# Patient Record
Sex: Male | Born: 1950 | Race: White | Hispanic: No | Marital: Married | Smoking: Former smoker
Health system: Southern US, Community
[De-identification: ages and names within clinical notes are randomized; demographics above are authoritative.]

## PROBLEM LIST (undated history)

## (undated) DIAGNOSIS — K922 Gastrointestinal hemorrhage, unspecified: Secondary | ICD-10-CM

## (undated) DIAGNOSIS — E039 Hypothyroidism, unspecified: Secondary | ICD-10-CM

## (undated) DIAGNOSIS — I10 Essential (primary) hypertension: Secondary | ICD-10-CM

## (undated) DIAGNOSIS — J449 Chronic obstructive pulmonary disease, unspecified: Secondary | ICD-10-CM

## (undated) DIAGNOSIS — E119 Type 2 diabetes mellitus without complications: Secondary | ICD-10-CM

## (undated) DIAGNOSIS — K659 Peritonitis, unspecified: Secondary | ICD-10-CM

## (undated) DIAGNOSIS — J939 Pneumothorax, unspecified: Secondary | ICD-10-CM

## (undated) DIAGNOSIS — I499 Cardiac arrhythmia, unspecified: Secondary | ICD-10-CM

## (undated) DIAGNOSIS — M329 Systemic lupus erythematosus, unspecified: Secondary | ICD-10-CM

## (undated) DIAGNOSIS — L089 Local infection of the skin and subcutaneous tissue, unspecified: Secondary | ICD-10-CM

## (undated) DIAGNOSIS — K7460 Unspecified cirrhosis of liver: Secondary | ICD-10-CM

## (undated) DIAGNOSIS — I82409 Acute embolism and thrombosis of unspecified deep veins of unspecified lower extremity: Secondary | ICD-10-CM

## (undated) DIAGNOSIS — E785 Hyperlipidemia, unspecified: Secondary | ICD-10-CM

## (undated) DIAGNOSIS — D649 Anemia, unspecified: Secondary | ICD-10-CM

## (undated) HISTORY — PX: APPENDECTOMY: SHX54

## (undated) HISTORY — PX: COLONOSCOPY: SHX174

## (undated) HISTORY — PX: HERNIA REPAIR: SHX51

---

## 2011-02-27 ENCOUNTER — Inpatient Hospital Stay: Payer: Self-pay | Admitting: Internal Medicine

## 2011-02-27 DIAGNOSIS — K922 Gastrointestinal hemorrhage, unspecified: Secondary | ICD-10-CM

## 2011-03-01 DIAGNOSIS — R000 Tachycardia, unspecified: Secondary | ICD-10-CM

## 2015-08-21 DIAGNOSIS — K219 Gastro-esophageal reflux disease without esophagitis: Secondary | ICD-10-CM | POA: Insufficient documentation

## 2015-08-21 DIAGNOSIS — F329 Major depressive disorder, single episode, unspecified: Secondary | ICD-10-CM | POA: Insufficient documentation

## 2015-08-21 DIAGNOSIS — M545 Low back pain, unspecified: Secondary | ICD-10-CM | POA: Insufficient documentation

## 2015-08-21 DIAGNOSIS — G8929 Other chronic pain: Secondary | ICD-10-CM | POA: Insufficient documentation

## 2015-08-21 DIAGNOSIS — F32A Depression, unspecified: Secondary | ICD-10-CM | POA: Insufficient documentation

## 2015-08-21 DIAGNOSIS — G250 Essential tremor: Secondary | ICD-10-CM | POA: Insufficient documentation

## 2016-03-26 DIAGNOSIS — Z0289 Encounter for other administrative examinations: Secondary | ICD-10-CM | POA: Insufficient documentation

## 2016-07-16 DIAGNOSIS — M5414 Radiculopathy, thoracic region: Secondary | ICD-10-CM | POA: Insufficient documentation

## 2016-07-16 DIAGNOSIS — F3341 Major depressive disorder, recurrent, in partial remission: Secondary | ICD-10-CM | POA: Insufficient documentation

## 2016-07-16 HISTORY — DX: Radiculopathy, thoracic region: M54.14

## 2017-02-01 ENCOUNTER — Encounter: Payer: Self-pay | Admitting: Emergency Medicine

## 2017-02-01 ENCOUNTER — Emergency Department
Admission: EM | Admit: 2017-02-01 | Discharge: 2017-02-01 | Disposition: A | Discharge status: Home / Self Care | Payer: No Typology Code available for payment source | Attending: Emergency Medicine | Admitting: Emergency Medicine

## 2017-02-01 ENCOUNTER — Emergency Department: Payer: No Typology Code available for payment source

## 2017-02-01 DIAGNOSIS — Y999 Unspecified external cause status: Secondary | ICD-10-CM | POA: Insufficient documentation

## 2017-02-01 DIAGNOSIS — Y92410 Unspecified street and highway as the place of occurrence of the external cause: Secondary | ICD-10-CM | POA: Diagnosis not present

## 2017-02-01 DIAGNOSIS — Y939 Activity, unspecified: Secondary | ICD-10-CM | POA: Diagnosis not present

## 2017-02-01 DIAGNOSIS — S161XXA Strain of muscle, fascia and tendon at neck level, initial encounter: Secondary | ICD-10-CM | POA: Insufficient documentation

## 2017-02-01 DIAGNOSIS — S63630A Sprain of interphalangeal joint of right index finger, initial encounter: Secondary | ICD-10-CM | POA: Diagnosis not present

## 2017-02-01 DIAGNOSIS — E119 Type 2 diabetes mellitus without complications: Secondary | ICD-10-CM | POA: Insufficient documentation

## 2017-02-01 DIAGNOSIS — J449 Chronic obstructive pulmonary disease, unspecified: Secondary | ICD-10-CM | POA: Insufficient documentation

## 2017-02-01 DIAGNOSIS — S199XXA Unspecified injury of neck, initial encounter: Secondary | ICD-10-CM | POA: Diagnosis present

## 2017-02-01 HISTORY — DX: Type 2 diabetes mellitus without complications: E11.9

## 2017-02-01 HISTORY — DX: Chronic obstructive pulmonary disease, unspecified: J44.9

## 2017-02-01 HISTORY — DX: Pneumothorax, unspecified: J93.9

## 2017-02-01 HISTORY — DX: Gastrointestinal hemorrhage, unspecified: K92.2

## 2017-02-01 MED ORDER — BACITRACIN ZINC 500 UNIT/GM EX OINT: TOPICAL_OINTMENT | Freq: Once | CUTANEOUS | Status: DC

## 2017-02-01 NOTE — ED Notes (Signed)
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
## 2017-07-26 DIAGNOSIS — M519 Unspecified thoracic, thoracolumbar and lumbosacral intervertebral disc disorder: Secondary | ICD-10-CM | POA: Insufficient documentation

## 2017-07-26 DIAGNOSIS — Z8619 Personal history of other infectious and parasitic diseases: Secondary | ICD-10-CM | POA: Insufficient documentation

## 2017-08-06 ENCOUNTER — Other Ambulatory Visit: Payer: Self-pay | Admitting: Internal Medicine

## 2017-08-06 DIAGNOSIS — M48061 Spinal stenosis, lumbar region without neurogenic claudication: Secondary | ICD-10-CM

## 2017-08-14 ENCOUNTER — Ambulatory Visit: Payer: PRIVATE HEALTH INSURANCE

## 2018-07-30 DIAGNOSIS — E1142 Type 2 diabetes mellitus with diabetic polyneuropathy: Secondary | ICD-10-CM

## 2018-08-29 ENCOUNTER — Emergency Department: Payer: Medicare Other

## 2018-08-29 ENCOUNTER — Other Ambulatory Visit: Payer: Self-pay

## 2018-08-29 ENCOUNTER — Inpatient Hospital Stay
Admission: EM | Admit: 2018-08-29 | Discharge: 2018-09-03 | DRG: 175 | Disposition: A | Discharge status: Home / Self Care | Payer: Medicare Other | Attending: Internal Medicine | Admitting: Internal Medicine

## 2018-08-29 DIAGNOSIS — Y906 Blood alcohol level of 120-199 mg/100 ml: Secondary | ICD-10-CM | POA: Diagnosis present

## 2018-08-29 DIAGNOSIS — K766 Portal hypertension: Secondary | ICD-10-CM | POA: Diagnosis present

## 2018-08-29 DIAGNOSIS — I82441 Acute embolism and thrombosis of right tibial vein: Secondary | ICD-10-CM | POA: Diagnosis present

## 2018-08-29 DIAGNOSIS — R7989 Other specified abnormal findings of blood chemistry: Secondary | ICD-10-CM | POA: Diagnosis present

## 2018-08-29 DIAGNOSIS — K7460 Unspecified cirrhosis of liver: Secondary | ICD-10-CM | POA: Diagnosis present

## 2018-08-29 DIAGNOSIS — W19XXXA Unspecified fall, initial encounter: Secondary | ICD-10-CM | POA: Diagnosis present

## 2018-08-29 DIAGNOSIS — I2510 Atherosclerotic heart disease of native coronary artery without angina pectoris: Secondary | ICD-10-CM | POA: Diagnosis present

## 2018-08-29 DIAGNOSIS — I6523 Occlusion and stenosis of bilateral carotid arteries: Secondary | ICD-10-CM | POA: Diagnosis present

## 2018-08-29 DIAGNOSIS — I82411 Acute embolism and thrombosis of right femoral vein: Secondary | ICD-10-CM | POA: Diagnosis present

## 2018-08-29 DIAGNOSIS — R296 Repeated falls: Secondary | ICD-10-CM | POA: Diagnosis present

## 2018-08-29 DIAGNOSIS — Z7982 Long term (current) use of aspirin: Secondary | ICD-10-CM

## 2018-08-29 DIAGNOSIS — J90 Pleural effusion, not elsewhere classified: Secondary | ICD-10-CM | POA: Diagnosis present

## 2018-08-29 DIAGNOSIS — I951 Orthostatic hypotension: Secondary | ICD-10-CM | POA: Diagnosis present

## 2018-08-29 DIAGNOSIS — Z79891 Long term (current) use of opiate analgesic: Secondary | ICD-10-CM

## 2018-08-29 DIAGNOSIS — I82431 Acute embolism and thrombosis of right popliteal vein: Secondary | ICD-10-CM | POA: Diagnosis present

## 2018-08-29 DIAGNOSIS — I480 Paroxysmal atrial fibrillation: Secondary | ICD-10-CM | POA: Diagnosis present

## 2018-08-29 DIAGNOSIS — E860 Dehydration: Secondary | ICD-10-CM | POA: Diagnosis present

## 2018-08-29 DIAGNOSIS — Z6841 Body Mass Index (BMI) 40.0 and over, adult: Secondary | ICD-10-CM

## 2018-08-29 DIAGNOSIS — Z7289 Other problems related to lifestyle: Secondary | ICD-10-CM

## 2018-08-29 DIAGNOSIS — I959 Hypotension, unspecified: Secondary | ICD-10-CM | POA: Diagnosis present

## 2018-08-29 DIAGNOSIS — R000 Tachycardia, unspecified: Secondary | ICD-10-CM

## 2018-08-29 DIAGNOSIS — Z79890 Hormone replacement therapy (postmenopausal): Secondary | ICD-10-CM

## 2018-08-29 DIAGNOSIS — R161 Splenomegaly, not elsewhere classified: Secondary | ICD-10-CM | POA: Diagnosis present

## 2018-08-29 DIAGNOSIS — Z79899 Other long term (current) drug therapy: Secondary | ICD-10-CM

## 2018-08-29 DIAGNOSIS — I2699 Other pulmonary embolism without acute cor pulmonale: Principal | ICD-10-CM | POA: Diagnosis present

## 2018-08-29 DIAGNOSIS — Z23 Encounter for immunization: Secondary | ICD-10-CM

## 2018-08-29 DIAGNOSIS — R55 Syncope and collapse: Secondary | ICD-10-CM | POA: Diagnosis present

## 2018-08-29 DIAGNOSIS — E1142 Type 2 diabetes mellitus with diabetic polyneuropathy: Secondary | ICD-10-CM

## 2018-08-29 DIAGNOSIS — E872 Acidosis: Secondary | ICD-10-CM | POA: Diagnosis present

## 2018-08-29 DIAGNOSIS — I824Y1 Acute embolism and thrombosis of unspecified deep veins of right proximal lower extremity: Secondary | ICD-10-CM

## 2018-08-29 DIAGNOSIS — J9601 Acute respiratory failure with hypoxia: Secondary | ICD-10-CM | POA: Diagnosis present

## 2018-08-29 DIAGNOSIS — J449 Chronic obstructive pulmonary disease, unspecified: Secondary | ICD-10-CM | POA: Diagnosis present

## 2018-08-29 DIAGNOSIS — J9811 Atelectasis: Secondary | ICD-10-CM | POA: Diagnosis present

## 2018-08-29 DIAGNOSIS — D696 Thrombocytopenia, unspecified: Secondary | ICD-10-CM

## 2018-08-29 DIAGNOSIS — D6959 Other secondary thrombocytopenia: Secondary | ICD-10-CM | POA: Diagnosis present

## 2018-08-29 DIAGNOSIS — Z789 Other specified health status: Secondary | ICD-10-CM

## 2018-08-29 DIAGNOSIS — Z9981 Dependence on supplemental oxygen: Secondary | ICD-10-CM

## 2018-08-29 DIAGNOSIS — Z87891 Personal history of nicotine dependence: Secondary | ICD-10-CM

## 2018-08-29 DIAGNOSIS — E039 Hypothyroidism, unspecified: Secondary | ICD-10-CM | POA: Diagnosis present

## 2018-08-29 DIAGNOSIS — E119 Type 2 diabetes mellitus without complications: Secondary | ICD-10-CM | POA: Diagnosis present

## 2018-08-29 DIAGNOSIS — Z794 Long term (current) use of insulin: Secondary | ICD-10-CM

## 2018-08-29 LAB — CBC
HCT: 39.7 % (ref 39.0–52.0)
HEMOGLOBIN: 13.2 g/dL (ref 13.0–17.0)
MCH: 31.8 pg (ref 26.0–34.0)
MCHC: 33.2 g/dL (ref 30.0–36.0)
MCV: 95.7 fL (ref 80.0–100.0)
NRBC: 0 % (ref 0.0–0.2)
Platelets: 97 10*3/uL — ABNORMAL LOW (ref 150–400)
RBC: 4.15 MIL/uL — ABNORMAL LOW (ref 4.22–5.81)
RDW: 14.5 % (ref 11.5–15.5)
WBC: 7.9 10*3/uL (ref 4.0–10.5)

## 2018-08-29 LAB — COMPREHENSIVE METABOLIC PANEL
ALBUMIN: 2.6 g/dL — AB (ref 3.5–5.0)
ALT: 18 U/L (ref 0–44)
AST: 47 U/L — ABNORMAL HIGH (ref 15–41)
Alkaline Phosphatase: 107 U/L (ref 38–126)
Anion gap: 11 (ref 5–15)
BUN: 13 mg/dL (ref 8–23)
CO2: 20 mmol/L — AB (ref 22–32)
Calcium: 7.9 mg/dL — ABNORMAL LOW (ref 8.9–10.3)
Chloride: 102 mmol/L (ref 98–111)
Creatinine, Ser: 0.88 mg/dL (ref 0.61–1.24)
GFR calc Af Amer: >60 mL/min (ref >60)
GFR calc non Af Amer: >60 mL/min (ref >60)
Glucose, Bld: 124 mg/dL — ABNORMAL HIGH (ref 70–99)
Potassium: 4.3 mmol/L (ref 3.5–5.1)
Sodium: 133 mmol/L — ABNORMAL LOW (ref 135–145)
Total Bilirubin: 1.5 mg/dL — ABNORMAL HIGH (ref 0.3–1.2)
Total Protein: 6 g/dL — ABNORMAL LOW (ref 6.5–8.1)

## 2018-08-29 LAB — LIPASE, BLOOD: Lipase: 39 U/L (ref 11–51)

## 2018-08-29 LAB — BLOOD GAS, VENOUS
Acid-base deficit: 3.1 mmol/L — ABNORMAL HIGH (ref 0.0–2.0)
Bicarbonate: 23.2 mmol/L (ref 20.0–28.0)
O2 Saturation: 91.6 %
PO2 VEN: 68 mmHg — AB (ref 32.0–45.0)
Patient temperature: 37
pCO2, Ven: 45 mmHg (ref 44.0–60.0)
pH, Ven: 7.32 (ref 7.250–7.430)

## 2018-08-29 LAB — LACTIC ACID, PLASMA: Lactic Acid, Venous: 2.6 mmol/L (ref 0.5–1.9)

## 2018-08-29 LAB — ETHANOL: Alcohol, Ethyl (B): 163 mg/dL — ABNORMAL HIGH (ref <10)

## 2018-08-29 LAB — TROPONIN I: Troponin I: <0.03 ng/mL (ref <0.03)

## 2018-08-29 MED ORDER — SODIUM CHLORIDE 0.9 % IV BOLUS
1000.0000 mL | Freq: Once | INTRAVENOUS | Status: AC
  Administered 2018-08-29: 1000 mL via INTRAVENOUS

## 2018-08-29 MED ORDER — SODIUM CHLORIDE 0.9 % IV BOLUS
500.0000 mL | Freq: Once | INTRAVENOUS | Status: AC
  Administered 2018-08-29: 500 mL via INTRAVENOUS

## 2018-08-29 MED ORDER — IOPAMIDOL (ISOVUE-300) INJECTION 61%
100.0000 mL | Freq: Once | INTRAVENOUS | Status: AC | PRN
  Administered 2018-08-29: 100 mL via INTRAVENOUS

## 2018-08-29 MED ORDER — ONDANSETRON HCL 4 MG/2ML IJ SOLN
4.0000 mg | Freq: Once | INTRAMUSCULAR | Status: AC
  Administered 2018-08-29: 4 mg via INTRAVENOUS
  Filled 2018-08-29: qty 2

## 2018-08-29 NOTE — ED Triage Notes (Addendum)
Pt arrived via [facility] from home with c/o fall and SOB. EMS states that pt started trulicity about a month ago. EMS states the last couple of days and fell down 4 times in the last couple of days. Pt states he has had some N/V occasionally.

## 2018-08-29 NOTE — ED Notes (Signed)
Patient transported to CT 

## 2018-08-29 NOTE — ED Provider Notes (Signed)
[facility] Emergency Department Provider Note  ____________________________________________  Time seen: Approximately 9:35 PM  I have reviewed the triage vital signs and the nursing notes.   HISTORY  Chief Complaint Fall and Shortness of Breath    HPI Lawrence Osborn is a 67 y.o. male with a history of COPD, not on oxygen, DM 2, presenting for 1 month of progressive decline with fall tonight.  The patient reports that for the past month, he has been feeling generalized weakness with daily nausea but no vomiting.  He occasionally has right upper quadrant pain that is unchanged with eating.  He has not had any diarrhea or constipation, urinary symptoms.  He has had a progressive decline with decreased exercise tolerance, including multiple falls due to bilateral lower extremities "giving out" on him.  Tonight, the patient had a fall, so came in for evaluation.  He denies any chest pain but has had shortness of breath.  No palpitations, lightheadedness or syncope.  He reports drinking 2 beers daily.  His wife also reports that he has slurred speech tonight.  Past Medical History:  Diagnosis Date  . COPD (chronic obstructive pulmonary disease) (HCC)   . Diabetes mellitus without complication (HCC)   . GI bleed   . Pneumothorax     There are no active problems to display for this patient.   Past Surgical History:  Procedure Laterality Date  . APPENDECTOMY    . HERNIA REPAIR        Allergies Patient has no known allergies.  History reviewed. No pertinent family history.  Social History Social History   Tobacco Use  . Smoking status: Never Smoker  . Smokeless tobacco: Never Used  Substance Use Topics  . Alcohol use: Yes    Comment: occ  . Drug use: Not on file    Review of Systems Constitutional: No fever/chills.  No lightheadedness or syncope.  Positive recurrent falls.  Positive general malaise. Eyes: No visual changes. ENT: No sore throat. No  congestion or rhinorrhea. Cardiovascular: Denies chest pain. Denies palpitations. Respiratory: Positive shortness of breath.  No cough. Gastrointestinal: Acid right upper quadrant abdominal pain.  Positive nausea, no vomiting.  No diarrhea.  No constipation. Genitourinary: Negative for dysuria. Musculoskeletal: Negative for back pain.  No neck pain. Skin: Negative for rash. Neurological: Negative for headaches. No focal numbness, tingling or weakness.  + slurred speech.    ____________________________________________   PHYSICAL EXAM:  VITAL SIGNS: ED Triage Vitals  Enc Vitals Group     BP 08/29/18 2121 (!) 85/58     Pulse Rate 08/29/18 2121 (!) 117     Resp 08/29/18 2121 20     Temp 08/29/18 2121 98.1 F (36.7 C)     Temp Source 08/29/18 2121 Oral     SpO2 08/29/18 2121 92 %     Weight 08/29/18 2122 252 lb (114.3 kg)     Height 08/29/18 2122 5\' 8"  (1.727 m)     Head Circumference --      Peak Flow --      Pain Score 08/29/18 2122 0     Pain Loc --      Pain Edu? --      Excl. in GC? --     Constitutional: Alert and oriented. Answers questions appropriately. Eyes: Conjunctivae are normal.  EOMI. positive scleral icterus. Head: Atraumatic. Nose: No congestion/rhinnorhea. Mouth/Throat: Mucous membranes are dry.  Neck: No stridor.  Supple.  No JVD.  No meningismus. Cardiovascular: Fast rate,  regular rhythm. No murmurs, rubs or gallops.  Hypotensive to 99 over 50s on my exam. Respiratory: Normal respiratory effort.  No accessory muscle use or retractions. Lungs CTAB.  No wheezes, rales or ronchi. Gastrointestinal: Obese.  Soft, and nondistended.  No tenderness to palpation, but the patient has a large palpable liver edge in the right upper quadrant.  No guarding or rebound.  No peritoneal signs. Musculoskeletal: No LE edema. No ttp in the calves or palpable cords.  Negative Homan's sign. Neurologic:  A&Ox3.  Speech is mildly slurred.  Face and smile are symmetric.  EOMI.   Moves all extremities well. Skin:  Skin is warm, dry and intact. No rash noted. Psychiatric: Mood and affect are normal. Speech and behavior are normal.  Normal judgement.  ____________________________________________   LABS (all labs ordered are listed, but only abnormal results are displayed)  Labs Reviewed  CBC  COMPREHENSIVE METABOLIC PANEL  TROPONIN I  URINALYSIS, COMPLETE (UACMP) WITH MICROSCOPIC  URINE DRUG SCREEN, QUALITATIVE ([facility] ONLY)  ETHANOL  LACTIC ACID, PLASMA  LACTIC ACID, PLASMA  LIPASE, BLOOD   ____________________________________________  EKG  ED ECG REPORT I, Anne-Caroline Sharma Covert, the attending physician, personally viewed and interpreted this ECG.   Date: 08/29/2018  EKG Time: 2112  Rate: 108  Rhythm: The computer reports this is atrial flutter; it is possible that this is sinus rhythm with a right bundle branch block.  Axis: leftward  Intervals:prolonged QTc  ST&T Change: No STEMI  ____________________________________________  RADIOLOGY  No results found.  ____________________________________________   PROCEDURES  Procedure(s) performed: None  Procedures  Critical Care performed: Yes ____________________________________________   INITIAL IMPRESSION / ASSESSMENT AND PLAN / ED COURSE  Pertinent labs & imaging results that were available during my care of the patient were reviewed by me and considered in my medical decision making (see chart for details).  67 y.o. male with a history of DM 2 and morbid obesity presenting for 1 month of progressive decline, with fall tonight.  There is no evidence for trauma from the patient's fall.  The patient has scleral icterus and mild jaundice, which is concerning for pancreatic or liver disease, and on exam he has a palpable liver edge.  The patient's EKG is also not completely normal, with either atrial flutter versus sinus rhythm with a right bundle.  I am concerned about malignancy in this patient  or an autoimmune process, although infection is possible the timeframe would be long for this.  A CT of the head and abdomen have been ordered, as well as a chest x-ray.  Basic laboratory studies, drug screens, are also pending.  The patient is hypotensive and tachycardic and will be treated with 2 L of intravenous fluids with ongoing monitoring and reevaluation.  Plan admission.  CRITICAL CARE Performed by: Rockne Menghini   Total critical care time: 35 minutes  Critical care time was exclusive of separately billable procedures and treating other patients.  Critical care was necessary to treat or prevent imminent or life-threatening deterioration.  Critical care was time spent personally by me on the following activities: development of treatment plan with patient and/or surrogate as well as nursing, discussions with consultants, evaluation of patient's response to treatment, examination of patient, obtaining history from patient or surrogate, ordering and performing treatments and interventions, ordering and review of laboratory studies, ordering and review of radiographic studies, pulse oximetry and re-evaluation of patient's condition.   ____________________________________________  FINAL CLINICAL IMPRESSION(S) / ED DIAGNOSES  Final diagnoses:  None  NEW MEDICATIONS STARTED DURING THIS VISIT:  New Prescriptions   No medications on file      Rockne Menghini, MD 08/29/18 2142

## 2018-08-30 ENCOUNTER — Observation Stay: Payer: Medicare Other

## 2018-08-30 ENCOUNTER — Observation Stay
Admit: 2018-08-30 | Discharge: 2018-08-30 | Disposition: A | Discharge status: Home / Self Care | Payer: Medicare Other | Attending: Internal Medicine | Admitting: Internal Medicine

## 2018-08-30 DIAGNOSIS — R55 Syncope and collapse: Secondary | ICD-10-CM | POA: Diagnosis present

## 2018-08-30 DIAGNOSIS — E119 Type 2 diabetes mellitus without complications: Secondary | ICD-10-CM

## 2018-08-30 DIAGNOSIS — E039 Hypothyroidism, unspecified: Secondary | ICD-10-CM | POA: Diagnosis present

## 2018-08-30 DIAGNOSIS — I2510 Atherosclerotic heart disease of native coronary artery without angina pectoris: Secondary | ICD-10-CM | POA: Diagnosis present

## 2018-08-30 DIAGNOSIS — I959 Hypotension, unspecified: Secondary | ICD-10-CM | POA: Diagnosis present

## 2018-08-30 DIAGNOSIS — Z789 Other specified health status: Secondary | ICD-10-CM

## 2018-08-30 DIAGNOSIS — Z7289 Other problems related to lifestyle: Secondary | ICD-10-CM

## 2018-08-30 DIAGNOSIS — J449 Chronic obstructive pulmonary disease, unspecified: Secondary | ICD-10-CM | POA: Insufficient documentation

## 2018-08-30 DIAGNOSIS — I480 Paroxysmal atrial fibrillation: Secondary | ICD-10-CM | POA: Diagnosis present

## 2018-08-30 HISTORY — DX: Syncope and collapse: R55

## 2018-08-30 LAB — CBC
HCT: 39.1 % (ref 39.0–52.0)
Hemoglobin: 12.5 g/dL — ABNORMAL LOW (ref 13.0–17.0)
MCH: 30.9 pg (ref 26.0–34.0)
MCHC: 32 g/dL (ref 30.0–36.0)
MCV: 96.8 fL (ref 80.0–100.0)
PLATELETS: 77 10*3/uL — AB (ref 150–400)
RBC: 4.04 MIL/uL — ABNORMAL LOW (ref 4.22–5.81)
RDW: 14.6 % (ref 11.5–15.5)
WBC: 4.4 10*3/uL (ref 4.0–10.5)
nRBC: 0 % (ref 0.0–0.2)

## 2018-08-30 LAB — URINALYSIS, COMPLETE (UACMP) WITH MICROSCOPIC
BILIRUBIN URINE: NEGATIVE
Bacteria, UA: NONE SEEN
Glucose, UA: NEGATIVE mg/dL
Hgb urine dipstick: NEGATIVE
Ketones, ur: NEGATIVE mg/dL
Leukocytes, UA: NEGATIVE
Nitrite: NEGATIVE
Protein, ur: NEGATIVE mg/dL
Specific Gravity, Urine: 1.018 (ref 1.005–1.030)
pH: 6 (ref 5.0–8.0)

## 2018-08-30 LAB — URINE DRUG SCREEN, QUALITATIVE (ARMC ONLY)
Amphetamines, Ur Screen: NOT DETECTED
BARBITURATES, UR SCREEN: NOT DETECTED
BENZODIAZEPINE, UR SCRN: NOT DETECTED
Cannabinoid 50 Ng, Ur ~~LOC~~: NOT DETECTED
Cocaine Metabolite,Ur ~~LOC~~: NOT DETECTED
MDMA (Ecstasy)Ur Screen: NOT DETECTED
Methadone Scn, Ur: NOT DETECTED
Opiate, Ur Screen: NOT DETECTED
Phencyclidine (PCP) Ur S: NOT DETECTED
Tricyclic, Ur Screen: POSITIVE — AB

## 2018-08-30 LAB — BASIC METABOLIC PANEL
ANION GAP: 8 (ref 5–15)
BUN: 11 mg/dL (ref 8–23)
CO2: 24 mmol/L (ref 22–32)
Calcium: 8 mg/dL — ABNORMAL LOW (ref 8.9–10.3)
Chloride: 106 mmol/L (ref 98–111)
Creatinine, Ser: 0.74 mg/dL (ref 0.61–1.24)
GFR calc Af Amer: >60 mL/min (ref >60)
GFR calc non Af Amer: >60 mL/min (ref >60)
Glucose, Bld: 105 mg/dL — ABNORMAL HIGH (ref 70–99)
Potassium: 4.4 mmol/L (ref 3.5–5.1)
Sodium: 138 mmol/L (ref 135–145)

## 2018-08-30 LAB — GLUCOSE, CAPILLARY
Glucose-Capillary: 71 mg/dL (ref 70–99)
Glucose-Capillary: 160 mg/dL — ABNORMAL HIGH (ref 70–99)
Glucose-Capillary: 117 mg/dL — ABNORMAL HIGH (ref 70–99)
Glucose-Capillary: 104 mg/dL — ABNORMAL HIGH (ref 70–99)
Glucose-Capillary: 135 mg/dL — ABNORMAL HIGH (ref 70–99)

## 2018-08-30 LAB — ECHOCARDIOGRAM COMPLETE
Height: 68 in
Weight: 4032 oz

## 2018-08-30 LAB — LACTIC ACID, PLASMA
LACTIC ACID, VENOUS: 2.2 mmol/L — AB (ref 0.5–1.9)
Lactic Acid, Venous: 3.1 mmol/L (ref 0.5–1.9)
Lactic Acid, Venous: 1.9 mmol/L (ref 0.5–1.9)
Lactic Acid, Venous: 1.5 mmol/L (ref 0.5–1.9)

## 2018-08-30 MED ORDER — OMEGA-3 FISH OIL 1000 MG PO CAPS: 1.0000 | ORAL_CAPSULE | Freq: Two times a day (BID) | ORAL | Status: DC

## 2018-08-30 MED ORDER — ONDANSETRON HCL 4 MG/2ML IJ SOLN: 4.0000 mg | Freq: Four times a day (QID) | INTRAMUSCULAR | Status: DC | PRN

## 2018-08-30 MED ORDER — TRAMADOL HCL 50 MG PO TABS
50.0000 mg | ORAL_TABLET | Freq: Two times a day (BID) | ORAL | Status: DC
  Administered 2018-09-03: 50 mg via ORAL
  Filled 2018-08-30 (×6): qty 1

## 2018-08-30 MED ORDER — OMEGA-3-ACID ETHYL ESTERS 1 G PO CAPS
1.0000 g | ORAL_CAPSULE | Freq: Two times a day (BID) | ORAL | Status: DC
  Administered 2018-08-30 – 2018-09-03 (×9): 1 g via ORAL
  Filled 2018-08-30 (×9): qty 1

## 2018-08-30 MED ORDER — SODIUM CHLORIDE 0.9 % IV SOLN: INTRAVENOUS | Status: DC

## 2018-08-30 MED ORDER — ORAL CARE MOUTH RINSE
15.0000 mL | Freq: Two times a day (BID) | OROMUCOSAL | Status: DC
  Administered 2018-08-30 – 2018-09-03 (×5): 15 mL via OROMUCOSAL

## 2018-08-30 MED ORDER — ACETAMINOPHEN 325 MG PO TABS: 650.0000 mg | ORAL_TABLET | Freq: Four times a day (QID) | ORAL | Status: DC | PRN

## 2018-08-30 MED ORDER — LORAZEPAM 2 MG PO TABS: 0.0000 mg | ORAL_TABLET | Freq: Two times a day (BID) | ORAL | Status: DC

## 2018-08-30 MED ORDER — FOLIC ACID 1 MG PO TABS
1.0000 mg | ORAL_TABLET | Freq: Every day | ORAL | Status: DC
  Administered 2018-08-30 – 2018-09-03 (×5): 1 mg via ORAL
  Filled 2018-08-30 (×5): qty 1

## 2018-08-30 MED ORDER — LORAZEPAM 1 MG PO TABS: 1.0000 mg | ORAL_TABLET | Freq: Four times a day (QID) | ORAL | Status: DC | PRN

## 2018-08-30 MED ORDER — DIGOXIN 125 MCG PO TABS
0.1250 mg | ORAL_TABLET | Freq: Every day | ORAL | Status: DC
  Administered 2018-08-30 – 2018-09-03 (×5): 0.125 mg via ORAL
  Filled 2018-08-30 (×6): qty 1

## 2018-08-30 MED ORDER — QUETIAPINE FUMARATE 25 MG PO TABS
100.0000 mg | ORAL_TABLET | Freq: Every morning | ORAL | Status: DC
  Administered 2018-08-31 – 2018-09-03 (×4): 100 mg via ORAL
  Filled 2018-08-30 (×4): qty 4

## 2018-08-30 MED ORDER — SODIUM CHLORIDE 0.9 % IV BOLUS: 1000.0000 mL | Freq: Once | INTRAVENOUS | Status: DC

## 2018-08-30 MED ORDER — QUETIAPINE FUMARATE 25 MG PO TABS
200.0000 mg | ORAL_TABLET | Freq: Every day | ORAL | Status: DC
  Administered 2018-08-30 – 2018-09-02 (×4): 200 mg via ORAL
  Filled 2018-08-30 (×4): qty 8

## 2018-08-30 MED ORDER — ONDANSETRON HCL 4 MG PO TABS: 4.0000 mg | ORAL_TABLET | Freq: Four times a day (QID) | ORAL | Status: DC | PRN

## 2018-08-30 MED ORDER — LORAZEPAM 2 MG/ML IJ SOLN: 1.0000 mg | Freq: Four times a day (QID) | INTRAMUSCULAR | Status: DC | PRN

## 2018-08-30 MED ORDER — NADOLOL 40 MG PO TABS
80.0000 mg | ORAL_TABLET | Freq: Two times a day (BID) | ORAL | Status: DC
  Administered 2018-08-30: 80 mg via ORAL
  Filled 2018-08-30 (×3): qty 2

## 2018-08-30 MED ORDER — ACETAMINOPHEN 650 MG RE SUPP: 650.0000 mg | Freq: Four times a day (QID) | RECTAL | Status: DC | PRN

## 2018-08-30 MED ORDER — LORAZEPAM 2 MG PO TABS: 0.0000 mg | ORAL_TABLET | Freq: Four times a day (QID) | ORAL | Status: DC

## 2018-08-30 MED ORDER — QUETIAPINE FUMARATE 25 MG PO TABS: 100.0000 mg | ORAL_TABLET | Freq: Two times a day (BID) | ORAL | Status: DC

## 2018-08-30 MED ORDER — INSULIN ASPART 100 UNIT/ML ~~LOC~~ SOLN
0.0000 [IU] | Freq: Every day | SUBCUTANEOUS | Status: DC
  Administered 2018-08-31: 2 [IU] via SUBCUTANEOUS
  Filled 2018-08-30: qty 1

## 2018-08-30 MED ORDER — SODIUM CHLORIDE 0.9 % IV BOLUS
500.0000 mL | Freq: Once | INTRAVENOUS | Status: AC
  Administered 2018-08-30: 500 mL via INTRAVENOUS

## 2018-08-30 MED ORDER — PANTOPRAZOLE SODIUM 40 MG PO TBEC
40.0000 mg | DELAYED_RELEASE_TABLET | Freq: Every day | ORAL | Status: DC
  Administered 2018-08-30 – 2018-09-03 (×5): 40 mg via ORAL
  Filled 2018-08-30 (×5): qty 1

## 2018-08-30 MED ORDER — ADULT MULTIVITAMIN W/MINERALS CH
1.0000 | ORAL_TABLET | Freq: Every day | ORAL | Status: DC
  Administered 2018-09-02 – 2018-09-03 (×2): 1 via ORAL
  Filled 2018-08-30 (×3): qty 1

## 2018-08-30 MED ORDER — ASPIRIN EC 81 MG PO TBEC
81.0000 mg | DELAYED_RELEASE_TABLET | Freq: Every day | ORAL | Status: DC
  Administered 2018-08-30 – 2018-09-02 (×4): 81 mg via ORAL
  Filled 2018-08-30 (×4): qty 1

## 2018-08-30 MED ORDER — ENOXAPARIN SODIUM 40 MG/0.4ML ~~LOC~~ SOLN
40.0000 mg | SUBCUTANEOUS | Status: DC
  Filled 2018-08-30: qty 0.4

## 2018-08-30 MED ORDER — PNEUMOCOCCAL VAC POLYVALENT 25 MCG/0.5ML IJ INJ
0.5000 mL | INJECTION | INTRAMUSCULAR | Status: AC
  Administered 2018-09-01: 0.5 mL via INTRAMUSCULAR
  Filled 2018-08-30: qty 0.5

## 2018-08-30 MED ORDER — LEVOTHYROXINE SODIUM 100 MCG PO TABS
100.0000 ug | ORAL_TABLET | Freq: Every day | ORAL | Status: DC
  Administered 2018-08-30 – 2018-09-03 (×5): 100 ug via ORAL
  Filled 2018-08-30 (×5): qty 1

## 2018-08-30 MED ORDER — VITAMIN B-1 100 MG PO TABS
100.0000 mg | ORAL_TABLET | Freq: Every day | ORAL | Status: DC
  Administered 2018-08-30 – 2018-09-02 (×4): 100 mg via ORAL
  Filled 2018-08-30 (×4): qty 1

## 2018-08-30 MED ORDER — INSULIN ASPART 100 UNIT/ML ~~LOC~~ SOLN
0.0000 [IU] | Freq: Three times a day (TID) | SUBCUTANEOUS | Status: DC
  Administered 2018-08-30 – 2018-08-31 (×2): 3 [IU] via SUBCUTANEOUS
  Administered 2018-08-31: 2 [IU] via SUBCUTANEOUS
  Administered 2018-09-01 (×2): 3 [IU] via SUBCUTANEOUS
  Administered 2018-09-02 (×3): 2 [IU] via SUBCUTANEOUS
  Administered 2018-09-03: 5 [IU] via SUBCUTANEOUS
  Filled 2018-08-30 (×9): qty 1

## 2018-08-30 MED ORDER — SODIUM CHLORIDE 0.9% FLUSH
3.0000 mL | Freq: Two times a day (BID) | INTRAVENOUS | Status: DC
  Administered 2018-08-30 – 2018-09-03 (×7): 3 mL via INTRAVENOUS

## 2018-08-30 MED ORDER — THIAMINE HCL 100 MG/ML IJ SOLN
100.0000 mg | Freq: Every day | INTRAMUSCULAR | Status: DC
  Administered 2018-09-03: 100 mg via INTRAVENOUS
  Filled 2018-08-30: qty 2

## 2018-08-30 NOTE — Progress Notes (Addendum)
The patient has no chest pain no shortness of breath, is on oxygen 5 L.  Heart rate is about 110s. Physical examination is unremarkable except irregular heart rate and rhythm with tachycardia.    Syncope -patient and his wife describe multiple episodes recently where he has fallen and had brief loss of consciousness or near loss of consciousness.  Troponin was negative.  Echocardiograph is unremarkable.  Carotid ultrasound is unremarkable.  The patient needs Holter monitor as outpatient per Dr. Lady GaryFath.    Hypotensive episode -likely dehydration, patient's blood pressure responded well to fluids and is currently normotensive.   Lactic acidosis.  Improved with IV fluid.     PAF (paroxysmal atrial fibrillation) with RVR Hold nadolol due to low side blood pressure.  Continue digoxin. Hold any antihypertensives for now.    Diabetes (HCC) -sliding scale insulin coverage   Alcohol use -elevated alcohol level in the ED tonight, CIWA protocol   CAD (coronary artery disease) -continue home meds, other work-up as above  Hypothyroidism -home dose thyroid replacement.  I discussed with the patient, his wife, Dr. Lady GaryFath and RN Time spent 26 minutes.

## 2018-08-30 NOTE — Progress Notes (Signed)
Discussed with Dr. Imogene Burnhen patient's bolus orders, stated he did not want the patient to have the bolus, lactic at 1.5. Patient refused continuous fluids.
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
## 2018-08-30 NOTE — ED Notes (Signed)
ED TO INPATIENT HANDOFF REPORT  Name/Age/Gender Lawrence Osborn 67 y.o. male  Code Status    Chief Complaint Hypotension  Level of Care/Admitting Diagnosis ED Disposition    ED Disposition Condition Comment   Admit  Hospital Area: [facility] [[id number]]  Level of Care: Telemetry [5]  Diagnosis: Syncope [206001]  Admitting Physician: Oralia Manis [[id number]]  Attending Physician: Oralia Manis [[id number]]  Bed request comments: 2a  PT Class (Do Not Modify): Observation [104]  PT Acc Code (Do Not Modify): Observation [10022]       Medical History Past Medical History:  Diagnosis Date  . COPD (chronic obstructive pulmonary disease) (HCC)   . Diabetes mellitus without complication (HCC)   . GI bleed   . Pneumothorax     Allergies No Known Allergies  IV Location/Drains/Wounds Patient Lines/Drains/Airways Status   Active Line/Drains/Airways    Name:   Placement date:   Placement time:   Site:   Days:   Peripheral IV 08/29/18 Left Hand   08/29/18    -    Hand   1          Labs/Imaging Results for orders placed or performed during the hospital encounter of 08/29/18 (from the past 48 hour(s))  CBC     Status: Abnormal   Collection Time: 08/29/18  9:28 PM  Result Value Ref Range   WBC 7.9 4.0 - 10.5 K/uL   RBC 4.15 (L) 4.22 - 5.81 MIL/uL   Hemoglobin 13.2 13.0 - 17.0 g/dL   HCT 62.9 52.8 - 41.3 %   MCV 95.7 80.0 - 100.0 fL   MCH 31.8 26.0 - 34.0 pg   MCHC 33.2 30.0 - 36.0 g/dL   RDW 24.4 01.0 - 27.2 %   Platelets 97 (L) 150 - 400 K/uL    Comment: SPECIMEN CHECKED FOR CLOTS Immature Platelet Fraction may be clinically indicated, consider ordering this additional test [id number]    nRBC 0.0 0.0 - 0.2 %    Comment: Performed at [facility], [address] [place], [place] [address]  Lactic acid, plasma     Status: Abnormal   Collection Time: 08/29/18 10:12 PM  Result Value Ref Range   Lactic Acid, Venous 2.6 (HH) 0.5 - 1.9 mmol/L     Comment: CRITICAL RESULT CALLED TO, READ BACK BY AND VERIFIED WITH COLE AMORIELLO ON 08/29/18 AT 2258 [facility] Performed at [facility], [address] [place], [place] [address]   Ethanol     Status: Abnormal   Collection Time: 08/29/18 10:12 PM  Result Value Ref Range   Alcohol, Ethyl (B) 163 (H) <10 mg/dL    Comment: (NOTE) Lowest detectable limit for serum alcohol is 10 mg/dL. For medical purposes only. Performed at [facility], [address] [place], [place] [address]   Blood gas, venous     Status: Abnormal   Collection Time: 08/29/18 10:12 PM  Result Value Ref Range   pH, Ven 7.32 7.250 - 7.430   pCO2, Ven 45 44.0 - 60.0 mmHg   pO2, Ven 68.0 (H) 32.0 - 45.0 mmHg   Bicarbonate 23.2 20.0 - 28.0 mmol/L   Acid-base deficit 3.1 (H) 0.0 - 2.0 mmol/L   O2 Saturation 91.6 %   Patient temperature 37.0    Collection site VENOUS    Sample type VENOUS     Comment: Performed at [facility], [address], [place], [place] [address]  Comprehensive metabolic panel     Status: Abnormal  Collection Time: 08/29/18 10:27 PM  Result Value Ref Range   Sodium 133 (L) 135 - 145 mmol/L   Potassium 4.3 3.5 - 5.1 mmol/L    Comment: HEMOLYSIS AT THIS LEVEL MAY AFFECT RESULT   Chloride 102 98 - 111 mmol/L   CO2 20 (L) 22 - 32 mmol/L   Glucose, Bld 124 (H) 70 - 99 mg/dL   BUN 13 8 - 23 mg/dL   Creatinine, Ser 1.610.88 0.61 - 1.24 mg/dL   Calcium 7.9 (L) 8.9 - 10.3 mg/dL   Total Protein 6.0 (L) 6.5 - 8.1 g/dL   Albumin 2.6 (L) 3.5 - 5.0 g/dL   AST 47 (H) 15 - 41 U/L   ALT 18 0 - 44 U/L   Alkaline Phosphatase 107 38 - 126 U/L   Total Bilirubin 1.5 (H) 0.3 - 1.2 mg/dL   GFR calc non Af Amer >60 >60 mL/min   GFR calc Af Amer >60 >60 mL/min   Anion gap 11 5 - 15    Comment: Performed at [facility], [address] [place], [place] [address]  Lipase, blood     Status: None   Collection Time: 08/29/18 10:27 PM  Result Value Ref Range   Lipase 39 11  - 51 U/L    Comment: Performed at [facility], [address] [place], [place] [address]  Troponin I -     Status: None   Collection Time: 08/29/18 10:27 PM  Result Value Ref Range   Troponin I <0.03 <0.03 ng/mL    Comment: Performed at [facility], [address] [place], [place] [address]  Lactic acid, plasma     Status: Abnormal   Collection Time: 08/30/18 12:54 AM  Result Value Ref Range   Lactic Acid, Venous 2.2 (HH) 0.5 - 1.9 mmol/L    Comment: CRITICAL RESULT CALLED TO, READ BACK BY AND VERIFIED WITH COLE AMORIELLO ON 08/30/18 AT 0149 [facility] Performed at [facility], [address] [place], [place] [address]   Urinalysis, Complete w Microscopic     Status: Abnormal   Collection Time: 08/30/18  1:10 AM  Result Value Ref Range   Color, Urine YELLOW (A) YELLOW   APPearance CLEAR (A) CLEAR   Specific Gravity, Urine 1.018 1.005 - 1.030   pH 6.0 5.0 - 8.0   Glucose, UA NEGATIVE NEGATIVE mg/dL   Hgb urine dipstick NEGATIVE NEGATIVE   Bilirubin Urine NEGATIVE NEGATIVE   Ketones, ur NEGATIVE NEGATIVE mg/dL   Protein, ur NEGATIVE NEGATIVE mg/dL   Nitrite NEGATIVE NEGATIVE   Leukocytes, UA NEGATIVE NEGATIVE   RBC / HPF 0-5 0 - 5 RBC/hpf   WBC, UA 0-5 0 - 5 WBC/hpf   Bacteria, UA NONE SEEN NONE SEEN   Squamous Epithelial / LPF 0-5 0 - 5    Comment: Performed at [facility], [address] [place], [place] [address]  Urine Drug Screen, Qualitative ([facility] only)     Status: Abnormal   Collection Time: 08/30/18  1:10 AM  Result Value Ref Range   Tricyclic, Ur Screen POSITIVE (A) NONE DETECTED   Amphetamines, Ur Screen NONE DETECTED NONE DETECTED   MDMA (Ecstasy)Ur Screen NONE DETECTED NONE DETECTED   Cocaine Metabolite,Ur [place] NONE DETECTED NONE DETECTED   Opiate, Ur Screen NONE DETECTED NONE DETECTED   Phencyclidine (PCP) Ur S NONE DETECTED NONE DETECTED   Cannabinoid 50 Ng, Ur [place] NONE DETECTED NONE DETECTED   Barbiturates, Ur Screen NONE  DETECTED NONE DETECTED  Benzodiazepine, Ur Scrn NONE DETECTED NONE DETECTED   Methadone Scn, Ur NONE DETECTED NONE DETECTED    Comment: (NOTE) Tricyclics + metabolites, urine    Cutoff 1000 ng/mL Amphetamines + metabolites, urine  Cutoff 1000 ng/mL MDMA (Ecstasy), urine              Cutoff 500 ng/mL Cocaine Metabolite, urine          Cutoff 300 ng/mL Opiate + metabolites, urine        Cutoff 300 ng/mL Phencyclidine (PCP), urine         Cutoff 25 ng/mL Cannabinoid, urine                 Cutoff 50 ng/mL Barbiturates + metabolites, urine  Cutoff 200 ng/mL Benzodiazepine, urine              Cutoff 200 ng/mL Methadone, urine                   Cutoff 300 ng/mL The urine drug screen provides only a preliminary, unconfirmed analytical test result and should not be used for non-medical purposes. Clinical consideration and professional judgment should be applied to any positive drug screen result due to possible interfering substances. A more specific alternate chemical method must be used in order to obtain a confirmed analytical result. Gas chromatography / mass spectrometry (GC/MS) is the preferred confirmat ory method. Performed at [facility], [address] [place] [place], [place] [address]    Dg Chest 2 View  Result Date: 08/29/2018 CLINICAL DATA:  Fall. Shortness breath. Nausea and vomiting. EXAM: CHEST - 2 VIEW COMPARISON:  02/27/2011 FINDINGS: Moderate enlargement of the cardiopericardial silhouette. Atherosclerotic calcification of the aortic arch. No edema. Thoracic spondylosis. No blunting of the costophrenic angles to suggest pleural effusion. IMPRESSION: 1. Moderate enlargement of the cardiopericardial silhouette, without edema. 2.  Aortic Atherosclerosis (ICD10-I70.0). Electronically Signed   By: Gaylyn Rong M.D.   On: 08/29/2018 22:21   Ct Head Wo Contrast  Result Date: 08/29/2018 CLINICAL DATA:  Slurred speech. Fall tonight. Diabetes. Weakness. EXAM: CT HEAD  WITHOUT CONTRAST TECHNIQUE: Contiguous axial images were obtained from the base of the skull through the vertex without intravenous contrast. COMPARISON:  None. FINDINGS: Brain: The brainstem, cerebellum, cerebral peduncles, thalami, basal ganglia, basilar cisterns, and ventricular system appear within normal limits. No intracranial hemorrhage, mass lesion, or acute CVA. Vascular: There is atherosclerotic calcification of the cavernous carotid arteries bilaterally. Skull: Unremarkable Sinuses/Orbits: Unremarkable Other: Unremarkable IMPRESSION: 1. No acute intracranial findings. 2. Atherosclerosis. Electronically Signed   By: Gaylyn Rong M.D.   On: 08/29/2018 23:42   Ct Abdomen Pelvis W Contrast  Result Date: 08/29/2018 CLINICAL DATA:  Weakness. Right upper quadrant abdominal pain. Slurred speech. EXAM: CT ABDOMEN AND PELVIS WITH CONTRAST TECHNIQUE: Multidetector CT imaging of the abdomen and pelvis was performed using the standard protocol following bolus administration of intravenous contrast. CONTRAST:  ISOVUE-300 IOPAMIDOL (ISOVUE-300) INJECTION 61% COMPARISON:  Abdominal ultrasound 02/28/2011 FINDINGS: Lower chest: Right heart enlargement. Small left pleural effusion with adjacent atelectasis. Descending aortic atherosclerotic vascular calcification. Hepatobiliary: Nodular hepatic contour compatible cirrhosis. Gallstones layering in the gallbladder with indistinct and potentially mildly thickened gallbladder wall. Pancreas: Mild indistinctness of pancreatic margin is probably incidental although could be a manifestation of pancreatitis. This is more notable in the pancreatic tail. Spleen: The spleen measures 15.8 by 8.3 by 17.4 cm (volume = 1200 cm^3). No focal splenic lesion is identified. Adrenals/Urinary Tract: An exophytic 9 mm lesion from the  right mid kidney measures 29 Hounsfield units on portal venous phase and 28 Hounsfield units on delayed phase images, the lack of significant change  probably indicates a lack of de-enhancement which would favor a complex but benign renal cyst. 2 mm left mid upper kidney nonobstructive renal calculus, image 69/5. Bilateral perirenal stranding. 1.4 by 1.1 cm left mid kidney fluid density lesion posteriorly compatible with cyst. Stomach/Bowel: No dilated bowel identified. Appendix surgically absent. Vascular/Lymphatic: Splenorenal shunting. Recanalized umbilical vein. 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 Accentuated vasculature in the omentum. Patent portal vein. Aortoiliac atherosclerotic vascular disease. 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 Reactive lymph nodes in the porta hepatis. Reproductive: Punctate calcification in the right prostate gland, otherwise unremarkable. Other: Trace fluid along the paracolic gutters. Musculoskeletal: Unremarkable IMPRESSION: 1. Hepatic cirrhosis with portal venous hypertension, recanalized umbilical vein, and splenorenal shunting 2. Mild indistinctness of the pancreatic margin is probably incidental, less likely to be due to pancreatitis. Correlate with lipase levels. 3. Cholelithiasis. 4. Small left pleural effusion with adjacent atelectasis. 5. 2 mm left mid upper kidney nonobstructive renal calculus. 6. Aortoiliac atherosclerotic vascular disease. 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 7. Right heart enlargement. 8. Splenomegaly. 9. 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 Electronically Signed   By: Gaylyn Rong M.D.   On: 08/29/2018 23:52    Pending Labs [employer] (From admission, onward)    Start     Ordered   Signed and Held  HIV antibody (Routine Testing)  Once,   R     Signed and Held   Signed and Held  CBC  (enoxaparin (LOVENOX)    CrCl >/= 30 ml/min)  Once,   R    Comments:  Baseline for enoxaparin therapy IF NOT ALREADY DRAWN.  Notify MD if PLT < 100 K.    Signed and  Held   Signed and Held  Creatinine, serum  (enoxaparin (LOVENOX)    CrCl >/= 30 ml/min)  Once,   R    Comments:  Baseline for enoxaparin therapy IF NOT ALREADY DRAWN.    Signed and Held   Signed and Held  Creatinine, serum  (enoxaparin (LOVENOX)    CrCl >/= 30 ml/min)  Weekly,   R    Comments:  while on enoxaparin therapy    Signed and Held   Signed and Held  Basic metabolic panel  Tomorrow morning,   R     Signed and Held   Signed and Held  CBC  Tomorrow morning,   R     Signed and Held          Vitals/Pain Today's Vitals   08/30/18 0045 08/30/18 0115 08/30/18 0130 08/30/18 0145  BP: (!) 116/99 102/79 111/80 118/87  Pulse: (!) 106 (!) 107 (!) 108 (!) 109  Resp: 16 16 18 18   Temp:      TempSrc:      SpO2: 93% 96% 96% 96%  Weight:      Height:      PainSc:        Isolation Precautions No active isolations  Medications Medications  sodium chloride 0.9 % bolus 1,000 mL (0 mLs Intravenous Stopped 08/29/18 2312)  ondansetron (ZOFRAN) injection 4 mg (4 mg Intravenous Given 08/29/18 2210)  iopamidol (ISOVUE-300) 61 % injection 100 mL (100 mLs Intravenous Contrast Given 08/29/18 2311)  sodium chloride 0.9 % bolus 1,000 mL (  0 mLs Intravenous Stopped 08/30/18 0020)  sodium chloride 0.9 % bolus 500 mL (0 mLs Intravenous Stopped 08/30/18 0056)  sodium chloride 0.9 % bolus 500 mL (0 mLs Intravenous Stopped 08/30/18 0126)

## 2018-08-30 NOTE — ED Notes (Addendum)
Date and time results received: 08/29/2018 2258  Test: Lactic Acid Critical Value: Lactic Acid: 2.6   Name of Provider Notified: Dr Sharma CovertNorman  Orders Received? Or Actions Taken?: Acknowledged

## 2018-08-30 NOTE — Plan of Care (Signed)
  Problem: Education: Goal: Knowledge of General Education information will improve Description: Including pain rating scale, medication(s)/side effects and non-pharmacologic comfort measures Outcome: Progressing   Problem: Safety: Goal: Ability to remain free from injury will improve Outcome: Progressing   

## 2018-08-30 NOTE — H&P (Signed)
[facility] [place] [facility]   PATIENT NAME: Lawrence Osborn    MR#:  [id number]  DATE OF BIRTH:  18-Jun-1951  DATE OF ADMISSION:  08/29/2018  PRIMARY CARE PHYSICIAN: Danella Penton, MD   REQUESTING/REFERRING PHYSICIAN: Sharma Covert, MD  CHIEF COMPLAINT:   Chief Complaint  Patient presents with  . Fall  . Shortness of Breath    HISTORY OF PRESENT ILLNESS:  Lawrence Osborn  is a 67 y.o. male who presents with chief complaint as above.  Patient presents with history of multiple recent falls.  Patient describes these events as syncopal and near syncopal events.  He has a history of paroxysmal A. fib.  On presentation to the ED today he was hypotensive, though his blood pressure improved significantly with IV fluids.  Hospitalist were called for admission and further evaluation  PAST MEDICAL HISTORY:   Past Medical History:  Diagnosis Date  . COPD (chronic obstructive pulmonary disease) (HCC)   . Diabetes mellitus without complication (HCC)   . GI bleed   . Pneumothorax      PAST SURGICAL HISTORY:   Past Surgical History:  Procedure Laterality Date  . APPENDECTOMY    . HERNIA REPAIR       SOCIAL HISTORY:   Social History   Tobacco Use  . Smoking status: Never Smoker  . Smokeless tobacco: Never Used  Substance Use Topics  . Alcohol use: Yes    Comment: occ     FAMILY HISTORY:  Family history reviewed and is noncontributory   DRUG ALLERGIES:  No Known Allergies  MEDICATIONS AT HOME:   Prior to Admission medications   Medication Sig Start Date End Date Taking? Authorizing Provider  albuterol (PROVENTIL HFA;VENTOLIN HFA) 108 (90 Base) MCG/ACT inhaler Inhale 2 puffs into the lungs every 6 (six) hours as needed for wheezing or shortness of breath.   Yes [provider]  aspirin EC 81 MG tablet Take 81 mg by mouth daily.   Yes [provider]  digoxin (LANOXIN) 0.125 MG tablet Take 0.125 mg by mouth daily.   Yes [provider]  Dulaglutide (TRULICITY) 0.75 MG/0.5ML SOPN Inject 0.75 mg into the skin every Sunday.   Yes [provider]  Insulin Glargine (BASAGLAR KWIKPEN) 100 UNIT/ML SOPN Inject 30 Units into the skin 2 (two) times daily.   Yes [provider]  insulin lispro (HUMALOG KWIKPEN) 100 UNIT/ML KwikPen Inject 50 Units into the skin 3 (three) times daily.   Yes [provider]  levothyroxine (SYNTHROID, LEVOTHROID) 100 MCG tablet Take 100 mcg by mouth daily before breakfast.   Yes [provider]  nadolol (CORGARD) 40 MG tablet Take 80 mg by mouth 2 (two) times daily.   Yes [provider]  omega-3 fish oil (MAXEPA) 1000 MG CAPS capsule Take 1 capsule by mouth 2 (two) times daily.   Yes [provider]  omeprazole (PRILOSEC) 20 MG capsule Take 20 mg by mouth daily.   Yes [provider]  QUEtiapine (SEROQUEL) 100 MG tablet Take 100-200 mg by mouth 2 (two) times daily. 100 mg in the morning and 200 mg at bedtime   Yes [provider]  ST JOHNS WORT PO Take 1 tablet by mouth 2 (two) times daily.   Yes [provider]  traMADol (ULTRAM) 50 MG tablet Take 50 mg by mouth 2 (two) times daily.   Yes [provider]    REVIEW OF SYSTEMS:  Review of Systems  Constitutional:  Negative for chills, fever, malaise/fatigue and weight loss.  HENT: Negative for ear pain, hearing loss and tinnitus.   Eyes: Negative for blurred vision, double vision, pain and redness.  Respiratory: Negative for cough, hemoptysis and shortness of breath.   Cardiovascular: Negative for chest pain, palpitations, orthopnea and leg swelling.  Gastrointestinal: Negative for abdominal pain, constipation, diarrhea, nausea and vomiting.  Genitourinary: Negative for dysuria, frequency and hematuria.  Musculoskeletal: Positive for falls. Negative for back pain, joint pain and neck pain.  Skin:       No acne, rash, or lesions  Neurological: Positive  for loss of consciousness. Negative for dizziness, tremors, focal weakness and weakness.  Endo/Heme/Allergies: Negative for polydipsia. Does not bruise/bleed easily.  Psychiatric/Behavioral: Negative for depression. The patient is not nervous/anxious and does not have insomnia.      VITAL SIGNS:   Vitals:   08/30/18 0030 08/30/18 0045 08/30/18 0115 08/30/18 0130  BP: 101/72 (!) 116/99 102/79 111/80  Pulse: (!) 111 (!) 106 (!) 107 (!) 108  Resp: 14 16 16 18   Temp:      TempSrc:      SpO2: 91% 93% 96% 96%  Weight:      Height:       Wt Readings from Last 3 Encounters:  08/29/18 114.3 kg  02/01/17 108.9 kg    PHYSICAL EXAMINATION:  Physical Exam  Vitals reviewed. Constitutional: He is oriented to person, place, and time. He appears well-developed and well-nourished. No distress.  HENT:  Head: Normocephalic and atraumatic.  Mouth/Throat: Oropharynx is clear and moist.  Eyes: Pupils are equal, round, and reactive to light. Conjunctivae and EOM are normal. No scleral icterus.  Neck: Normal range of motion. Neck supple. No JVD present. No thyromegaly present.  Cardiovascular: Regular rhythm and intact distal pulses. Exam reveals no gallop and no friction rub.  No murmur heard. Tachycardic  Respiratory: Effort normal and breath sounds normal. No respiratory distress. He has no wheezes. He has no rales.  GI: Soft. Bowel sounds are normal. He exhibits no distension. There is no abdominal tenderness.  Musculoskeletal: Normal range of motion.        General: No edema.     Comments: No arthritis, no gout  Lymphadenopathy:    He has no cervical adenopathy.  Neurological: He is alert and oriented to person, place, and time. No cranial nerve deficit.  No dysarthria, no aphasia  Skin: Skin is warm and dry. No rash noted. No erythema.  Psychiatric: He has a normal mood and affect. His behavior is normal. Judgment and thought content normal.    LABORATORY PANEL:   CBC Recent Labs   Lab 08/29/18 2128  WBC 7.9  HGB 13.2  HCT 39.7  PLT 97*   ------------------------------------------------------------------------------------------------------------------  Chemistries  Recent Labs  Lab 08/29/18 2227  NA 133*  K 4.3  CL 102  CO2 20*  GLUCOSE 124*  BUN 13  CREATININE 0.88  CALCIUM 7.9*  AST 47*  ALT 18  ALKPHOS 107  BILITOT 1.5*   ------------------------------------------------------------------------------------------------------------------  Cardiac Enzymes Recent Labs  Lab 08/29/18 2227  TROPONINI <0.03   ------------------------------------------------------------------------------------------------------------------  RADIOLOGY:  Dg Chest 2 View  Result Date: 08/29/2018 CLINICAL DATA:  Fall. Shortness breath. Nausea and vomiting. EXAM: CHEST - 2 VIEW COMPARISON:  02/27/2011 FINDINGS: Moderate enlargement of the cardiopericardial silhouette. Atherosclerotic calcification of the aortic arch. No edema. Thoracic spondylosis. No blunting of the costophrenic angles to suggest pleural effusion. IMPRESSION: 1. Moderate enlargement of the cardiopericardial silhouette, without edema.  2.  Aortic Atherosclerosis (ICD10-I70.0). Electronically Signed   By: Walter  Liebkemann M.D.   On: 08/29/2018 22:21   Ct Head Wo Contrast  Result Date: 08/29/2018 CLINICAL DATA:  Slurred speech. Fall tonight. Diabetes. Weakness. EXAM: CT HEAD WITHOUT CONTRAST TECHNIQUE: Contiguous axial images were obtained from the base of the skull through the vertex without intravenous contrast. COMPARISON:  None. FINDINGS: Brain: The brainstem, cerebellum, cerebral peduncles, thalami, basal ganglia, basilar cisterns, and ventricular system appear within normal limits. No intracranial hemorrhage, mass lesion, or acute CVA. Vascular: There is atherosclerotic calcification of the cavernous carotid arteries bilaterally. Skull: Unremarkable Sinuses/Orbits: Unremarkable Other: Unremarkable  IMPRESSION: 1. No acute intracranial findings. 2. Atherosclerosis. Electronically Signed   By: Walter  Liebkemann M.D.   On: 08/29/2018 23:42   Ct Abdomen Pelvis W Contrast  Result Date: 08/29/2018 CLINICAL DATA:  Weakness. Right upper quadrant abdominal pain. Slurred speech. EXAM: CT ABDOMEN AND PELVIS WITH CONTRAST TECHNIQUE: Multidetector CT imaging of the abdomen and pelvis was performed using the standard protocol follAlcLeaAlcLear 7CoMAlcLear 7AlcLAlcLear 97CoMo6 [place]cLear 57CoMo16 S. BrKentuckyAlcLear 49CoAlcLear 76CoMo757 MarKentuckykTriAlcLear 36CoMo88AlcLear 78CoMo307AlcLAlcLear 96CoMoAlcLear 39CoMo29AlcLear 4AAlcLear 33CoMo472 East GaiKeAlcLear 21CoAlcLear 71CoMoAlcLear 13CoMo937 NAlcLear 26CoMo76 Shadow BKentuckyrWashington [id number] [id number] nMarland Kitch43en INGS: Lower chest: Right heart enlargement. Small left pleural effusion with adjacent atelectasis. Descending aortic atherosclerotic vascular calcification. Hepatobiliary: Nodular hepatic contour compatible cirrhosis. Gallstones layering in the gallbladder with indistinct and potentially mildly thickened gallbladder wall. Pancreas: Mild indistinctness of pancreatic margin is probably incidental although could be a manifestation of pancreatitis. This is more notable in the pancreatic tail. Spleen: The spleen measures 15.8 by 8.3 by 17.4 cm (volume = 1200 cm^3). No focal splenic lesion is identified. Adrenals/Urinary Tract: An exophytic 9 mm lesion from the right mid kidney measures 29 Hounsfield units on portal venous phase and 28 Hounsfield units on delayed phase images, the lack of significant change probably indicates a lack of de-enhancement which would favor a complex but benign renal cyst. 2 mm left mid upper kidney nonobstructive renal calculus, image 69/5. Bilateral perirenal stranding. 1.4 by 1.1 cm left mid kidney fluid density lesion posteriorly compatible with cyst. Stomach/Bowel: No dilated bowel identified. Appendix surgically absent. Vascular/Lymphatic: Splenorenal shunting. Recanalized umbilical vein. 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 Accentuated vasculature in the omentum.  Patent portal vein. Aortoiliac atherosclerotic vascular disease. 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 Reactive lymph nodes in the porta hepatis. Reproductive: Punctate calcification in the right prostate gland, otherwise unremarkable. Other: Trace fluid along the paracolic gutters. Musculoskeletal: Unremarkable IMPRESSION: 1. Hepatic cirrhosis with portal venous hypertension, recanalized umbilical vein, and splenorenal shunting 2. Mild indistinctness of the pancreatic margin is probably incidental, less likely to be due to pancreatitis. Correlate with lipase levels. 3. Cholelithiasis. 4. Small left pleural effusion with adjacent atelectasis. 5. 2 mm left mid upper kidney nonobstructive renal calculus. 6. Aortoiliac atherosclerotic vascular disease. 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 7. Right heart enlargement. 8. Splenomegaly. 9. 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 Electronically Signed   By: Walter  Liebkemann M.D.   On: 08/29/2018 23:52    EKG:   Orders placed or performed during the hospital encounter of 08/29/18  . ED EKG  . ED EKG  . ED EKG  . ED EKG  . EKG 12-Lead  . EKG 12-Lead    IMPRESSION AND PLAN:  Principal Problem:   Syncope -patient and his wife describe multiple episodes recently where he has fallen and had brief loss of consciousness  or near loss of consciousness.  Troponin was negative.  We will get an echocardiogram, and a cardiology consult for further evaluation Active Problems:   Hypotensive episode -likely dehydration, patient's blood pressure responded well to fluids and is currently normotensive.  We will hold any antihypertensives for now   Diabetes (HCC) -sliding scale insulin coverage   Alcohol use -elevated alcohol level in the ED tonight, CIWA protocol   CAD (coronary artery disease) -continue home meds, other work-up as above   PAF (paroxysmal atrial fibrillation)  (HCC) -continue rate controlling medications   Hypothyroidism -home dose thyroid replacement  Chart review performed and case discussed with ED provider. Labs, imaging and/or ECG reviewed by provider and discussed with patient/family. Management plans discussed with the patient and/or family.  DVT PROPHYLAXIS: SubQ lovenox   GI PROPHYLAXIS:  PPI   ADMISSION STATUS: Observation  CODE STATUS: Full  TOTAL TIME TAKING CARE OF THIS PATIENT: 40 minutes.   Anne Hahn, Atleigh Gruen FIELDING 08/30/2018, 1:42 AM  [employer]  [phone]  CC: Primary care physician; Danella Penton, MD  Note:  This document was prepared using Dragon voice recognition software and may include unintentional dictation errors.

## 2018-08-30 NOTE — Consult Note (Addendum)
Cardiology Consultation Note    Patient ID: Lawrence Osborn, MRN: [id number], DOB/AGE: January 05, 1951 67 y.o. Admit date: 08/29/2018   Date of Consult: 08/30/2018 Primary Physician: Danella Penton, MD Primary Cardiologist: none  Chief Complaint: syncope Reason for Consultation: syncope Requesting MD: Dr. Imogene Burn  HPI: Lawrence Osborn is a 67 y.o. male with history of COPD, diabetes who was admitted after 1 month history progressive weakness and fatigue with daily nausea.  He states his lower extremities have been giving out.  He had a fall the night causing him to present to the emergency room.  He denied any chest pain but complained of shortness of breath.  .  Urine drug screen positive for tricyclics.  Ethanol level was markedly elevated at 163.  He has ruled out for myocardial infarction.  Electrocardiogram showed probable sinus tachycardia.  Baseline artifact however limited sensitivity.  Serum sodium was low at 133.  Bilirubin is 1.5.  Creatinine was 0.88.  He had a lot of anorexia over the past several days.  He was hypotensive on presentation.  He has a history of paroxysmal A. fib electrocardiogram was somewhat unclear as to whether he had transient A. fib versus sinus tachycardia.  Currently in sinus rhythm.  Blood pressure improved with volume repletion.  Patient is on aspirin, digoxin, nadolol 40 mg 2 tablets twice daily.  Patient states she has a history of transient atrial fibrillation but has not noted recent dysrhythmia.  He states he drinks 2 beers a day.  He has frequent lightheaded episodes but blames this on Trulicity.  He does not always have syncope but has frequent falls which his wife states he is less alert when these episodes occur.  Echocardiogram done today revealed preserved LV function with no high-grade valvular disease.  Right ventricle right atrium are mildly enlarged.  He has mildly elevated right-sided pressures.  Past Medical History:  Diagnosis Date  . COPD (chronic  obstructive pulmonary disease) (HCC)   . Diabetes mellitus without complication (HCC)   . GI bleed   . Pneumothorax       Surgical History:  Past Surgical History:  Procedure Laterality Date  . APPENDECTOMY    . HERNIA REPAIR       Home Meds: Prior to Admission medications   Medication Sig Start Date End Date Taking? Authorizing Provider  albuterol (PROVENTIL HFA;VENTOLIN HFA) 108 (90 Base) MCG/ACT inhaler Inhale 2 puffs into the lungs every 6 (six) hours as needed for wheezing or shortness of breath.   Yes [provider]  aspirin EC 81 MG tablet Take 81 mg by mouth daily.   Yes [provider]  digoxin (LANOXIN) 0.125 MG tablet Take 0.125 mg by mouth daily.   Yes [provider]  Dulaglutide (TRULICITY) 0.75 MG/0.5ML SOPN Inject 0.75 mg into the skin every Sunday.   Yes [provider]  Insulin Glargine (BASAGLAR KWIKPEN) 100 UNIT/ML SOPN Inject 30 Units into the skin 2 (two) times daily.   Yes [provider]  insulin lispro (HUMALOG KWIKPEN) 100 UNIT/ML KwikPen Inject 50 Units into the skin 3 (three) times daily.   Yes [provider]  levothyroxine (SYNTHROID, LEVOTHROID) 100 MCG tablet Take 100 mcg by mouth daily before breakfast.   Yes [provider]  nadolol (CORGARD) 40 MG tablet Take 80 mg by mouth 2 (two) times daily.   Yes [provider]  omega-3 fish oil (MAXEPA) 1000 MG CAPS capsule Take 1 capsule by mouth 2 (two) times daily.  Yes [provider]  omeprazole (PRILOSEC) 20 MG capsule Take 20 mg by mouth daily.   Yes [provider]  QUEtiapine (SEROQUEL) 100 MG tablet Take 100-200 mg by mouth 2 (two) times daily. 100 mg in the morning and 200 mg at bedtime   Yes [provider]  ST JOHNS WORT PO Take 1 tablet by mouth 2 (two) times daily.   Yes [provider]  traMADol (ULTRAM) 50 MG tablet Take 50 mg by mouth 2 (two) times daily.   Yes [provider]     Inpatient Medications:  . aspirin EC  81 mg Oral Daily  . digoxin  0.125 mg Oral Daily  . enoxaparin (LOVENOX) injection  40 mg Subcutaneous Q24H  . folic acid  1 mg Oral Daily  . insulin aspart  0-15 Units Subcutaneous TID WC  . insulin aspart  0-5 Units Subcutaneous QHS  . levothyroxine  100 mcg Oral QAC breakfast  . LORazepam  0-4 mg Oral Q6H   Followed by  . [START ON 09/01/2018] LORazepam  0-4 mg Oral Q12H  . mouth rinse  15 mL Mouth Rinse BID  . multivitamin with minerals  1 tablet Oral Daily  . nadolol  80 mg Oral BID  . omega-3 acid ethyl esters  1 g Oral BID  . pantoprazole  40 mg Oral Daily  . [START ON 08/31/2018] pneumococcal 23 valent vaccine  0.5 mL Intramuscular Tomorrow-1000  . thiamine  100 mg Oral Daily   Or  . thiamine  100 mg Intravenous Daily  . traMADol  50 mg Oral BID   . sodium chloride    . sodium chloride      Allergies: No Known Allergies  Social History   Socioeconomic History  . Marital status: Married    Spouse name: Not on file  . Number of children: Not on file  . Years of education: Not on file  . Highest education level: Not on file  Occupational History  . Not on file  Social Needs  . Financial resource strain: Not on file  . Food insecurity:    Worry: Not on file    Inability: Not on file  . Transportation needs:    Medical: Not on file    Non-medical: Not on file  Tobacco Use  . Smoking status: Never Smoker  . Smokeless tobacco: Never Used  Substance and Sexual Activity  . Alcohol use: Yes    Comment: occ  . Drug use: Not on file  . Sexual activity: Not on file  Lifestyle  . Physical activity:    Days per week: Not on file    Minutes per session: Not on file  . Stress: Not on file  Relationships  . Social connections:    Talks on phone: Not on file    Gets together: Not on file    Attends religious service: Not on file    Active member of club or organization: Not on file    Attends meetings of clubs or  organizations: Not on file    Relationship status: Not on file  . Intimate partner violence:    Fear of current or ex partner: Not on file    Emotionally abused: Not on file    Physically abused: Not on file    Forced sexual activity: Not on file  Other Topics Concern  . Not on file  Social History Narrative  . Not on file     History reviewed. No  pertinent family history.   Review of Systems: A 12-system review of systems was performed and is negative except as noted in the HPI.  Labs: Recent Labs    08/29/18 2227  TROPONINI <0.03   Lab Results  Component Value Date   WBC 4.4 08/30/2018   HGB 12.5 (L) 08/30/2018   HCT 39.1 08/30/2018   MCV 96.8 08/30/2018   PLT 77 (L) 08/30/2018    Recent Labs  Lab 08/29/18 2227 08/30/18 0510  NA 133* 138  K 4.3 4.4  CL 102 106  CO2 20* 24  BUN 13 11  CREATININE 0.88 0.74  CALCIUM 7.9* 8.0*  PROT 6.0*  --   BILITOT 1.5*  --   ALKPHOS 107  --   ALT 18  --   AST 47*  --   GLUCOSE 124* 105*   No results found for: CHOL, HDL, LDLCALC, TRIG No results found for: DDIMER  Radiology/Studies:  Dg Chest 2 View  Result Date: 08/29/2018 CLINICAL DATA:  Fall. Shortness breath. Nausea and vomiting. EXAM: CHEST - 2 VIEW COMPARISON:  02/27/2011 FINDINGS: Moderate enlargement of the cardiopericardial silhouette. Atherosclerotic calcification of the aortic arch. No edema. Thoracic spondylosis. No blunting of the costophrenic angles to suggest pleural effusion. IMPRESSION: 1. Moderate enlargement of the cardiopericardial silhouette, without edema. 2.  Aortic Atherosclerosis (ICD10-I70.0). Electronically Signed   By: Gaylyn RongWalter  Liebkemann M.D.   On: 08/29/2018 22:21   Ct Head Wo Contrast  Result Date: 08/29/2018 CLINICAL DATA:  Slurred speech. Fall tonight. Diabetes. Weakness. EXAM: CT HEAD WITHOUT CONTRAST TECHNIQUE: Contiguous axial images were obtained from the base of the skull through the vertex without intravenous contrast. COMPARISON:   None. FINDINGS: Brain: The brainstem, cerebellum, cerebral peduncles, thalami, basal ganglia, basilar cisterns, and ventricular system appear within normal limits. No intracranial hemorrhage, mass lesion, or acute CVA. Vascular: There is atherosclerotic calcification of the cavernous carotid arteries bilaterally. Skull: Unremarkable Sinuses/Orbits: Unremarkable Other: Unremarkable IMPRESSION: 1. No acute intracranial findings. 2. Atherosclerosis. Electronically Signed   By: Gaylyn RongWalter  Liebkemann M.D.   On: 08/29/2018 23:42   Ct Abdomen Pelvis W Contrast  Result Date: 08/29/2018 CLINICAL DATA:  Weakness. Right upper quadrant abdominal pain. Slurred speech. EXAM: CT ABDOMEN AND PELVIS WITH CONTRAST TECHNIQUE: Multidetector CT imaging of the abdomen and pelvis was performed using the standard protocol following bolus administration of intravenous contrast. CONTRAST:  100mL ISOVUE-300 IOPAMIDOL (ISOVUE-300) INJECTION 61% COMPARISON:  Abdominal ultrasound 02/28/2011 FINDINGS: Lower chest: Right heart enlargement. Small left pleural effusion with adjacent atelectasis. Descending aortic atherosclerotic vascular calcification. Hepatobiliary: Nodular hepatic contour compatible cirrhosis. Gallstones layering in the gallbladder with indistinct and potentially mildly thickened gallbladder wall. Pancreas: Mild indistinctness of pancreatic margin is probably incidental although could be a manifestation of pancreatitis. This is more notable in the pancreatic tail. Spleen: The spleen measures 15.8 by 8.3 by 17.4 cm (volume = 1200 cm^3). No focal splenic lesion is identified. Adrenals/Urinary Tract: An exophytic 9 mm lesion from the right mid kidney measures 29 Hounsfield units on portal venous phase and 28 Hounsfield units on delayed phase images, the lack of significant change probably indicates a lack of de-enhancement which would favor a complex but benign renal cyst. 2 mm left mid upper kidney nonobstructive renal calculus,  image 69/5. Bilateral perirenal stranding. 1.4 by 1.1 cm left mid kidney fluid density lesion posteriorly compatible with cyst. Stomach/Bowel: No dilated bowel identified. Appendix surgically absent. Vascular/Lymphatic: Splenorenal shunting. Recanalized umbilical vein. 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 Accentuated vasculature in the omentum. Patent portal vein. Aortoiliac atherosclerotic vascular disease. 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 Reactive lymph nodes in the porta hepatis. Reproductive: Punctate calcification in the right prostate gland, otherwise unremarkable. Other: Trace fluid along the paracolic gutters. Musculoskeletal: Unremarkable IMPRESSION: 1. Hepatic cirrhosis with portal venous hypertension, recanalized umbilical vein, and splenorenal shunting 2. Mild indistinctness of the pancreatic margin is probably incidental, less likely to be due to pancreatitis. Correlate with lipase levels. 3. Cholelithiasis. 4. Small left pleural effusion with adjacent atelectasis. 5. 2 mm left mid upper kidney nonobstructive renal calculus. 6. Aortoiliac atherosclerotic vascular disease. 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 7. Right heart enlargement. 8. Splenomegaly. 9. 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 Electronically Signed   By: Gaylyn Rong M.D.   On: 08/29/2018 23:52    Wt Readings from Last 3 Encounters:  08/29/18 114.3 kg  02/01/17 108.9 kg    EKG: Currently normal sinus rhythm  Physical Exam:  Blood pressure 115/83, pulse (!) 110, temperature 97.6 F (36.4 C), temperature source Oral, resp. rate 17, height 5\' 8"  (1.727 m), weight 114.3 kg, SpO2 97 %. Body mass index is 38.32 kg/m. General: Well developed, well nourished, in no acute distress. Head: Normocephalic, atraumatic, sclera non-icteric, no xanthomas, nares are without discharge.  Neck: Negative  for carotid bruits. JVD not elevated. Lungs: Clear bilaterally to auscultation without wheezes, rales, or rhonchi. Breathing is unlabored. Heart: RRR with S1 S2. No murmurs, rubs, or gallops appreciated. Abdomen: Soft, non-tender, non-distended with normoactive bowel sounds. No hepatomegaly. No rebound/guarding. No obvious abdominal masses. Msk:  Strength and tone appear normal for age. Extremities: No clubbing or cyanosis. No edema.  Distal pedal pulses are 2+ and equal bilaterally. Neuro: Alert and oriented X 3. No facial asymmetry. No focal deficit. Moves all extremities spontaneously. Psych:  Responds to questions appropriately with a normal affect.     Assessment and Plan  Patient is a 67 year old male with history of COPD, diabetes admitted after several falls.  He had a fall causing him to present to the emergency room.  He denied chest pain.  Is unclear when he had frank syncope.  His alcohol level was markedly elevated at 163.  He states he drank only of beer or 2.  He has no chest pain.  He has ruled out for myocardial infarction.  Echocardiogram revealed preserved LV function with dilated right side.  Carotid Dopplers are pending.  Etiology of falls are unclear.  Patient improved hemodynamically after being fairly hypotensive on presentation after hydration.  Etiology of fall may be transient atrial fibrillation with rapid ventricular response.  This is not currently been documented clearly on telemetry or EKG during this admission.  Low-grade view carotid Dopplers when available.  If unremarkable, the patient remains hemodynamically stable and able to ambulate, would consider discharge with outpatient follow-up to include a Holter monitor.  CIWA precautions prior to discharge.  Reducing ethanol intake or discontinuing this is recommended.  Would continue with nadolol and digoxin for now.  Would defer chronic anticoagulation due to frequent falls and heavy ethanol intake.  This will need to be  discussed as an outpatient after documentation of further atrial fibrillation.  Patient's chads score is 1 for age of 89, 1 for hypertension, 1 for diabetes.  Total is 3.    Signed, Dalia Heading MD 08/30/2018, 9:24 AM Pager: [phone]  Addendum-carotid Dopplers revealed less than 50% stenosis bilaterally.  These are noncritical lesions.  Discussed anticoagulation with the patient and his wife.  Given his chronic thrombocytopenia, they are preferring not to proceed with chronic anticoagulation.  At present given no sustained atrial fibrillation during this visit would defer chronic anticoagulation for now.  Would recommend Holter monitor as outpatient.  Continue to follow on CIWA precautions.  Follow for further arrhythmia.  This event does not appear to be ischemic.  Would remain off of nadolol for now given relative hypotension however as volume status improves, this will likely need to be restarted.

## 2018-08-31 ENCOUNTER — Observation Stay: Payer: Medicare Other

## 2018-08-31 ENCOUNTER — Encounter: Payer: Self-pay | Admitting: Radiology

## 2018-08-31 DIAGNOSIS — I824Y1 Acute embolism and thrombosis of unspecified deep veins of right proximal lower extremity: Secondary | ICD-10-CM | POA: Diagnosis not present

## 2018-08-31 DIAGNOSIS — J90 Pleural effusion, not elsewhere classified: Secondary | ICD-10-CM | POA: Diagnosis present

## 2018-08-31 DIAGNOSIS — I2699 Other pulmonary embolism without acute cor pulmonale: Secondary | ICD-10-CM | POA: Diagnosis not present

## 2018-08-31 DIAGNOSIS — I951 Orthostatic hypotension: Secondary | ICD-10-CM | POA: Diagnosis present

## 2018-08-31 DIAGNOSIS — I82411 Acute embolism and thrombosis of right femoral vein: Secondary | ICD-10-CM | POA: Diagnosis present

## 2018-08-31 DIAGNOSIS — I480 Paroxysmal atrial fibrillation: Secondary | ICD-10-CM | POA: Diagnosis present

## 2018-08-31 DIAGNOSIS — I82431 Acute embolism and thrombosis of right popliteal vein: Secondary | ICD-10-CM | POA: Diagnosis present

## 2018-08-31 DIAGNOSIS — E860 Dehydration: Secondary | ICD-10-CM | POA: Diagnosis present

## 2018-08-31 DIAGNOSIS — R7989 Other specified abnormal findings of blood chemistry: Secondary | ICD-10-CM | POA: Diagnosis present

## 2018-08-31 DIAGNOSIS — K766 Portal hypertension: Secondary | ICD-10-CM | POA: Diagnosis present

## 2018-08-31 DIAGNOSIS — Z7289 Other problems related to lifestyle: Secondary | ICD-10-CM | POA: Diagnosis not present

## 2018-08-31 DIAGNOSIS — J9601 Acute respiratory failure with hypoxia: Secondary | ICD-10-CM | POA: Diagnosis present

## 2018-08-31 DIAGNOSIS — Z6841 Body Mass Index (BMI) 40.0 and over, adult: Secondary | ICD-10-CM | POA: Diagnosis not present

## 2018-08-31 DIAGNOSIS — D6959 Other secondary thrombocytopenia: Secondary | ICD-10-CM | POA: Diagnosis present

## 2018-08-31 DIAGNOSIS — I82409 Acute embolism and thrombosis of unspecified deep veins of unspecified lower extremity: Secondary | ICD-10-CM | POA: Diagnosis not present

## 2018-08-31 DIAGNOSIS — Y906 Blood alcohol level of 120-199 mg/100 ml: Secondary | ICD-10-CM | POA: Diagnosis not present

## 2018-08-31 DIAGNOSIS — E872 Acidosis: Secondary | ICD-10-CM | POA: Diagnosis present

## 2018-08-31 DIAGNOSIS — Z23 Encounter for immunization: Secondary | ICD-10-CM | POA: Diagnosis present

## 2018-08-31 DIAGNOSIS — E119 Type 2 diabetes mellitus without complications: Secondary | ICD-10-CM | POA: Diagnosis not present

## 2018-08-31 DIAGNOSIS — R296 Repeated falls: Secondary | ICD-10-CM | POA: Diagnosis present

## 2018-08-31 DIAGNOSIS — D696 Thrombocytopenia, unspecified: Secondary | ICD-10-CM | POA: Diagnosis not present

## 2018-08-31 DIAGNOSIS — I2510 Atherosclerotic heart disease of native coronary artery without angina pectoris: Secondary | ICD-10-CM | POA: Diagnosis not present

## 2018-08-31 DIAGNOSIS — W19XXXA Unspecified fall, initial encounter: Secondary | ICD-10-CM | POA: Diagnosis not present

## 2018-08-31 DIAGNOSIS — J449 Chronic obstructive pulmonary disease, unspecified: Secondary | ICD-10-CM | POA: Diagnosis present

## 2018-08-31 DIAGNOSIS — I82441 Acute embolism and thrombosis of right tibial vein: Secondary | ICD-10-CM | POA: Diagnosis present

## 2018-08-31 DIAGNOSIS — J9811 Atelectasis: Secondary | ICD-10-CM | POA: Diagnosis present

## 2018-08-31 DIAGNOSIS — K7460 Unspecified cirrhosis of liver: Secondary | ICD-10-CM | POA: Diagnosis present

## 2018-08-31 DIAGNOSIS — I959 Hypotension, unspecified: Secondary | ICD-10-CM | POA: Diagnosis present

## 2018-08-31 LAB — CBC
HCT: 40.2 % (ref 39.0–52.0)
Hemoglobin: 13 g/dL (ref 13.0–17.0)
MCH: 31 pg (ref 26.0–34.0)
MCHC: 32.3 g/dL (ref 30.0–36.0)
MCV: 95.9 fL (ref 80.0–100.0)
Platelets: 70 10*3/uL — ABNORMAL LOW (ref 150–400)
RBC: 4.19 MIL/uL — ABNORMAL LOW (ref 4.22–5.81)
RDW: 14.5 % (ref 11.5–15.5)
WBC: 4.1 10*3/uL (ref 4.0–10.5)
nRBC: 0 % (ref 0.0–0.2)

## 2018-08-31 LAB — GLUCOSE, CAPILLARY
GLUCOSE-CAPILLARY: 105 mg/dL — AB (ref 70–99)
Glucose-Capillary: 166 mg/dL — ABNORMAL HIGH (ref 70–99)

## 2018-08-31 LAB — APTT: aPTT: 31 seconds (ref 24–36)

## 2018-08-31 LAB — FIBRIN DERIVATIVES D-DIMER (ARMC ONLY): Fibrin derivatives D-dimer (ARMC): 7359.94 ng/mL (FEU) — ABNORMAL HIGH (ref 0.00–499.00)

## 2018-08-31 LAB — PROTIME-INR
INR: 1.23
Prothrombin Time: 15.4 seconds — ABNORMAL HIGH (ref 11.4–15.2)

## 2018-08-31 LAB — HEPARIN LEVEL (UNFRACTIONATED): Heparin Unfractionated: 0.33 IU/mL (ref 0.30–0.70)

## 2018-08-31 MED ORDER — HEPARIN BOLUS VIA INFUSION
5000.0000 [IU] | Freq: Once | INTRAVENOUS | Status: DC
  Filled 2018-08-31: qty 5000

## 2018-08-31 MED ORDER — IOHEXOL 350 MG/ML SOLN
75.0000 mL | Freq: Once | INTRAVENOUS | Status: AC | PRN
  Administered 2018-08-31: 75 mL via INTRAVENOUS

## 2018-08-31 MED ORDER — NADOLOL 40 MG PO TABS
40.0000 mg | ORAL_TABLET | Freq: Two times a day (BID) | ORAL | Status: DC
  Filled 2018-08-31: qty 1

## 2018-08-31 MED ORDER — HEPARIN BOLUS VIA INFUSION
4000.0000 [IU] | Freq: Once | INTRAVENOUS | Status: AC
  Administered 2018-08-31: 4000 [IU] via INTRAVENOUS
  Filled 2018-08-31: qty 4000

## 2018-08-31 MED ORDER — HEPARIN (PORCINE) 25000 UT/250ML-% IV SOLN
1700.0000 [IU]/h | INTRAVENOUS | Status: AC
  Administered 2018-08-31 – 2018-09-02 (×2): 1500 [IU]/h via INTRAVENOUS
  Administered 2018-09-02: 1700 [IU]/h via INTRAVENOUS
  Filled 2018-08-31 (×4): qty 250

## 2018-08-31 NOTE — Plan of Care (Signed)
  Problem: Education: Goal: Knowledge of General Education information will improve Description: Including pain rating scale, medication(s)/side effects and non-pharmacologic comfort measures Outcome: Progressing   Problem: Health Behavior/Discharge Planning: Goal: Ability to manage health-related needs will improve Outcome: Progressing   Problem: Clinical Measurements: Goal: Ability to maintain clinical measurements within normal limits will improve Outcome: Progressing Goal: Diagnostic test results will improve Outcome: Progressing   Problem: Pain Managment: Goal: General experience of comfort will improve Outcome: Progressing   

## 2018-08-31 NOTE — Progress Notes (Addendum)
[employer] [facility] - [place] at [facility]   PATIENT NAME: Lawrence Osborn    MR#:  [id number]  DATE OF BIRTH:  March 08, 1951  SUBJECTIVE:  CHIEF COMPLAINT:   Chief Complaint  Patient presents with  . Fall  . Shortness of Breath   The patient has no complaints.  But heart rate is still about 110s.  He is off oxygen. REVIEW OF SYSTEMS:  Review of Systems  Constitutional: Negative for chills, fever and malaise/fatigue.  HENT: Negative for sore throat.   Eyes: Negative for blurred vision and double vision.  Respiratory: Negative for cough, hemoptysis, shortness of breath, wheezing and stridor.   Cardiovascular: Negative for chest pain, palpitations, orthopnea and leg swelling.  Gastrointestinal: Negative for abdominal pain, blood in stool, diarrhea, melena, nausea and vomiting.  Genitourinary: Negative for dysuria, flank pain and hematuria.  Musculoskeletal: Negative for back pain and joint pain.  Skin: Negative for rash.  Neurological: Negative for dizziness, sensory change, focal weakness, seizures, loss of consciousness, weakness and headaches.  Endo/Heme/Allergies: Negative for polydipsia.  Psychiatric/Behavioral: Negative for depression. The patient is not nervous/anxious.     DRUG ALLERGIES:  No Known Allergies VITALS:  Blood pressure (!) 129/93, pulse (!) 114, temperature 98.5 F (36.9 C), temperature source Oral, resp. rate 18, height 5\' 8"  (1.727 m), weight 120.5 kg, SpO2 91 %. PHYSICAL EXAMINATION:  Physical Exam Constitutional:      General: He is not in acute distress.    Comments: Morbid obesity.  HENT:     Head: Normocephalic.     Mouth/Throat:     Mouth: Mucous membranes are moist.  Eyes:     General: No scleral icterus.    Conjunctiva/sclera: Conjunctivae normal.     Pupils: Pupils are equal, round, and reactive to light.  Neck:     Musculoskeletal: Normal range of motion and neck supple.     Vascular: No JVD.     Trachea: No tracheal  deviation.  Cardiovascular:     Rate and Rhythm: Normal rate and regular rhythm.     Heart sounds: Normal heart sounds. No murmur. No gallop.   Pulmonary:     Effort: Pulmonary effort is normal. No respiratory distress.     Breath sounds: Normal breath sounds. No stridor. No wheezing, rhonchi or rales.  Abdominal:     General: Bowel sounds are normal. There is no distension.     Palpations: Abdomen is soft.     Tenderness: There is no abdominal tenderness. There is no rebound.  Musculoskeletal: Normal range of motion.        General: No tenderness.     Right lower leg: No edema.     Left lower leg: No edema.  Skin:    Findings: No erythema or rash.  Neurological:     Mental Status: He is alert and oriented to person, place, and time.     Cranial Nerves: No cranial nerve deficit.  Psychiatric:        Mood and Affect: Mood normal.    LABORATORY PANEL:  Male CBC Recent Labs  Lab 08/30/18 0510  WBC 4.4  HGB 12.5*  HCT 39.1  PLT 77*   ------------------------------------------------------------------------------------------------------------------ Chemistries  Recent Labs  Lab 08/29/18 2227 08/30/18 0510  NA 133* 138  K 4.3 4.4  CL 102 106  CO2 20* 24  GLUCOSE 124* 105*  BUN 13 11  CREATININE 0.88 0.74  CALCIUM 7.9* 8.0*  AST 47*  --   ALT 18  --  ALKPHOS 107  --   BILITOT 1.5*  --    RADIOLOGY:  No results found. ASSESSMENT AND PLAN:   Syncope -patient and his wife describe multiple episodes recently where he has fallen and had brief loss of consciousness or near loss of consciousness. Troponin was negative.Echocardiograph is unremarkable. Carotid ultrasound is unremarkable.  Possible due to hypotension or orthostatic hypotension. The patient needs Holter monitor as outpatient per Dr. Lady GaryFath.  Hypotensive episode -likely dehydration.  Blood pressure is normal but low side.  Hold nadolol.  Lactic acidosis. Improved with IV fluid.  PAF  (paroxysmal atrial fibrillation)with RVR Hold nadolol due to low side blood pressure. Continue aspirin and digoxin. Per Dr. Lady GaryFath, given his chronic thrombocytopenia, the patient and his wife are preferring not to proceed with chronic anticoagulation. At present given no sustained atrial fibrillation during this visit would defer chronic anticoagulation for now.  Follow-up Dr. Lady GaryFath as outpatient.  Diabetes (HCC) -sliding scale insulin coverage, resume home medication after discharge. Alcohol use - CIWA protocol, patient has mild fever but no anxiety or withdrawal symptoms. CAD (coronary artery disease) -continue home meds, other work-up as above COPD.  Stable. Hypothyroidism -home dose thyroid replacement. Morbid obesity.  Diet control and exercise was advised.  The patient has acute respiratory failure with hypoxia, tachycardia and elevated d-dimer, need to rule out PE.  Start heparin drip, check CT angiogram of the chest and venous duplex of bilateral lower extremities (DVT: Occlusive thrombus from the mid right femoral vein through the popliteal vein into the posterior tibial vein.). Bilateral segmental PE per radiologist. Vascular surgery consult for possible IVC filter.  All the records are reviewed and case discussed with Care Management/Social Worker. Management plans discussed with the patient, family and they are in agreement.  CODE STATUS: Full Code  TOTAL TIME TAKING CARE OF THIS PATIENT: 48 minutes.   More than 50% of the time was spent in counseling/coordination of care: YES  POSSIBLE D/C IN 2 DAYS, DEPENDING ON CLINICAL CONDITION.   Shaune PollackQing Dawn Convery M.D on 08/31/2018 at 3:37 PM  Between 7am to 6pm - Pager - [phone]  After 6pm go to [email or web address] - Therapist, nutritionalpassword [email or web address]  [facility] [place] [facility]

## 2018-08-31 NOTE — Plan of Care (Signed)
  Problem: Health Behavior/Discharge Planning: Goal: Ability to manage health-related needs will improve Outcome: Progressing Note:  CIWA score has remained flat today at only 1 for lunchtime. Will re-assess later in shift for any change. Will continue to monitor for any withdrawal symptoms. Lawrence FavreSteven M [facility]

## 2018-08-31 NOTE — Progress Notes (Addendum)
Paged Dr. Imogene Burnhen via phone regarding patient's critically high D-Dimer lab result. Awaiting response. Lawrence Osborn  Re-paged Dr. Imogene Burnhen to inform of D-Dimer value at this time. Awaiting response / new orders. Lawrence Osborn  Texted Dr. Imogene Burnhen via Epic regarding above at this time. Awaiting response. Lawrence Osborn

## 2018-08-31 NOTE — Progress Notes (Signed)
Lovenox injections refused by patient this shift. Pt reports discussing with MD due to chronic low platelet counts.

## 2018-08-31 NOTE — Progress Notes (Signed)
ANTICOAGULATION CONSULT NOTE  Pharmacy Consult for heparin Indication: pulmonary embolus  No Known Allergies  Patient Measurements: Height: 5\' 8"  (172.7 cm) Weight: 265 lb 11.2 oz (120.5 kg) IBW/kg (Calculated) : 68.4 Heparin Dosing Weight: 94.1 kg  Vital Signs: Temp: 98 F (36.7 C) (12/15 1540) Temp Source: Oral (12/15 1540) BP: 129/93 (12/15 1540) Pulse Rate: 114 (12/15 1540)  Labs: Recent Labs    08/29/18 2128 08/29/18 2227 08/30/18 0510  HGB 13.2  --  12.5*  HCT 39.7  --  39.1  PLT 97*  --  77*  CREATININE  --  0.88 0.74  TROPONINI  --  <0.03  --     Estimated Creatinine Clearance: 116.1 mL/min (by C-G formula based on SCr of 0.74 mg/dL).   Medical History: Past Medical History:  Diagnosis Date  . COPD (chronic obstructive pulmonary disease) (HCC)   . Diabetes mellitus without complication (HCC)   . GI bleed   . Pneumothorax     Assessment: 67 year old male with elevated d-dimer, concern for PE. No anticoagulation PTA or while here. Pharmacy consulted for heparin dosing.  Goal of Therapy:  Heparin level 0.3-0.7 units/ml Monitor platelets by anticoagulation protocol: Yes   Plan:  Platelets 12/14 low at 77. Repeat CBC pending. MD Imogene Burnhen aware of low platelets. Will bolus cautiously at 4000 units and follow with heparin drip at 1500 units/hr. Heparin level ordered for 2200. CBC with morning labs.  Pricilla RiffleAbby K Terresa Marlett, PharmD Pharmacy Resident  08/31/2018 4:18 PM

## 2018-08-31 NOTE — Progress Notes (Signed)
ANTICOAGULATION CONSULT NOTE  Pharmacy Consult for heparin Indication: pulmonary embolus  No Known Allergies  Patient Measurements: Height: 5\' 8"  (172.7 cm) Weight: 265 lb 11.2 oz (120.5 kg) IBW/kg (Calculated) : 68.4 Heparin Dosing Weight: 94.1 kg  Vital Signs: Temp: 98.5 F (36.9 C) (12/15 1936) Temp Source: Oral (12/15 1936) BP: 136/87 (12/15 1936) Pulse Rate: 115 (12/15 1936)  Labs: Recent Labs    08/29/18 2128 08/29/18 2227 08/30/18 0510 08/31/18 1601 08/31/18 2202  HGB 13.2  --  12.5* 13.0  --   HCT 39.7  --  39.1 40.2  --   PLT 97*  --  77* 70*  --   APTT  --   --   --  31  --   LABPROT  --   --   --  15.4*  --   INR  --   --   --  1.23  --   HEPARINUNFRC  --   --   --   --  0.33  CREATININE  --  0.88 0.74  --   --   TROPONINI  --  <0.03  --   --   --     Estimated Creatinine Clearance: 116.1 mL/min (by C-G formula based on SCr of 0.74 mg/dL).   Medical History: Past Medical History:  Diagnosis Date  . COPD (chronic obstructive pulmonary disease) (HCC)   . Diabetes mellitus without complication (HCC)   . GI bleed   . Pneumothorax     Assessment: 67 year old male with elevated d-dimer, concern for PE. No anticoagulation PTA or while here. Pharmacy consulted for heparin dosing.  Goal of Therapy:  Heparin level 0.3-0.7 units/ml Monitor platelets by anticoagulation protocol: Yes   Plan:  Platelets 12/14 low at 77. Repeat CBC pending. MD Imogene Burnhen aware of low platelets. Will bolus cautiously at 4000 units and follow with heparin drip at 1500 units/hr. Heparin level ordered for 2200. CBC with morning labs.  12/15 PM heparin level 0.33. Continue current regimen. Recheck heparin level and CBC with tomorrow AM labs.  Erich MontaneMcBane,Maruice Pieroni S, PharmD Pharmacy Resident  08/31/2018 10:47 PM

## 2018-08-31 NOTE — Plan of Care (Signed)
CIWA scores remain low. Mild tremors and unsteady gait with standing to void.  Bed alarm in place.

## 2018-09-01 DIAGNOSIS — I82409 Acute embolism and thrombosis of unspecified deep veins of unspecified lower extremity: Secondary | ICD-10-CM

## 2018-09-01 DIAGNOSIS — D696 Thrombocytopenia, unspecified: Secondary | ICD-10-CM

## 2018-09-01 DIAGNOSIS — I2510 Atherosclerotic heart disease of native coronary artery without angina pectoris: Secondary | ICD-10-CM

## 2018-09-01 DIAGNOSIS — I2699 Other pulmonary embolism without acute cor pulmonale: Principal | ICD-10-CM

## 2018-09-01 DIAGNOSIS — E119 Type 2 diabetes mellitus without complications: Secondary | ICD-10-CM

## 2018-09-01 DIAGNOSIS — I824Y1 Acute embolism and thrombosis of unspecified deep veins of right proximal lower extremity: Secondary | ICD-10-CM

## 2018-09-01 LAB — CBC
HCT: 37.9 % — ABNORMAL LOW (ref 39.0–52.0)
HEMOGLOBIN: 12.3 g/dL — AB (ref 13.0–17.0)
MCH: 31.1 pg (ref 26.0–34.0)
MCHC: 32.5 g/dL (ref 30.0–36.0)
MCV: 95.9 fL (ref 80.0–100.0)
Platelets: 61 10*3/uL — ABNORMAL LOW (ref 150–400)
RBC: 3.95 MIL/uL — ABNORMAL LOW (ref 4.22–5.81)
RDW: 14.3 % (ref 11.5–15.5)
WBC: 3.2 10*3/uL — ABNORMAL LOW (ref 4.0–10.5)
nRBC: 0 % (ref 0.0–0.2)

## 2018-09-01 LAB — GLUCOSE, CAPILLARY
GLUCOSE-CAPILLARY: 155 mg/dL — AB (ref 70–99)
Glucose-Capillary: 131 mg/dL — ABNORMAL HIGH (ref 70–99)
Glucose-Capillary: 206 mg/dL — ABNORMAL HIGH (ref 70–99)
Glucose-Capillary: 106 mg/dL — ABNORMAL HIGH (ref 70–99)
Glucose-Capillary: 159 mg/dL — ABNORMAL HIGH (ref 70–99)
Glucose-Capillary: 120 mg/dL — ABNORMAL HIGH (ref 70–99)

## 2018-09-01 LAB — BASIC METABOLIC PANEL
Anion gap: 8 (ref 5–15)
BUN: 11 mg/dL (ref 8–23)
CHLORIDE: 102 mmol/L (ref 98–111)
CO2: 27 mmol/L (ref 22–32)
Calcium: 8.2 mg/dL — ABNORMAL LOW (ref 8.9–10.3)
Creatinine, Ser: 0.61 mg/dL (ref 0.61–1.24)
GFR calc Af Amer: >60 mL/min (ref >60)
GFR calc non Af Amer: >60 mL/min (ref >60)
Glucose, Bld: 108 mg/dL — ABNORMAL HIGH (ref 70–99)
Potassium: 3.9 mmol/L (ref 3.5–5.1)
Sodium: 137 mmol/L (ref 135–145)

## 2018-09-01 LAB — HEPARIN LEVEL (UNFRACTIONATED): Heparin Unfractionated: 0.35 IU/mL (ref 0.30–0.70)

## 2018-09-01 NOTE — Progress Notes (Signed)
ANTICOAGULATION CONSULT NOTE  Pharmacy Consult for heparin Indication: pulmonary embolus  No Known Allergies  Patient Measurements: Height: 5\' 8"  (172.7 cm) Weight: 265 lb 11.2 oz (120.5 kg) IBW/kg (Calculated) : 68.4 Heparin Dosing Weight: 94.1 kg  Vital Signs: Temp: 98.5 F (36.9 C) (12/16 0727) Temp Source: Oral (12/16 0727) BP: 110/73 (12/16 0727) Pulse Rate: 111 (12/16 0727)  Labs: Recent Labs    08/29/18 2227 08/30/18 0510 08/31/18 1601 08/31/18 2202 09/01/18 0634  HGB  --  12.5* 13.0  --  12.3*  HCT  --  39.1 40.2  --  37.9*  PLT  --  77* 70*  --  61*  APTT  --   --  31  --   --   LABPROT  --   --  15.4*  --   --   INR  --   --  1.23  --   --   HEPARINUNFRC  --   --   --  0.33 0.35  CREATININE 0.88 0.74  --   --  0.61  TROPONINI <0.03  --   --   --   --     Estimated Creatinine Clearance: 116.1 mL/min (by C-G formula based on SCr of 0.61 mg/dL).   Medical History: Past Medical History:  Diagnosis Date  . COPD (chronic obstructive pulmonary disease) (HCC)   . Diabetes mellitus without complication (HCC)   . GI bleed   . Pneumothorax     Assessment: 67 year old male with elevated d-dimer, concern for PE. No anticoagulation PTA or while here. Pharmacy consulted for heparin dosing. Patient noted to have low platelets, MD Imogene Burnhen aware of low platelets.   12/15 Heparin started at 1500 units/hr  12/15 22:00 HL 0.33 12/15 06:30 HL 0.35, 2nd therapeutic level  Platelets 70>>61  Goal of Therapy:  Heparin level 0.3-0.7 units/ml Monitor platelets by anticoagulation protocol: Yes   Plan:  Platelets trending lower, will discuss with MD on rounds.  Patient has had 2 consecutive therapeutic Heparin levels. Will continue with current rate of 1500 units/hr. Next Heparin level will be with AM labs. Daily CBC while on Heparin drip.  Clovia CuffLisa Julieanna Geraci, PharmD, BCPS 09/01/2018 8:00 AM

## 2018-09-01 NOTE — Consult Note (Signed)
[facility]  Date of admission:  08/30/2018  Inpatient day:  09/01/2018  Consulting physician:  Dr. Shaune Pollack   Reason for Consultation:  PE + DVT + thrombocytopenia  Chief Complaint: Lawrence Osborn is a 67 y.o. male with a history of paroxysmal atrial fibrillation who was admitted through the emergency room with multiple episodes of syncope.  HPI:  The patient has a long standing history of thrombocytopenia.  He states the a decision was made 10 years ago while living in [place] not to anticoagulate him for paroxsymal atrial fibrillation secondary to thrombocytopenia.  He is unaware of the cause of his thrombocytopenia.  He describes a history of lupus 10 1/2 years ago.  He had proteinuria.  He saw a rheumatologist.  He was on steroids briefly.  He did not follow-up.  He has cirrhosis of the liver and splenomegaly.  Approximately 10 years ago while living in [place], he had a major upper GI bleed secondary to esophageal varcices.  He was placed on omeprazole.  He is unaware of any banding or cautery.  Colonoscopy 5 years ago in [place] was "ok".  He denies any current melena or hematochezia.  Prior to recent events, he denies any history of thrombosis or family history of thrombosis or malignancy.  Diet is good.  He denies any new medications except for Trulicity.  He denies any herbal productts except St John's wort.  He notes that his first fall was 6 weeks ago.  He denies any falls related to alcohol use.  Approximately 4 weeks ago he was started on Trulicity.  He notes symptoms of nausea and by report from his wife, multiple falls/syncope (he is unaware).  He denied any tachycardia, but notes that in the past when his hear rate was extremely high, he was unaware.  He denied any chest pain.  For about 1 month he has been extremely short of breath with minimal walking.  He denies any pleuritic chest pain or hemoptysis.  Review of prior CBCs dating back to  07/16/2016 reveal a platelet count ranging from 74,000 - 80,000. Creatinine has ranged from 0.6-0.8.  B12 was 467 on 01/21/2018.  TSH was 2.90 on 07/23/2018.  PSA was 0.85 on 07/23/2018.  Labs during this admission include:  CBC on 08/29/2018 revealed a hematocrit of 39.7, hemoglobin 13.2, MCV 95.7, platelets 97,000.  CBC on 09/01/2018 revealed a hematocrit of 37.9, hemoglobin 12.3, platelets 61,000, and WBC 3200.  Ethanol level was 163 (<10) on 08/29/2018.  D-dimers were 7,359.94 on 08/31/2018.  PT was 15.4 (INR 1.23) and PTT 31 on 08/31/2018.  Imaging studies include:  Head CT on 08/29/2018 revealed no acute findings.    Abdomen and pelvic CT on 08/29/2018 revealed hepatic cirrhosis with portal venous hypertension, recanalized umbilical vein, and splenorenal shunting.  There was mild indistinctness of the pancreatic margin, probably incidental, less likely to be due to pancreatitis. There was a small left pleural effusion with adjacent atelectasis.  There was rright heart enlargement.  There was splenomegaly (15.8 x 8.3 x 17.4 cm; volume 1200 cm3).  Bilateral carotid ultrasound on 08/30/2018 revealed mild stenosis in the proximal right internal carotid and proximal left internal carotid artery.  Chest CT on 08/31/2018 revealed large volume of pulmonary emboli in the lungs bilaterally, with dilatation of the right atrium and right ventricle (RV to LV ratio of 1.19) indicative of elevated right-sided heart pressures and right heart strain. He has a left pleural effusion and left lower  lobe atelectasis. He has findings consistent with hepatic cirrhosis and portal hypertension.  Bilateral lower extremity ultrasound on 08/31/2018 revealed occlusive thrombus from the mid right femoral vein through the popliteal vein into the posterior tibial vein.  He was placed on a heparin drip.   Past Medical History:  Diagnosis Date  . COPD (chronic obstructive pulmonary disease) (HCC)   . Diabetes mellitus  without complication (HCC)   . GI bleed   . Pneumothorax     Past Surgical History:  Procedure Laterality Date  . APPENDECTOMY    . HERNIA REPAIR      History reviewed. No pertinent family history.  Social History:  reports that he has never smoked. He has never used smokeless tobacco. He reports current alcohol use. No history on file for drug.  The patient smoked 1.5 ppd x 35 years (age [age]) for 55 pack year smoking history.  He stopped smoking 10 years ago.  He has a history of heavy drinking.  He currently drinks 2 bottles of beer a day.  He denies any history of alcohol withdrawal.  He denies any exposure to radiation or toxins.  He is from [place].  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  He is retired.  He came to the [place] in [place].  He became a [place] citizen approximately 10 years ago.  He is alone today.  Allergies: No Known Allergies  Medications Prior to Admission  Medication Sig Dispense Refill  . albuterol (PROVENTIL HFA;VENTOLIN HFA) 108 (90 Base) MCG/ACT inhaler Inhale 2 puffs into the lungs every 6 (six) hours as needed for wheezing or shortness of breath.    Marland Kitchen. aspirin EC 81 MG tablet Take 81 mg by mouth daily.    . digoxin (LANOXIN) 0.125 MG tablet Take 0.125 mg by mouth daily.    . Dulaglutide (TRULICITY) 0.75 MG/0.5ML SOPN Inject 0.75 mg into the skin every Sunday.    . Insulin Glargine (BASAGLAR KWIKPEN) 100 UNIT/ML SOPN Inject 30 Units into the skin 2 (two) times daily.    . insulin lispro (HUMALOG KWIKPEN) 100 UNIT/ML KwikPen Inject 50 Units into the skin 3 (three) times daily.    Marland Kitchen. levothyroxine (SYNTHROID, LEVOTHROID) 100 MCG tablet Take 100 mcg by mouth daily before breakfast.    . nadolol (CORGARD) 40 MG tablet Take 80 mg by mouth 2 (two) times daily.    Marland Kitchen. omega-3 fish oil (MAXEPA) 1000 MG CAPS capsule Take 1 capsule by mouth 2 (two) times daily.    Marland Kitchen. omeprazole (PRILOSEC) 20 MG capsule Take 20 mg  by mouth daily.    . QUEtiapine (SEROQUEL) 100 MG tablet Take 100-200 mg by mouth 2 (two) times daily. 100 mg in the morning and 200 mg at bedtime    . ST JOHNS WORT PO Take 1 tablet by mouth 2 (two) times daily.    . traMADol (ULTRAM) 50 MG tablet Take 50 mg by mouth 2 (two) times daily.      Review of Systems: GENERAL:  No fevers, sweats or weight loss. PERFORMANCE STATUS (ECOG):  1 HEENT:  No visual changes, runny nose, sore throat, mouth sores or tenderness. Lungs: Shortness of breath x 1 month.  No pleuritic chest pain.  No cough.  No hemoptysis. Cardiac:  H/o paroxysmal atrial fibrillation.  No chest pain or palpitations. GI:  Nausea associated with Trulicity.  No vomiting, diarrhea, constipation, melena or hematochezia. GU:  No urgency,  frequency, dysuria, or hematuria. Musculoskeletal:  No back pain.  No joint pain.  No muscle tenderness. Extremities:  Chronic lower extremity swelling.  No pain. Skin:  No rashes or skin changes. Neuro:  No headache, numbness or weakness, balance or coordination issues. Endocrine:  Diabetes.  Thyroid disease.  No hot flashes or night sweats. Psych:  No mood changes, depression or anxiety. Pain:  No focal pain. Review of systems:  All other systems reviewed and found to be negative.  Physical Exam:  Blood pressure 110/73, pulse (!) 117, temperature 98.5 F (36.9 C), temperature source Oral, resp. rate 19, height 5\' 8"  (1.727 m), weight 265 lb 11.2 oz (120.5 kg), SpO2 95 %.  GENERAL:  Well developed, well nourished, gentleman sitting comfortably on the medical unit in a recliner in no acute distress. MENTAL STATUS:  Alert and oriented to person, place and time. HEAD:  Brown hair with beard.  Male pattern baldness.  Normocephalic, atraumatic, face symmetric, no Cushingoid features. EYES:  Glasses.  Blue eyes.  Pupils equal round and reactive to light and accomodation.  No conjunctivitis or scleral icterus. ENT:  Oropharynx clear without lesion.   Tongue normal.  Poor dentition.  Mucous membranes moist.  RESPIRATORY:  Clear to auscultation without rales, wheezes or rhonchi. CARDIOVASCULAR:  Regular rate and rhythm without murmur, rub or gallop. ABDOMEN:  Protuberant.  Soft, non-tender, with active bowel sounds.  Hepatosplenomegaly difficult to assess.  Spleen palpable.  No masses. SKIN:  No rashes, ulcers or lesions. EXTREMITIES: Chronic 2+ bilateral lower extremity edema.  No skin discoloration or tenderness.  No palpable cords. LYMPH NODES: No palpable cervical, supraclavicular, axillary or inguinal adenopathy  NEUROLOGICAL: Unremarkable. PSYCH:  Appropriate.   Results for orders placed or performed during the hospital encounter of 08/29/18 (from the past 48 hour(s))  Glucose, capillary     Status: Abnormal   Collection Time: 08/30/18  9:19 PM  Result Value Ref Range   Glucose-Capillary 135 (H) 70 - 99 mg/dL   Comment 1 Notify RN    Comment 2 Document in Chart   Glucose, capillary     Status: Abnormal   Collection Time: 08/31/18  7:48 AM  Result Value Ref Range   Glucose-Capillary 105 (H) 70 - 99 mg/dL  Glucose, capillary     Status: Abnormal   Collection Time: 08/31/18 11:38 AM  Result Value Ref Range   Glucose-Capillary 166 (H) 70 - 99 mg/dL  Fibrin derivatives D-Dimer ([facility] only)     Status: Abnormal   Collection Time: 08/31/18  2:03 PM  Result Value Ref Range   Fibrin derivatives D-dimer ([facility]) 7,359.94 (H) 0.00 - 499.00 ng/mL (FEU)    Comment: (NOTE) <> Exclusion of Venous Thromboembolism (VTE) - OUTPATIENT ONLY   (Emergency Department or [place])   0-499 ng/ml (FEU): With a low to intermediate pretest probability                      for VTE this test result excludes the diagnosis                      of VTE.   >499 ng/ml (FEU) : VTE not excluded; additional work up for VTE is                      required. <> Testing on Inpatients and Evaluation of Disseminated Intravascular   Coagulation (DIC) Reference Range:    0-499 ng/ml (FEU) Performed at [facility], [address] [place], [place] [address]   APTT     Status: None   Collection Time: 08/31/18  4:01 PM  Result Value Ref Range   aPTT 31 24 - 36 seconds    Comment: Performed at [facility], [address] [place], [place] [address]  Protime-INR     Status: Abnormal   Collection Time: 08/31/18  4:01 PM  Result Value Ref Range   Prothrombin Time 15.4 (H) 11.4 - 15.2 seconds   INR 1.23     Comment: Performed at [facility], [address] [place], [place] [address]  CBC     Status: Abnormal   Collection Time: 08/31/18  4:01 PM  Result Value Ref Range   WBC 4.1 4.0 - 10.5 K/uL   RBC 4.19 (L) 4.22 - 5.81 MIL/uL   Hemoglobin 13.0 13.0 - 17.0 g/dL   HCT 91.4 78.2 - 95.6 %   MCV 95.9 80.0 - 100.0 fL   MCH 31.0 26.0 - 34.0 pg   MCHC 32.3 30.0 - 36.0 g/dL   RDW 21.3 08.6 - 57.8 %   Platelets 70 (L) 150 - 400 K/uL    Comment: Immature Platelet Fraction may be clinically indicated, consider ordering this additional test [id number]    nRBC 0.0 0.0 - 0.2 %    Comment: Performed at [facility], [address] [place], [place] [address]  Glucose, capillary     Status: Abnormal   Collection Time: 08/31/18  4:25 PM  Result Value Ref Range   Glucose-Capillary 131 (H) 70 - 99 mg/dL  Glucose, capillary     Status: Abnormal   Collection Time: 08/31/18  8:38 PM  Result Value Ref Range   Glucose-Capillary 206 (H) 70 - 99 mg/dL  Heparin level (unfractionated)     Status: None   Collection Time: 08/31/18 10:02 PM  Result Value Ref Range   Heparin Unfractionated 0.33 0.30 - 0.70 IU/mL    Comment: (NOTE) If heparin results are below expected values, and patient dosage has  been confirmed, suggest follow up testing of antithrombin III levels. Performed at [facility], [address] [place], [place] [address]   CBC     Status: Abnormal   Collection Time: 09/01/18  6:34 AM  Result Value Ref  Range   WBC 3.2 (L) 4.0 - 10.5 K/uL   RBC 3.95 (L) 4.22 - 5.81 MIL/uL   Hemoglobin 12.3 (L) 13.0 - 17.0 g/dL   HCT 27.2 (L) 53.6 - 64.4 %   MCV 95.9 80.0 - 100.0 fL   MCH 31.1 26.0 - 34.0 pg   MCHC 32.5 30.0 - 36.0 g/dL   RDW 03.4 74.2 - 59.5 %   Platelets 61 (L) 150 - 400 K/uL    Comment: Immature Platelet Fraction may be clinically indicated, consider ordering this additional test [id number]    nRBC 0.0 0.0 - 0.2 %    Comment: Performed at [facility], [address] [place], [place] [address]  Basic metabolic panel     Status: Abnormal   Collection Time: 09/01/18  6:34 AM  Result Value Ref Range   Sodium 137 135 - 145 mmol/L   Potassium 3.9 3.5 - 5.1 mmol/L   Chloride 102 98 - 111 mmol/L   CO2 27 22 - 32 mmol/L   Glucose, Bld 108 (H) 70 - 99 mg/dL   BUN 11 8 - 23 mg/dL   Creatinine, Ser 8.84 0.61 - 1.24 mg/dL  Calcium 8.2 (L) 8.9 - 10.3 mg/dL   GFR calc non Af Amer >60 >60 mL/min   GFR calc Af Amer >60 >60 mL/min   Anion gap 8 5 - 15    Comment: Performed at [facility], [address] [place], [place] [address]  Heparin level (unfractionated)     Status: None   Collection Time: 09/01/18  6:34 AM  Result Value Ref Range   Heparin Unfractionated 0.35 0.30 - 0.70 IU/mL    Comment: (NOTE) If heparin results are below expected values, and patient dosage has  been confirmed, suggest follow up testing of antithrombin III levels. Performed at [facility], [address] [place], [place] [address]   Glucose, capillary     Status: Abnormal   Collection Time: 09/01/18  7:28 AM  Result Value Ref Range   Glucose-Capillary 106 (H) 70 - 99 mg/dL  Glucose, capillary     Status: Abnormal   Collection Time: 09/01/18 11:46 AM  Result Value Ref Range   Glucose-Capillary 159 (H) 70 - 99 mg/dL  Glucose, capillary     Status: Abnormal   Collection Time: 09/01/18  5:35 PM  Result Value Ref Range   Glucose-Capillary 155 (H) 70 - 99 mg/dL   Ct Angio  Chest Pe W Or Wo Contrast  Result Date: 08/31/2018 CLINICAL DATA:  Increased shortness of breath. Patient fell approximately 5 weeks ago. History of diabetes and chronic obstructive pulmonary disease. EXAM: CT ANGIOGRAPHY CHEST WITH CONTRAST TECHNIQUE: Multidetector CT imaging of the chest was performed using the standard protocol during bolus administration of intravenous contrast. Multiplanar CT image reconstructions and MIPs were obtained to evaluate the vascular anatomy. CONTRAST:  75mL OMNIPAQUE IOHEXOL 350 MG/ML SOLN COMPARISON:  Chest radiographs today.  Abdominal CT 08/29/2018. FINDINGS: Cardiovascular: The pulmonary arteries are well opacified with contrast to the level of the subsegmental branches. There are extensive pulmonary emboli involving the lobar and segmental pulmonary arteries bilaterally. There is mild involvement of the distal left pulmonary artery. There is diffuse atherosclerosis of the aorta, great vessels and coronary arteries. The heart is mildly enlarged. There is asymmetric dilatation of the right ventricle with an RV to LV ratio of 1.19, consistent with right ventricular strain. No significant pericardial effusion. Mediastinum/Nodes: There are no enlarged mediastinal, hilar or axillary lymph nodes. The thyroid gland, trachea and esophagus demonstrate no significant findings. Lungs/Pleura: Small dependent left pleural effusion. No significant right pleural effusion or pneumothorax. Mild emphysema with dependent left lower lobe atelectasis. No confluent airspace opacity or suspicious pulmonary nodule. Upper abdomen: Diffuse morphologic changes of hepatic cirrhosis are again noted with a recanalized left paraumbilical vein and splenomegaly. Calcified gallstones are present. There is mild ascites. Musculoskeletal/Chest wall: There is no chest wall mass or suspicious osseous finding. An old rib fractures noted on the right. There is symmetric bilateral gynecomastia. Review of the MIP  images confirms the above findings. IMPRESSION: 1. Large volume of pulmonary emboli in the lungs bilaterally, with dilatation of the right atrium and right ventricle (RV to LV ratio of 1.19) indicative of elevated right-sided heart pressures and right heart strain. These findings have been shown to be associated with a increased morbidity and mortality in the setting pulmonary embolism. 2. Coronary and Aortic Atherosclerosis (ICD10-I70.0). 3. Left pleural effusion and left lower lobe atelectasis. 4. Redemonstrated are findings consistent with hepatic cirrhosis and portal hypertension. 5. Critical Value/emergent results were called by telephone at the time of interpretation on 08/31/2018 at 5:45 pm to Dr.  Shaune Pollack , who verbally acknowledged these results. Electronically Signed   By: Carey Bullocks M.D.   On: 08/31/2018 17:43   US Venous Img Lower Bilateral  Result Date: 08/31/2018 CLINICAL DATA:  Tachycardia EXAM: BILATERAL LOWER EXTREMITY VENOUS DOPPLER ULTRASOUND TECHNIQUE: Gray-scale sonography with graded compression, as well as color Doppler and duplex ultrasound were performed to evaluate the lower extremity deep venous systems from the level of the common femoral vein and including the common femoral, femoral, profunda femoral, popliteal and calf veins including the posterior tibial, peroneal and gastrocnemius veins when visible. The superficial great saphenous vein was also interrogated. Spectral Doppler was utilized to evaluate flow at rest and with distal augmentation maneuvers in the common femoral, femoral and popliteal veins. COMPARISON:  None. FINDINGS: RIGHT LOWER EXTREMITY Common Femoral Vein: No evidence of thrombus. Normal compressibility, respiratory phasicity and response to augmentation. Saphenofemoral Junction: No evidence of thrombus. Normal compressibility and flow on color Doppler imaging. Profunda Femoral Vein: No evidence of thrombus. Normal compressibility and flow on color Doppler  imaging. Femoral Vein: Occlusive thrombus noted in the mid and distal femoral vein. Vessel is noncompressible. Popliteal Vein: Occlusive thrombus noted in the popliteal vein. Vessel is noncompressible. Calf Veins: Occlusive thrombus extends in to posterior tibial vein. Superficial Great Saphenous Vein: No evidence of thrombus. Normal compressibility. Venous Reflux:  None. Other Findings:  None. LEFT LOWER EXTREMITY Common Femoral Vein: No evidence of thrombus. Normal compressibility, respiratory phasicity and response to augmentation. Saphenofemoral Junction: No evidence of thrombus. Normal compressibility and flow on color Doppler imaging. Profunda Femoral Vein: No evidence of thrombus. Normal compressibility and flow on color Doppler imaging. Femoral Vein: No evidence of thrombus. Normal compressibility, respiratory phasicity and response to augmentation. Popliteal Vein: No evidence of thrombus. Normal compressibility, respiratory phasicity and response to augmentation. Calf Veins: No evidence of thrombus. Normal compressibility and flow on color Doppler imaging. Superficial Great Saphenous Vein: No evidence of thrombus. Normal compressibility. Venous Reflux:  None. Other Findings:  None. IMPRESSION: Occlusive thrombus from the mid right femoral vein through the popliteal vein into the posterior tibial vein. Electronically Signed   By: Charlett Nose M.D.   On: 08/31/2018 17:25    Assessment:  The patient is a 67 y.o. gentleman with bilateral pulmonary emboli and a right lower extremity DVT presenting with shortness of breath and recurrent syncope x 1 month.  Abdomen and pelvic CT on 08/29/2018 revealed hepatic cirrhosis with portal venous hypertension, recanalized umbilical vein, and splenorenal shunting.  There was right heart enlargement.  There was splenomegaly (15.8 x 8.3 x 17.4 cm; volume 1200 cm3).  Chest CT on 08/31/2018 revealed large volume of pulmonary emboli in the lungs bilaterally, with  dilatation of the right atrium and right ventricle (RV to LV ratio of 1.19) indicative of elevated right-sided heart pressures and right heart strain.  Bilateral lower extremity ultrasound on 08/31/2018 revealed occlusive thrombus from the mid right femoral vein through the popliteal vein into the posterior tibial vein.  He has a long standing history of thrombocytopenia likely secondary to cirrhosis and massive splenomegaly.  He drinks alcohol daily.  He has a history of lupus treated with steroids.  Symptomatically, he notes shortness of breath with minimal exertion.  Exam reveals a protuberant abdomen, splenomegaly, and bilateral lower extremity edema.  Plan:   1.  Bilateral pulmonary embolism and Right lower extremity DVT:  Patient has significant clot burden in his lungs.  Patient currently on heparin.  Discussed risk versus benefit of anticoagulation  versus filter given chronic thrombocytopenia.  Patients typically can be anticoagulated with platelet counts > 50,000.  Concern for history of falls/syncope (patient denies related to alcohol) likely related to pulmonary emboli.  Concern for history of major GI bleed (varices) 10 years ago while living in [place].  Labs: Factor V Leiden, prothrombin gene mutation, lupus anticoagulant work-up. 2.  Thrombocytopenia:  Longstanding (at least 10 years).  Previously prevented anticoagulation for paroxysmal atrial fibrillation in [place].  Etiology likely secondary to cirrhosis and massive splenomegaly.  Diet appears good.  No new medications except Trulicity.  No herbal products except St John's wart.  Labs:  B12, folate, ANA, hepatitis B and C serologies, H pylori serologies. 3.  Patient is currently deciding whether he wishes to pursue anticoagulation.   4.  Patient being evaluated for a possible IVC filter.  Dr. Lady Gary would like to pursue Eliquis.   Thank you for allowing me to participate in Lawrence Osborn 's care.  I will follow him  closely with you while hospitalized and after discharge in the outpatient department.   Rosey Bath, MD  09/01/2018, 5:40 PM

## 2018-09-01 NOTE — Consult Note (Signed)
[facility] Vascular Consult Note  MRN : [id number]  Lawrence Osborn is a 67 y.o. (14-Apr-1951) male who presents with chief complaint of  Chief Complaint  Patient presents with  . Fall  . Shortness of Breath   History of Present Illness:  The patient is a 67 year old male with a past medical history of GI bleed, diabetes, COPD, alcohol abuse, coronary artery disease, paroxysmal atrial fibrillation, hypothyroidism, who presented to the [facility] emergency department with a history of multiple "recent falls".  The patient describes his falls as "syncopal events".   At the time of admission, the patient was complaining of shortness of breath however denied chest pain.  He noted that his lower extremity felt "weak".  History of possible transient atrial fibrillation however this time is in normal sinus rhythm.  Non-critical carotid duplex with less than 50% stenosis bilaterally.  On August 31, 2018 the patient was started on a heparin drip.  (08/31/18) CTA Chest: Large volume of pulmonary emboli in the lungs bilaterally, with dilatation of the right atrium and right ventricle (RV to LV ratio of 1.19) indicative of elevated right-sided heart pressures and right heart strain. These findings have been shown to be associated with a increased morbidity and mortality in the setting pulmonary embolism. 2. Coronary and Aortic Atherosclerosis (ICD10-I70.0). 3. Left pleural effusion and left lower lobe atelectasis. 4. Redemonstrated are findings consistent with hepatic cirrhosis and portal hypertension.  (08/31/18) Bilateral Lower Extremity Venous Duplex: Occlusive thrombus from the mid right femoral vein through the popliteal vein into the posterior tibial vein.  Since his admission the patient shortness of breath has improved.  He is now no longer on supplemental oxygen.  Patient denies any lower extremity discomfort.  Patient continues to deny any chest  pain.  Patient denies any fever, nausea vomiting.  Vascular surgery was consulted by Dr. Imogene Burn for DVT, PE and possible IVC filter placement  Current Facility-Administered Medications  Medication Dose Route Frequency Provider Last Rate Last Dose  . acetaminophen (TYLENOL) tablet 650 mg  650 mg Oral Q6H PRN Oralia Manis, MD       Or  . acetaminophen (TYLENOL) suppository 650 mg  650 mg Rectal Q6H PRN Oralia Manis, MD      . aspirin EC tablet 81 mg  81 mg Oral Daily Oralia Manis, MD   81 mg at 09/01/18 0904  . digoxin (LANOXIN) tablet 0.125 mg  0.125 mg Oral Daily Oralia Manis, MD   0.125 mg at 09/01/18 2130  . folic acid (FOLVITE) tablet 1 mg  1 mg Oral Daily Oralia Manis, MD   1 mg at 09/01/18 0905  . heparin ADULT infusion 100 units/mL (25000 units/226mL sodium chloride 0.45%)  1,500 Units/hr Intravenous Continuous Pricilla Riffle, RPH 15 mL/hr at 08/31/18 1617 1,500 Units/hr at 08/31/18 1617  . insulin aspart (novoLOG) injection 0-15 Units  0-15 Units Subcutaneous TID WC Oralia Manis, MD   2 Units at 08/31/18 1628  . insulin aspart (novoLOG) injection 0-5 Units  0-5 Units Subcutaneous QHS Oralia Manis, MD   2 Units at 08/31/18 2119  . levothyroxine (SYNTHROID, LEVOTHROID) tablet 100 mcg  100 mcg Oral QAC breakfast Oralia Manis, MD   100 mcg at 09/01/18 0744  . LORazepam (ATIVAN) tablet 1 mg  1 mg Oral Q6H PRN Oralia Manis, MD       Or  . LORazepam (ATIVAN) injection 1 mg  1 mg Intravenous Q6H PRN Oralia Manis, MD      .  MEDLINE mouth rinse  15 mL Mouth Rinse BID Oralia Manis, MD   15 mL at 08/30/18 2304  . multivitamin with minerals tablet 1 tablet  1 tablet Oral Daily Oralia Manis, MD      . omega-3 acid ethyl esters (LOVAZA) capsule 1 g  1 g Oral BID Oralia Manis, MD   1 g at 09/01/18 0905  . ondansetron (ZOFRAN) tablet 4 mg  4 mg Oral Q6H PRN Oralia Manis, MD       Or  . ondansetron [facility]) injection 4 mg  4 mg Intravenous Q6H PRN Oralia Manis, MD      . pantoprazole  (PROTONIX) EC tablet 40 mg  40 mg Oral Daily Oralia Manis, MD   40 mg at 09/01/18 0904  . QUEtiapine (SEROQUEL) tablet 100 mg  100 mg Oral q morning - 10a Oralia Manis, MD   100 mg at 09/01/18 8295   And  . QUEtiapine (SEROQUEL) tablet 200 mg  200 mg Oral Lamont Snowball, MD   200 mg at 08/31/18 2118  . sodium chloride 0.9 % bolus 1,000 mL  1,000 mL Intravenous Once Arnaldo Natal, MD      . sodium chloride flush (NS) 0.9 % injection 3 mL  3 mL Intravenous Q12H Shaune Pollack, MD   3 mL at 09/01/18 0914  . thiamine (VITAMIN B-1) tablet 100 mg  100 mg Oral Daily Oralia Manis, MD   100 mg at 09/01/18 6213   Or  . thiamine (B-1) injection 100 mg  100 mg Intravenous Daily Oralia Manis, MD      . traMADol Janean Sark) tablet 50 mg  50 mg Oral BID Oralia Manis, MD       Past Medical History:  Diagnosis Date  . COPD (chronic obstructive pulmonary disease) (HCC)   . Diabetes mellitus without complication (HCC)   . GI bleed   . Pneumothorax    Past Surgical History:  Procedure Laterality Date  . APPENDECTOMY    . HERNIA REPAIR     Social History Social History   Tobacco Use  . Smoking status: Never Smoker  . Smokeless tobacco: Never Used  Substance Use Topics  . Alcohol use: Yes    Comment: occ  . Drug use: Not on file   Family History History reviewed. No pertinent family history.  Denies any family history of peripheral artery disease, venous disease or bleeding/clotting disorders.  No Known Allergies  REVIEW OF SYSTEMS (Negative unless checked)  Constitutional: [] Weight loss  [] Fever  [] Chills Cardiac: [] Chest pain   [] Chest pressure   [] Palpitations   [x] Shortness of breath when laying flat   [x] Shortness of breath at rest   [x] Shortness of breath with exertion. Vascular:  [] Pain in legs with walking   [] Pain in legs at rest   [] Pain in legs when laying flat   [] Claudication   [] Pain in feet when walking  [] Pain in feet at rest  [] Pain in feet when laying flat   [] History of  DVT   [] Phlebitis   [] Swelling in legs   [] Varicose veins   [] Non-healing ulcers Pulmonary:   [] Uses home oxygen   [] Productive cough   [] Hemoptysis   [] Wheeze  [x] COPD   [] Asthma Neurologic:  [] Dizziness  [] Blackouts   [] Seizures   [] History of stroke   [] History of TIA  [] Aphasia   [] Temporary blindness   [] Dysphagia   [] Weakness or numbness in arms   [x] Weakness or numbness in legs Musculoskeletal:  [] Arthritis   [] Joint  swelling   [] Joint pain   [] Low back pain Hematologic:  [] Easy bruising  [] Easy bleeding   [] Hypercoagulable state   [] Anemic  [] Hepatitis Gastrointestinal:  [] Blood in stool   [] Vomiting blood  [] Gastroesophageal reflux/heartburn   [] Difficulty swallowing. Genitourinary:  [] Chronic kidney disease   [] Difficult urination  [] Frequent urination  [] Burning with urination   [] Blood in urine Skin:  [] Rashes   [] Ulcers   [] Wounds Psychological:  [] History of anxiety   []  History of major depression.  Physical Examination  Vitals:   08/31/18 1936 09/01/18 0436 09/01/18 0727 09/01/18 0904  BP: 136/87 (!) 103/59 110/73   Pulse: (!) 115 (!) 111 (!) 111 (!) 113  Resp: 16 14 19    Temp: 98.5 F (36.9 C)  98.5 F (36.9 C)   TempSrc: Oral  Oral   SpO2: 97% 97% 96%   Weight:      Height:       Body mass index is 40.4 kg/m. Gen:  WD/WN, NAD Head: [place]/AT, No temporalis wasting. Prominent temp pulse not noted. Ear/Nose/Throat: Hearing grossly intact, nares w/o erythema or drainage, oropharynx w/o Erythema/Exudate Eyes: Sclera non-icteric, conjunctiva clear Neck: Trachea midline.  No JVD.  Pulmonary:  Good air movement, respirations not labored, equal bilaterally.  Cardiac: RRR, normal S1, S2. Vascular:  Vessel Right Left  Radial Palpable Palpable  Ulnar Palpable Palpable  Brachial Palpable Palpable  Carotid Palpable, without bruit Palpable, without bruit  Aorta Not palpable N/A  Femoral Palpable Palpable  Popliteal Palpable Palpable  PT Palpable Palpable  DP Palpable  Palpable   Gastrointestinal: soft, non-tender/non-distended. No guarding/reflex.  Musculoskeletal: M/S 5/5 throughout.  Extremities without ischemic changes.  No deformity or atrophy. No edema. Neurologic: Sensation grossly intact in extremities.  Symmetrical.  Speech is fluent. Motor exam as listed above. Psychiatric: Judgment intact, Mood & affect appropriate for pt's clinical situation. Dermatologic: No rashes or ulcers noted.  No cellulitis or open wounds. Lymph : No Cervical, Axillary, or Inguinal lymphadenopathy.  CBC Lab Results  Component Value Date   WBC 3.2 (L) 09/01/2018   HGB 12.3 (L) 09/01/2018   HCT 37.9 (L) 09/01/2018   MCV 95.9 09/01/2018   PLT 61 (L) 09/01/2018   BMET    Component Value Date/Time   NA 137 09/01/2018 0634   K 3.9 09/01/2018 0634   CL 102 09/01/2018 0634   CO2 27 09/01/2018 0634   GLUCOSE 108 (H) 09/01/2018 0634   BUN 11 09/01/2018 0634   CREATININE 0.61 09/01/2018 0634   CALCIUM 8.2 (L) 09/01/2018 0634   GFRNONAA >60 09/01/2018 0634   GFRAA >60 09/01/2018 1610   Estimated Creatinine Clearance: 116.1 mL/min (by C-G formula based on SCr of 0.61 mg/dL).  COAG Lab Results  Component Value Date   INR 1.23 08/31/2018   Radiology Dg Chest 2 View  Result Date: 08/29/2018 CLINICAL DATA:  Fall. Shortness breath. Nausea and vomiting. EXAM: CHEST - 2 VIEW COMPARISON:  02/27/2011 FINDINGS: Moderate enlargement of the cardiopericardial silhouette. Atherosclerotic calcification of the aortic arch. No edema. Thoracic spondylosis. No blunting of the costophrenic angles to suggest pleural effusion. IMPRESSION: 1. Moderate enlargement of the cardiopericardial silhouette, without edema. 2.  Aortic Atherosclerosis (ICD10-I70.0). Electronically Signed   By: Gaylyn Rong M.D.   On: 08/29/2018 22:21   Ct Head Wo Contrast  Result Date: 08/29/2018 CLINICAL DATA:  Slurred speech. Fall tonight. Diabetes. Weakness. EXAM: CT HEAD WITHOUT CONTRAST TECHNIQUE:  Contiguous axial images were obtained from the base of the skull through the vertex  without intravenous contrast. COMPARISON:  None. FINDINGS: Brain: The brainstem, cerebellum, cerebral peduncles, thalami, basal ganglia, basilar cisterns, and ventricular system appear within normal limits. No intracranial hemorrhage, mass lesion, or acute CVA. Vascular: There is atherosclerotic calcification of the cavernous carotid arteries bilaterally. Skull: Unremarkable Sinuses/Orbits: Unremarkable Other: Unremarkable IMPRESSION: 1. No acute intracranial findings. 2. Atherosclerosis. Electronically Signed   By: Gaylyn RongWalter  Liebkemann M.D.   On: 08/29/2018 23:42   Ct Angio Chest Pe W Or Wo Contrast  Result Date: 08/31/2018 CLINICAL DATA:  Increased shortness of breath. Patient fell approximately 5 weeks ago. History of diabetes and chronic obstructive pulmonary disease. EXAM: CT ANGIOGRAPHY CHEST WITH CONTRAST TECHNIQUE: Multidetector CT imaging of the chest was performed using the standard protocol during bolus administration of intravenous contrast. Multiplanar CT image reconstructions and MIPs were obtained to evaluate the vascular anatomy. CONTRAST:  75mL OMNIPAQUE IOHEXOL 350 MG/ML SOLN COMPARISON:  Chest radiographs today.  Abdominal CT 08/29/2018. FINDINGS: Cardiovascular: The pulmonary arteries are well opacified with contrast to the level of the subsegmental branches. There are extensive pulmonary emboli involving the lobar and segmental pulmonary arteries bilaterally. There is mild involvement of the distal left pulmonary artery. There is diffuse atherosclerosis of the aorta, great vessels and coronary arteries. The heart is mildly enlarged. There is asymmetric dilatation of the right ventricle with an RV to LV ratio of 1.19, consistent with right ventricular strain. No significant pericardial effusion. Mediastinum/Nodes: There are no enlarged mediastinal, hilar or axillary lymph nodes. The thyroid gland, trachea and  esophagus demonstrate no significant findings. Lungs/Pleura: Small dependent left pleural effusion. No significant right pleural effusion or pneumothorax. Mild emphysema with dependent left lower lobe atelectasis. No confluent airspace opacity or suspicious pulmonary nodule. Upper abdomen: Diffuse morphologic changes of hepatic cirrhosis are again noted with a recanalized left paraumbilical vein and splenomegaly. Calcified gallstones are present. There is mild ascites. Musculoskeletal/Chest wall: There is no chest wall mass or suspicious osseous finding. An old rib fractures noted on the right. There is symmetric bilateral gynecomastia. Review of the MIP images confirms the above findings. IMPRESSION: 1. Large volume of pulmonary emboli in the lungs bilaterally, with dilatation of the right atrium and right ventricle (RV to LV ratio of 1.19) indicative of elevated right-sided heart pressures and right heart strain. These findings have been shown to be associated with a increased morbidity and mortality in the setting pulmonary embolism. 2. Coronary and Aortic Atherosclerosis (ICD10-I70.0). 3. Left pleural effusion and left lower lobe atelectasis. 4. Redemonstrated are findings consistent with hepatic cirrhosis and portal hypertension. 5. Critical Value/emergent results were called by telephone at the time of interpretation on 08/31/2018 at 5:45 pm to Dr. Shaune PollackQING CHEN , who verbally acknowledged these results. Electronically Signed   By: Carey BullocksWilliam  Veazey M.D.   On: 08/31/2018 17:43   Ct Abdomen Pelvis W Contrast  Result Date: 08/29/2018 CLINICAL DATA:  Weakness. Right upper quadrant abdominal pain. Slurred speech. EXAM: CT ABDOMEN AND PELVIS WITH CONTRAST TECHNIQUE: Multidetector CT imaging of the abdomen and pelvis was performed using the standard protocol following bolus administration of intravenous contrast. CONTRAST:  100mL ISOVUE-300 IOPAMIDOL (ISOVUE-300) INJECTION 61% COMPARISON:  Abdominal ultrasound  02/28/2011 FINDINGS: Lower chest: Right heart enlargement. Small left pleural effusion with adjacent atelectasis. Descending aortic atherosclerotic vascular calcification. Hepatobiliary: Nodular hepatic contour compatible cirrhosis. Gallstones layering in the gallbladder with indistinct and potentially mildly thickened gallbladder wall. Pancreas: Mild indistinctness of pancreatic margin is probably incidental although could be a manifestation of pancreatitis. This is more notable  in the pancreatic tail. Spleen: The spleen measures 15.8 by 8.3 by 17.4 cm (volume = 1200 cm^3). No focal splenic lesion is identified. Adrenals/Urinary Tract: An exophytic 9 mm lesion from the right mid kidney measures 29 Hounsfield units on portal venous phase and 28 Hounsfield units on delayed phase images, the lack of significant change probably indicates a lack of de-enhancement which would favor a complex but benign renal cyst. 2 mm left mid upper kidney nonobstructive renal calculus, image 69/5. Bilateral perirenal stranding. 1.4 by 1.1 cm left mid kidney fluid density lesion posteriorly compatible with cyst. Stomach/Bowel: No dilated bowel identified. Appendix surgically absent. Vascular/Lymphatic: Splenorenal shunting. Recanalized umbilical vein. 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 Accentuated vasculature in the omentum. Patent portal vein. Aortoiliac atherosclerotic vascular disease. 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 Reactive lymph nodes in the porta hepatis. Reproductive: Punctate calcification in the right prostate gland, otherwise unremarkable. Other: Trace fluid along the paracolic gutters. Musculoskeletal: Unremarkable IMPRESSION: 1. Hepatic cirrhosis with portal venous hypertension, recanalized umbilical vein, and splenorenal shunting 2. Mild indistinctness of the pancreatic margin is probably incidental, less likely to be due to pancreatitis. Correlate  with lipase levels. 3. Cholelithiasis. 4. Small left pleural effusion with adjacent atelectasis. 5. 2 mm left mid upper kidney nonobstructive renal calculus. 6. Aortoiliac atherosclerotic vascular disease. 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 7. Right heart enlargement. 8. Splenomegaly. 9. 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 Electronically Signed   By: Gaylyn Rong M.D.   On: 08/29/2018 23:52   US Carotid Bilateral  Result Date: 08/30/2018 CLINICAL DATA:  67 year old male with syncope EXAM: BILATERAL CAROTID DUPLEX ULTRASOUND TECHNIQUE: Wallace Cullens scale imaging, color Doppler and duplex ultrasound were performed of bilateral carotid and vertebral arteries in the neck. COMPARISON:  None. FINDINGS: Criteria: Quantification of carotid stenosis is based on velocity parameters that correlate the residual internal carotid diameter with NASCET-based stenosis levels, using the diameter of the distal internal carotid lumen as the denominator for stenosis measurement. The following velocity measurements were obtained: RIGHT ICA: 80/26 cm/sec CCA: 87/28 cm/sec SYSTOLIC ICA/CCA RATIO:  0.9 ECA:  80 cm/sec LEFT ICA: 79/38 cm/sec CCA: 85/25 cm/sec SYSTOLIC ICA/CCA RATIO:  0.9 ECA:  92 cm/sec RIGHT CAROTID ARTERY: Modest heterogeneous atherosclerotic plaque in the proximal internal carotid artery. By peak systolic velocity criteria, the estimated stenosis remains less than 50%. RIGHT VERTEBRAL ARTERY:  Patent with antegrade flow. LEFT CAROTID ARTERY: Mild heterogeneous atherosclerotic plaque in the proximal internal carotid artery. By peak systolic velocity criteria, the estimated stenosis will be less than 50%. LEFT VERTEBRAL ARTERY:  Patent with normal antegrade flow. IMPRESSION: 1. Mild (1-49%) stenosis proximal right internal carotid artery secondary to heterogenous atherosclerotic plaque. 2. Mild (1-49%) stenosis proximal left internal carotid artery  secondary to heterogenous atherosclerotic plaque. 3. Vertebral arteries are patent with normal antegrade flow. Signed, Sterling Big, MD, RPVI [facility] Radiology Electronically Signed   By: Malachy Moan M.D.   On: 08/30/2018 11:39   US Venous Img Lower Bilateral  Result Date: 08/31/2018 CLINICAL DATA:  Tachycardia EXAM: BILATERAL LOWER EXTREMITY VENOUS DOPPLER ULTRASOUND TECHNIQUE: Gray-scale sonography with graded compression, as well as color Doppler and duplex ultrasound were performed to evaluate the lower extremity deep venous systems from the level of the common femoral vein and including the common femoral, femoral, profunda femoral, popliteal and calf veins including the  posterior tibial, peroneal and gastrocnemius veins when visible. The superficial great saphenous vein was also interrogated. Spectral Doppler was utilized to evaluate flow at rest and with distal augmentation maneuvers in the common femoral, femoral and popliteal veins. COMPARISON:  None. FINDINGS: RIGHT LOWER EXTREMITY Common Femoral Vein: No evidence of thrombus. Normal compressibility, respiratory phasicity and response to augmentation. Saphenofemoral Junction: No evidence of thrombus. Normal compressibility and flow on color Doppler imaging. Profunda Femoral Vein: No evidence of thrombus. Normal compressibility and flow on color Doppler imaging. Femoral Vein: Occlusive thrombus noted in the mid and distal femoral vein. Vessel is noncompressible. Popliteal Vein: Occlusive thrombus noted in the popliteal vein. Vessel is noncompressible. Calf Veins: Occlusive thrombus extends in to posterior tibial vein. Superficial Great Saphenous Vein: No evidence of thrombus. Normal compressibility. Venous Reflux:  None. Other Findings:  None. LEFT LOWER EXTREMITY Common Femoral Vein: No evidence of thrombus. Normal compressibility, respiratory phasicity and response to augmentation.  Saphenofemoral Junction: No evidence of thrombus. Normal compressibility and flow on color Doppler imaging. Profunda Femoral Vein: No evidence of thrombus. Normal compressibility and flow on color Doppler imaging. Femoral Vein: No evidence of thrombus. Normal compressibility, respiratory phasicity and response to augmentation. Popliteal Vein: No evidence of thrombus. Normal compressibility, respiratory phasicity and response to augmentation. Calf Veins: No evidence of thrombus. Normal compressibility and flow on color Doppler imaging. Superficial Great Saphenous Vein: No evidence of thrombus. Normal compressibility. Venous Reflux:  None. Other Findings:  None. IMPRESSION: Occlusive thrombus from the mid right femoral vein through the popliteal vein into the posterior tibial vein. Electronically Signed   By: Charlett Nose M.D.   On: 08/31/2018 17:25   Assessment/Plan The patient is a 67 year old male with a past medical history of GI bleed, diabetes, COPD, alcohol abuse, coronary artery disease, paroxysmal atrial fibrillation, hypothyroidism, who presented to the [facility] emergency department with a history of multiple "recent falls" however found to have right lower extremity DVT and bilateral PE 1. PE / DVT: Patient with multiple falls over the last few days due to "weakened lower extremities".  Trauma could be possible source of DVT.  Normally, would recommend oral anticoagulation with Eliquis for at least one year.  At this time, the patient is thrombocytopenic and is very hesitant to be placed on anticoagulation.  This would place the patient at a greater risk for propagation of his DVT/PE.  If the patient continues to refuse oral anticoagulation would recommend placement of an IVC filter.  Placement IVC filter would at least protect him from any additional DVT traveling to the lungs.  It would not protect him from worsening of his current PEs.  Procedure, risks and benefits  explained to the patient.  All questions answered.  At this time, the patient would like to discuss the IVC filter placement with his wife.   2. Diabetes: On appropriate medications. Encouraged good control as its slows the progression of atherosclerotic disease 3. CAD: On appropriate medications.  This is followed by his cardiologist.  Discussed with Dr. Weldon Inches, PA-C  09/01/2018 12:05 PM  This note was created with Dragon medical transcription system.  Any error is purely unintentional.

## 2018-09-01 NOTE — Progress Notes (Signed)
[employer] - [place] at [facility]   PATIENT NAME: Lawrence Osborn    MR#:  [id number]  DATE OF BIRTH:  Oct 21, 1950  SUBJECTIVE:  CHIEF COMPLAINT:   Chief Complaint  Patient presents with  . Fall  . Shortness of Breath   The patient has no complaints.  But heart rate is still about 110s.  Up to 160 when the patient moved.  He is on an off oxygen 1 L. REVIEW OF SYSTEMS:  Review of Systems  Constitutional: Negative for chills, fever and malaise/fatigue.  HENT: Negative for sore throat.   Eyes: Negative for blurred vision and double vision.  Respiratory: Negative for cough, hemoptysis, shortness of breath, wheezing and stridor.   Cardiovascular: Negative for chest pain, palpitations, orthopnea and leg swelling.  Gastrointestinal: Negative for abdominal pain, blood in stool, diarrhea, melena, nausea and vomiting.  Genitourinary: Negative for dysuria, flank pain and hematuria.  Musculoskeletal: Negative for back pain and joint pain.  Skin: Negative for rash.  Neurological: Negative for dizziness, sensory change, focal weakness, seizures, loss of consciousness, weakness and headaches.  Endo/Heme/Allergies: Negative for polydipsia.  Psychiatric/Behavioral: Negative for depression. The patient is not nervous/anxious.     DRUG ALLERGIES:  No Known Allergies VITALS:  Blood pressure 110/73, pulse (!) 117, temperature 98.5 F (36.9 C), temperature source Oral, resp. rate 19, height 5\' 8"  (1.727 m), weight 120.5 kg, SpO2 95 %. PHYSICAL EXAMINATION:  Physical Exam Constitutional:      General: He is not in acute distress.    Comments: Morbid obesity.  HENT:     Head: Normocephalic.     Mouth/Throat:     Mouth: Mucous membranes are moist.  Eyes:     General: No scleral icterus.    Conjunctiva/sclera: Conjunctivae normal.     Pupils: Pupils are equal, round, and reactive to light.  Neck:     Musculoskeletal: Normal range of motion and neck supple.     Vascular: No  JVD.     Trachea: No tracheal deviation.  Cardiovascular:     Rate and Rhythm: Normal rate and regular rhythm.     Heart sounds: Normal heart sounds. No murmur. No gallop.   Pulmonary:     Effort: Pulmonary effort is normal. No respiratory distress.     Breath sounds: Normal breath sounds. No stridor. No wheezing, rhonchi or rales.  Abdominal:     General: Bowel sounds are normal. There is no distension.     Palpations: Abdomen is soft.     Tenderness: There is no abdominal tenderness. There is no rebound.  Musculoskeletal: Normal range of motion.        General: No tenderness.     Right lower leg: No edema.     Left lower leg: No edema.  Skin:    Findings: No erythema or rash.  Neurological:     Mental Status: He is alert and oriented to person, place, and time.     Cranial Nerves: No cranial nerve deficit.  Psychiatric:        Mood and Affect: Mood normal.    LABORATORY PANEL:  Male CBC Recent Labs  Lab 09/01/18 0634  WBC 3.2*  HGB 12.3*  HCT 37.9*  PLT 61*   ------------------------------------------------------------------------------------------------------------------ Chemistries  Recent Labs  Lab 08/29/18 2227  09/01/18 0634  NA 133*   < > 137  K 4.3   < > 3.9  CL 102   < > 102  CO2 20*   < >  27  GLUCOSE 124*   < > 108*  BUN 13   < > 11  CREATININE 0.88   < > 0.61  CALCIUM 7.9*   < > 8.2*  AST 47*  --   --   ALT 18  --   --   ALKPHOS 107  --   --   BILITOT 1.5*  --   --    < > = values in this interval not displayed.   RADIOLOGY:  Ct Angio Chest Pe W Or Wo Contrast  Result Date: 08/31/2018 CLINICAL DATA:  Increased shortness of breath. Patient fell approximately 5 weeks ago. History of diabetes and chronic obstructive pulmonary disease. EXAM: CT ANGIOGRAPHY CHEST WITH CONTRAST TECHNIQUE: Multidetector CT imaging of the chest was performed using the standard protocol during bolus administration of intravenous contrast. Multiplanar CT image  reconstructions and MIPs were obtained to evaluate the vascular anatomy. CONTRAST:  75mL OMNIPAQUE IOHEXOL 350 MG/ML SOLN COMPARISON:  Chest radiographs today.  Abdominal CT 08/29/2018. FINDINGS: Cardiovascular: The pulmonary arteries are well opacified with contrast to the level of the subsegmental branches. There are extensive pulmonary emboli involving the lobar and segmental pulmonary arteries bilaterally. There is mild involvement of the distal left pulmonary artery. There is diffuse atherosclerosis of the aorta, great vessels and coronary arteries. The heart is mildly enlarged. There is asymmetric dilatation of the right ventricle with an RV to LV ratio of 1.19, consistent with right ventricular strain. No significant pericardial effusion. Mediastinum/Nodes: There are no enlarged mediastinal, hilar or axillary lymph nodes. The thyroid gland, trachea and esophagus demonstrate no significant findings. Lungs/Pleura: Small dependent left pleural effusion. No significant right pleural effusion or pneumothorax. Mild emphysema with dependent left lower lobe atelectasis. No confluent airspace opacity or suspicious pulmonary nodule. Upper abdomen: Diffuse morphologic changes of hepatic cirrhosis are again noted with a recanalized left paraumbilical vein and splenomegaly. Calcified gallstones are present. There is mild ascites. Musculoskeletal/Chest wall: There is no chest wall mass or suspicious osseous finding. An old rib fractures noted on the right. There is symmetric bilateral gynecomastia. Review of the MIP images confirms the above findings. IMPRESSION: 1. Large volume of pulmonary emboli in the lungs bilaterally, with dilatation of the right atrium and right ventricle (RV to LV ratio of 1.19) indicative of elevated right-sided heart pressures and right heart strain. These findings have been shown to be associated with a increased morbidity and mortality in the setting pulmonary embolism. 2. Coronary and Aortic  Atherosclerosis (ICD10-I70.0). 3. Left pleural effusion and left lower lobe atelectasis. 4. Redemonstrated are findings consistent with hepatic cirrhosis and portal hypertension. 5. Critical Value/emergent results were called by telephone at the time of interpretation on 08/31/2018 at 5:45 pm to Dr. Shaune Pollack , who verbally acknowledged these results. Electronically Signed   By: Carey Bullocks M.D.   On: 08/31/2018 17:43   US Venous Img Lower Bilateral  Result Date: 08/31/2018 CLINICAL DATA:  Tachycardia EXAM: BILATERAL LOWER EXTREMITY VENOUS DOPPLER ULTRASOUND TECHNIQUE: Gray-scale sonography with graded compression, as well as color Doppler and duplex ultrasound were performed to evaluate the lower extremity deep venous systems from the level of the common femoral vein and including the common femoral, femoral, profunda femoral, popliteal and calf veins including the posterior tibial, peroneal and gastrocnemius veins when visible. The superficial great saphenous vein was also interrogated. Spectral Doppler was utilized to evaluate flow at rest and with distal augmentation maneuvers in the common femoral, femoral and popliteal veins. COMPARISON:  None. FINDINGS:  RIGHT LOWER EXTREMITY Common Femoral Vein: No evidence of thrombus. Normal compressibility, respiratory phasicity and response to augmentation. Saphenofemoral Junction: No evidence of thrombus. Normal compressibility and flow on color Doppler imaging. Profunda Femoral Vein: No evidence of thrombus. Normal compressibility and flow on color Doppler imaging. Femoral Vein: Occlusive thrombus noted in the mid and distal femoral vein. Vessel is noncompressible. Popliteal Vein: Occlusive thrombus noted in the popliteal vein. Vessel is noncompressible. Calf Veins: Occlusive thrombus extends in to posterior tibial vein. Superficial Great Saphenous Vein: No evidence of thrombus. Normal compressibility. Venous Reflux:  None. Other Findings:  None. LEFT LOWER  EXTREMITY Common Femoral Vein: No evidence of thrombus. Normal compressibility, respiratory phasicity and response to augmentation. Saphenofemoral Junction: No evidence of thrombus. Normal compressibility and flow on color Doppler imaging. Profunda Femoral Vein: No evidence of thrombus. Normal compressibility and flow on color Doppler imaging. Femoral Vein: No evidence of thrombus. Normal compressibility, respiratory phasicity and response to augmentation. Popliteal Vein: No evidence of thrombus. Normal compressibility, respiratory phasicity and response to augmentation. Calf Veins: No evidence of thrombus. Normal compressibility and flow on color Doppler imaging. Superficial Great Saphenous Vein: No evidence of thrombus. Normal compressibility. Venous Reflux:  None. Other Findings:  None. IMPRESSION: Occlusive thrombus from the mid right femoral vein through the popliteal vein into the posterior tibial vein. Electronically Signed   By: Charlett NoseKevin  Dover M.D.   On: 08/31/2018 17:25   ASSESSMENT AND PLAN:   Syncope -patient and his wife describe multiple episodes recently where he has fallen and had brief loss of consciousness or near loss of consciousness. Troponin was negative.Echocardiograph is unremarkable. Carotid ultrasound is unremarkable.  Possible due to hypotension or orthostatic hypotension. The patient needs Holter monitor as outpatient per Dr. Lady GaryFath.  Hypotensive episode -likely dehydration.  Blood pressure is normal but low side.  Hold nadolol.  Lactic acidosis. Improved with IV fluid.  PAF (paroxysmal atrial fibrillation)with RVR Hold nadolol due to low side blood pressure. Continue aspirin and digoxin. Per Dr. Lady GaryFath, given his chronic thrombocytopenia, the patient and his wife are preferring not to proceed with chronic anticoagulation. At present given no sustained atrial fibrillation during this visit would defer chronic anticoagulation for now.  Follow-up Dr. Lady GaryFath as  outpatient.  Diabetes (HCC) -sliding scale insulin coverage, resume home medication after discharge. Alcohol use - CIWA protocol, patient has mild fever but no anxiety or withdrawal symptoms. CAD (coronary artery disease) -continue home meds, other work-up as above COPD.  Stable. Hypothyroidism -home dose thyroid replacement. Morbid obesity.  Diet control and exercise was advised.  Acute respiratory failure with hypoxia due to PE and DVT.  Continue heparin drip.  Follow-up vascular surgeon possible IVC filter placement.  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 The patient has concerns about oral anticoagulation.  Oncology consult.  Follow-up CBC.  All the records are reviewed and case discussed with Care Management/Social Worker. Management plans discussed with the patient, family and they are in agreement.  CODE STATUS: Full Code  TOTAL TIME TAKING CARE OF THIS PATIENT: 48 minutes.   More than 50% of the time was spent in counseling/coordination of care: YES  POSSIBLE D/C IN 2 DAYS, DEPENDING ON CLINICAL CONDITION.   Shaune PollackQing Ayaz Sondgeroth M.D on 09/01/2018 at 5:12 PM  Between 7am to 6pm - Pager - [phone]  After 6pm go to [email or web address] - Therapist, nutritionalpassword EPAS [facility]  [employer] [place] [facility]

## 2018-09-02 DIAGNOSIS — I824Y1 Acute embolism and thrombosis of unspecified deep veins of right proximal lower extremity: Secondary | ICD-10-CM

## 2018-09-02 DIAGNOSIS — I2699 Other pulmonary embolism without acute cor pulmonale: Secondary | ICD-10-CM

## 2018-09-02 DIAGNOSIS — D696 Thrombocytopenia, unspecified: Secondary | ICD-10-CM

## 2018-09-02 LAB — CBC WITH DIFFERENTIAL/PLATELET
Abs Immature Granulocytes: 0.02 10*3/uL (ref 0.00–0.07)
Basophils Absolute: 0 10*3/uL (ref 0.0–0.1)
Basophils Relative: 0 %
Eosinophils Absolute: 0.1 10*3/uL (ref 0.0–0.5)
Eosinophils Relative: 3 %
HCT: 37.5 % — ABNORMAL LOW (ref 39.0–52.0)
Hemoglobin: 12.3 g/dL — ABNORMAL LOW (ref 13.0–17.0)
Immature Granulocytes: 0 %
Lymphocytes Relative: 12 %
Lymphs Abs: 0.6 10*3/uL — ABNORMAL LOW (ref 0.7–4.0)
MCH: 30.6 pg (ref 26.0–34.0)
MCHC: 32.8 g/dL (ref 30.0–36.0)
MCV: 93.3 fL (ref 80.0–100.0)
Monocytes Absolute: 0.7 10*3/uL (ref 0.1–1.0)
Monocytes Relative: 15 %
Neutro Abs: 3.3 10*3/uL (ref 1.7–7.7)
Neutrophils Relative %: 70 %
Platelets: 67 10*3/uL — ABNORMAL LOW (ref 150–400)
RBC: 4.02 MIL/uL — ABNORMAL LOW (ref 4.22–5.81)
RDW: 13.9 % (ref 11.5–15.5)
WBC: 4.8 10*3/uL (ref 4.0–10.5)
nRBC: 0 % (ref 0.0–0.2)

## 2018-09-02 LAB — VITAMIN B12: Vitamin B-12: 510 pg/mL (ref 180–914)

## 2018-09-02 LAB — GLUCOSE, CAPILLARY
Glucose-Capillary: 122 mg/dL — ABNORMAL HIGH (ref 70–99)
Glucose-Capillary: 146 mg/dL — ABNORMAL HIGH (ref 70–99)
Glucose-Capillary: 133 mg/dL — ABNORMAL HIGH (ref 70–99)
Glucose-Capillary: 173 mg/dL — ABNORMAL HIGH (ref 70–99)

## 2018-09-02 LAB — FERRITIN: Ferritin: 40 ng/mL (ref 24–336)

## 2018-09-02 LAB — HEPARIN LEVEL (UNFRACTIONATED)
Heparin Unfractionated: 0.23 IU/mL — ABNORMAL LOW (ref 0.30–0.70)
Heparin Unfractionated: 0.41 IU/mL (ref 0.30–0.70)

## 2018-09-02 LAB — HIV ANTIBODY (ROUTINE TESTING W REFLEX): HIV Screen 4th Generation wRfx: NONREACTIVE

## 2018-09-02 LAB — FOLATE: Folate: 21.6 ng/mL (ref >5.9)

## 2018-09-02 MED ORDER — APIXABAN 5 MG PO TABS: 5.0000 mg | ORAL_TABLET | Freq: Two times a day (BID) | ORAL | Status: DC

## 2018-09-02 MED ORDER — NADOLOL 40 MG PO TABS
40.0000 mg | ORAL_TABLET | Freq: Two times a day (BID) | ORAL | Status: DC
  Administered 2018-09-02: 40 mg via ORAL
  Filled 2018-09-02 (×2): qty 1

## 2018-09-02 MED ORDER — NADOLOL 40 MG PO TABS
80.0000 mg | ORAL_TABLET | Freq: Two times a day (BID) | ORAL | Status: DC
  Administered 2018-09-02 – 2018-09-03 (×2): 80 mg via ORAL
  Filled 2018-09-02 (×3): qty 2

## 2018-09-02 MED ORDER — HEPARIN BOLUS VIA INFUSION
1400.0000 [IU] | Freq: Once | INTRAVENOUS | Status: AC
  Administered 2018-09-02: 1400 [IU] via INTRAVENOUS
  Filled 2018-09-02: qty 1400

## 2018-09-02 MED ORDER — APIXABAN 5 MG PO TABS
10.0000 mg | ORAL_TABLET | Freq: Two times a day (BID) | ORAL | Status: DC
  Administered 2018-09-02 – 2018-09-03 (×2): 10 mg via ORAL
  Filled 2018-09-02 (×2): qty 2

## 2018-09-02 NOTE — Progress Notes (Signed)
Pt refused bed alarm but was educated about safety.. Will continue to monitor. 

## 2018-09-02 NOTE — Progress Notes (Addendum)
[employer] - [place] at [facility]   PATIENT NAME: Lawrence Osborn    MR#:  [id number]  DATE OF BIRTH:  [birth year]  SUBJECTIVE:  CHIEF COMPLAINT:   Chief Complaint  Patient presents with  . Fall  . Shortness of Breath   The patient has no complaints.  But heart rate is still about 110s.  Up to 160 when the patient moved.  He is on an off oxygen 1 L. REVIEW OF SYSTEMS:  Review of Systems  Constitutional: Negative for chills, fever and malaise/fatigue.  HENT: Negative for sore throat.   Eyes: Negative for blurred vision and double vision.  Respiratory: Negative for cough, hemoptysis, shortness of breath, wheezing and stridor.   Cardiovascular: Negative for chest pain, palpitations, orthopnea and leg swelling.  Gastrointestinal: Negative for abdominal pain, blood in stool, diarrhea, melena, nausea and vomiting.  Genitourinary: Negative for dysuria, flank pain and hematuria.  Musculoskeletal: Negative for back pain and joint pain.  Skin: Negative for rash.  Neurological: Negative for dizziness, sensory change, focal weakness, seizures, loss of consciousness, weakness and headaches.  Endo/Heme/Allergies: Negative for polydipsia.  Psychiatric/Behavioral: Negative for depression. The patient is not nervous/anxious.     DRUG ALLERGIES:  No Known Allergies VITALS:  Blood pressure (!) 141/90, pulse (!) 114, temperature 98.9 F (37.2 C), temperature source Oral, resp. rate 20, height 5\' 8"  (1.727 m), weight 119.1 kg, SpO2 98 %. PHYSICAL EXAMINATION:  Physical Exam Constitutional:      General: He is not in acute distress.    Comments: Morbid obesity.  HENT:     Head: Normocephalic.     Mouth/Throat:     Mouth: Mucous membranes are moist.  Eyes:     General: No scleral icterus.    Conjunctiva/sclera: Conjunctivae normal.     Pupils: Pupils are equal, round, and reactive to light.  Neck:     Musculoskeletal: Normal range of motion and neck supple.   Vascular: No JVD.     Trachea: No tracheal deviation.  Cardiovascular:     Rate and Rhythm: Normal rate and regular rhythm.     Heart sounds: Normal heart sounds. No murmur. No gallop.   Pulmonary:     Effort: Pulmonary effort is normal. No respiratory distress.     Breath sounds: Normal breath sounds. No stridor. No wheezing, rhonchi or rales.  Abdominal:     General: Bowel sounds are normal. There is no distension.     Palpations: Abdomen is soft.     Tenderness: There is no abdominal tenderness. There is no rebound.  Musculoskeletal: Normal range of motion.        General: No tenderness.     Right lower leg: No edema.     Left lower leg: No edema.  Skin:    Findings: No erythema or rash.  Neurological:     Mental Status: He is alert and oriented to person, place, and time.     Cranial Nerves: No cranial nerve deficit.  Psychiatric:        Mood and Affect: Mood normal.    LABORATORY PANEL:  Male CBC Recent Labs  Lab 09/02/18 0400  WBC 4.8  HGB 12.3*  HCT 37.5*  PLT 67*   ------------------------------------------------------------------------------------------------------------------ Chemistries  Recent Labs  Lab 08/29/18 2227  09/01/18 0634  NA 133*   < > 137  K 4.3   < > 3.9  CL 102   < > 102  CO2 20*   < > 27  GLUCOSE 124*   < > 108*  BUN 13   < > 11  CREATININE 0.88   < > 0.61  CALCIUM 7.9*   < > 8.2*  AST 47*  --   --   ALT 18  --   --   ALKPHOS 107  --   --   BILITOT 1.5*  --   --    < > = values in this interval not displayed.   RADIOLOGY:  No results found. ASSESSMENT AND PLAN:   Syncope -patient and his wife describe multiple episodes recently where he has fallen and had brief loss of consciousness or near loss of consciousness. Troponin was negative.Echocardiograph is unremarkable. Carotid ultrasound is unremarkable.  Possible due to hypotension or orthostatic hypotension or PE. The patient needs Holter monitor as outpatient per Dr.  Lady GaryFath.  Hypotensive episode -likely dehydration.  Blood pressure was normal but low side.  Hold nadolol. Resume nadolol twice daily due to elevated blood pressure.  Lactic acidosis. Improved with IV fluid.  PAF (paroxysmal atrial fibrillation)with RVR Resume  nadolol. Continue aspirin and digoxin. Per Dr. Lady GaryFath, given his chronic thrombocytopenia, the patient and his wife are preferring not to proceed with chronic anticoagulation. At present given no sustained atrial fibrillation during this visit would defer chronic anticoagulation for now.  Follow-up Dr. Lady GaryFath as outpatient.  Diabetes (HCC) -sliding scale insulin coverage, resume home medication after discharge.  Alcohol use - CIWA protocol, patient has mild fever but no anxiety or withdrawal symptoms.  CAD (coronary artery disease) -continue home meds.  COPD.  Stable. Hypothyroidism -home dose thyroid replacement. Morbid obesity.  Diet control and exercise was advised.  Acute respiratory failure with hypoxia due to PE and DVT.  He is on heparin drip.  Dr. Lady GaryFath suggest Eliquis. The patient has concerns about oral anticoagulation.  After discussion with his wife, he agreed to start Eliquis. Eliquis pharmacy to dose, discontinue heparin drip.  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 Clinically stable.  All the records are reviewed and case discussed with Care Management/Social Worker. Management plans discussed with the patient, family and they are in agreement.  CODE STATUS: Full Code  TOTAL TIME TAKING CARE OF THIS PATIENT: 28 minutes.   More than 50% of the time was spent in counseling/coordination of care: YES  POSSIBLE D/C IN 2 DAYS, DEPENDING ON CLINICAL CONDITION.   Shaune PollackQing Sharnika Binney M.D on 09/02/2018 at 3:46 PM  Between 7am to 6pm - Pager - [phone]  After 6pm go to [email or web address] - Therapist, nutritionalpassword EPAS [facility]  [facility] [employer] [place] [facility]

## 2018-09-02 NOTE — Progress Notes (Addendum)
ANTICOAGULATION CONSULT NOTE  Pharmacy Consult for heparin Indication: pulmonary embolus  No Known Allergies  Patient Measurements: Height: 5\' 8"  (172.7 cm) Weight: 262 lb 8 oz (119.1 kg) IBW/kg (Calculated) : 68.4 Heparin Dosing Weight: 94.1 kg  Vital Signs: Temp: 98.9 F (37.2 C) (12/17 0733) Temp Source: Oral (12/17 0733) BP: 141/90 (12/17 0733) Pulse Rate: 114 (12/17 0820)  Labs: Recent Labs    08/31/18 1601 08/31/18 2202 09/01/18 0634 09/02/18 0400  HGB 13.0  --  12.3* 12.3*  HCT 40.2  --  37.9* 37.5*  PLT 70*  --  61* 67*  APTT 31  --   --   --   LABPROT 15.4*  --   --   --   INR 1.23  --   --   --   HEPARINUNFRC  --  0.33 0.35 0.23*  CREATININE  --   --  0.61  --     Estimated Creatinine Clearance: 115.5 mL/min (by C-G formula based on SCr of 0.61 mg/dL).   Medical History: Past Medical History:  Diagnosis Date  . COPD (chronic obstructive pulmonary disease) (HCC)   . Diabetes mellitus without complication (HCC)   . GI bleed   . Pneumothorax     Assessment: [age] year old male with elevated d-dimer, concern for PE. No anticoagulation PTA or while here. Pharmacy consulted for heparin dosing. Patient noted to have low platelets, MD Imogene Burnhen aware of low platelets.   12/15 Heparin started at 1500 units/hr  12/15 22:00 HL 0.33 12/16 06:30 HL 0.35, 2nd therapeutic level 12/17 04:00 HL 0.23, bolus 1400units and increase drip rate to 1700 units/hr   Platelets 70>>61>>67  Goal of Therapy:  Heparin level 0.3-0.7 units/ml Monitor platelets by anticoagulation protocol: Yes   Plan:  Platelets low but stable.  Subtherapeutic Heparin level this morning. Will order Heparin 1400 units IV bolus and increase rate to 1700 units/hr. Next Heparin level will be in 6 hours. Daily CBC while on Heparin drip.  Clovia CuffLisa Latonyia Lopata, PharmD, BCPS 09/02/2018 9:18 AM

## 2018-09-02 NOTE — Progress Notes (Signed)
ANTICOAGULATION CONSULT NOTE  Pharmacy Consult for heparin Indication: pulmonary embolus  No Known Allergies  Patient Measurements: Height: 5\' 8"  (172.7 cm) Weight: 262 lb 8 oz (119.1 kg) IBW/kg (Calculated) : 68.4 Heparin Dosing Weight: 94.1 kg  Vital Signs: Temp: 98.9 F (37.2 C) (12/17 0733) Temp Source: Oral (12/17 0733) BP: 141/90 (12/17 0733) Pulse Rate: 114 (12/17 0820)  Labs: Recent Labs    08/31/18 1601  09/01/18 0634 09/02/18 0400 09/02/18 1521  HGB 13.0  --  12.3* 12.3*  --   HCT 40.2  --  37.9* 37.5*  --   PLT 70*  --  61* 67*  --   APTT 31  --   --   --   --   LABPROT 15.4*  --   --   --   --   INR 1.23  --   --   --   --   HEPARINUNFRC  --    < > 0.35 0.23* 0.41  CREATININE  --   --  0.61  --   --    < > = values in this interval not displayed.    Estimated Creatinine Clearance: 115.5 mL/min (by C-G formula based on SCr of 0.61 mg/dL).   Medical History: Past Medical History:  Diagnosis Date  . COPD (chronic obstructive pulmonary disease) (HCC)   . Diabetes mellitus without complication (HCC)   . GI bleed   . Pneumothorax     Assessment: 67 year old male with elevated d-dimer, concern for PE. No anticoagulation PTA or while here. Pharmacy consulted for heparin dosing. Patient noted to have low platelets, MD Imogene Burnhen aware of low platelets.   12/15 Heparin started at 1500 units/hr  12/15 22:00 HL 0.33 12/16 06:30 HL 0.35, 2nd therapeutic level 12/17 04:00 HL 0.23, bolus 1400units and increase drip rate to 1700 units/hr  12/17 1521  HL 0.41 - 1st therapeutic level  Platelets 70>>61>>67  Goal of Therapy:  Heparin level 0.3-0.7 units/ml Monitor platelets by anticoagulation protocol: Yes   Plan:  Platelets low but stable.  Therapeutic Heparin level. Will continue Heparin rate 1700 units/hr. Next Heparin level will be in 6 hours. Daily CBC while on Heparin drip.  Abbie Sonshris Thorn Demas, PharmD Clinical Pharmacist 09/02/2018 3:56 PM

## 2018-09-02 NOTE — Significant Event (Signed)
Rapid Response Event Note  Overview: Time Called: 0715 Arrival Time: 0716 Event Type: Cardiac  Initial Focused Assessment: Rapid response RN arrived in patient's room with patient resting in bed. Per report from patient's RN, patient had been sitting on the side of the bed and his heart rate went into the 150s. Then when he stood next to the bed his heart rate went into the 230s. Both were narrow complexes and appeared to be SVT. Patient reports no shortness of breath, chest pain, or dizziness either during the event or currently. Vital signs while in bed: HR 115 ST, BP 147/96 (MAP 112), Oxygen saturations 98% on 2L nasal canula.   Interventions: 12 lead EKG obtained which machine read as aflutter but patient's RN Dorna MaiJancy and this RN felt appeared to ST.   Plan of Care (if not transferred): [facility] messaged cardiologist Dr. Lady GaryFath and Dr. Imogene Burnhen for orders to check patient's electrolytes. Dorna MaiJancy RN also messaged pharmacy to get patient's beta blocker and digoxin to administer to patient. Patient informed to rest in the bed and not get up right now until heart more stable. Maria NT placed bed alarm; patient's call light in reach.  Event Summary: Name of Physician Notified: Dr. Imogene Burnhen at [phone]  Name of Consulting Physician Notified: Dr. Lady GaryFath at [phone]  Outcome: Stayed in room and stabalized  Event End Time: 0732  Adron BeneMOORE, Erikah [facility] SquareELIZABETH

## 2018-09-02 NOTE — Progress Notes (Signed)
Chaplain responded to a Rapid Response. Pt was alert and talking. Chaplain held space and prayer for Pt and care team.    09/02/18 0700  Clinical Encounter Type  Visited With Patient  Visit Type Code  Referral From Care management  Spiritual Encounters  Spiritual Needs Prayer

## 2018-09-02 NOTE — Plan of Care (Signed)
  Problem: Safety: Goal: Ability to remain free from injury will improve Outcome: Progressing   Problem: Clinical Measurements: Goal: Ability to maintain clinical measurements within normal limits will improve Outcome: Progressing Goal: Cardiovascular complication will be avoided Outcome: Progressing

## 2018-09-02 NOTE — Progress Notes (Signed)
HR increased to 140's while sitting at the side of the bed.  Further increase to 231 while standing.  Rapid response called.  VS stable. BP 147/96.  O2 sats 98% on 1L [place]. EKG obtained.  HR decreased to 110- 120's after lying down.  Will give am Naldolol and Dig when it arrives from pharmacy.  Stat delivery has been requested.

## 2018-09-02 NOTE — Progress Notes (Signed)
ANTICOAGULATION CONSULT NOTE  Pharmacy Consult for apixaban Indication: PE/DVT/afib  No Known Allergies  Patient Measurements: Height: 5\' 8"  (172.7 cm) Weight: 262 lb 8 oz (119.1 kg) IBW/kg (Calculated) : 68.4 Heparin Dosing Weight: 94.1 kg  Vital Signs: Temp: 98.9 F (37.2 C) (12/17 0733) Temp Source: Oral (12/17 0733) BP: 141/90 (12/17 0733) Pulse Rate: 114 (12/17 0820)  Labs: Recent Labs    08/31/18 1601  09/01/18 0634 09/02/18 0400 09/02/18 1521  HGB 13.0  --  12.3* 12.3*  --   HCT 40.2  --  37.9* 37.5*  --   PLT 70*  --  61* 67*  --   APTT 31  --   --   --   --   LABPROT 15.4*  --   --   --   --   INR 1.23  --   --   --   --   HEPARINUNFRC  --    < > 0.35 0.23* 0.41  CREATININE  --   --  0.61  --   --    < > = values in this interval not displayed.    Estimated Creatinine Clearance: 115.5 mL/min (by C-G formula based on SCr of 0.61 mg/dL).   Medical History: Past Medical History:  Diagnosis Date  . COPD (chronic obstructive pulmonary disease) (HCC)   . Diabetes mellitus without complication (HCC)   . GI bleed   . Pneumothorax     Assessment: 67 year old male with elevated d-dimer, concern for PE. No anticoagulation PTA or while here. Patient noted to have low platelets, MD Imogene Burnhen aware of low platelets.   Patient to transition from heparin to apixaban.  Goal of Therapy:  Heparin level 0.3-0.7 units/ml Monitor platelets by anticoagulation protocol: Yes   Plan:  Will start apixaban 10 mg twice daily x 7 days, followed by 5 mg twice daily. First dose of apixaban to be given at the time heparin turned off. Will order for 1630.  CBC ordered for tomorrow. Will then monitor every 3 days per protocol.  Pricilla RiffleAbby K Ellington, PharmD Pharmacy Resident  09/02/2018 4:13 PM

## 2018-09-03 LAB — DIGOXIN LEVEL: Digoxin Level: 0.5 ng/mL — ABNORMAL LOW (ref 0.8–2.0)

## 2018-09-03 LAB — CBC
HCT: 39.7 % (ref 39.0–52.0)
Hemoglobin: 13 g/dL (ref 13.0–17.0)
MCH: 30.9 pg (ref 26.0–34.0)
MCHC: 32.7 g/dL (ref 30.0–36.0)
MCV: 94.3 fL (ref 80.0–100.0)
Platelets: 75 10*3/uL — ABNORMAL LOW (ref 150–400)
RBC: 4.21 MIL/uL — ABNORMAL LOW (ref 4.22–5.81)
RDW: 14.3 % (ref 11.5–15.5)
WBC: 4.4 10*3/uL (ref 4.0–10.5)
nRBC: 0 % (ref 0.0–0.2)

## 2018-09-03 LAB — GLUCOSE, CAPILLARY
Glucose-Capillary: 116 mg/dL — ABNORMAL HIGH (ref 70–99)
Glucose-Capillary: 201 mg/dL — ABNORMAL HIGH (ref 70–99)

## 2018-09-03 LAB — BETA-2-GLYCOPROTEIN I ABS, IGG/M/A
Beta-2 Glyco I IgG: <9 GPI IgG units (ref 0–20)
Beta-2-Glycoprotein I IgA: <9 GPI IgA units (ref 0–25)
Beta-2-Glycoprotein I IgM: 10 GPI IgM units (ref 0–32)

## 2018-09-03 LAB — HEPATITIS C ANTIBODY: HCV Ab: <0.1 s/co ratio (ref 0.0–0.9)

## 2018-09-03 LAB — HEPATITIS B SURFACE ANTIGEN: Hepatitis B Surface Ag: NEGATIVE

## 2018-09-03 LAB — H PYLORI, IGM, IGG, IGA AB
H Pylori IgG: 0.19 Index Value (ref 0.00–0.79)
H. Pylogi, Iga Abs: 22.2 units — ABNORMAL HIGH (ref 0.0–8.9)
H. Pylogi, Igm Abs: <9 units (ref 0.0–8.9)

## 2018-09-03 LAB — MAGNESIUM: Magnesium: 1.6 mg/dL — ABNORMAL LOW (ref 1.7–2.4)

## 2018-09-03 LAB — HEPATITIS B CORE ANTIBODY, TOTAL: Hep B Core Total Ab: NEGATIVE

## 2018-09-03 MED ORDER — MAGNESIUM SULFATE 2 GM/50ML IV SOLN
2.0000 g | Freq: Once | INTRAVENOUS | Status: AC
  Administered 2018-09-03: 2 g via INTRAVENOUS
  Filled 2018-09-03: qty 50

## 2018-09-03 MED ORDER — SENNA 8.6 MG PO TABS: 1.0000 | ORAL_TABLET | Freq: Every day | ORAL | Status: DC | PRN

## 2018-09-03 MED ORDER — APIXABAN 5 MG PO TABS: ORAL_TABLET | ORAL | 1 refills | Status: DC

## 2018-09-03 MED ORDER — BISACODYL 5 MG PO TBEC: 5.0000 mg | DELAYED_RELEASE_TABLET | Freq: Every day | ORAL | Status: DC | PRN

## 2018-09-03 NOTE — Discharge Instructions (Signed)
Alcohol cessation Fall precaution. HHPT

## 2018-09-03 NOTE — Care Management Note (Signed)
Case Management Note  Patient Details  Name: Milas Kocherhilip Guillotte MRN: [id number] Date of Birth: [birth year]  Subjective/Objective:    Patient is from home with wife.  Admitted with SOB and frequent falls.  Starting on Eliquis for PE/DVT.  Co-pay assist card and 30 day free coupon given.  He is currently under his wife's Nurse, learning disabilitycommercial insurance.  Also has artrial fibrillation.  Discharging today with wife.  Declines home health services.  Independent in all adls, denies issues accessing medical care, obtaining medications or with transportation.  Current with PCP.  No discharge needs identified at present by care manager or members of care team                 Action/Plan:   Expected Discharge Date:  09/03/18               Expected Discharge Plan:  Home/Self Care  In-House Referral:     Discharge planning Services  CM Consult  Post Acute Care Choice:    Choice offered to:     DME Arranged:    DME Agency:     HH Arranged:    HH Agency:     Status of Service:  Completed, signed off  If discussed at [employer] Length of Stay Meetings, dates discussed:    Additional Comments:  Sherren KernsJennifer L Ree Alcalde, RN 09/03/2018, 1:52 PM

## 2018-09-03 NOTE — Discharge Summary (Signed)
[facility] [place] [facility]   PATIENT NAME: Lawrence Osborn    MR#:  [id number]  DATE OF BIRTH:  September 19, 1950  DATE OF ADMISSION:  08/29/2018   ADMITTING PHYSICIAN: Oralia Manis, MD  DATE OF DISCHARGE: 09/03/2018  3:23 PM  PRIMARY CARE PHYSICIAN: Danella Penton, MD   ADMISSION DIAGNOSIS:  Syncope [R55] DISCHARGE DIAGNOSIS:  Principal Problem:   Syncope Active Problems:   Diabetes (HCC)   Alcohol use   Hypotensive episode   CAD (coronary artery disease)   PAF (paroxysmal atrial fibrillation) (HCC)   Hypothyroidism   Elevated d-dimer   Acute deep vein thrombosis (DVT) of proximal vein of right lower extremity (HCC)   Acute pulmonary embolism (HCC)   Thrombocytopenia (HCC)  SECONDARY DIAGNOSIS:   Past Medical History:  Diagnosis Date  . COPD (chronic obstructive pulmonary disease) (HCC)   . Diabetes mellitus without complication (HCC)   . GI bleed   . Pneumothorax    HOSPITAL COURSE:  Syncope -patient and his wife describe multiple episodes recently where he has fallen and had brief loss of consciousness or near loss of consciousness. Troponin was negative.Echocardiograph is unremarkable. Carotid ultrasound is unremarkable.Possible due to hypotension or orthostatic hypotension or PE. The patient needs Holter monitor as outpatient per Dr. Lady Gary.  Hypotensive episode -likely dehydration.Blood pressure was normal but low side. Hold nadolol. Resumed nadolol twice daily due to better blood pressure.  Lactic acidosis. Improved with IV fluid.  PAF (paroxysmal atrial fibrillation)with RVR Resume  nadolol. Continueaspirin anddigoxin. Per Dr. Lady Gary, given his chronic thrombocytopenia,the patient and his wife are preferring not to proceed with chronic anticoagulation. At present given no sustained atrial fibrillation during this visit would defer chronic anticoagulation for now.Follow-up Dr. Lady Gary as outpatient.  Diabetes (HCC)  -sliding scale insulin coverage,resume home medication after discharge.  Alcohol use - CIWA protocol,patient has mild fever but no anxiety or withdrawal symptoms.  CAD (coronary artery disease) -continue home meds.  COPD. Stable. Hypothyroidism -home dose thyroid replacement. Morbid obesity. Diet control and exercise was advised.  Acute respiratory failure with hypoxia due to PE and DVT.  He was on heparin drip.  Dr. Lady Gary suggested Eliquis. The patient had concerns about oral anticoagulation.  After discussion with his wife, he agreed to start Eliquis. Eliquis pharmacy to dose, discontinued heparin drip.  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 Clinically stable. Hypomagnesemia.  Given IV magnesium.  Follow-up level with PCP. Morbid obesity.  Diet control and exercise, follow-up PCP. Generalized weakness.  The patient need home health and PT. DISCHARGE CONDITIONS:  Stable, discharged to home home health and PT today. CONSULTS OBTAINED:  Treatment Team:  Dalia Heading, MD Annice Needy, MD DRUG ALLERGIES:  No Known Allergies DISCHARGE MEDICATIONS:   Allergies as of 09/03/2018   No Known Allergies     Medication List    STOP taking these medications   aspirin EC 81 MG tablet   BASAGLAR KWIKPEN 100 UNIT/ML Sopn   HUMALOG KWIKPEN 100 UNIT/ML KwikPen Generic drug:  insulin lispro   ST JOHNS WORT PO     TAKE these medications   albuterol 108 (90 Base) MCG/ACT inhaler Commonly known as:  PROVENTIL HFA;VENTOLIN HFA Inhale 2 puffs into the lungs every 6 (six) hours as needed for wheezing or shortness of breath.   apixaban 5 MG Tabs tablet Commonly known as:  ELIQUIS 10 mg po bid for 6 days, then 5 mg po bid.   digoxin 0.125 MG tablet Commonly known as:  LANOXIN Take 0.125 mg by mouth daily.   levothyroxine 100 MCG tablet Commonly known as:  SYNTHROID, LEVOTHROID Take 100 mcg by mouth daily before breakfast.   nadolol 40 MG tablet Commonly known  as:  CORGARD Take 80 mg by mouth 2 (two) times daily.   omega-3 fish oil 1000 MG Caps capsule Commonly known as:  MAXEPA Take 1 capsule by mouth 2 (two) times daily.   omeprazole 20 MG capsule Commonly known as:  PRILOSEC Take 20 mg by mouth daily.   QUEtiapine 100 MG tablet Commonly known as:  SEROQUEL Take 100-200 mg by mouth 2 (two) times daily. 100 mg in the morning and 200 mg at bedtime   traMADol 50 MG tablet Commonly known as:  ULTRAM Take 50 mg by mouth 2 (two) times daily.   TRULICITY 0.75 MG/0.5ML Sopn Generic drug:  Dulaglutide Inject 0.75 mg into the skin every Sunday.        DISCHARGE INSTRUCTIONS:  See AVS.  If you experience worsening of your admission symptoms, develop shortness of breath, life threatening emergency, suicidal or homicidal thoughts you must seek medical attention immediately by calling 911 or calling your MD immediately  if symptoms less severe.  You Must read complete instructions/literature along with all the possible adverse reactions/side effects for all the Medicines you take and that have been prescribed to you. Take any new Medicines after you have completely understood and accpet all the possible adverse reactions/side effects.   Please note  You were cared for by a hospitalist during your hospital stay. If you have any questions about your discharge medications or the care you received while you were in the hospital after you are discharged, you can call the unit and asked to speak with the hospitalist on call if the hospitalist that took care of you is not available. Once you are discharged, your primary care physician will handle any further medical issues. Please note that NO REFILLS for any discharge medications will be authorized once you are discharged, as it is imperative that you return to your primary care physician (or establish a relationship with a primary care physician if you do not have one) for your aftercare needs so that  they can reassess your need for medications and monitor your lab values.    On the day of Discharge:  VITAL SIGNS:  Blood pressure 115/88, pulse (!) 112, temperature 98.5 F (36.9 C), resp. rate 20, height 5\' 8"  (1.727 m), weight 117.8 kg, SpO2 95 %. PHYSICAL EXAMINATION:  GENERAL:  67 y.o.-year-old patient lying in the bed with no acute distress.  Morbid obesity. EYES: Pupils equal, round, reactive to light and accommodation. No scleral icterus. Extraocular muscles intact.  HEENT: Head atraumatic, normocephalic. Oropharynx and nasopharynx clear.  NECK:  Supple, no jugular venous distention. No thyroid enlargement, no tenderness.  LUNGS: Normal breath sounds bilaterally, no wheezing, rales,rhonchi or crepitation. No use of accessory muscles of respiration.  CARDIOVASCULAR: S1, S2 normal. No murmurs, rubs, or gallops.  ABDOMEN: Soft, non-tender, non-distended. Bowel sounds present. No organomegaly or mass.  EXTREMITIES: No pedal edema, cyanosis, or clubbing.  NEUROLOGIC: Cranial nerves II through XII are intact. Muscle strength 5/5 in all extremities. Sensation intact. Gait not checked.  PSYCHIATRIC: The patient is alert and oriented x 3.  SKIN: No obvious rash, lesion, or ulcer.  DATA REVIEW:   CBC Recent Labs  Lab 09/03/18 0353  WBC 4.4  HGB 13.0  HCT 39.7  PLT 75*    Chemistries  Recent Labs  Lab 08/29/18 2227  09/01/18 0634 09/03/18 0353  NA 133*   < > 137  --   K 4.3   < > 3.9  --   CL 102   < > 102  --   CO2 20*   < > 27  --   GLUCOSE 124*   < > 108*  --   BUN 13   < > 11  --   CREATININE 0.88   < > 0.61  --   CALCIUM 7.9*   < > 8.2*  --   MG  --   --   --  1.6*  AST 47*  --   --   --   ALT 18  --   --   --   ALKPHOS 107  --   --   --   BILITOT 1.5*  --   --   --    < > = values in this interval not displayed.     Microbiology Results  No results found for this or any previous visit.  RADIOLOGY:  No results found.   Management plans discussed with  the patient, family and they are in agreement.  CODE STATUS: Full Code   TOTAL TIME TAKING CARE OF THIS PATIENT: 33 minutes.    Shaune Pollack M.D on 09/03/2018 at 4:18 PM  Between 7am to 6pm - Pager - [phone]  After 6pm go to [email or web address] - Social research officer, government  [facility]  Office  [phone]  CC: Primary care physician; Danella Penton, MD   Note: This dictation was prepared with Dragon dictation along with smaller phrase technology. Any transcriptional errors that result from this process are unintentional.

## 2018-09-03 NOTE — Progress Notes (Signed)
Counseled patient on apixaban for PE. Discussed dosing 10 mg BID x 7 days, then transition to 5 mg BID thereafter. Discussed adverse effects, OTC interactions, consistency with adherence, indication, etc. Patient is an Nurse, mental healthnglish pharmacist, and is very knowledgable - eagerly accepted counseling.   Mauri ReadingSavanna M Martin, PharmD Pharmacy Resident  09/03/2018 10:01 AM

## 2018-09-03 NOTE — Care Management Important Message (Signed)
Copy of signed IM left with patient in room.  

## 2018-09-03 NOTE — Plan of Care (Signed)
  Problem: Safety: Goal: Ability to remain free from injury will improve Outcome: Progressing   Problem: Clinical Measurements: Goal: Ability to maintain clinical measurements within normal limits will improve Outcome: Progressing   Problem: Activity: Goal: Risk for activity intolerance will decrease Outcome: Progressing

## 2018-09-03 NOTE — Discharge Planning (Signed)
Patient IV and tele removed.  RN assessment and VS revealed stability for DC to home.  Discharge papers given, explained and educated.  Informed of suggested FU appts and appts made.  Given printed script for elequis.  Once ready, will be wheeled to front and family transporting home via car.

## 2018-09-04 LAB — CARDIOLIPIN ANTIBODIES, IGG, IGM, IGA
Anticardiolipin IgA: 24 APL U/mL — ABNORMAL HIGH (ref 0–11)
Anticardiolipin IgG: <9 GPL U/mL (ref 0–14)
Anticardiolipin IgM: 9 MPL U/mL (ref 0–12)

## 2018-09-05 LAB — LUPUS ANTICOAGULANT PANEL
DRVVT: 44.6 s (ref 0.0–47.0)
PTT Lupus Anticoagulant: 47.3 s (ref 0.0–51.9)

## 2018-09-08 LAB — FACTOR 5 LEIDEN

## 2018-09-08 LAB — PROTHROMBIN GENE MUTATION

## 2018-09-11 DIAGNOSIS — F1010 Alcohol abuse, uncomplicated: Secondary | ICD-10-CM | POA: Insufficient documentation

## 2018-09-12 ENCOUNTER — Telehealth: Payer: Self-pay | Admitting: *Deleted

## 2018-09-12 ENCOUNTER — Telehealth: Payer: Self-pay

## 2018-09-12 NOTE — Telephone Encounter (Signed)
Needs follow up appointment Per Synetta FailAnita 09/12/18 staff message  Appt was scheduled as requested. I called patient and made him aware of his appt Date, Time and Location A reminder letter was mailed out as well.

## 2018-09-12 NOTE — Telephone Encounter (Signed)
Flagged on EMMI report for not receiving discharge papers, being unsure who to call regarding changes in condition, and having other questions or problems.  First attempt to reach patient made, however unable to reach patient.  Left voicemail encouraging callback. Will attempt at later time.

## 2018-09-22 ENCOUNTER — Inpatient Hospital Stay: Payer: PRIVATE HEALTH INSURANCE | Attending: Hematology and Oncology | Admitting: Hematology and Oncology

## 2018-09-22 NOTE — Progress Notes (Deleted)
[facility]  Clinic day:  09/22/2018  Chief Complaint: [name] is a 68 y.o. male with bilateral pulmonary emboli and a right lower extremity DVT who is seen for assessment after recent hospitalization.  HPI:  The patient has a long standing history of thrombocytopenia.  He states the a decision was made 10 years ago while living in [place] not to anticoagulate him for paroxsymal atrial fibrillation secondary to thrombocytopenia.  He is unaware of the cause of his thrombocytopenia.  He describes a history of lupus 10 1/2 years ago.  He had proteinuria.  He saw a rheumatologist.  He was on steroids briefly.  He did not follow-up.  He has cirrhosis of the liver and splenomegaly.  Approximately 10 years ago while living in [place], he had a major upper GI bleed secondary to esophageal varcices.  He was placed on omeprazole.  He is unaware of any banding or cautery.  Colonoscopy 5 years ago in [place] was "ok".  He denies any current melena or hematochezia.  Prior to recent events, he denies any history of thrombosis or family history of thrombosis or malignancy.  Diet is good.  He denies any new medications except for Trulicity.  He denies any herbal productts except St John's wort.  He notes that his first fall was 6 weeks ago.  He denies any falls related to alcohol use.  Approximately 4 weeks ago he was started on Trulicity.  He notes symptoms of nausea and by report from his wife, multiple falls/syncope (he is unaware).  He denied any tachycardia, but notes that in the past when his hear rate was extremely high, he was unaware.  He denied any chest pain.  For about 1 month he has been extremely short of breath with minimal walking.  He denies any pleuritic chest pain or hemoptysis.  Review of prior CBCs dating back to 07/16/2016 reveal a platelet count ranging from 74,000 - 80,000. Creatinine has ranged from 0.6-0.8.  B12 was 467 on 01/21/2018.   TSH was 2.90 on 07/23/2018.  PSA was 0.85 on 07/23/2018.  He was admitted to [facility] from 08/30/2018 - 12/x/2019 with multiple episodes of syncope.  Head CT on 08/29/2018 revealed no acute findings.    Abdomen and pelvic CT on 08/29/2018 revealed hepatic cirrhosis with portal venous hypertension, recanalized umbilical vein, and splenorenal shunting.  There was mild indistinctness of the pancreatic margin, probably incidental, less likely to be due to pancreatitis. There was a small left pleural effusion with adjacent atelectasis.  There was rright heart enlargement.  There was splenomegaly (15.8 x 8.3 x 17.4 cm; volume 1200 cm3).  Bilateral carotid ultrasound on 08/30/2018 revealed mild stenosis in the proximal right internal carotid and proximal left internal carotid artery.  Chest CT on 08/31/2018 revealed large volume of pulmonary emboli in the lungs bilaterally, with dilatation of the right atrium and right ventricle (RV to LV ratio of 1.19) indicative of elevated right-sided heart pressures and right heart strain. He has a left pleural effusion and left lower lobe atelectasis. He has findings consistent with hepatic cirrhosis and portal hypertension.  Bilateral lower extremity ultrasound on 08/31/2018 revealed occlusive thrombus from the mid right femoral vein through the popliteal vein into the posterior tibial vein.  He was placed on a heparin drip.  He was seen by Dr. Lady Gary.  Given his thrombocytopenia, the patient and his wife were preferring not to proceed with chronic anticoagulation. Prior to discharge, he  agreed to start Eliquis.  He was given an Rx for 10 mg BID x 6 days then 5 mg BID.  Labs during his admission included:  CBC on 08/29/2018 revealed a hematocrit of 39.7, hemoglobin 13.2, MCV 95.7, platelets 97,000.  CBC on 09/01/2018 revealed a hematocrit of 37.9, hemoglobin 12.3, platelets 61,000, and WBC 3200.  Ethanol level was 163 (<10) on 08/29/2018.  D-dimers were 7,359.94 on  08/31/2018.  PT was 15.4 (INR 1.23) and PTT 31 on 08/31/2018.  Facor V Leiden, prothrombin gene mutation, lupus anticoagulant panel, anticardiolipin antibodies (IgA 24), and beta-2 glycoprotein were negative.  Hepatitis B core antibody, hepatitis B surface antigen, and hepatitis C antibody were negative.  H pylori IgA was 22.2 (0-8.9). H pylori IgM and IgG were negative.  B12 was 510.  Folate was 21.6.  Ferritin was 40.  Symptomatically,   Past Medical History:  Diagnosis Date  . COPD (chronic obstructive pulmonary disease) (HCC)   . Diabetes mellitus without complication (HCC)   . GI bleed   . Pneumothorax     Past Surgical History:  Procedure Laterality Date  . APPENDECTOMY    . HERNIA REPAIR      No family history on file.  Social History:  reports that he has never smoked. He has never used smokeless tobacco. He reports current alcohol use. No history on file for drug.   The patient smoked 1.5 ppd x 35 years (age [age]) for 55 pack year smoking history.  He stopped smoking 10 years ago.  He has a history of heavy drinking.  He currently drinks 2 bottles of beer a day.  He denies any history of alcohol withdrawal.  He denies any exposure to radiation or toxins.  He is from [place].  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  He is retired.  He came [place] in [place].  He became a [place] citizen approximately 10 years ago.  The patient is accompanied by *** alone today.  Allergies: No Known Allergies  Current Medications: Current Outpatient Medications  Medication Sig Dispense Refill  . albuterol (PROVENTIL HFA;VENTOLIN HFA) 108 (90 Base) MCG/ACT inhaler Inhale 2 puffs into the lungs every 6 (six) hours as needed for wheezing or shortness of breath.    Marland Kitchen. apixaban (ELIQUIS) 5 MG TABS tablet 10 mg po bid for 6 days, then 5 mg po bid. 60 tablet 1  . digoxin (LANOXIN) 0.125 MG tablet Take 0.125 mg by mouth daily.    .  Dulaglutide (TRULICITY) 0.75 MG/0.5ML SOPN Inject 0.75 mg into the skin every Sunday.    . levothyroxine (SYNTHROID, LEVOTHROID) 100 MCG tablet Take 100 mcg by mouth daily before breakfast.    . nadolol (CORGARD) 40 MG tablet Take 80 mg by mouth 2 (two) times daily.    Marland Kitchen. omega-3 fish oil (MAXEPA) 1000 MG CAPS capsule Take 1 capsule by mouth 2 (two) times daily.    Marland Kitchen. omeprazole (PRILOSEC) 20 MG capsule Take 20 mg by mouth daily.    . QUEtiapine (SEROQUEL) 100 MG tablet Take 100-200 mg by mouth 2 (two) times daily. 100 mg in the morning and 200 mg at bedtime    . traMADol (ULTRAM) 50 MG tablet Take 50 mg by mouth 2 (two) times daily.     No current facility-administered medications for this visit.     Review of Systems:  GENERAL:  Feels good.  Active.  No fevers,  sweats or weight loss. PERFORMANCE STATUS (ECOG):  *** HEENT:  No visual changes, runny nose, sore throat, mouth sores or tenderness. Lungs: No shortness of breath or cough.  No hemoptysis. Cardiac:  No chest pain, palpitations, orthopnea, or PND. GI:  No nausea, vomiting, diarrhea, constipation, melena or hematochezia. GU:  No urgency, frequency, dysuria, or hematuria. Musculoskeletal:  No back pain.  No joint pain.  No muscle tenderness. Extremities:  No pain or swelling. Skin:  No rashes or skin changes. Neuro:  No headache, numbness or weakness, balance or coordination issues. Endocrine:  No diabetes, thyroid issues, hot flashes or night sweats. Psych:  No mood changes, depression or anxiety. Pain:  No focal pain. Review of systems:  All other systems reviewed and found to be negative.  GENERAL:  No fevers, sweats or weight loss. PERFORMANCE STATUS (ECOG):  1 HEENT:  No visual changes, runny nose, sore throat, mouth sores or tenderness. Lungs: Shortness of breath x 1 month.  No pleuritic chest pain.  No cough.  No hemoptysis. Cardiac:  H/o paroxysmal atrial fibrillation.  No chest pain or palpitations. GI:  Nausea  associated with Trulicity.  No vomiting, diarrhea, constipation, melena or hematochezia. GU:  No urgency, frequency, dysuria, or hematuria. Musculoskeletal:  No back pain.  No joint pain.  No muscle tenderness. Extremities:  Chronic lower extremity swelling.  No pain. Skin:  No rashes or skin changes. Neuro:  No headache, numbness or weakness, balance or coordination issues. Endocrine:  Diabetes.  Thyroid disease.  No hot flashes or night sweats. Psych:  No mood changes, depression or anxiety. Pain:  No focal pain. Review of systems:  All other systems reviewed and found to be negative.  Physical Exam: There were no vitals taken for this visit. GENERAL:  Well developed, well nourished, **man sitting comfortably in the exam room in no acute distress. MENTAL STATUS:  Alert and oriented to person, place and time. HEAD:  *** hair.  Normocephalic, atraumatic, face symmetric, no Cushingoid features. EYES:  *** eyes.  Pupils equal round and reactive to light and accomodation.  No conjunctivitis or scleral icterus. ENT:  Oropharynx clear without lesion.  Tongue normal. Mucous membranes moist.  RESPIRATORY:  Clear to auscultation without rales, wheezes or rhonchi. CARDIOVASCULAR:  Regular rate and rhythm without murmur, rub or gallop. ABDOMEN:  Soft, non-tender, with active bowel sounds, and no hepatosplenomegaly.  No masses. SKIN:  No rashes, ulcers or lesions. EXTREMITIES: No edema, no skin discoloration or tenderness.  No palpable cords. LYMPH NODES: No palpable cervical, supraclavicular, axillary or inguinal adenopathy  NEUROLOGICAL: Unremarkable. PSYCH:  Appropriate.  GENERAL:  Well developed, well nourished, gentleman sitting comfortably on the medical unit in a recliner in no acute distress. MENTAL STATUS:  Alert and oriented to person, place and time. HEAD:  Brown hair with beard.  Male pattern baldness.  Normocephalic, atraumatic, face symmetric, no Cushingoid features. EYES:  Glasses.   Blue eyes.  Pupils equal round and reactive to light and accomodation.  No conjunctivitis or scleral icterus. ENT:  Oropharynx clear without lesion.  Tongue normal.  Poor dentition.  Mucous membranes moist.  RESPIRATORY:  Clear to auscultation without rales, wheezes or rhonchi. CARDIOVASCULAR:  Regular rate and rhythm without murmur, rub or gallop. ABDOMEN:  Protuberant.  Soft, non-tender, with active bowel sounds.  Hepatosplenomegaly difficult to assess.  Spleen palpable.  No masses. SKIN:  No rashes, ulcers or lesions. EXTREMITIES: Chronic 2+ bilateral lower extremity edema.  No skin discoloration or tenderness.  No  palpable cords. LYMPH NODES: No palpable cervical, supraclavicular, axillary or inguinal adenopathy  NEUROLOGICAL: Unremarkable. PSYCH:  Appropriate.   No visits with results within 3 Day(s) from this visit.  Latest known visit with results is:  No results displayed because visit has over 200 results.      Assessment:  Lawrence Osborn is a 68 y.o. male with bilateral pulmonary emboli and a right lower extremity DVT presenting with shortness of breath and recurrent syncope x 1 month.  Abdomen and pelvic CT on 08/29/2018 revealed hepatic cirrhosis with portal venous hypertension, recanalized umbilical vein, and splenorenal shunting.  There was right heart enlargement.  There was splenomegaly (15.8 x 8.3 x 17.4 cm; volume 1200 cm3).  Chest CT on 08/31/2018 revealed large volume of pulmonary emboli in the lungs bilaterally, with dilatation of the right atrium and right ventricle (RV to LV ratio of 1.19) indicative of elevated right-sided heart pressures and right heart strain.  Bilateral lower extremity ultrasound on 08/31/2018 revealed occlusive thrombus from the mid right femoral vein through the popliteal vein into the posterior tibial vein.  He has a long standing history of thrombocytopenia likely secondary to cirrhosis and massive splenomegaly.  He drinks alcohol daily.   He has a history of lupus treated with steroids.  Symptomatically, he notes shortness of breath with minimal exertion.  Exam reveals a protuberant abdomen, splenomegaly, and bilateral lower extremity edema.  Plan:   1. Labs today:  CBC with diff, CMP, ferritin, iron studies, sed rate, ANA.    Bilateral pulmonary embolism and Right lower extremity DVT:             Patient has significant clot burden in his lungs.             Patient currently on heparin.             Discussed risk versus benefit of anticoagulation versus filter given chronic thrombocytopenia.             Patients typically can be anticoagulated with platelet counts > 50,000.             Concern for history of falls/syncope (patient denies related to alcohol) likely related to pulmonary emboli.             Concern for history of major GI bleed (varices) 10 years ago while living in [place].             Labs: Factor V Leiden, prothrombin gene mutation, lupus anticoagulant work-up. 2.  Thrombocytopenia:             Longstanding (at least 10 years).  Previously prevented anticoagulation for paroxysmal atrial fibrillation in [place].             Etiology likely secondary to cirrhosis and massive splenomegaly.             Diet appears good.  No new medications except Trulicity.  No herbal products except St John's wart.             Labs:  B12, folate, ANA, hepatitis B and C serologies, H pylori serologies. 3.  Patient is currently deciding whether he wishes to pursue anticoagulation.   4.  Patient being evaluated for a possible IVC filter.  Dr. Lady GaryFath would like to pursue Eliquis.   Rosey BathMelissa C Corcoran, MD  09/22/2018, 4:05 AM   I saw and evaluated the patient, participating in the key portions of the service and reviewing pertinent diagnostic studies and records.  I reviewed  the nurse practitioner's note and agree with the findings and the plan.  The assessment and plan were discussed with the patient.  Additional diagnostic  studies of *** are needed to clarify *** and would change the clinical management.  A few ***multiple questions were asked by the patient and answered.   Nelva Nay, MD 09/22/2018,4:05 AM

## 2018-09-24 ENCOUNTER — Telehealth: Payer: Self-pay | Admitting: Hematology and Oncology

## 2018-09-24 NOTE — Telephone Encounter (Signed)
Talked to Usman's wife and asked her to reschedule [facility] follow up. She said his PCP asked him not to see any other Drs. "He didn't want too many cooks in the kitchen." He will take care of Renso himself according to wife

## 2018-12-03 ENCOUNTER — Other Ambulatory Visit (HOSPITAL_COMMUNITY): Payer: Self-pay | Admitting: Internal Medicine

## 2018-12-03 ENCOUNTER — Other Ambulatory Visit: Payer: Self-pay | Admitting: Internal Medicine

## 2018-12-03 ENCOUNTER — Ambulatory Visit
Admission: RE | Admit: 2018-12-03 | Discharge: 2018-12-03 | Disposition: A | Discharge status: Home / Self Care | Payer: PRIVATE HEALTH INSURANCE | Source: Ambulatory Visit | Attending: Internal Medicine | Admitting: Internal Medicine

## 2018-12-03 DIAGNOSIS — R1011 Right upper quadrant pain: Secondary | ICD-10-CM | POA: Insufficient documentation

## 2019-07-30 ENCOUNTER — Other Ambulatory Visit: Payer: Self-pay

## 2019-07-30 ENCOUNTER — Inpatient Hospital Stay: Payer: Medicare Other

## 2019-07-30 ENCOUNTER — Inpatient Hospital Stay
Admission: EM | Admit: 2019-07-30 | Discharge: 2019-08-03 | DRG: 872 | Disposition: A | Discharge status: Home / Self Care | Payer: Medicare Other | Source: Ambulatory Visit | Attending: Internal Medicine | Admitting: Internal Medicine

## 2019-07-30 ENCOUNTER — Encounter: Payer: Self-pay | Admitting: Emergency Medicine

## 2019-07-30 DIAGNOSIS — F1010 Alcohol abuse, uncomplicated: Secondary | ICD-10-CM | POA: Diagnosis present

## 2019-07-30 DIAGNOSIS — Z7901 Long term (current) use of anticoagulants: Secondary | ICD-10-CM | POA: Diagnosis not present

## 2019-07-30 DIAGNOSIS — R140 Abdominal distension (gaseous): Secondary | ICD-10-CM

## 2019-07-30 DIAGNOSIS — Z86718 Personal history of other venous thrombosis and embolism: Secondary | ICD-10-CM

## 2019-07-30 DIAGNOSIS — L089 Local infection of the skin and subcutaneous tissue, unspecified: Secondary | ICD-10-CM | POA: Diagnosis not present

## 2019-07-30 DIAGNOSIS — Z8249 Family history of ischemic heart disease and other diseases of the circulatory system: Secondary | ICD-10-CM

## 2019-07-30 DIAGNOSIS — Z794 Long term (current) use of insulin: Secondary | ICD-10-CM | POA: Diagnosis not present

## 2019-07-30 DIAGNOSIS — E1142 Type 2 diabetes mellitus with diabetic polyneuropathy: Secondary | ICD-10-CM

## 2019-07-30 DIAGNOSIS — E1165 Type 2 diabetes mellitus with hyperglycemia: Secondary | ICD-10-CM | POA: Diagnosis present

## 2019-07-30 DIAGNOSIS — I482 Chronic atrial fibrillation, unspecified: Secondary | ICD-10-CM | POA: Diagnosis present

## 2019-07-30 DIAGNOSIS — D6959 Other secondary thrombocytopenia: Secondary | ICD-10-CM | POA: Diagnosis present

## 2019-07-30 DIAGNOSIS — E872 Acidosis: Secondary | ICD-10-CM | POA: Diagnosis present

## 2019-07-30 DIAGNOSIS — K7030 Alcoholic cirrhosis of liver without ascites: Secondary | ICD-10-CM

## 2019-07-30 DIAGNOSIS — Z20828 Contact with and (suspected) exposure to other viral communicable diseases: Secondary | ICD-10-CM | POA: Diagnosis present

## 2019-07-30 DIAGNOSIS — R7989 Other specified abnormal findings of blood chemistry: Secondary | ICD-10-CM | POA: Diagnosis not present

## 2019-07-30 DIAGNOSIS — A419 Sepsis, unspecified organism: Principal | ICD-10-CM | POA: Diagnosis present

## 2019-07-30 DIAGNOSIS — L03039 Cellulitis of unspecified toe: Secondary | ICD-10-CM

## 2019-07-30 DIAGNOSIS — K7031 Alcoholic cirrhosis of liver with ascites: Secondary | ICD-10-CM | POA: Diagnosis present

## 2019-07-30 DIAGNOSIS — L03032 Cellulitis of left toe: Secondary | ICD-10-CM | POA: Diagnosis present

## 2019-07-30 DIAGNOSIS — Z79899 Other long term (current) drug therapy: Secondary | ICD-10-CM | POA: Diagnosis not present

## 2019-07-30 DIAGNOSIS — I959 Hypotension, unspecified: Secondary | ICD-10-CM

## 2019-07-30 DIAGNOSIS — L03116 Cellulitis of left lower limb: Secondary | ICD-10-CM | POA: Diagnosis present

## 2019-07-30 DIAGNOSIS — Z86711 Personal history of pulmonary embolism: Secondary | ICD-10-CM | POA: Diagnosis not present

## 2019-07-30 DIAGNOSIS — Z79890 Hormone replacement therapy (postmenopausal): Secondary | ICD-10-CM | POA: Diagnosis not present

## 2019-07-30 DIAGNOSIS — Z6841 Body Mass Index (BMI) 40.0 and over, adult: Secondary | ICD-10-CM | POA: Diagnosis not present

## 2019-07-30 DIAGNOSIS — E1140 Type 2 diabetes mellitus with diabetic neuropathy, unspecified: Secondary | ICD-10-CM | POA: Diagnosis present

## 2019-07-30 LAB — CBC
HCT: 39.4 % (ref 39.0–52.0)
Hemoglobin: 12.6 g/dL — ABNORMAL LOW (ref 13.0–17.0)
MCH: 27.9 pg (ref 26.0–34.0)
MCHC: 32 g/dL (ref 30.0–36.0)
MCV: 87.2 fL (ref 80.0–100.0)
Platelets: 80 10*3/uL — ABNORMAL LOW (ref 150–400)
RBC: 4.52 MIL/uL (ref 4.22–5.81)
RDW: 16.9 % — ABNORMAL HIGH (ref 11.5–15.5)
WBC: 5.7 10*3/uL (ref 4.0–10.5)
nRBC: 0 % (ref 0.0–0.2)

## 2019-07-30 LAB — COMPREHENSIVE METABOLIC PANEL
ALT: 25 U/L (ref 0–44)
ALT: 21 U/L (ref 0–44)
AST: 48 U/L — ABNORMAL HIGH (ref 15–41)
AST: 43 U/L — ABNORMAL HIGH (ref 15–41)
Albumin: 3.2 g/dL — ABNORMAL LOW (ref 3.5–5.0)
Albumin: 2.8 g/dL — ABNORMAL LOW (ref 3.5–5.0)
Alkaline Phosphatase: 126 U/L (ref 38–126)
Alkaline Phosphatase: 113 U/L (ref 38–126)
Anion gap: 12 (ref 5–15)
Anion gap: 10 (ref 5–15)
BUN: 18 mg/dL (ref 8–23)
BUN: 19 mg/dL (ref 8–23)
CO2: 26 mmol/L (ref 22–32)
CO2: 27 mmol/L (ref 22–32)
Calcium: 9.2 mg/dL (ref 8.9–10.3)
Calcium: 8.4 mg/dL — ABNORMAL LOW (ref 8.9–10.3)
Chloride: 96 mmol/L — ABNORMAL LOW (ref 98–111)
Chloride: 97 mmol/L — ABNORMAL LOW (ref 98–111)
Creatinine, Ser: 1.06 mg/dL (ref 0.61–1.24)
Creatinine, Ser: 0.93 mg/dL (ref 0.61–1.24)
GFR calc Af Amer: >60 mL/min (ref >60)
GFR calc Af Amer: >60 mL/min (ref >60)
GFR calc non Af Amer: >60 mL/min (ref >60)
GFR calc non Af Amer: >60 mL/min (ref >60)
Glucose, Bld: 253 mg/dL — ABNORMAL HIGH (ref 70–99)
Glucose, Bld: 246 mg/dL — ABNORMAL HIGH (ref 70–99)
Potassium: 4.1 mmol/L (ref 3.5–5.1)
Potassium: 3.8 mmol/L (ref 3.5–5.1)
Sodium: 134 mmol/L — ABNORMAL LOW (ref 135–145)
Sodium: 134 mmol/L — ABNORMAL LOW (ref 135–145)
Total Bilirubin: 1.7 mg/dL — ABNORMAL HIGH (ref 0.3–1.2)
Total Bilirubin: 1.5 mg/dL — ABNORMAL HIGH (ref 0.3–1.2)
Total Protein: 7.4 g/dL (ref 6.5–8.1)
Total Protein: 6.6 g/dL (ref 6.5–8.1)

## 2019-07-30 LAB — CBC WITH DIFFERENTIAL/PLATELET
Abs Immature Granulocytes: 0.06 10*3/uL (ref 0.00–0.07)
Basophils Absolute: 0.1 10*3/uL (ref 0.0–0.1)
Basophils Relative: 1 %
Eosinophils Absolute: 0.2 10*3/uL (ref 0.0–0.5)
Eosinophils Relative: 3 %
HCT: 42.5 % (ref 39.0–52.0)
Hemoglobin: 13.8 g/dL (ref 13.0–17.0)
Immature Granulocytes: 1 %
Lymphocytes Relative: 9 %
Lymphs Abs: 0.7 10*3/uL (ref 0.7–4.0)
MCH: 27.5 pg (ref 26.0–34.0)
MCHC: 32.5 g/dL (ref 30.0–36.0)
MCV: 84.8 fL (ref 80.0–100.0)
Monocytes Absolute: 1 10*3/uL (ref 0.1–1.0)
Monocytes Relative: 13 %
Neutro Abs: 5.9 10*3/uL (ref 1.7–7.7)
Neutrophils Relative %: 73 %
Platelets: 98 10*3/uL — ABNORMAL LOW (ref 150–400)
RBC: 5.01 MIL/uL (ref 4.22–5.81)
RDW: 17.1 % — ABNORMAL HIGH (ref 11.5–15.5)
Smear Review: NORMAL
WBC: 7.9 10*3/uL (ref 4.0–10.5)
nRBC: 0 % (ref 0.0–0.2)

## 2019-07-30 LAB — GLUCOSE, CAPILLARY
Glucose-Capillary: 231 mg/dL — ABNORMAL HIGH (ref 70–99)
Glucose-Capillary: 179 mg/dL — ABNORMAL HIGH (ref 70–99)

## 2019-07-30 LAB — HEMOGLOBIN A1C
Hgb A1c MFr Bld: 9.1 % — ABNORMAL HIGH (ref 4.8–5.6)
Mean Plasma Glucose: 214.47 mg/dL

## 2019-07-30 LAB — PROTIME-INR
INR: 1.6 — ABNORMAL HIGH (ref 0.8–1.2)
Prothrombin Time: 19.2 seconds — ABNORMAL HIGH (ref 11.4–15.2)

## 2019-07-30 LAB — MAGNESIUM: Magnesium: 2 mg/dL (ref 1.7–2.4)

## 2019-07-30 LAB — LACTIC ACID, PLASMA
Lactic Acid, Venous: 2.3 mmol/L (ref 0.5–1.9)
Lactic Acid, Venous: 1.6 mmol/L (ref 0.5–1.9)

## 2019-07-30 LAB — PHOSPHORUS: Phosphorus: 3.4 mg/dL (ref 2.5–4.6)

## 2019-07-30 MED ORDER — TRAMADOL HCL 50 MG PO TABS
50.0000 mg | ORAL_TABLET | Freq: Two times a day (BID) | ORAL | Status: DC
  Administered 2019-08-03: 50 mg via ORAL
  Filled 2019-07-30 (×5): qty 1

## 2019-07-30 MED ORDER — DIGOXIN 125 MCG PO TABS: 0.1250 mg | ORAL_TABLET | Freq: Every day | ORAL | Status: DC

## 2019-07-30 MED ORDER — FOLIC ACID 1 MG PO TABS
1.0000 mg | ORAL_TABLET | Freq: Every day | ORAL | Status: DC
  Administered 2019-07-30 – 2019-08-03 (×5): 1 mg via ORAL
  Filled 2019-07-30 (×6): qty 1

## 2019-07-30 MED ORDER — SODIUM CHLORIDE 0.9 % IV SOLN
3.0000 g | Freq: Once | INTRAVENOUS | Status: AC
  Administered 2019-07-30: 14:00:00 3 g via INTRAVENOUS
  Filled 2019-07-30: qty 8

## 2019-07-30 MED ORDER — VITAMIN B-1 100 MG PO TABS
100.0000 mg | ORAL_TABLET | Freq: Every day | ORAL | Status: DC
  Administered 2019-07-30 – 2019-08-03 (×5): 100 mg via ORAL
  Filled 2019-07-30 (×6): qty 1

## 2019-07-30 MED ORDER — ALBUTEROL SULFATE HFA 108 (90 BASE) MCG/ACT IN AERS
2.0000 | INHALATION_SPRAY | Freq: Four times a day (QID) | RESPIRATORY_TRACT | Status: DC | PRN
  Filled 2019-07-30: qty 6.7

## 2019-07-30 MED ORDER — LORAZEPAM 2 MG/ML IJ SOLN: 1.0000 mg | INTRAMUSCULAR | Status: AC | PRN

## 2019-07-30 MED ORDER — INSULIN ASPART 100 UNIT/ML ~~LOC~~ SOLN: 0.0000 [IU] | Freq: Every day | SUBCUTANEOUS | Status: DC

## 2019-07-30 MED ORDER — LORAZEPAM 1 MG PO TABS: 1.0000 mg | ORAL_TABLET | ORAL | Status: AC | PRN

## 2019-07-30 MED ORDER — ACETAMINOPHEN 650 MG RE SUPP: 650.0000 mg | Freq: Four times a day (QID) | RECTAL | Status: DC | PRN

## 2019-07-30 MED ORDER — ONDANSETRON HCL 4 MG PO TABS
4.0000 mg | ORAL_TABLET | Freq: Four times a day (QID) | ORAL | Status: DC | PRN
  Filled 2019-07-30: qty 1

## 2019-07-30 MED ORDER — QUETIAPINE FUMARATE 25 MG PO TABS
100.0000 mg | ORAL_TABLET | Freq: Every morning | ORAL | Status: DC
  Administered 2019-07-31 – 2019-08-03 (×4): 100 mg via ORAL
  Filled 2019-07-30 (×3): qty 4
  Filled 2019-07-30: qty 1
  Filled 2019-07-30: qty 4

## 2019-07-30 MED ORDER — THIAMINE HCL 100 MG/ML IJ SOLN
100.0000 mg | Freq: Every day | INTRAMUSCULAR | Status: DC
  Filled 2019-07-30: qty 1

## 2019-07-30 MED ORDER — INSULIN ASPART 100 UNIT/ML ~~LOC~~ SOLN
0.0000 [IU] | Freq: Three times a day (TID) | SUBCUTANEOUS | Status: DC
  Administered 2019-07-30: 18:00:00 3 [IU] via SUBCUTANEOUS
  Administered 2019-07-31 (×2): 2 [IU] via SUBCUTANEOUS
  Administered 2019-07-31 – 2019-08-01 (×3): 1 [IU] via SUBCUTANEOUS
  Administered 2019-08-02 (×2): 2 [IU] via SUBCUTANEOUS
  Filled 2019-07-30 (×8): qty 1

## 2019-07-30 MED ORDER — ACETAMINOPHEN 325 MG PO TABS: 650.0000 mg | ORAL_TABLET | Freq: Four times a day (QID) | ORAL | Status: DC | PRN

## 2019-07-30 MED ORDER — SODIUM CHLORIDE 0.9 % IV SOLN
3.0000 g | Freq: Four times a day (QID) | INTRAVENOUS | Status: DC
  Administered 2019-07-30 – 2019-07-31 (×2): 3 g via INTRAVENOUS
  Filled 2019-07-30 (×3): qty 8
  Filled 2019-07-30: qty 3
  Filled 2019-07-30 (×3): qty 8

## 2019-07-30 MED ORDER — VANCOMYCIN HCL IN DEXTROSE 1-5 GM/200ML-% IV SOLN
1000.0000 mg | Freq: Once | INTRAVENOUS | Status: AC
  Administered 2019-07-30: 15:00:00 1000 mg via INTRAVENOUS
  Filled 2019-07-30: qty 200

## 2019-07-30 MED ORDER — ADULT MULTIVITAMIN W/MINERALS CH
1.0000 | ORAL_TABLET | Freq: Every day | ORAL | Status: DC
  Administered 2019-07-30 – 2019-08-03 (×5): 1 via ORAL
  Filled 2019-07-30 (×6): qty 1

## 2019-07-30 MED ORDER — VANCOMYCIN HCL IN DEXTROSE 1-5 GM/200ML-% IV SOLN
1000.0000 mg | Freq: Once | INTRAVENOUS | Status: AC
  Administered 2019-07-30: 18:00:00 1000 mg via INTRAVENOUS
  Filled 2019-07-30: qty 200

## 2019-07-30 MED ORDER — VANCOMYCIN HCL 10 G IV SOLR
1500.0000 mg | INTRAVENOUS | Status: DC
  Filled 2019-07-30: qty 1500

## 2019-07-30 MED ORDER — SODIUM CHLORIDE 0.9 % IV SOLN
INTRAVENOUS | Status: DC
  Administered 2019-07-30: 18:00:00 via INTRAVENOUS

## 2019-07-30 MED ORDER — VANCOMYCIN HCL 1.5 G IV SOLR
1500.0000 mg | INTRAVENOUS | Status: DC
  Filled 2019-07-30: qty 1500

## 2019-07-30 MED ORDER — INSULIN GLARGINE 100 UNIT/ML ~~LOC~~ SOLN
25.0000 [IU] | Freq: Two times a day (BID) | SUBCUTANEOUS | Status: DC
  Administered 2019-07-31 – 2019-08-03 (×7): 25 [IU] via SUBCUTANEOUS
  Filled 2019-07-30 (×10): qty 0.25

## 2019-07-30 MED ORDER — APIXABAN 5 MG PO TABS
5.0000 mg | ORAL_TABLET | Freq: Two times a day (BID) | ORAL | Status: DC
  Administered 2019-07-30 – 2019-08-03 (×8): 5 mg via ORAL
  Filled 2019-07-30 (×9): qty 1

## 2019-07-30 MED ORDER — INSULIN ASPART 100 UNIT/ML ~~LOC~~ SOLN
4.0000 [IU] | Freq: Three times a day (TID) | SUBCUTANEOUS | Status: DC
  Administered 2019-07-30 – 2019-08-03 (×10): 4 [IU] via SUBCUTANEOUS
  Filled 2019-07-30 (×10): qty 1

## 2019-07-30 MED ORDER — DILTIAZEM HCL ER COATED BEADS 180 MG PO CP24
180.0000 mg | ORAL_CAPSULE | Freq: Two times a day (BID) | ORAL | Status: DC
  Administered 2019-07-30 – 2019-07-31 (×2): 180 mg via ORAL
  Filled 2019-07-30 (×5): qty 1

## 2019-07-30 MED ORDER — OMEGA-3-ACID ETHYL ESTERS 1 G PO CAPS
1.0000 | ORAL_CAPSULE | Freq: Two times a day (BID) | ORAL | Status: DC
  Administered 2019-07-30 – 2019-08-03 (×8): 1 g via ORAL
  Filled 2019-07-30 (×10): qty 1

## 2019-07-30 MED ORDER — QUETIAPINE FUMARATE 25 MG PO TABS
200.0000 mg | ORAL_TABLET | Freq: Every day | ORAL | Status: DC
  Administered 2019-07-30 – 2019-08-02 (×4): 200 mg via ORAL
  Filled 2019-07-30: qty 8
  Filled 2019-07-30: qty 1
  Filled 2019-07-30 (×2): qty 8

## 2019-07-30 MED ORDER — LEVOTHYROXINE SODIUM 100 MCG PO TABS
100.0000 ug | ORAL_TABLET | Freq: Every day | ORAL | Status: DC
  Administered 2019-07-31 – 2019-08-03 (×4): 100 ug via ORAL
  Filled 2019-07-30 (×5): qty 1

## 2019-07-30 MED ORDER — SODIUM CHLORIDE 0.9 % IV BOLUS
1000.0000 mL | Freq: Once | INTRAVENOUS | Status: AC
  Administered 2019-07-30: 14:00:00 1000 mL via INTRAVENOUS

## 2019-07-30 MED ORDER — QUETIAPINE FUMARATE 25 MG PO TABS: 100.0000 mg | ORAL_TABLET | Freq: Two times a day (BID) | ORAL | Status: DC

## 2019-07-30 MED ORDER — NADOLOL 40 MG PO TABS
80.0000 mg | ORAL_TABLET | Freq: Two times a day (BID) | ORAL | Status: DC
  Administered 2019-07-30 – 2019-07-31 (×2): 80 mg via ORAL
  Filled 2019-07-30 (×4): qty 2

## 2019-07-30 MED ORDER — ONDANSETRON HCL 4 MG/2ML IJ SOLN: 4.0000 mg | Freq: Four times a day (QID) | INTRAMUSCULAR | Status: DC | PRN

## 2019-07-30 MED ORDER — PANTOPRAZOLE SODIUM 40 MG PO TBEC
40.0000 mg | DELAYED_RELEASE_TABLET | Freq: Every day | ORAL | Status: DC
  Administered 2019-07-31 – 2019-08-03 (×4): 40 mg via ORAL
  Filled 2019-07-30 (×4): qty 1

## 2019-07-30 NOTE — Consult Note (Addendum)
Pharmacy Antibiotic Note  Lawrence Osborn is a 68 y.o. male with diabetes, COPD, atrial fibrillation, alcoholic cirrhosis admitted on 07/30/2019 with wound infection. Patient reports recent injury to great toes on both feet several days ago. One injury has healed while the other has worsened with erythema and edema traveling up the leg. 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 Pharmacy has been consulted for vancomycin and ampicillin/sulbactam dosing.  Creatinine elevated from baseline of 0.7-0.8. Patient is afebrile with a WBC count of 7.9. He received one time doses of ampicillin/sulbactam 3 g and vancomycin 1 g in the ED.   Plan: Ampicillin/sulbactam 3 g q6h  Will order additional 1000 mg of vancomycin to complete loading dose of 2000 mg and then start maintenance regimen as below tomorrow.  Vancomycin 1500 mg IV Q 24 hrs. Goal AUC 400-550. Expected AUC: 474.6, trough 10.2 SCr used: 1.06  Continue to monitor daily Scr per protocol and adjust antibiotics as indicated.  Height: 5\' 6"  (167.6 cm) Weight: 250 lb (113.4 kg) IBW/kg (Calculated) : 63.8  Temp (24hrs), Avg:98.9 F (37.2 C), Min:98.9 F (37.2 C), Max:98.9 F (37.2 C)  Recent Labs  Lab 07/30/19 1238  WBC 7.9  CREATININE 1.06  LATICACIDVEN 2.3*    Estimated Creatinine Clearance: 81.1 mL/min (by C-G formula based on SCr of 1.06 mg/dL).    No Known Allergies  Antimicrobials this admission: Ampicillin/sulbactam 11/12 >>  Vancomycin 11/12 >>   Dose adjustments this admission: n/a  Microbiology results: 11/12 BCx: pending 11/12 COVID-19: pending  Thank you for allowing pharmacy to be a part of this patient's care.  [place] Resident 07/30/2019 3:04 PM

## 2019-07-30 NOTE — H&P (Addendum)
[facility] [place] [facility]   PATIENT NAME: Lawrence Osborn    MR#:  [id number]  DATE OF BIRTH:  02/21/1951  DATE OF ADMISSION:  07/30/2019  PRIMARY CARE PHYSICIAN: Danella PentonMiller, Mark F, MD   REQUESTING/REFERRING PHYSICIAN: Dr Dorothea GlassmanPaul MaLinda  CHIEF COMPLAINT:   Chief Complaint  Patient presents with  . Wound Infection    HISTORY OF PRESENT ILLNESS:  Lawrence Kocherhilip Curci  is a 68 y.o. male with history of diabetes.  He is coming in with a left first toe infection.  Been going on for a few days.  He has been sleeping 2 days straight and then was up for a little while and then slept for another half day.  He was clammy and dizzy.  He had a fall.  He has been nauseous for past few days.  They went and saw Dr. Hyacinth MeekerMiller and Dr. Hyacinth MeekerMiller sent him into the emergency room.  Now there is some redness streaking up the left leg.  PAST MEDICAL HISTORY:   Past Medical History:  Diagnosis Date  . COPD (chronic obstructive pulmonary disease) (HCC)   . Diabetes mellitus without complication (HCC)   . GI bleed   . Pneumothorax     PAST SURGICAL HISTORY:   Past Surgical History:  Procedure Laterality Date  . APPENDECTOMY    . HERNIA REPAIR      SOCIAL HISTORY:   Social History   Tobacco Use  . Smoking status: Never Smoker  . Smokeless tobacco: Never Used  Substance Use Topics  . Alcohol use: Yes    Alcohol/week: 21.0 standard drinks    Types: 21 Cans of beer per week    FAMILY HISTORY:   Family History  Problem Relation Age of Onset  . Hypothyroidism Mother   . CAD Father     DRUG ALLERGIES:  No Known Allergies  REVIEW OF SYSTEMS:  CONSTITUTIONAL: No fever, positive for chills and sweats.  Positive for weight loss.  Positive for fatigue and weakness.  EYES: No blurred or double vision.  Wears glasses. EARS, NOSE, AND THROAT: No tinnitus or ear pain. No sore throat.  Decreased hearing RESPIRATORY: No cough.positive for shortness of breath.  No wheezing or hemoptysis.   CARDIOVASCULAR: No chest pain, orthopnea, edema.  GASTROINTESTINAL: No nausea, vomiting, diarrhea or abdominal pain. No blood in bowel movements GENITOURINARY: No dysuria, hematuria.  ENDOCRINE: No polyuria, nocturia,  HEMATOLOGY: No anemia, easy bruising or bleeding SKIN: No rash or lesion. MUSCULOSKELETAL: Does not feel his feet. NEUROLOGIC: No tingling, numbness, weakness.  PSYCHIATRY: No anxiety or depression.   MEDICATIONS AT HOME:   Prior to Admission medications   Medication Sig Start Date End Date Taking? Authorizing Provider  apixaban (ELIQUIS) 5 MG TABS tablet 10 mg po bid for 6 days, then 5 mg po bid. 09/03/18  Yes Shaune Pollackhen, Qing, MD  diltiazem (CARDIZEM CD) 180 MG 24 hr capsule Take 180 mg by mouth 2 (two) times daily. 10/09/18  Yes [provider]  Dulaglutide (TRULICITY) 0.75 MG/0.5ML SOPN Inject 0.75 mg into the skin every Sunday.   Yes [provider]  Insulin Glargine (LANTUS SOLOSTAR) 100 UNIT/ML Solostar Pen Inject 25 Units into the skin 2 (two) times daily. 06/23/19 06/22/20 Yes [provider]  levothyroxine (SYNTHROID, LEVOTHROID) 100 MCG tablet Take 100 mcg by mouth daily before breakfast.   Yes [provider]  nadolol (CORGARD) 40 MG tablet Take 80 mg by mouth 2 (two) times daily.   Yes [provider]  omega-3 fish oil (MAXEPA) 1000 MG CAPS capsule Take 1 capsule by mouth 2 (two) times daily.   Yes [provider]  omeprazole (PRILOSEC) 20 MG capsule Take 20 mg by mouth daily.   Yes [provider]  QUEtiapine (SEROQUEL) 100 MG tablet Take 100-200 mg by mouth 2 (two) times daily. 100 mg in the morning and 200 mg at bedtime   Yes [provider]  traMADol (ULTRAM) 50 MG tablet Take 50 mg by mouth 2 (two) times daily.   Yes [provider]  albuterol (PROVENTIL HFA;VENTOLIN HFA) 108 (90 Base) MCG/ACT inhaler Inhale 2 puffs into the lungs every 6 (six) hours as needed for wheezing or shortness  of breath.    [provider]  digoxin (LANOXIN) 0.125 MG tablet Take 0.125 mg by mouth daily.    [provider]      VITAL SIGNS:  Blood pressure 100/76, pulse 95, temperature 98.9 F (37.2 C), temperature source Oral, resp. rate 20, height 5\' 6"  (1.676 m), weight 113.4 kg, SpO2 96 %.  PHYSICAL EXAMINATION:  GENERAL:  68 y.o.-year-old patient lying patient lying in the bed with no acute distress.  EYES: Pupils equal, round, reactive to light and accommodation. No scleral icterus. Extraocular muscles intact.  HEENT: Head atraumatic, normocephalic. Oropharynx and nasopharynx clear.  NECK:  Supple, no jugular venous distention. No thyroid enlargement, no tenderness.  LUNGS: Normal breath sounds bilaterally, no wheezing, rales,rhonchi or crepitation. No use of accessory muscles of respiration.  CARDIOVASCULAR: S1, S2 normal. No murmurs, rubs, or gallops.  ABDOMEN: Soft, nontender, distended. Bowel sounds present. No organomegaly or mass.  EXTREMITIES: Trace pedal edema.no cyanosis, or clubbing.  NEUROLOGIC: Cranial nerves II through XII are intact. Muscle strength 5/5 in all extremities. Sensation intact. Gait not checked.  PSYCHIATRIC: The patient is alert and oriented x 3.  SKIN: Erythema surrounding the first big toe with some fluctuance underneath the toenail.  Intermittent patchy redness going up the right leg and small amount in the right groin.  LABORATORY PANEL:   CBC Recent Labs  Lab 07/30/19 1238  WBC 7.9  HGB 13.8  HCT 42.5  PLT 98*   ------------------------------------------------------------------------------------------------------------------  Chemistries  Recent Labs  Lab 07/30/19 1238  NA 134*  K 4.1  CL 96*  CO2 26  GLUCOSE 253*  BUN 18  CREATININE 1.06  CALCIUM 9.2  AST 48*  ALT 25  ALKPHOS 126  BILITOT 1.7*   ------------------------------------------------------------------------------------------------------------------    RADIOLOGY:   Dg Foot 2 Views Left  Result Date: 07/30/2019 CLINICAL DATA:  Great toe infection. EXAM: LEFT FOOT - 2 VIEW COMPARISON:  None. FINDINGS: No acute fracture or dislocation. No osseous destruction or periosteal reaction. Mild degenerative changes of the first MCP and IP joints. Bone mineralization is normal. Diffuse soft tissue swelling. IMPRESSION: 1. Diffuse soft tissue swelling.  No acute osseous abnormality. Electronically Signed   By: 13/08/2019 M.D.   On: 07/30/2019 15:11    EKG:   Ordered by me  IMPRESSION AND PLAN:   1.  Clinical sepsis, present on admission, relative hypotension.  Toe and toenail infection.  There is purulence under the toenail.  There is also cellulitis going up the left leg.  Patient was given Unasyn and vancomycin.  I will continue these for now.  Podiatry consultation.  X-ray of the toe does not show osteomyelitis. 2.  Type 2 diabetes with hyperglycemia, neuropathy.  Send hemoglobin A1c.  Continue Lantus and sliding scale with short acting insulin prior to  meals. 3.  Lactic acidosis.  IV fluid hydration 4.  Atrial fibrillation on Eliquis for anticoagulation Cardizem and nadolol 5.  Alcohol abuse put on alcohol withdrawal protocol. 6.  Thrombocytopenia.  Looks like this is chronic dating back to last year. 7.  Looking back at old imaging studies they did comment on cirrhosis of the liver.  All the records, laboratory and radiological data are reviewed and case discussed with ED provider. Management plans discussed with the patient, family and they are in agreement.  CODE STATUS: full code  TOTAL TIME TAKING CARE OF THIS PATIENT: 50 minutes.    Loletha Grayer M.D on 07/30/2019 at 4:02 PM  Between 7am to 6pm - Pager - [phone]  After 6pm call admission pager [phone]  [facility]  CC: Primary care physician; Rusty Aus, MD

## 2019-07-30 NOTE — ED Notes (Signed)
Report given to Anna, RN 

## 2019-07-30 NOTE — ED Provider Notes (Signed)
[facility] Emergency Department Provider Note   ____________________________________________   First MD Initiated Contact with Patient 07/30/19 1344     (approximate)  I have reviewed the triage vital signs and the nursing notes.   HISTORY  Chief Complaint Wound Infection    HPI Lawrence Osborn is a 68 y.o. male with diabetes she reports she tripped and fell down the stairs injuring both of his great toes on both feet several days ago.  One is healed the other 1 is gotten red and swollen.  Now it is red and swollen and tender up the leg.  He is somewhat tachycardic.  Is no fever.  PCP reported blood pressure was low.  Here his blood pressures reported is soft but not low I cannot find a number.  Patient himself is awake and alert complains of pain and redness in the leg.  No other symptoms. He has a history of DVT and PE.     Past Medical History:  Diagnosis Date  . COPD (chronic obstructive pulmonary disease) (HCC)   . Diabetes mellitus without complication (HCC)   . GI bleed   . Pneumothorax     Patient Active Problem List   Diagnosis Date Noted  . Acute deep vein thrombosis (DVT) of proximal vein of right lower extremity (HCC)   . Acute pulmonary embolism (HCC)   . Thrombocytopenia (HCC)   . Elevated d-dimer 08/31/2018  . Diabetes (HCC) 08/30/2018  . COPD (chronic obstructive pulmonary disease) (HCC) 08/30/2018  . Alcohol use 08/30/2018  . Hypotensive episode 08/30/2018  . CAD (coronary artery disease) 08/30/2018  . PAF (paroxysmal atrial fibrillation) (HCC) 08/30/2018  . Hypothyroidism 08/30/2018  . Syncope 08/30/2018    Past Surgical History:  Procedure Laterality Date  . APPENDECTOMY    . HERNIA REPAIR      Prior to Admission medications   Medication Sig Start Date End Date Taking? Authorizing Provider  albuterol (PROVENTIL HFA;VENTOLIN HFA) 108 (90 Base) MCG/ACT inhaler Inhale 2 puffs into the lungs every 6 (six) hours as needed  for wheezing or shortness of breath.    [provider]  apixaban (ELIQUIS) 5 MG TABS tablet 10 mg po bid for 6 days, then 5 mg po bid. 09/03/18   Shaune Pollack, MD  digoxin (LANOXIN) 0.125 MG tablet Take 0.125 mg by mouth daily.    [provider]  Dulaglutide (TRULICITY) 0.75 MG/0.5ML SOPN Inject 0.75 mg into the skin every Sunday.    [provider]  levothyroxine (SYNTHROID, LEVOTHROID) 100 MCG tablet Take 100 mcg by mouth daily before breakfast.    [provider]  nadolol (CORGARD) 40 MG tablet Take 80 mg by mouth 2 (two) times daily.    [provider]  omega-3 fish oil (MAXEPA) 1000 MG CAPS capsule Take 1 capsule by mouth 2 (two) times daily.    [provider]  omeprazole (PRILOSEC) 20 MG capsule Take 20 mg by mouth daily.    [provider]  QUEtiapine (SEROQUEL) 100 MG tablet Take 100-200 mg by mouth 2 (two) times daily. 100 mg in the morning and 200 mg at bedtime    [provider]  traMADol (ULTRAM) 50 MG tablet Take 50 mg by mouth 2 (two) times daily.    [provider]    Allergies Patient has no known allergies.  History reviewed. No pertinent family history.  Social History Social History   Tobacco Use  . Smoking status: Never Smoker  . Smokeless tobacco:  Never Used  Substance Use Topics  . Alcohol use: Yes    Comment: occ  . Drug use: Not on file    Review of Systems  Constitutional: No fever/chills Eyes: No visual changes. ENT: No sore throat. Cardiovascular: Denies chest pain. Respiratory: Denies shortness of breath. Gastrointestinal: No abdominal pain.  No nausea, no vomiting.  No diarrhea.  No constipation. Genitourinary: Negative for dysuria. Musculoskeletal: Negative for back pain. Skin: Negative for rash. Neurological: Negative for headaches, focal weakness  ____________________________________________   PHYSICAL EXAM:  VITAL SIGNS: ED Triage Vitals  Enc Vitals Group      BP --      Pulse Rate 07/30/19 1224 (!) 114     Resp 07/30/19 1224 20     Temp 07/30/19 1224 98.9 F (37.2 C)     Temp Source 07/30/19 1224 Oral     SpO2 07/30/19 1224 96 %     Weight 07/30/19 1225 250 lb (113.4 kg)     Height 07/30/19 1225 5\' 6"  (1.676 m)     Head Circumference --      Peak Flow --      Pain Score 07/30/19 1225 3     Pain Loc --      Pain Edu? --      Excl. in GC? --     Constitutional: Alert and oriented. Well appearing and in no acute distress. Eyes: Conjunctivae are normal.  Head: Atraumatic. Nose: No congestion/rhinnorhea. Mouth/Throat: Mucous membranes are moist.  Oropharynx non-erythematous. Neck: No stridor. Cardiovascular: Normal rate, regular rhythm. Grossly normal heart sounds.  Good peripheral circulation. Respiratory: Normal respiratory effort.  No retractions. Lungs CTAB. Gastrointestinal: Soft and nontender. No distention. No abdominal bruits. No CVA tenderness. Musculoskeletal: Left lower leg is warm not particularly hot and red.  Tender especially in the calf.  There is some redness around the great toe on that side as well.  The other leg is cool and pale. Neurologic:  Normal speech and language. No gross focal neurologic deficits are appreciated.  Skin:  Skin is warm, dry and intact except for small area around the nail of the left great toe where there is some skin missing.     ____________________________________________   LABS (all labs ordered are listed, but only abnormal results are displayed)  Labs Reviewed  COMPREHENSIVE METABOLIC PANEL - Abnormal; Notable for the following components:      Result Value   Sodium 134 (*)    Chloride 96 (*)    Glucose, Bld 253 (*)    Albumin 3.2 (*)    AST 48 (*)    Total Bilirubin 1.7 (*)    All other components within normal limits  LACTIC ACID, PLASMA - Abnormal; Notable for the following components:   Lactic Acid, Venous 2.3 (*)    All other components within normal limits  CBC WITH  DIFFERENTIAL/PLATELET - Abnormal; Notable for the following components:   RDW 17.1 (*)    Platelets 98 (*)    All other components within normal limits  PROTIME-INR - Abnormal; Notable for the following components:   Prothrombin Time 19.2 (*)    INR 1.6 (*)    All other components within normal limits  CULTURE, BLOOD (ROUTINE X 2)  CULTURE, BLOOD (ROUTINE X 2)  LACTIC ACID, PLASMA  URINALYSIS, COMPLETE (UACMP) WITH MICROSCOPIC   ____________________________________________  EKG   ____________________________________________  RADIOLOGY  ED MD interpretation:    Official radiology report(s): No results found.  ____________________________________________   PROCEDURES  Procedure(s) performed (including Critical Care):  Procedures   ____________________________________________   INITIAL IMPRESSION / ASSESSMENT AND PLAN / ED COURSE  Patient with red swollen tender left lower extremity.  History of DVT.  We will get an ultrasound and then plan on admitting this gentleman for cellulitis which is most likely since he is already on a blood thinner.              ____________________________________________   FINAL CLINICAL IMPRESSION(S) / ED DIAGNOSES  Final diagnoses:  Cellulitis of left lower extremity  Elevated lactic acid level     ED Discharge Orders    None       Note:  This document was prepared using Dragon voice recognition software and may include unintentional dictation errors.    Nena Polio, MD 07/30/19 [id number]

## 2019-07-30 NOTE — ED Triage Notes (Signed)
Pt arrived for infection to LLE.  Has found to left great toe from 3 weeks ago that has not gotten any better. Now has redness going up leg. Pain from toe to left groin.  No fever.  Tachy. Sent from PCP for possible sepsis.  BP low at PCP, soft at this time compared to normal but not low.

## 2019-07-30 NOTE — Consult Note (Signed)
ORTHOPAEDIC CONSULTATION  REQUESTING PHYSICIAN: Alford Highland, MD  Chief Complaint: Left great toenail infection and cellulitis left leg  HPI: Lawrence Osborn is a 68 y.o. male who complains of worsening redness to his left leg over the last few days.  Has had some lethargy and has been sleeping quite a bit.  Has noticed redness to his left great toe and swelling to his leg.  Seen by his primary care provider and then referred to the ER.  Been admitted with cellulitis of his left leg.  X-ray of his left foot is negative for osteomyelitis.  Past Medical History:  Diagnosis Date  . COPD (chronic obstructive pulmonary disease) (HCC)   . Diabetes mellitus without complication (HCC)   . GI bleed   . Pneumothorax    Past Surgical History:  Procedure Laterality Date  . APPENDECTOMY    . HERNIA REPAIR     Social History   Socioeconomic History  . Marital status: Married    Spouse name: Not on file  . Number of children: Not on file  . Years of education: Not on file  . Highest education level: Not on file  Occupational History  . Not on file  Social Needs  . Financial resource strain: Not on file  . Food insecurity    Worry: Not on file    Inability: Not on file  . Transportation needs    Medical: Not on file    Non-medical: Not on file  Tobacco Use  . Smoking status: Never Smoker  . Smokeless tobacco: Never Used  Substance and Sexual Activity  . Alcohol use: Yes    Alcohol/week: 21.0 standard drinks    Types: 21 Cans of beer per week  . Drug use: Not on file  . Sexual activity: Not on file  Lifestyle  . Physical activity    Days per week: Not on file    Minutes per session: Not on file  . Stress: Not on file  Relationships  . Social Musician on phone: Not on file    Gets together: Not on file    Attends religious service: Not on file    Active member of club or organization: Not on file    Attends meetings of clubs or organizations: Not on file   Relationship status: Not on file  Other Topics Concern  . Not on file  Social History Narrative  . Not on file   Family History  Problem Relation Age of Onset  . Hypothyroidism Mother   . CAD Father    No Known Allergies Prior to Admission medications   Medication Sig Start Date End Date Taking? Authorizing Provider  apixaban (ELIQUIS) 5 MG TABS tablet 10 mg po bid for 6 days, then 5 mg po bid. 09/03/18  Yes Shaune Pollack, MD  diltiazem (CARDIZEM CD) 180 MG 24 hr capsule Take 180 mg by mouth 2 (two) times daily. 10/09/18  Yes [provider]  Dulaglutide (TRULICITY) 0.75 MG/0.5ML SOPN Inject 0.75 mg into the skin every Sunday.   Yes [provider]  Insulin Glargine (LANTUS SOLOSTAR) 100 UNIT/ML Solostar Pen Inject 25 Units into the skin 2 (two) times daily. 06/23/19 06/22/20 Yes [provider]  levothyroxine (SYNTHROID, LEVOTHROID) 100 MCG tablet Take 100 mcg by mouth daily before breakfast.   Yes [provider]  nadolol (CORGARD) 40 MG tablet Take 80 mg by mouth 2 (two) times daily.   Yes [provider]  omega-3 fish oil (  MAXEPA) 1000 MG CAPS capsule Take 1 capsule by mouth 2 (two) times daily.   Yes [provider]  omeprazole (PRILOSEC) 20 MG capsule Take 20 mg by mouth daily.   Yes [provider]  QUEtiapine (SEROQUEL) 100 MG tablet Take 100-200 mg by mouth 2 (two) times daily. 100 mg in the morning and 200 mg at bedtime   Yes [provider]  traMADol (ULTRAM) 50 MG tablet Take 50 mg by mouth 2 (two) times daily.   Yes [provider]  albuterol (PROVENTIL HFA;VENTOLIN HFA) 108 (90 Base) MCG/ACT inhaler Inhale 2 puffs into the lungs every 6 (six) hours as needed for wheezing or shortness of breath.    [provider]  digoxin (LANOXIN) 0.125 MG tablet Take 0.125 mg by mouth daily.    [provider]   Dg Foot 2 Views Left  Result Date: 07/30/2019 CLINICAL DATA:  Great toe infection.  EXAM: LEFT FOOT - 2 VIEW COMPARISON:  None. FINDINGS: No acute fracture or dislocation. No osseous destruction or periosteal reaction. Mild degenerative changes of the first MCP and IP joints. Bone mineralization is normal. Diffuse soft tissue swelling. IMPRESSION: 1. Diffuse soft tissue swelling.  No acute osseous abnormality. Electronically Signed   By: Titus Dubin M.D.   On: 07/30/2019 15:11    Positive ROS: All other systems have been reviewed and were otherwise negative with the exception of those mentioned in the HPI and as above.  12 point ROS was performed.  Physical Exam: General: Alert and oriented.  No apparent distress.  Vascular:  Left foot:Dorsalis Pedis:  diminished Posterior Tibial:  diminished  Right foot: Dorsalis Pedis:  present Posterior Tibial:  present  Neuro:absent detective sensation  Derm: Right foot with just some mild ecchymosis.  He states he does occasionally bump his right foot and left foot due to his neuropathy he does not feel it.  Overall though the right foot seems to be stable.  No blistering.  He does have cellulitis of his left lower leg from the knee to the ankle.  He has a loose left great toenail.  This was quite loose.  I was able to perform a bedside nail avulsion without incident.  The underlying nail bed is fairly healthy.  There was no purulence but just some foul odor consistent with drainage beneath the left great toenail.  There is no tunneling tracts or erosive changes.  No open ulceration.  Ortho/MS: Diffuse edema to his left leg.  No pain.  Patient is completely neuropathic.  Assessment: Cellulitis left leg likely source is left great toenail paronychia.  Plan: I was able to perform a bedside removal of the nail itself.  The nail was virtually completely loose with just a small corner of the nail intact.  No anesthesia was needed.  The nail was removed.  The underlying nailbed was cleansed with Betadine.  A sterile dressing was  applied.  There was no purulence.  Patient is to be admitted for cellulitis of his left leg.  This should cover any concern for paronychia and infection of his left great toe.  Orders written for Betadine dressings to be performed daily to his left great toe.  No further I&D needed of the left great toe at this time.  Patient should follow-up with podiatry in 2 to 3 weeks upon discharge.    Elesa Hacker, DPM Cell [phone]   07/30/2019 5:31 PM

## 2019-07-31 ENCOUNTER — Other Ambulatory Visit: Payer: Self-pay

## 2019-07-31 DIAGNOSIS — L089 Local infection of the skin and subcutaneous tissue, unspecified: Secondary | ICD-10-CM

## 2019-07-31 DIAGNOSIS — K7030 Alcoholic cirrhosis of liver without ascites: Secondary | ICD-10-CM

## 2019-07-31 LAB — CBC
HCT: 37.4 % — ABNORMAL LOW (ref 39.0–52.0)
Hemoglobin: 12.2 g/dL — ABNORMAL LOW (ref 13.0–17.0)
MCH: 27.7 pg (ref 26.0–34.0)
MCHC: 32.6 g/dL (ref 30.0–36.0)
MCV: 84.8 fL (ref 80.0–100.0)
Platelets: 81 10*3/uL — ABNORMAL LOW (ref 150–400)
RBC: 4.41 MIL/uL (ref 4.22–5.81)
RDW: 17.1 % — ABNORMAL HIGH (ref 11.5–15.5)
WBC: 5.4 10*3/uL (ref 4.0–10.5)
nRBC: 0 % (ref 0.0–0.2)

## 2019-07-31 LAB — URINALYSIS, COMPLETE (UACMP) WITH MICROSCOPIC
Bilirubin Urine: NEGATIVE
Glucose, UA: 50 mg/dL — AB
Ketones, ur: NEGATIVE mg/dL
Nitrite: NEGATIVE
Protein, ur: NEGATIVE mg/dL
Specific Gravity, Urine: 1.031 — ABNORMAL HIGH (ref 1.005–1.030)
pH: 5 (ref 5.0–8.0)

## 2019-07-31 LAB — BASIC METABOLIC PANEL
Anion gap: 8 (ref 5–15)
BUN: 16 mg/dL (ref 8–23)
CO2: 28 mmol/L (ref 22–32)
Calcium: 8.4 mg/dL — ABNORMAL LOW (ref 8.9–10.3)
Chloride: 102 mmol/L (ref 98–111)
Creatinine, Ser: 0.86 mg/dL (ref 0.61–1.24)
GFR calc Af Amer: >60 mL/min (ref >60)
GFR calc non Af Amer: >60 mL/min (ref >60)
Glucose, Bld: 130 mg/dL — ABNORMAL HIGH (ref 70–99)
Potassium: 4 mmol/L (ref 3.5–5.1)
Sodium: 138 mmol/L (ref 135–145)

## 2019-07-31 LAB — GLUCOSE, CAPILLARY
Glucose-Capillary: 140 mg/dL — ABNORMAL HIGH (ref 70–99)
Glucose-Capillary: 156 mg/dL — ABNORMAL HIGH (ref 70–99)
Glucose-Capillary: 156 mg/dL — ABNORMAL HIGH (ref 70–99)
Glucose-Capillary: 189 mg/dL — ABNORMAL HIGH (ref 70–99)

## 2019-07-31 LAB — SARS CORONAVIRUS 2 (TAT 6-24 HRS): SARS Coronavirus 2: NEGATIVE

## 2019-07-31 LAB — AMMONIA: Ammonia: 31 umol/L (ref 9–35)

## 2019-07-31 MED ORDER — NADOLOL 40 MG PO TABS
80.0000 mg | ORAL_TABLET | Freq: Every day | ORAL | Status: DC
  Administered 2019-08-01 – 2019-08-03 (×3): 80 mg via ORAL
  Filled 2019-07-31 (×3): qty 2

## 2019-07-31 MED ORDER — SODIUM CHLORIDE 0.9 % IV SOLN
INTRAVENOUS | Status: DC | PRN
  Administered 2019-07-31 – 2019-08-03 (×4): via INTRAVENOUS

## 2019-07-31 MED ORDER — DILTIAZEM HCL ER COATED BEADS 180 MG PO CP24
180.0000 mg | ORAL_CAPSULE | Freq: Every day | ORAL | Status: DC
  Administered 2019-08-01: 12:00:00 180 mg via ORAL
  Filled 2019-07-31: qty 1

## 2019-07-31 MED ORDER — CEFAZOLIN SODIUM-DEXTROSE 1-4 GM/50ML-% IV SOLN
1.0000 g | Freq: Three times a day (TID) | INTRAVENOUS | Status: DC
  Administered 2019-07-31 – 2019-08-03 (×9): 1 g via INTRAVENOUS
  Filled 2019-07-31 (×14): qty 50

## 2019-07-31 NOTE — Consult Note (Signed)
Pharmacy Antibiotic Note  Lawrence Osborn is a 68 y.o. male admitted on 07/30/2019 with cellulitis.  PMH significant for diabetes, COPD, atrial fibrillation, alcoholic cirrhosis admitted on 07/30/2019 with wound infection. Patient reports recent injury to great toes on both feet several days ago. One injury has healed while the other has worsened with erythema and edema traveling up the leg. Per MD, there is purulence under the toenail. Podiatry consulted to remove the toenail. The toenail removed and no noted purulence. 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  Pharmacy has been consulted for Cefazolin dosing.  He received one time doses of ampicillin/sulbactam 3 g and vancomycin 1 g in the ED.   Plan: 1. Cefazolin 1g Q8H  Height: 5\' 6"  (167.6 cm) Weight: 250 lb (113.4 kg) IBW/kg (Calculated) : 63.8  Temp (24hrs), Avg:98.5 F (36.9 C), Min:97.9 F (36.6 C), Max:98.9 F (37.2 C)  Recent Labs  Lab 07/30/19 1238 07/30/19 1618 07/30/19 1644 07/31/19 0500  WBC 7.9 5.7  --  5.4  CREATININE 1.06 0.93  --  0.86  LATICACIDVEN 2.3*  --  1.6  --     Estimated Creatinine Clearance: 99.9 mL/min (by C-G formula based on SCr of 0.86 mg/dL).    No Known Allergies  Antimicrobials this admission: 11/12 Vancomycin > 11/12 11/12 Unasyn >> 11/13 11/13 Cefazolin >>   Dose adjustments this admission: N/A  Microbiology results: 11/12 BCx: NGTD 11/13 UCx: pending  11/12 MRSA PCR: pending   Thank you for allowing pharmacy to be a part of this patient's care.  Rowland Lathe 07/31/2019 8:16 AM

## 2019-07-31 NOTE — Progress Notes (Signed)
PT Cancellation Note  Patient Details Name: Lawrence Osborn MRN: [id number] DOB: [birth year]   Cancelled Treatment:    Reason Eval/Treat Not Completed: Other (comment). Consult received and chart reviewed. Pt very pleasant and sitting at EOB with independence. Pt reports he is at baseline level and is able to stand and ambulate around room without assist. Pt reports pain with L toe, however doesn't appear to limit mobility. Will dc current orders. Please re-order if needs change.   Kayce Betty 07/31/2019, 3:17 PM  Greggory Stallion, PT, DPT [phone]

## 2019-07-31 NOTE — Progress Notes (Signed)
Patient refusing bed alarm 

## 2019-07-31 NOTE — TOC Initial Note (Signed)
Transition of Care [facility]) - Initial/Assessment Note    Patient Details  Name: Lawrence Osborn MRN: [id number] Date of Birth: 10-Dec-1950  Transition of Care [facility]) CM/SW Contact:    Virgel Manifold, RN Phone Number: 07/31/2019, 10:54 AM  Clinical Narrative:  [facility] team consulted for disposition needs and assessment. Patient currently lives at home with his wife. He reports he is completely independent with activities of daily living and until his most recent fall had no falls before. Patient reports he still drives and uses no DME at home. Patient has refused the bed alarm here and tells me there is no need for a PT consult. Patient has a small dressing in place for toe wound. Patient tells me if there is a need for dressing care at home he and his wife could manage it since he is not interested in anyone coming in his home. TOC will sign off but can be re consulted should new needs arise.                 Expected Discharge Plan: Home/Self Care Barriers to Discharge: Continued Medical Work up   Patient Goals and CMS Choice     Choice offered to / list presented to : Patient  Expected Discharge Plan and Services Expected Discharge Plan: Home/Self Care   Discharge Planning Services: CM Consult   Living arrangements for the past 2 months: Single Family Home                                      Prior Living Arrangements/Services Living arrangements for the past 2 months: Single Family Home Lives with:: Spouse Patient language and need for interpreter reviewed:: Yes Do you feel safe going back to the place where you live?: Yes      Need for Family Participation in Patient Care: No (Comment)     Criminal Activity/Legal Involvement Pertinent to Current Situation/Hospitalization: No - Comment as needed  Activities of Daily Living Home Assistive Devices/Equipment: None ADL Screening (condition at time of admission) Patient's cognitive ability adequate to safely complete daily  activities?: Yes Is the patient deaf or have difficulty hearing?: No Does the patient have difficulty seeing, even when wearing glasses/contacts?: No Does the patient have difficulty concentrating, remembering, or making decisions?: No Patient able to express need for assistance with ADLs?: Yes Does the patient have difficulty dressing or bathing?: No Independently performs ADLs?: Yes (appropriate for developmental age) Does the patient have difficulty walking or climbing stairs?: No Weakness of Legs: None Weakness of Arms/Hands: None  Permission Sought/Granted                  Emotional Assessment              Admission diagnosis:  Cellulitis of left lower extremity [L03.116] Toe infection [L08.9] Elevated lactic acid level [R79.89] Patient Active Problem List   Diagnosis Date Noted  . Sepsis (HCC) 07/30/2019  . Infection of toenail   . Cellulitis of left lower extremity   . Elevated lactic acid level   . Acute deep vein thrombosis (DVT) of proximal vein of right lower extremity (HCC)   . Acute pulmonary embolism (HCC)   . Thrombocytopenia (HCC)   . Elevated d-dimer 08/31/2018  . Type 2 diabetes, uncontrolled, with neuropathy (HCC) 08/30/2018  . COPD (chronic obstructive pulmonary disease) (HCC) 08/30/2018  . Alcohol use 08/30/2018  . Hypotension 08/30/2018  .  CAD (coronary artery disease) 08/30/2018  . PAF (paroxysmal atrial fibrillation) ([place]) 08/30/2018  . Hypothyroidism 08/30/2018  . Syncope 08/30/2018   PCP:  Rusty Aus, MD Pharmacy:   [facility] [phone] - [address] [place] [address] Phone: [phone] Fax: [phone]     Social Determinants of Health (SDOH) Interventions    Readmission Risk Interventions Readmission Risk Prevention Plan 07/31/2019  Transportation Screening Complete  PCP or Specialist Appt within 5-7 Days Complete  Home Care Screening  Complete  Medication Review (RN CM) Complete  Some recent data might be hidden

## 2019-07-31 NOTE — Progress Notes (Signed)
Patient ID: Lawrence Kocherhilip Meixner, male   DOB: 1950/10/26, 68 y.o.   MRN: [id number] [facility] PROGRESS NOTE  Lawrence Osborn JYN:[id number] DOB: 1950/10/26 DOA: 07/30/2019 PCP: Danella PentonMiller, Mark F, MD  HPI/Subjective: Patient still having pain in his left leg.  Cannot feel his toes from his neuropathy.  The podiatrist took off his toenail yesterday.  Still having some trouble with putting weight down on his leg.  Objective: Vitals:   07/31/19 0436 07/31/19 0844  BP: 99/66 110/72  Pulse: 94   Resp: (!) 24   Temp: 98.7 F (37.1 C)   SpO2: 94%     Intake/Output Summary (Last 24 hours) at 07/31/2019 1234 Last data filed at 07/31/2019 0809 Gross per 24 hour  Intake 1294.09 ml  Output 900 ml  Net 394.09 ml   Filed Weights   07/30/19 1225  Weight: 113.4 kg    ROS: Review of Systems  Constitutional: Negative for chills and fever.  Eyes: Negative for blurred vision.  Respiratory: Negative for cough and shortness of breath.   Cardiovascular: Negative for chest pain.  Gastrointestinal: Negative for abdominal pain, constipation, diarrhea, nausea and vomiting.  Genitourinary: Negative for dysuria.  Musculoskeletal: Positive for joint pain.  Neurological: Negative for dizziness and headaches.   Exam: Physical Exam  Constitutional: He is oriented to person, place, and time.  HENT:  Nose: No mucosal edema.  Mouth/Throat: No oropharyngeal exudate or posterior oropharyngeal edema.  Eyes: Pupils are equal, round, and reactive to light. Conjunctivae, EOM and lids are normal.  Neck: No JVD present. Carotid bruit is not present. No edema present. No thyroid mass and no thyromegaly present.  Cardiovascular: S1 normal and S2 normal. Exam reveals no gallop.  No murmur heard. Pulses:      Dorsalis pedis pulses are 2+ on the right side and 2+ on the left side.  Respiratory: No respiratory distress. He has no wheezes. He has no rhonchi. He has no rales.  GI: Soft. Bowel sounds are normal. He  exhibits distension. There is no abdominal tenderness.  Musculoskeletal:     Right ankle: He exhibits no swelling.     Left ankle: He exhibits swelling.  Lymphadenopathy:    He has no cervical adenopathy.  Neurological: He is alert and oriented to person, place, and time. No cranial nerve deficit.  Skin: Skin is warm. Nails show no clubbing.  Left first toe toenail removed.  Redness around the first toe has improved.  Still with redness and warmth on the left shin.  The redness and warmth in the left groin has subsided.  Psychiatric: He has a normal mood and affect.      Data Reviewed: Basic Metabolic Panel: Recent Labs  Lab 07/30/19 1238 07/30/19 1618 07/31/19 0500  NA 134* 134* 138  K 4.1 3.8 4.0  CL 96* 97* 102  CO2 26 27 28   GLUCOSE 253* 246* 130*  BUN 18 19 16   CREATININE 1.06 0.93 0.86  CALCIUM 9.2 8.4* 8.4*  MG  --  2.0  --   PHOS  --  3.4  --    Liver Function Tests: Recent Labs  Lab 07/30/19 1238 07/30/19 1618  AST 48* 43*  ALT 25 21  ALKPHOS 126 113  BILITOT 1.7* 1.5*  PROT 7.4 6.6  ALBUMIN 3.2* 2.8*    Recent Labs  Lab 07/31/19 0500  AMMONIA 31   CBC: Recent Labs  Lab 07/30/19 1238 07/30/19 1618 07/31/19 0500  WBC 7.9 5.7 5.4  NEUTROABS 5.9  --   --  HGB 13.8 12.6* 12.2*  HCT 42.5 39.4 37.4*  MCV 84.8 87.2 84.8  PLT 98* 80* 81*    CBG: Recent Labs  Lab 07/30/19 1757 07/30/19 2116 07/31/19 0757 07/31/19 1145  GLUCAP 231* 179* 140* 156*    Recent Results (from the past 240 hour(s))  Culture, blood (Routine x 2)     Status: None (Preliminary result)   Collection Time: 07/30/19 12:30 PM   Specimen: BLOOD  Result Value Ref Range Status   Specimen Description BLOOD RIGHT ANTECUBITAL  Final   Special Requests   Final    BOTTLES DRAWN AEROBIC AND ANAEROBIC Blood Culture adequate volume   Culture   Final    NO GROWTH < 24 HOURS Performed at [facility], [address] [place], [place] [address]    Report Status  PENDING  Incomplete  Culture, blood (Routine x 2)     Status: None (Preliminary result)   Collection Time: 07/30/19 12:36 PM   Specimen: BLOOD  Result Value Ref Range Status   Specimen Description BLOOD BLOOD RIGHT HAND  Final   Special Requests   Final    BOTTLES DRAWN AEROBIC AND ANAEROBIC Blood Culture adequate volume   Culture   Final    NO GROWTH < 24 HOURS Performed at [facility], [address] [place], [place] [address]    Report Status PENDING  Incomplete  SARS CORONAVIRUS 2 (TAT 6-24 HRS) Nasopharyngeal Nasopharyngeal Swab     Status: None   Collection Time: 07/30/19  4:44 PM   Specimen: Nasopharyngeal Swab  Result Value Ref Range Status   SARS Coronavirus 2 NEGATIVE NEGATIVE Final    Comment: (NOTE) SARS-CoV-2 target nucleic acids are NOT DETECTED. The SARS-CoV-2 RNA is generally detectable in upper and lower respiratory specimens during the acute phase of infection. Negative results do not preclude SARS-CoV-2 infection, do not rule out co-infections with other pathogens, and should not be used as the sole basis for treatment or other patient management decisions. Negative results must be combined with clinical observations, patient history, and epidemiological information. The expected result is Negative. Fact Sheet for Patients: SugarRoll.be Fact Sheet for Healthcare Providers: https://www.woods-mathews.com/ This test is not yet approved or cleared by the [place] FDA and  has been authorized for detection and/or diagnosis of SARS-CoV-2 by FDA under an Emergency Use Authorization (EUA). This EUA will remain  in effect (meaning this test can be used) for the duration of the COVID-19 declaration under Section 56 4(b)(1) of the Act, 21 U.S.C. section 360bbb-3(b)(1), unless the authorization is terminated or revoked sooner. Performed at [facility], [place] [address], [place], [place] [address]       Studies: Dg Foot 2 Views Left  Result Date: 07/30/2019 CLINICAL DATA:  Great toe infection. EXAM: LEFT FOOT - 2 VIEW COMPARISON:  None. FINDINGS: No acute fracture or dislocation. No osseous destruction or periosteal reaction. Mild degenerative changes of the first MCP and IP joints. Bone mineralization is normal. Diffuse soft tissue swelling. IMPRESSION: 1. Diffuse soft tissue swelling.  No acute osseous abnormality. Electronically Signed   By: Titus Dubin M.D.   On: 07/30/2019 15:11    Scheduled Meds: . apixaban  5 mg Oral BID  . diltiazem  180 mg Oral BID  . folic acid  1 mg Oral Daily  . insulin aspart  0-5 Units Subcutaneous QHS  . insulin aspart  0-9 Units Subcutaneous TID WC  . insulin aspart  4 Units Subcutaneous TID WC  .  insulin glargine  25 Units Subcutaneous BID  . levothyroxine  100 mcg Oral QAC breakfast  . multivitamin with minerals  1 tablet Oral Daily  . nadolol  80 mg Oral BID  . omega-3 acid ethyl esters  1 capsule Oral BID  . pantoprazole  40 mg Oral Daily  . QUEtiapine  100 mg Oral q morning - 10a   And  . QUEtiapine  200 mg Oral QHS  . thiamine  100 mg Oral Daily   Or  . thiamine  100 mg Intravenous Daily  . traMADol  50 mg Oral BID   Continuous Infusions: . sodium chloride 10 mL/hr at 07/31/19 1101  .  ceFAZolin (ANCEF) IV 1 g (07/31/19 1102)    Assessment/Plan:  1. Clinical sepsis present on admission, relative hypotension.  Patient with left first toe infection and infection underneath the toenail.  Patient also had cellulitis going up the left leg.  Initially in the ER given aggressive antibiotics.  With the toenail removed by podiatry yesterday they did not see any purulence under the toenail.  Will downgrade antibiotics to Ancef.  X-ray of the toe did not show any signs of osteomyelitis. 2. 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  Hemoglobin A1c elevated at 9.1.  Continue Lantus and sliding scale.  Short acting insulin prior to  meals. 3. Lactic acidosis.  Patient was given IV fluid hydration since yesterday.  Will discontinue IV fluids. 4. Chronic atrial fibrillation on Eliquis for anticoagulation.  Cardizem and nadolol for rate control. 5. 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 6. Alcohol abuse.  Patient is on alcohol abuse withdrawal protocol.  Currently no signs of withdrawal. 7. We will get physical therapy evaluation  Code Status:     Code Status Orders  (From admission, onward)         Start     Ordered   07/30/19 1452  Full code  Continuous     07/30/19 1452        Code Status History    Date Active Date Inactive Code Status Order ID Comments User Context   08/30/2018 0257 09/03/2018 1828 Full Code [id number]  Oralia Manis, MD Inpatient   Advance Care Planning Activity     Family Communication: Left message for wife on the phone Disposition Plan: To be determined  Consultants:  Podiatry  Procedures:  Toenail removal  Antibiotics:  Ancef  Time spent: 28 minutes  [employer]ol [employer]

## 2019-08-01 ENCOUNTER — Inpatient Hospital Stay: Payer: Medicare Other

## 2019-08-01 LAB — GLUCOSE, CAPILLARY
Glucose-Capillary: 109 mg/dL — ABNORMAL HIGH (ref 70–99)
Glucose-Capillary: 148 mg/dL — ABNORMAL HIGH (ref 70–99)
Glucose-Capillary: 131 mg/dL — ABNORMAL HIGH (ref 70–99)
Glucose-Capillary: 138 mg/dL — ABNORMAL HIGH (ref 70–99)

## 2019-08-01 MED ORDER — DILTIAZEM HCL ER COATED BEADS 120 MG PO CP24
240.0000 mg | ORAL_CAPSULE | Freq: Every day | ORAL | Status: DC
  Administered 2019-08-02 – 2019-08-03 (×2): 240 mg via ORAL
  Filled 2019-08-01: qty 2

## 2019-08-01 NOTE — Progress Notes (Signed)
Patient ID: Lawrence Osborn, male   DOB: 1951-06-23, 68 y.o.   MRN: [id number] [facility] PROGRESS NOTE  Lawrence Osborn [id number] DOB: 1950-11-19 DOA: 07/30/2019 PCP: Rusty Aus, MD  HPI/Subjective: Patient still having pain in his left lower extremity.  Cellulitis seems to be improving a little bit.  Otherwise feels okay.  Abdomen only is a little distended.  Objective: Vitals:   08/01/19 0503 08/01/19 1141  BP: (!) 125/95 115/82  Pulse: (!) 104 (!) 109  Resp: 20 18  Temp: 97.9 F (36.6 C) 98.1 F (36.7 C)  SpO2: 95% 96%    Intake/Output Summary (Last 24 hours) at 08/01/2019 1544 Last data filed at 08/01/2019 1025 Gross per 24 hour  Intake 584.39 ml  Output 400 ml  Net 184.39 ml   Filed Weights   07/30/19 1225  Weight: 113.4 kg    ROS: Review of Systems  Constitutional: Negative for chills and fever.  Eyes: Negative for blurred vision.  Respiratory: Negative for cough and shortness of breath.   Cardiovascular: Negative for chest pain.  Gastrointestinal: Negative for abdominal pain, constipation, diarrhea, nausea and vomiting.  Genitourinary: Negative for dysuria.  Musculoskeletal: Positive for joint pain.  Neurological: Negative for dizziness and headaches.   Exam: Physical Exam  Constitutional: He is oriented to person, place, and time.  HENT:  Nose: No mucosal edema.  Mouth/Throat: No oropharyngeal exudate or posterior oropharyngeal edema.  Eyes: Pupils are equal, round, and reactive to light. Conjunctivae, EOM and lids are normal.  Neck: No JVD present. Carotid bruit is not present. No edema present. No thyroid mass and no thyromegaly present.  Cardiovascular: S1 normal and S2 normal. Exam reveals no gallop.  No murmur heard. Pulses:      Dorsalis pedis pulses are 2+ on the right side and 2+ on the left side.  Respiratory: No respiratory distress. He has no wheezes. He has no rhonchi. He has no rales.  GI: Soft. Bowel sounds are normal. He exhibits  distension. There is no abdominal tenderness.  Musculoskeletal:     Right ankle: He exhibits no swelling.     Left ankle: He exhibits swelling.  Lymphadenopathy:    He has no cervical adenopathy.  Neurological: He is alert and oriented to person, place, and time. No cranial nerve deficit.  Skin: Skin is warm. Nails show no clubbing.  Left first toe toenail removed.  Redness around the first toe has improved.  Still with redness and warmth on the left shin.  The redness and warmth in the left groin has subsided.  Psychiatric: He has a normal mood and affect.      Data Reviewed: Basic Metabolic Panel: Recent Labs  Lab 07/30/19 1238 07/30/19 1618 07/31/19 0500  NA 134* 134* 138  K 4.1 3.8 4.0  CL 96* 97* 102  CO2 26 27 28   GLUCOSE 253* 246* 130*  BUN 18 19 16   CREATININE 1.06 0.93 0.86  CALCIUM 9.2 8.4* 8.4*  MG  --  2.0  --   PHOS  --  3.4  --    Liver Function Tests: Recent Labs  Lab 07/30/19 1238 07/30/19 1618  AST 48* 43*  ALT 25 21  ALKPHOS 126 113  BILITOT 1.7* 1.5*  PROT 7.4 6.6  ALBUMIN 3.2* 2.8*    Recent Labs  Lab 07/31/19 0500  AMMONIA 31   CBC: Recent Labs  Lab 07/30/19 1238 07/30/19 1618 07/31/19 0500  WBC 7.9 5.7 5.4  NEUTROABS 5.9  --   --  HGB 13.8 12.6* 12.2*  HCT 42.5 39.4 37.4*  MCV 84.8 87.2 84.8  PLT 98* 80* 81*    CBG: Recent Labs  Lab 07/31/19 1145 07/31/19 1639 07/31/19 2101 08/01/19 0739 08/01/19 1139  GLUCAP 156* 156* 189* 109* 148*    Recent Results (from the past 240 hour(s))  Culture, blood (Routine x 2)     Status: None (Preliminary result)   Collection Time: 07/30/19 12:30 PM   Specimen: BLOOD  Result Value Ref Range Status   Specimen Description BLOOD RIGHT ANTECUBITAL  Final   Special Requests   Final    BOTTLES DRAWN AEROBIC AND ANAEROBIC Blood Culture adequate volume   Culture   Final    NO GROWTH 2 DAYS Performed at [facility], [address] [place], [place] [address]    Report  Status PENDING  Incomplete  Culture, blood (Routine x 2)     Status: None (Preliminary result)   Collection Time: 07/30/19 12:36 PM   Specimen: BLOOD  Result Value Ref Range Status   Specimen Description BLOOD BLOOD RIGHT HAND  Final   Special Requests   Final    BOTTLES DRAWN AEROBIC AND ANAEROBIC Blood Culture adequate volume   Culture   Final    NO GROWTH 2 DAYS Performed at [facility], [address] [place], [place] [address]    Report Status PENDING  Incomplete  SARS CORONAVIRUS 2 (TAT 6-24 HRS) Nasopharyngeal Nasopharyngeal Swab     Status: None   Collection Time: 07/30/19  4:44 PM   Specimen: Nasopharyngeal Swab  Result Value Ref Range Status   SARS Coronavirus 2 NEGATIVE NEGATIVE Final    Comment: (NOTE) SARS-CoV-2 target nucleic acids are NOT DETECTED. The SARS-CoV-2 RNA is generally detectable in upper and lower respiratory specimens during the acute phase of infection. Negative results do not preclude SARS-CoV-2 infection, do not rule out co-infections with other pathogens, and should not be used as the sole basis for treatment or other patient management decisions. Negative results must be combined with clinical observations, patient history, and epidemiological information. The expected result is Negative. Fact Sheet for Patients: HairSlick.nohttps://www.fda.gov/media/138098/download Fact Sheet for Healthcare Providers: quierodirigir.comhttps://www.fda.gov/media/138095/download This test is not yet approved or cleared by the [place] FDA and  has been authorized for detection and/or diagnosis of SARS-CoV-2 by FDA under an Emergency Use Authorization (EUA). This EUA will remain  in effect (meaning this test can be used) for the duration of the COVID-19 declaration under Section 56 4(b)(1) of the Act, 21 U.S.C. section 360bbb-3(b)(1), unless the authorization is terminated or revoked sooner. Performed at [facility], [address] [place], [place] [address]    Urine Culture     Status: None (Preliminary result)   Collection Time: 07/31/19  8:09 AM   Specimen: Urine, Clean Catch  Result Value Ref Range Status   Specimen Description   Final    URINE, CLEAN CATCH Performed at [facility], [address] [place], [place] [address]    Special Requests   Final    Normal Performed at [facility], [address] [place], [place] [address]    Culture   Final    CULTURE REINCUBATED FOR BETTER GROWTH Performed at [facility], [address] [place], [place] [address]    Report Status PENDING  Incomplete     Studies: No results found.  Scheduled Meds: . apixaban  5 mg Oral BID  . diltiazem  180 mg Oral  Daily  . folic acid  1 mg Oral Daily  . insulin aspart  0-5 Units Subcutaneous QHS  . insulin aspart  0-9 Units Subcutaneous TID WC  . insulin aspart  4 Units Subcutaneous TID WC  . insulin glargine  25 Units Subcutaneous BID  . levothyroxine  100 mcg Oral QAC breakfast  . multivitamin with minerals  1 tablet Oral Daily  . nadolol  80 mg Oral Daily  . omega-3 acid ethyl esters  1 capsule Oral BID  . pantoprazole  40 mg Oral Daily  . QUEtiapine  100 mg Oral q morning - 10a   And  . QUEtiapine  200 mg Oral QHS  . thiamine  100 mg Oral Daily   Or  . thiamine  100 mg Intravenous Daily  . traMADol  50 mg Oral BID   Continuous Infusions: . sodium chloride Stopped (07/31/19 1742)  .  ceFAZolin (ANCEF) IV 1 g (08/01/19 0916)    Assessment/Plan:  1. Clinical sepsis present on admission, relative hypotension.  Patient with left first toe infection and infection underneath the toenail.  Cellulitis going up the left leg circumferential left calf.  Initially in the ER given aggressive antibiotics.  Podiatry removed the patient's toenail.  Antibiotics switch over to Ancef yesterday. 2. 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  Hemoglobin A1c elevated at 9.1.  Continue Lantus and sliding scale.  Short  acting insulin prior to meals. 3. Lactic acidosis.  Patient was given IV fluids initially. 4. Chronic atrial fibrillation on Eliquis for anticoagulation.  Cardizem and nadolol for rate control.  Will increase the dose of Cardizem for tomorrow. 5. 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  We will get an abdominal ultrasound to screen for ascites 6. Alcohol abuse.  Patient is on alcohol abuse withdrawal protocol.  Currently no signs of withdrawal. 7. We will get physical therapy evaluation  Code Status:     Code Status Orders  (From admission, onward)         Start     Ordered   07/30/19 1452  Full code  Continuous     07/30/19 1452        Code Status History    Date Active Date Inactive Code Status Order ID Comments User Context   08/30/2018 0257 09/03/2018 1828 Full Code [id number]  Oralia Manis, MD Inpatient   Advance Care Planning Activity     Family Communication: Spoke with wife on the phone Disposition Plan: Evaluate daily on when to go home.  Consultants:  Podiatry  Procedures:  Toenail removal  Antibiotics:  Ancef  Time spent: 27 minutes  [employer]

## 2019-08-01 NOTE — Progress Notes (Signed)
Patient educated on falls protocol - need for bed alarm. Patient refuses bed alarm.

## 2019-08-02 DIAGNOSIS — I482 Chronic atrial fibrillation, unspecified: Secondary | ICD-10-CM

## 2019-08-02 LAB — GLUCOSE, CAPILLARY
Glucose-Capillary: 102 mg/dL — ABNORMAL HIGH (ref 70–99)
Glucose-Capillary: 188 mg/dL — ABNORMAL HIGH (ref 70–99)
Glucose-Capillary: 180 mg/dL — ABNORMAL HIGH (ref 70–99)
Glucose-Capillary: 143 mg/dL — ABNORMAL HIGH (ref 70–99)

## 2019-08-02 LAB — URINE CULTURE
Culture: 20000 — AB
Special Requests: NORMAL

## 2019-08-02 NOTE — Progress Notes (Signed)
Patient ID: Lawrence Osborn, male   DOB: 02/17/1951, 68 y.o.   MRN: [id number] [facility] PROGRESS NOTE  Elvert Cumpton ZJQ:[id number] DOB: [birth year] DOA: 07/30/2019 PCP: Rusty Aus, MD  HPI/Subjective: Patient states the pain in his left leg has definitely gotten less.  The redness has started to settle down.  He feels okay.  Objective: Vitals:   08/02/19 1301 08/02/19 1312  BP: (!) 119/97   Pulse: (!) 115 (!) 108  Resp: 18   Temp: (!) 97.4 F (36.3 C)   SpO2: 97%     Intake/Output Summary (Last 24 hours) at 08/02/2019 1314 Last data filed at 08/02/2019 0849 Gross per 24 hour  Intake 116.75 ml  Output 775 ml  Net -658.25 ml   Filed Weights   07/30/19 1225  Weight: 113.4 kg    ROS: Review of Systems  Constitutional: Negative for chills and fever.  Eyes: Negative for blurred vision.  Respiratory: Negative for cough and shortness of breath.   Cardiovascular: Negative for chest pain.  Gastrointestinal: Negative for abdominal pain, constipation, diarrhea, nausea and vomiting.  Genitourinary: Negative for dysuria.  Musculoskeletal: Positive for joint pain.  Neurological: Negative for dizziness and headaches.   Exam: Physical Exam  Constitutional: He is oriented to person, place, and time.  HENT:  Nose: No mucosal edema.  Mouth/Throat: No oropharyngeal exudate or posterior oropharyngeal edema.  Eyes: Pupils are equal, round, and reactive to light. Conjunctivae, EOM and lids are normal.  Neck: No JVD present. Carotid bruit is not present. No edema present. No thyroid mass and no thyromegaly present.  Cardiovascular: S1 normal and S2 normal. Exam reveals no gallop.  No murmur heard. Pulses:      Dorsalis pedis pulses are 2+ on the right side and 2+ on the left side.  Respiratory: No respiratory distress. He has no wheezes. He has no rhonchi. He has no rales.  GI: Soft. Bowel sounds are normal. He exhibits distension. There is no abdominal tenderness.   Musculoskeletal:     Right ankle: He exhibits no swelling.     Left ankle: He exhibits swelling.  Lymphadenopathy:    He has no cervical adenopathy.  Neurological: He is alert and oriented to person, place, and time. No cranial nerve deficit.  Skin: Skin is warm. Nails show no clubbing.  Left first toe toenail removed.  Redness around the first toe has improved.  Redness and warmth on the left shin is improving  The redness and warmth in the left groin has subsided.  Psychiatric: He has a normal mood and affect.      Data Reviewed: Basic Metabolic Panel: Recent Labs  Lab 07/30/19 1238 07/30/19 1618 07/31/19 0500  NA 134* 134* 138  K 4.1 3.8 4.0  CL 96* 97* 102  CO2 26 27 28   GLUCOSE 253* 246* 130*  BUN 18 19 16   CREATININE 1.06 0.93 0.86  CALCIUM 9.2 8.4* 8.4*  MG  --  2.0  --   PHOS  --  3.4  --    Liver Function Tests: Recent Labs  Lab 07/30/19 1238 07/30/19 1618  AST 48* 43*  ALT 25 21  ALKPHOS 126 113  BILITOT 1.7* 1.5*  PROT 7.4 6.6  ALBUMIN 3.2* 2.8*    Recent Labs  Lab 07/31/19 0500  AMMONIA 31   CBC: Recent Labs  Lab 07/30/19 1238 07/30/19 1618 07/31/19 0500  WBC 7.9 5.7 5.4  NEUTROABS 5.9  --   --   HGB 13.8 12.6* 12.2*  HCT 42.5 39.4 37.4*  MCV 84.8 87.2 84.8  PLT 98* 80* 81*    CBG: Recent Labs  Lab 08/01/19 1139 08/01/19 1728 08/01/19 2136 08/02/19 0744 08/02/19 1129  GLUCAP 148* 131* 138* 102* 188*    Recent Results (from the past 240 hour(s))  Culture, blood (Routine x 2)     Status: None (Preliminary result)   Collection Time: 07/30/19 12:30 PM   Specimen: BLOOD  Result Value Ref Range Status   Specimen Description BLOOD RIGHT ANTECUBITAL  Final   Special Requests   Final    BOTTLES DRAWN AEROBIC AND ANAEROBIC Blood Culture adequate volume   Culture   Final    NO GROWTH 2 DAYS Performed at [facility], [address], [place], [place] [address]    Report Status PENDING  Incomplete  Culture, blood  (Routine x 2)     Status: None (Preliminary result)   Collection Time: 07/30/19 12:36 PM   Specimen: BLOOD  Result Value Ref Range Status   Specimen Description BLOOD BLOOD RIGHT HAND  Final   Special Requests   Final    BOTTLES DRAWN AEROBIC AND ANAEROBIC Blood Culture adequate volume   Culture   Final    NO GROWTH 2 DAYS Performed at [facility], [address], [place], [place] [address]    Report Status PENDING  Incomplete  SARS CORONAVIRUS 2 (TAT 6-24 HRS) Nasopharyngeal Nasopharyngeal Swab     Status: None   Collection Time: 07/30/19  4:44 PM   Specimen: Nasopharyngeal Swab  Result Value Ref Range Status   SARS Coronavirus 2 NEGATIVE NEGATIVE Final    Comment: (NOTE) SARS-CoV-2 target nucleic acids are NOT DETECTED. The SARS-CoV-2 RNA is generally detectable in upper and lower respiratory specimens during the acute phase of infection. Negative results do not preclude SARS-CoV-2 infection, do not rule out co-infections with other pathogens, and should not be used as the sole basis for treatment or other patient management decisions. Negative results must be combined with clinical observations, patient history, and epidemiological information. The expected result is Negative. Fact Sheet for Patients: HairSlick.no Fact Sheet for Healthcare Providers: quierodirigir.com This test is not yet approved or cleared by the [place] FDA and  has been authorized for detection and/or diagnosis of SARS-CoV-2 by FDA under an Emergency Use Authorization (EUA). This EUA will remain  in effect (meaning this test can be used) for the duration of the COVID-19 declaration under Section 56 4(b)(1) of the Act, 21 U.S.C. section 360bbb-3(b)(1), unless the authorization is terminated or revoked sooner. Performed at [facility], [address], [place], [place] [address]   Urine Culture     Status: Abnormal   Collection  Time: 07/31/19  8:09 AM   Specimen: Urine, Clean Catch  Result Value Ref Range Status   Specimen Description   Final    URINE, CLEAN CATCH Performed at [facility], [address] [place], [place] [address]    Special Requests   Final    Normal Performed at [facility], [address] [place], [place] [address]    Culture 20,000 COLONIES/mL YEAST (A)  Final   Report Status 08/02/2019 FINAL  Final     Studies: [place] Ascites (abdomen Limited)  Result Date: 08/01/2019 CLINICAL DATA:  Cirrhosis, workup for ascites. EXAM: LIMITED ABDOMEN ULTRASOUND FOR ASCITES TECHNIQUE: Limited ultrasound survey for ascites was performed in all four abdominal quadrants. COMPARISON:  12/03/2018 FINDINGS: The 4 quadrants of the abdomen or  surveyed, no ascites identified. IMPRESSION: 1. No significant ascites is identified. Electronically Signed   By: Gaylyn RongWalter  Liebkemann M.D.   On: 08/01/2019 18:32    Scheduled Meds: . apixaban  5 mg Oral BID  . diltiazem  240 mg Oral Daily  . folic acid  1 mg Oral Daily  . insulin aspart  0-5 Units Subcutaneous QHS  . insulin aspart  0-9 Units Subcutaneous TID WC  . insulin aspart  4 Units Subcutaneous TID WC  . insulin glargine  25 Units Subcutaneous BID  . levothyroxine  100 mcg Oral QAC breakfast  . multivitamin with minerals  1 tablet Oral Daily  . nadolol  80 mg Oral Daily  . omega-3 acid ethyl esters  1 capsule Oral BID  . pantoprazole  40 mg Oral Daily  . QUEtiapine  100 mg Oral q morning - 10a   And  . QUEtiapine  200 mg Oral QHS  . thiamine  100 mg Oral Daily   Or  . thiamine  100 mg Intravenous Daily  . traMADol  50 mg Oral BID   Continuous Infusions: . sodium chloride Stopped (07/31/19 1742)  .  ceFAZolin (ANCEF) IV 1 g (08/02/19 1201)    Assessment/Plan:  1. Clinical sepsis present on admission, relative hypotension.  Patient with left first toe infection and infection underneath the toenail.  Cellulitis going up the left  leg circumferential left calf.  Initially in the ER given aggressive antibiotics.  Podiatry removed the patient's toenail.  Continue IV Ancef. 2. 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  Hemoglobin A1c elevated at 9.1.  Continue Lantus and sliding scale.  Short acting insulin prior to meals. 3. Lactic acidosis.  Patient was given IV fluids initially. 4. Chronic atrial fibrillation on Eliquis for anticoagulation.  Cardizem and nadolol for rate control.  5. 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  We will get an abdominal ultrasound to screen for ascites 6. Alcohol abuse.  Patient is on alcohol abuse withdrawal protocol.  Currently no signs of withdrawal.   Code Status:     Code Status Orders  (From admission, onward)         Start     Ordered   07/30/19 1452  Full code  Continuous     07/30/19 1452        Code Status History    Date Active Date Inactive Code Status Order ID Comments User Context   08/30/2018 0257 09/03/2018 1828 Full Code [id number]  Oralia ManisWillis, David, MD Inpatient   Advance Care Planning Activity     Family Communication: Spoke with wife on the phone Disposition Plan: Hopefully will be ready to go tomorrow after another day of IV antibiotics.  Consultants:  Podiatry  Procedures:  Toenail removal  Antibiotics:  Ancef  Time spent: 28 minutes  Fotini Lemus [employer]  [facility]

## 2019-08-02 NOTE — Progress Notes (Signed)
Patient educated on High Risk Fall protocol. Refuses bed alarm.

## 2019-08-02 NOTE — Progress Notes (Signed)
Pt instructed on bed alarm and safety, refuses bed alarm

## 2019-08-03 LAB — CREATININE, SERUM
Creatinine, Ser: 0.66 mg/dL (ref 0.61–1.24)
GFR calc Af Amer: >60 mL/min (ref >60)
GFR calc non Af Amer: >60 mL/min (ref >60)

## 2019-08-03 LAB — CBC
HCT: 38.8 % — ABNORMAL LOW (ref 39.0–52.0)
Hemoglobin: 12.4 g/dL — ABNORMAL LOW (ref 13.0–17.0)
MCH: 27.5 pg (ref 26.0–34.0)
MCHC: 32 g/dL (ref 30.0–36.0)
MCV: 86 fL (ref 80.0–100.0)
Platelets: 82 10*3/uL — ABNORMAL LOW (ref 150–400)
RBC: 4.51 MIL/uL (ref 4.22–5.81)
RDW: 16.3 % — ABNORMAL HIGH (ref 11.5–15.5)
WBC: 4.2 10*3/uL (ref 4.0–10.5)
nRBC: 0 % (ref 0.0–0.2)

## 2019-08-03 LAB — GLUCOSE, CAPILLARY: Glucose-Capillary: 107 mg/dL — ABNORMAL HIGH (ref 70–99)

## 2019-08-03 MED ORDER — APIXABAN 5 MG PO TABS: 5.0000 mg | ORAL_TABLET | Freq: Two times a day (BID) | ORAL | Status: DC

## 2019-08-03 MED ORDER — THIAMINE HCL 100 MG PO TABS: 100.0000 mg | ORAL_TABLET | Freq: Every day | ORAL | 0 refills | Status: DC

## 2019-08-03 MED ORDER — NADOLOL 40 MG PO TABS: 80.0000 mg | ORAL_TABLET | Freq: Every day | ORAL | Status: DC

## 2019-08-03 MED ORDER — DILTIAZEM HCL ER COATED BEADS 240 MG PO CP24: 240.0000 mg | ORAL_CAPSULE | Freq: Every day | ORAL | 0 refills | Status: DC

## 2019-08-03 MED ORDER — CEPHALEXIN 500 MG PO CAPS: 500.0000 mg | ORAL_CAPSULE | Freq: Four times a day (QID) | ORAL | 0 refills | Status: AC

## 2019-08-03 NOTE — Progress Notes (Signed)
Alden Hipp to be D/C'd Home per MD order.  Discussed prescriptions and follow up appointments with the patient. Prescriptions given to patient, medication list explained in detail. Pt verbalized understanding.  Allergies as of 08/03/2019   No Known Allergies     Medication List    STOP taking these medications   digoxin 0.125 MG tablet Commonly known as: LANOXIN   Trulicity 3.78 HY/8.5OY Sopn Generic drug: Dulaglutide     TAKE these medications   albuterol 108 (90 Base) MCG/ACT inhaler Commonly known as: VENTOLIN HFA Inhale 2 puffs into the lungs every 6 (six) hours as needed for wheezing or shortness of breath.   apixaban 5 MG Tabs tablet Commonly known as: ELIQUIS Take 1 tablet (5 mg total) by mouth 2 (two) times daily. What changed:   how much to take  how to take this  when to take this  additional instructions   cephALEXin 500 MG capsule Commonly known as: KEFLEX Take 1 capsule (500 mg total) by mouth 4 (four) times daily for 6 days.   diltiazem 240 MG 24 hr capsule Commonly known as: CARDIZEM CD Take 1 capsule (240 mg total) by mouth daily. What changed:   medication strength  how much to take  when to take this   Lantus SoloStar 100 UNIT/ML Solostar Pen Generic drug: Insulin Glargine Inject 25 Units into the skin 2 (two) times daily.   levothyroxine 100 MCG tablet Commonly known as: SYNTHROID Take 100 mcg by mouth daily before breakfast.   nadolol 40 MG tablet Commonly known as: CORGARD Take 2 tablets (80 mg total) by mouth daily. What changed: when to take this   omega-3 fish oil 1000 MG Caps capsule Commonly known as: MAXEPA Take 1 capsule by mouth 2 (two) times daily.   omeprazole 20 MG capsule Commonly known as: PRILOSEC Take 20 mg by mouth daily.   QUEtiapine 100 MG tablet Commonly known as: SEROQUEL Take 100-200 mg by mouth 2 (two) times daily. 100 mg in the morning and 200 mg at bedtime   thiamine 100 MG tablet Take 1 tablet  (100 mg total) by mouth daily.   traMADol 50 MG tablet Commonly known as: ULTRAM Take 50 mg by mouth 2 (two) times daily.       Vitals:   08/03/19 0606 08/03/19 0937  BP: 110/78 96/83  Pulse: 94 (!) 120  Resp: 16   Temp: 98.5 F (36.9 C)   SpO2: 94% 98%    Skin clean, dry and intact without evidence of skin break down, no evidence of skin tears noted. IV catheter discontinued intact. Site without signs and symptoms of complications. Dressing and pressure applied. Pt denies pain at this time. No complaints noted.  An After Visit Summary was printed and given to the patient. Patient escorted via [place], and D/C home via private auto.  Fuller Mandril, RN

## 2019-08-03 NOTE — Progress Notes (Signed)
HR ranging from 113 to 120. Scheduled BP medications given. Patient is asymptomatic. MD aware.   Fuller Mandril, RN

## 2019-08-03 NOTE — Care Management Important Message (Signed)
Important Message  Patient Details  Name: Lawrence Osborn MRN: [id number] Date of Birth: 21-Oct-1950   Medicare Important Message Given:  Yes     [name] 08/03/2019, 12:17 PM

## 2019-08-03 NOTE — Discharge Summary (Signed)
[facility] [place] [facility]   PATIENT NAME: Lawrence Osborn    MR#:  [id number]  DATE OF BIRTH:  05-03-1951  DATE OF ADMISSION:  07/30/2019 ADMITTING PHYSICIAN: Alford Highland, MD  DATE OF DISCHARGE: 08/03/2019 12:14 PM  PRIMARY CARE PHYSICIAN: Danella Penton, MD    ADMISSION DIAGNOSIS:  Cellulitis of left lower extremity [L03.116] Toe infection [L08.9] Elevated lactic acid level [R79.89]  DISCHARGE DIAGNOSIS:  Active Problems:   Type 2 diabetes, uncontrolled, with neuropathy (HCC)   Hypotension   Sepsis (HCC)   Toe infection   Alcoholic cirrhosis of liver without ascites (HCC)   Atrial fibrillation, chronic (HCC)   SECONDARY DIAGNOSIS:   Past Medical History:  Diagnosis Date  . COPD (chronic obstructive pulmonary disease) (HCC)   . Diabetes mellitus without complication (HCC)   . GI bleed   . Pneumothorax     HOSPITAL COURSE:   1.  Clinical sepsis, present on admission with relative hypotension.  Patient had left first toe infection and infection underneath the toenail.  Patient had a cellulitis going up the left leg circumferential on the left calf.  In the ER given aggressive antibiotics and then switched over to IV Ancef.  Podiatry removed his left first toenail.  Can keep this covered.  Will prescribe Keflex upon going home to complete a course. 2.  Type 2 diabetes mellitus with hyperglycemia and neuropathy.  Hemoglobin A1c elevated at 9.1.  The patient was continued on his Lantus and sliding scale.  The patient states that he could not afford Trulicity as outpatient.  He will have to follow-up with his medical doctor for this. 3.  Lactic acidosis.  Patient was given IV fluids initially. 4.  Chronic atrial fibrillation on Eliquis for anticoagulation.  Cardizem dose increased and patient is on nadolol for rate control.  Patient states his heart rate is usually around 100-110. 5.  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  Abdominal  ultrasound did not show any ascites. 6.  Alcohol abuse.  Patient did not show any signs of withdrawal during the hospital course. 7.  Morbid obesity with a BMI of 40.35.  Weight loss needed  DISCHARGE CONDITIONS:   Satisfactory  CONSULTS OBTAINED:  Podiatry  DRUG ALLERGIES:  No Known Allergies  DISCHARGE MEDICATIONS:   Allergies as of 08/03/2019   No Known Allergies     Medication List    STOP taking these medications   digoxin 0.125 MG tablet Commonly known as: LANOXIN   Trulicity 0.75 MG/0.5ML Sopn Generic drug: Dulaglutide     TAKE these medications   albuterol 108 (90 Base) MCG/ACT inhaler Commonly known as: VENTOLIN HFA Inhale 2 puffs into the lungs every 6 (six) hours as needed for wheezing or shortness of breath.   apixaban 5 MG Tabs tablet Commonly known as: ELIQUIS Take 1 tablet (5 mg total) by mouth 2 (two) times daily. What changed:   how much to take  how to take this  when to take this  additional instructions   cephALEXin 500 MG capsule Commonly known as: KEFLEX Take 1 capsule (500 mg total) by mouth 4 (four) times daily for 6 days.   diltiazem 240 MG 24 hr capsule Commonly known as: CARDIZEM CD Take 1 capsule (240 mg total) by mouth daily. What changed:   medication strength  how much to take  when to take this   Lantus SoloStar 100 UNIT/ML Solostar Pen Generic drug: Insulin Glargine Inject 25 Units into the skin  2 (two) times daily.   levothyroxine 100 MCG tablet Commonly known as: SYNTHROID Take 100 mcg by mouth daily before breakfast.   nadolol 40 MG tablet Commonly known as: CORGARD Take 2 tablets (80 mg total) by mouth daily. What changed: when to take this   omega-3 fish oil 1000 MG Caps capsule Commonly known as: MAXEPA Take 1 capsule by mouth 2 (two) times daily.   omeprazole 20 MG capsule Commonly known as: PRILOSEC Take 20 mg by mouth daily.   QUEtiapine 100 MG tablet Commonly known as: SEROQUEL Take  100-200 mg by mouth 2 (two) times daily. 100 mg in the morning and 200 mg at bedtime   thiamine 100 MG tablet Take 1 tablet (100 mg total) by mouth daily.   traMADol 50 MG tablet Commonly known as: ULTRAM Take 50 mg by mouth 2 (two) times daily.        DISCHARGE INSTRUCTIONS:   Follow-up PMD 5 days  If you experience worsening of your admission symptoms, develop shortness of breath, life threatening emergency, suicidal or homicidal thoughts you must seek medical attention immediately by calling 911 or calling your MD immediately  if symptoms less severe.  You Must read complete instructions/literature along with all the possible adverse reactions/side effects for all the Medicines you take and that have been prescribed to you. Take any new Medicines after you have completely understood and accept all the possible adverse reactions/side effects.   Please note  You were cared for by a hospitalist during your hospital stay. If you have any questions about your discharge medications or the care you received while you were in the hospital after you are discharged, you can call the unit and asked to speak with the hospitalist on call if the hospitalist that took care of you is not available. Once you are discharged, your primary care physician will handle any further medical issues. Please note that NO REFILLS for any discharge medications will be authorized once you are discharged, as it is imperative that you return to your primary care physician (or establish a relationship with a primary care physician if you do not have one) for your aftercare needs so that they can reassess your need for medications and monitor your lab values.    Today   CHIEF COMPLAINT:   Chief Complaint  Patient presents with  . Wound Infection    HISTORY OF PRESENT ILLNESS:  Lawrence Osborn  is a 68 y.o. male came in with a left toe infection   VITAL SIGNS:  Blood pressure 96/83, pulse (!) 120, temperature  98.5 F (36.9 C), temperature source Oral, resp. rate 16, height  (1.676 m), weight 113.4 kg, SpO2 98 %. Heart rate this morning 94  PHYSICAL EXAMINATION:  GENERAL:  68 y.o.-year-old patient lying in the bed with no acute distress.  EYES: Pupils equal, round, reactive to light and accommodation. No scleral icterus. Extraocular muscles intact.  HEENT: Head atraumatic, normocephalic. Oropharynx and nasopharynx clear.  NECK:  Supple, no jugular venous distention. No thyroid enlargement, no tenderness.  LUNGS: Normal breath sounds bilaterally, no wheezing, rales,rhonchi or crepitation. No use of accessory muscles of respiration.  CARDIOVASCULAR: S1, S2 irregularly irregular no murmurs, rubs, or gallops.  ABDOMEN: Soft, non-tender, non-distended. Bowel sounds present. No organomegaly or mass.  EXTREMITIES: 2+ pedal edema, no cyanosis, or clubbing.  NEUROLOGIC: Cranial nerves II through XII are intact. Muscle strength 5/5 in all extremities. Sensation intact. Gait not checked.  PSYCHIATRIC: The patient is alert  and oriented x 3.  SKIN: Left lower extremity fading cellulitis left calf.  No cellulitis seen in the left groin or left first toe  DATA REVIEW:   CBC Recent Labs  Lab 08/03/19 0537  WBC 4.2  HGB 12.4*  HCT 38.8*  PLT 82*    Chemistries  Recent Labs  Lab 07/30/19 1618 07/31/19 0500 08/03/19 0537  NA 134* 138  --   K 3.8 4.0  --   CL 97* 102  --   CO2 27 28  --   GLUCOSE 246* 130*  --   BUN 19 16  --   CREATININE 0.93 0.86 0.66  CALCIUM 8.4* 8.4*  --   MG 2.0  --   --   AST 43*  --   --   ALT 21  --   --   ALKPHOS 113  --   --   BILITOT 1.5*  --   --     Microbiology Results  Results for orders placed or performed during the hospital encounter of 07/30/19  Culture, blood (Routine x 2)     Status: None (Preliminary result)   Collection Time: 07/30/19 12:30 PM   Specimen: BLOOD  Result Value Ref Range Status   Specimen Description BLOOD RIGHT ANTECUBITAL   Final   Special Requests   Final    BOTTLES DRAWN AEROBIC AND ANAEROBIC Blood Culture adequate volume   Culture   Final    NO GROWTH 4 DAYS Performed at [facility], [address] [place], [place] [address]    Report Status PENDING  Incomplete  Culture, blood (Routine x 2)     Status: None (Preliminary result)   Collection Time: 07/30/19 12:36 PM   Specimen: BLOOD  Result Value Ref Range Status   Specimen Description BLOOD BLOOD RIGHT HAND  Final   Special Requests   Final    BOTTLES DRAWN AEROBIC AND ANAEROBIC Blood Culture adequate volume   Culture   Final    NO GROWTH 4 DAYS Performed at [facility], [address] [place], [place] [address]    Report Status PENDING  Incomplete  SARS CORONAVIRUS 2 (TAT 6-24 HRS) Nasopharyngeal Nasopharyngeal Swab     Status: None   Collection Time: 07/30/19  4:44 PM   Specimen: Nasopharyngeal Swab  Result Value Ref Range Status   SARS Coronavirus 2 NEGATIVE NEGATIVE Final    Comment: (NOTE) SARS-CoV-2 target nucleic acids are NOT DETECTED. The SARS-CoV-2 RNA is generally detectable in upper and lower respiratory specimens during the acute phase of infection. Negative results do not preclude SARS-CoV-2 infection, do not rule out co-infections with other pathogens, and should not be used as the sole basis for treatment or other patient management decisions. Negative results must be combined with clinical observations, patient history, and epidemiological information. The expected result is Negative. Fact Sheet for Patients: SugarRoll.be Fact Sheet for Healthcare Providers: https://www.woods-mathews.com/ This test is not yet approved or cleared by the [place] FDA and  has been authorized for detection and/or diagnosis of SARS-CoV-2 by FDA under an Emergency Use Authorization (EUA). This EUA will remain  in effect (meaning this test can be used) for the duration of  the COVID-19 declaration under Section 56 4(b)(1) of the Act, 21 U.S.C. section 360bbb-3(b)(1), unless the authorization is terminated or revoked sooner. Performed at [facility], [place] [address] [place], [place] [address]   Urine Culture     Status: Abnormal   Collection Time: 07/31/19  8:09 AM   Specimen: Urine, Clean Catch  Result Value Ref Range Status   Specimen Description   Final    URINE, CLEAN CATCH Performed at [facility], [address] [place], [place] [address]    Special Requests   Final    Normal Performed at [facility], [address] [place], [place] [address]    Culture 20,000 COLONIES/mL YEAST (A)  Final   Report Status 08/02/2019 FINAL  Final    RADIOLOGY:  [place] Ascites (abdomen Limited)  Result Date: 08/01/2019 CLINICAL DATA:  Cirrhosis, workup for ascites. EXAM: LIMITED ABDOMEN ULTRASOUND FOR ASCITES TECHNIQUE: Limited ultrasound survey for ascites was performed in all four abdominal quadrants. COMPARISON:  12/03/2018 FINDINGS: The 4 quadrants of the abdomen or surveyed, no ascites identified. IMPRESSION: 1. No significant ascites is identified. Electronically Signed   By: Gaylyn RongWalter  Liebkemann M.D.   On: 08/01/2019 18:32     Management plans discussed with the patient, family and they are in agreement.  CODE STATUS:  Code Status History    Date Active Date Inactive Code Status Order ID Comments User Context   07/30/2019 1452 08/03/2019 1514 Full Code [id number]  Alford HighlandWieting, Shekela Goodridge, MD ED   08/30/2018 0257 09/03/2018 1828 Full Code [id number]  Oralia ManisWillis, David, MD Inpatient   Advance Care Planning Activity      TOTAL TIME TAKING CARE OF THIS PATIENT: 34 minutes.    Alford Highlandichard Santana Gosdin M.D on 08/03/2019 at 4:09 PM  Between 7am to 6pm - Pager - [phone]  After 6pm go to [email or web address] - password [email or web address]  [facility]  CC: Primary care physician; Danella PentonMiller, Mark F, MD

## 2019-08-03 NOTE — Consult Note (Signed)
Pharmacy Antibiotic Note  Lawrence Osborn is a 68 y.o. male admitted on 07/30/2019 with cellulitis.  PMH significant for diabetes, COPD, atrial fibrillation, alcoholic cirrhosis admitted on 07/30/2019 with wound infection. Patient reports recent injury to great toes on both feet several days ago. One injury has healed while the other has worsened with erythema and edema traveling up the leg. Per MD, there is purulence under the toenail. Podiatry consulted to remove the toenail. The toenail removed and no noted purulence. 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  Pharmacy has been consulted for Cefazolin dosing.  He received one time doses of ampicillin/sulbactam 3 g and vancomycin 1 g in the ED.   Plan: Day 5 of abx. Continue cefazolin 1g Q8H  Height: 5\' 6"  (167.6 cm) Weight: 250 lb (113.4 kg) IBW/kg (Calculated) : 63.8  Temp (24hrs), Avg:98.2 F (36.8 C), Min:97.4 F (36.3 C), Max:98.7 F (37.1 C)  Recent Labs  Lab 07/30/19 1238 07/30/19 1618 07/30/19 1644 07/31/19 0500 08/03/19 0537  WBC 7.9 5.7  --  5.4 4.2  CREATININE 1.06 0.93  --  0.86 0.66  LATICACIDVEN 2.3*  --  1.6  --   --     Estimated Creatinine Clearance: 107.4 mL/min (by C-G formula based on SCr of 0.66 mg/dL).    No Known Allergies  Antimicrobials this admission: 11/12 Vancomycin > 11/12 11/12 Unasyn >> 11/13 11/13 Cefazolin >>   Dose adjustments this admission: N/A  Microbiology results: 11/12 BCx: NGTD 11/13 UCx: 20,000 COLONIES/mL YEAST  Thank you for allowing pharmacy to be a part of this patient's care.  Oswald Hillock, PharmD, BCPS 08/03/2019 9:55 AM

## 2019-08-04 LAB — CULTURE, BLOOD (ROUTINE X 2)
Culture: NO GROWTH
Culture: NO GROWTH
Special Requests: ADEQUATE
Special Requests: ADEQUATE

## 2019-11-19 ENCOUNTER — Other Ambulatory Visit: Payer: Self-pay | Admitting: Internal Medicine

## 2019-11-19 DIAGNOSIS — R319 Hematuria, unspecified: Secondary | ICD-10-CM

## 2019-11-19 DIAGNOSIS — R1031 Right lower quadrant pain: Secondary | ICD-10-CM

## 2019-11-19 DIAGNOSIS — R1032 Left lower quadrant pain: Secondary | ICD-10-CM

## 2019-11-27 ENCOUNTER — Ambulatory Visit
Admission: RE | Admit: 2019-11-27 | Discharge: 2019-11-27 | Disposition: A | Discharge status: Home / Self Care | Payer: Medicare Other | Source: Ambulatory Visit | Attending: Internal Medicine | Admitting: Internal Medicine

## 2019-11-27 ENCOUNTER — Other Ambulatory Visit: Payer: Self-pay

## 2019-11-27 DIAGNOSIS — R1031 Right lower quadrant pain: Secondary | ICD-10-CM | POA: Diagnosis present

## 2019-11-27 DIAGNOSIS — R319 Hematuria, unspecified: Secondary | ICD-10-CM | POA: Insufficient documentation

## 2019-11-27 DIAGNOSIS — R1032 Left lower quadrant pain: Secondary | ICD-10-CM | POA: Diagnosis present

## 2019-11-27 HISTORY — DX: Essential (primary) hypertension: I10

## 2019-11-27 LAB — POCT I-STAT CREATININE: Creatinine, Ser: 0.7 mg/dL (ref 0.61–1.24)

## 2019-11-27 MED ORDER — IOHEXOL 300 MG/ML  SOLN
125.0000 mL | Freq: Once | INTRAMUSCULAR | Status: AC | PRN
  Administered 2019-11-27: 125 mL via INTRAVENOUS

## 2019-12-17 ENCOUNTER — Ambulatory Visit (INDEPENDENT_AMBULATORY_CARE_PROVIDER_SITE_OTHER): Payer: Medicare Other | Admitting: Urology

## 2019-12-17 ENCOUNTER — Other Ambulatory Visit: Payer: Self-pay

## 2019-12-17 ENCOUNTER — Encounter: Payer: Self-pay | Admitting: Urology

## 2019-12-17 VITALS — BP 136/87 | HR 114 | Ht 68.0 in | Wt 245.0 lb

## 2019-12-17 DIAGNOSIS — R310 Gross hematuria: Secondary | ICD-10-CM

## 2019-12-17 DIAGNOSIS — R319 Hematuria, unspecified: Secondary | ICD-10-CM | POA: Diagnosis not present

## 2019-12-17 DIAGNOSIS — N481 Balanitis: Secondary | ICD-10-CM

## 2019-12-17 MED ORDER — NYSTATIN-TRIAMCINOLONE 100000-0.1 UNIT/GM-% EX OINT: 1.0000 "application " | TOPICAL_OINTMENT | Freq: Two times a day (BID) | CUTANEOUS | 0 refills | Status: DC

## 2019-12-17 NOTE — Patient Instructions (Signed)
Balanitis  Balanitis is swelling and irritation (inflammation) of the head of the penis (glans penis). The condition may also cause inflammation of the skin around the glans penis (foreskin) in men who have not been circumcised. It may develop because of an infection or another medical condition. Balanitis occurs most often among men who have not had their foreskin removed (uncircumcised men). Balanitis sometimes causes scarring of the penis or foreskin, which can require surgery. Untreated balanitis can increase the risk of penile cancer. What are the causes? Common causes of this condition include:  Poor personal hygiene, especially in uncircumcised men. Not cleaning the glans penis and foreskin well can result in buildup of bacteria, viruses, and yeast, which can lead to infection and inflammation.  Irritation and lack of air flow due to fluid (smegma) that can build up on the glans penis. Other causes include:  Chemical irritation from products such as soaps or shower gels (especially those that have fragrance), condoms, personal lubricants, petroleum jelly, spermicides, or fabric softeners.  Skin conditions, such as eczema, dermatitis, and psoriasis.  Allergies to medicines, such as tetracycline and sulfa drugs.  Certain medical conditions, including liver cirrhosis, congestive heart failure, diabetes, and kidney disease.  Infections, such as candidiasis, HPV (human papillomavirus), herpes simplex, gonorrhea, and syphilis.  Severe obesity. What increases the risk? The following factors may make you more likely to develop this condition:  Having diabetes. This is the most common risk factor.  Having a tight foreskin that is difficult to pull back (retract) past the glans.  Having sexual intercourse without using a condom. What are the signs or symptoms? Symptoms of this condition include:  Discharge from under the foreskin.  A bad smell.  Pain or difficulty retracting the  foreskin.  Tenderness, redness, and swelling of the glans.  A rash or sores on the glans or foreskin.  Itchiness.  Inability to get an erection due to pain.  Difficulty urinating.  Scarring of the penis or foreskin, in some cases. How is this diagnosed? This condition may be diagnosed based on:  A physical exam.  Testing a swab of discharge to check for bacterial or fungal infection.  Blood tests: ? To check for viruses that can cause balanitis. ? To check your blood sugar (glucose) level. High blood glucose could be a sign of diabetes, which can cause balanitis. How is this treated? Treatment for balanitis depends on the cause. Treatment may include:  Improving personal hygiene. Your health care provider may recommend sitting in a bath of warm water that is deep enough to cover your hips and buttocks (sitz bath).  Medicines such as: ? Creams or ointments to reduce swelling (steroids) or to treat an infection. ? Antibiotic medicine. ? Antifungal medicine.  Surgery to remove or cut the foreskin (circumcision). This may be done if you have scarring on the foreskin that makes it difficult to retract.  Controlling other medical problems that may be causing your condition or making it worse. Follow these instructions at home:  Do not have sex until the condition clears up, or until your health care provider approves.  Keep your penis clean and dry. Take sitz baths as recommended by your health care provider.  Avoid products that irritate your skin or make symptoms worse, such as soaps and shower gels that have fragrance.  Take over-the-counter and prescription medicines only as told by your health care provider. ? If you were prescribed an antibiotic medicine or a cream or ointment, use it as   told by your health care provider. Do not stop using your medicine, cream, or ointment even if you start to feel better. ? Do not drive or use heavy machinery while taking prescription  pain medicine. Contact a health care provider if:  Your symptoms get worse or do not improve with home care.  You develop chills or a fever.  You have trouble urinating.  You cannot retract your foreskin. Get help right away if:  You develop severe pain.  You are unable to urinate. Summary  Balanitis is inflammation of the head of the penis (glans penis) caused by irritation or infection.  Balanitis causes pain, redness, and swelling of the glans penis.  This condition is most common among uncircumcised men who do not keep their glans penis clean and in men who have diabetes.  Treatment may include creams or ointments.  Good hygiene is important for prevention. This includes pulling back the foreskin when washing your penis. This information is not intended to replace advice given to you by your health care provider. Make sure you discuss any questions you have with your health care provider. Document Revised: 08/16/2017 Document Reviewed: 07/23/2016 Elsevier Patient Education  2020 [employer]. Cystoscopy Cystoscopy is a procedure that is used to help diagnose and sometimes treat conditions that affect the lower urinary tract. The lower urinary tract includes the bladder and the urethra. The urethra is the tube that drains urine from the bladder. Cystoscopy is done using a thin, tube-shaped instrument with a light and camera at the end (cystoscope). The cystoscope may be hard or flexible, depending on the goal of the procedure. The cystoscope is inserted through the urethra, into the bladder. Cystoscopy may be recommended if you have:  Urinary tract infections that keep coming back.  Blood in the urine (hematuria).  An inability to control when you urinate (urinary incontinence) or an overactive bladder.  Unusual cells found in a urine sample.  A blockage in the urethra, such as a urinary stone.  Painful urination.  An abnormality in the bladder found during an  intravenous pyelogram (IVP) or CT scan. Cystoscopy may also be done to remove a sample of tissue to be examined under a microscope (biopsy). Tell a health care provider about:  Any allergies you have.  All medicines you are taking, including vitamins, herbs, eye drops, creams, and over-the-counter medicines.  Any problems you or family members have had with anesthetic medicines.  Any blood disorders you have.  Any surgeries you have had.  Any medical conditions you have.  Whether you are pregnant or may be pregnant. What are the risks? Generally, this is a safe procedure. However, problems may occur, including:  Infection.  Bleeding.  Allergic reactions to medicines.  Damage to other structures or organs. What happens before the procedure?  Ask your health care provider about: ? Changing or stopping your regular medicines. This is especially important if you are taking diabetes medicines or blood thinners. ? Taking medicines such as aspirin and ibuprofen. These medicines can thin your blood. Do not take these medicines unless your health care provider tells you to take them. ? Taking over-the-counter medicines, vitamins, herbs, and supplements.  Follow instructions from your health care provider about eating or drinking restrictions.  Ask your health care provider what steps will be taken to help prevent infection. These may include: ? Washing skin with a germ-killing soap. ? Taking antibiotic medicine.  You may have an exam or testing, such as: ? X-rays of  the bladder, urethra, or kidneys. ? Urine tests to check for signs of infection.  Plan to have someone take you home from the hospital or clinic. What happens during the procedure?   You will be given one or more of the following: ? A medicine to help you relax (sedative). ? A medicine to numb the area (local anesthetic).  The area around the opening of your urethra will be cleaned.  The cystoscope will be  passed through your urethra into your bladder.  Germ-free (sterile) fluid will flow through the cystoscope to fill your bladder. The fluid will stretch your bladder so that your health care provider can clearly examine your bladder walls.  Your doctor will look at the urethra and bladder. Your doctor may take a biopsy or remove stones.  The cystoscope will be removed, and your bladder will be emptied. The procedure may vary among health care providers and hospitals. What can I expect after the procedure? After the procedure, it is common to have:  Some soreness or pain in your abdomen and urethra.  Urinary symptoms. These include: ? Mild pain or burning when you urinate. Pain should stop within a few minutes after you urinate. This may last for up to 1 week. ? A small amount of blood in your urine for several days. ? Feeling like you need to urinate but producing only a small amount of urine. Follow these instructions at home: Medicines  Take over-the-counter and prescription medicines only as told by your health care provider.  If you were prescribed an antibiotic medicine, take it as told by your health care provider. Do not stop taking the antibiotic even if you start to feel better. General instructions  Return to your normal activities as told by your health care provider. Ask your health care provider what activities are safe for you.  Do not drive for 24 hours if you were given a sedative during your procedure.  Watch for any blood in your urine. If the amount of blood in your urine increases, call your health care provider.  Follow instructions from your health care provider about eating or drinking restrictions.  If a tissue sample was removed for testing (biopsy) during your procedure, it is up to you to get your test results. Ask your health care provider, or the department that is doing the test, when your results will be ready.  Drink enough fluid to keep your urine  pale yellow.  Keep all follow-up visits as told by your health care provider. This is important. Contact a health care provider if you:  Have pain that gets worse or does not get better with medicine, especially pain when you urinate.  Have trouble urinating.  Have more blood in your urine. Get help right away if you:  Have blood clots in your urine.  Have abdominal pain.  Have a fever or chills.  Are unable to urinate. Summary  Cystoscopy is a procedure that is used to help diagnose and sometimes treat conditions that affect the lower urinary tract.  Cystoscopy is done using a thin, tube-shaped instrument with a light and camera at the end.  After the procedure, it is common to have some soreness or pain in your abdomen and urethra.  Watch for any blood in your urine. If the amount of blood in your urine increases, call your health care provider.  If you were prescribed an antibiotic medicine, take it as told by your health care provider. Do not stop taking  the antibiotic even if you start to feel better. This information is not intended to replace advice given to you by your health care provider. Make sure you discuss any questions you have with your health care provider. Document Revised: 08/26/2018 Document Reviewed: 08/26/2018 Elsevier Patient Education  [place].

## 2019-12-17 NOTE — Progress Notes (Signed)
   12/17/19 4:21 PM   Lawrence Osborn 02-Jan-1951 [id number]  CC: Gross hematuria  HPI: I saw [name] in urology clinic today for evaluation of gross hematuria.  He is a 69 year old male with a number of comorbidities including COPD, diabetes, atrial fibrillation on Eliquis, and alcoholic cirrhosis of the liver who reports intermittent gross hematuria and passage of tissue in the urine since December 2020.  He is also had multiple urine samples since November 2020 with microscopic hematuria.  Urine cultures on 09/25/2019 and 11/18/2019 showed only mixed flora.  He denies any flank pain or weight loss.He has a 40-pack-year smoking history but denies any other carcinogenic exposures.  He quit smoking 10 years ago.  He denies any dysuria or urinary symptoms.  He already had a CT urogram performed on 11/27/2019 that showed no definite findings to explain his hematuria.  Urinalysis today with 6-10 WBCs, 11-30 RBCs, many bacteria, no yeast, 1+ leukocytes.  Will send for culture.   PMH: Past Medical History:  Diagnosis Date  . COPD (chronic obstructive pulmonary disease) (HCC)   . Diabetes mellitus without complication (HCC)   . GI bleed   . Hypertension   . Pneumothorax     Surgical History: Past Surgical History:  Procedure Laterality Date  . APPENDECTOMY    . HERNIA REPAIR     Family History: Family History  Problem Relation Age of Onset  . Hypothyroidism Mother   . CAD Father     Physical Exam: BP 136/87   Pulse (!) 114   Ht 5\' 8"  (1.727 m)   Wt 245 lb (111.1 kg)   BMI 37.25 kg/m    Constitutional:  Alert and oriented, No acute distress. Cardiovascular: No clubbing, cyanosis, or edema. Respiratory: Normal respiratory effort, no increased work of breathing. GI: Abdomen is soft, nontender, nondistended, no abdominal masses GU: Uncircumcised phallus with phimosis, edema, erythema, and some purulence at the opening.  Unable to visualize the meatus or glans.   Laboratory Data:  Reviewed, see HPI  Pertinent Imaging: I have personally reviewed the CT urogram, see HPI  Assessment & Plan:   In summary, he is a co-morbid 69 year old male with intermittent gross hematuria and passage of pink tissue in the urine since December 2020.  No obvious abnormalities on CT urogram.  On physical exam he does have significant balanitis.  We discussed common possible etiologies of hematuria including BPH, malignancy, urolithiasis, medical renal disease, and idiopathic. Standard workup recommended by the AUA includes imaging with CT urogram to assess the upper tracts, and cystoscopy. Cytology is performed on patient's with gross hematuria to look for malignant cells in the urine.  -Mycolog cream twice daily x2 weeks for balanitis -Follow-up in 3 weeks for cystoscopy to complete hematuria work-up  January 2021, MD 12/17/2019  [facility] [address], [address] [place], [place] [phone]

## 2019-12-18 LAB — URINALYSIS, COMPLETE
Bilirubin, UA: NEGATIVE
Ketones, UA: NEGATIVE
Nitrite, UA: NEGATIVE
Protein,UA: NEGATIVE
Specific Gravity, UA: 1.01 (ref 1.005–1.030)
Urobilinogen, Ur: 1 mg/dL (ref 0.2–1.0)
pH, UA: 5.5 (ref 5.0–7.5)

## 2019-12-18 LAB — MICROSCOPIC EXAMINATION

## 2019-12-21 LAB — CULTURE, URINE COMPREHENSIVE

## 2019-12-22 ENCOUNTER — Telehealth: Payer: Self-pay | Admitting: Urology

## 2019-12-22 NOTE — Telephone Encounter (Signed)
Patient's wife said that we gave him a cream for an infection and they are waiting on a PA do be done before they can get it. Have you seen one? She did not know what the name of the medication was?  Lawrence Osborn

## 2019-12-24 ENCOUNTER — Other Ambulatory Visit: Payer: Self-pay | Admitting: Urology

## 2019-12-28 ENCOUNTER — Telehealth: Payer: Self-pay | Admitting: Family Medicine

## 2019-12-28 NOTE — Telephone Encounter (Signed)
PA form was filled out and faxed. Waiting on response.

## 2020-01-07 ENCOUNTER — Encounter: Payer: Self-pay | Admitting: Urology

## 2020-01-07 ENCOUNTER — Ambulatory Visit (INDEPENDENT_AMBULATORY_CARE_PROVIDER_SITE_OTHER): Payer: Medicare Other | Admitting: Urology

## 2020-01-07 ENCOUNTER — Other Ambulatory Visit: Payer: Self-pay

## 2020-01-07 VITALS — BP 101/68 | HR 109 | Ht 68.0 in | Wt 247.6 lb

## 2020-01-07 DIAGNOSIS — N471 Phimosis: Secondary | ICD-10-CM

## 2020-01-07 DIAGNOSIS — R310 Gross hematuria: Secondary | ICD-10-CM | POA: Diagnosis not present

## 2020-01-07 MED ORDER — LIDOCAINE HCL URETHRAL/MUCOSAL 2 % EX GEL
1.0000 "application " | Freq: Once | CUTANEOUS | Status: AC
  Administered 2020-01-07: 1 via URETHRAL

## 2020-01-07 NOTE — Progress Notes (Signed)
Cystoscopy Procedure Note:  Indication: Gross hematuria  After informed consent and discussion of the procedure and its risks, Roshaun Pound was positioned and prepped in the standard fashion. Cystoscopy was performed with a flexible cystoscope.  There was significant phimosis and a hemostat was used to gently dilate the foreskin to allow passage of the cystoscope.  Unable to completely visualize the glans of the penis.  The urethra, bladder neck and entire bladder was visualized in a standard fashion. The prostate was small to moderate in size. The ureteral orifices were visualized in their normal location and orientation.  Bladder mucosa grossly normal throughout, no abnormalities on retroflexion.  Cytology sent.  Imaging: CT urogram reviewed, no urologic abnormalities.  Findings: Phimosis, normal cystoscopy  Assessment and Plan: I suspect his gross hematuria was from some severe fungal/balanitis, as he reports his hematuria/irritation/tissue in the urine resolved after treatment with Mycolog cream.  Will call with cytology results  RTC 6 weeks for exam and symptom check  Legrand Rams, MD 01/07/2020

## 2020-01-07 NOTE — Patient Instructions (Addendum)
Balanitis  Balanitis is swelling and irritation (inflammation) of the head of the penis (glans penis). The condition may also cause inflammation of the skin around the glans penis (foreskin) in men who have not been circumcised. It may develop because of an infection or another medical condition. Balanitis occurs most often among men who have not had their foreskin removed (uncircumcised men). Balanitis sometimes causes scarring of the penis or foreskin, which can require surgery. Untreated balanitis can increase the risk of penile cancer. What are the causes? Common causes of this condition include:  Poor personal hygiene, especially in uncircumcised men. Not cleaning the glans penis and foreskin well can result in buildup of bacteria, viruses, and yeast, which can lead to infection and inflammation.  Irritation and lack of air flow due to fluid (smegma) that can build up on the glans penis. Other causes include:  Chemical irritation from products such as soaps or shower gels (especially those that have fragrance), condoms, personal lubricants, petroleum jelly, spermicides, or fabric softeners.  Skin conditions, such as eczema, dermatitis, and psoriasis.  Allergies to medicines, such as tetracycline and sulfa drugs.  Certain medical conditions, including liver cirrhosis, congestive heart failure, diabetes, and kidney disease.  Infections, such as candidiasis, HPV (human papillomavirus), herpes simplex, gonorrhea, and syphilis.  Severe obesity. What increases the risk? The following factors may make you more likely to develop this condition:  Having diabetes. This is the most common risk factor.  Having a tight foreskin that is difficult to pull back (retract) past the glans.  Having sexual intercourse without using a condom. What are the signs or symptoms? Symptoms of this condition include:  Discharge from under the foreskin.  A bad smell.  Pain or difficulty retracting the  foreskin.  Tenderness, redness, and swelling of the glans.  A rash or sores on the glans or foreskin.  Itchiness.  Inability to get an erection due to pain.  Difficulty urinating.  Scarring of the penis or foreskin, in some cases. How is this diagnosed? This condition may be diagnosed based on:  A physical exam.  Testing a swab of discharge to check for bacterial or fungal infection.  Blood tests: ? To check for viruses that can cause balanitis. ? To check your blood sugar (glucose) level. High blood glucose could be a sign of diabetes, which can cause balanitis. How is this treated? Treatment for balanitis depends on the cause. Treatment may include:  Improving personal hygiene. Your health care provider may recommend sitting in a bath of warm water that is deep enough to cover your hips and buttocks (sitz bath).  Medicines such as: ? Creams or ointments to reduce swelling (steroids) or to treat an infection. ? Antibiotic medicine. ? Antifungal medicine.  Surgery to remove or cut the foreskin (circumcision). This may be done if you have scarring on the foreskin that makes it difficult to retract.  Controlling other medical problems that may be causing your condition or making it worse. Follow these instructions at home:  Do not have sex until the condition clears up, or until your health care provider approves.  Keep your penis clean and dry. Take sitz baths as recommended by your health care provider.  Avoid products that irritate your skin or make symptoms worse, such as soaps and shower gels that have fragrance.  Take over-the-counter and prescription medicines only as told by your health care provider. ? If you were prescribed an antibiotic medicine or a cream or ointment, use it as   told by your health care provider. Do not stop using your medicine, cream, or ointment even if you start to feel better. ? Do not drive or use heavy machinery while taking prescription  pain medicine. Contact a health care provider if:  Your symptoms get worse or do not improve with home care.  You develop chills or a fever.  You have trouble urinating.  You cannot retract your foreskin. Get help right away if:  You develop severe pain.  You are unable to urinate. Summary  Balanitis is inflammation of the head of the penis (glans penis) caused by irritation or infection.  Balanitis causes pain, redness, and swelling of the glans penis.  This condition is most common among uncircumcised men who do not keep their glans penis clean and in men who have diabetes.  Treatment may include creams or ointments.  Good hygiene is important for prevention. This includes pulling back the foreskin when washing your penis. This information is not intended to replace advice given to you by your health care provider. Make sure you discuss any questions you have with your health care provider. Document Revised: 08/16/2017 Document Reviewed: 07/23/2016 Elsevier Patient Education  2020 [employer]  

## 2020-01-08 LAB — URINALYSIS, COMPLETE
Bilirubin, UA: NEGATIVE
Nitrite, UA: NEGATIVE
Specific Gravity, UA: 1.025 (ref 1.005–1.030)
Urobilinogen, Ur: 4 mg/dL — ABNORMAL HIGH (ref 0.2–1.0)
pH, UA: 6 (ref 5.0–7.5)

## 2020-01-08 LAB — MICROSCOPIC EXAMINATION: Epithelial Cells (non renal): >10 /hpf — AB (ref 0–10)

## 2020-01-13 ENCOUNTER — Other Ambulatory Visit: Payer: Self-pay | Admitting: Urology

## 2020-01-14 ENCOUNTER — Telehealth: Payer: Self-pay

## 2020-01-14 NOTE — Progress Notes (Signed)
Good news, no cancer cells on urine sample, keep follow up as scheduled  Legrand Rams, MD 01/14/2020

## 2020-01-14 NOTE — Telephone Encounter (Signed)
Good news, no cancer cells on urine sample, keep follow up as scheduled  Legrand Rams, MD 01/14/2020  Called pt informed him of the information above, pt gave verbal understanding.

## 2020-01-14 NOTE — Telephone Encounter (Signed)
-----   Message from Sondra Come, MD sent at 01/14/2020 10:12 AM EDT -----   ----- Message ----- From: Camille Bal Sent: 01/13/2020   3:58 PM EDT To: Sondra Come, MD

## 2020-02-24 ENCOUNTER — Ambulatory Visit: Payer: Medicare Other | Admitting: Urology

## 2020-08-26 ENCOUNTER — Other Ambulatory Visit: Payer: Self-pay

## 2020-08-26 ENCOUNTER — Encounter: Payer: Self-pay | Admitting: Urology

## 2020-08-26 ENCOUNTER — Ambulatory Visit (INDEPENDENT_AMBULATORY_CARE_PROVIDER_SITE_OTHER): Payer: Medicare Other | Admitting: Urology

## 2020-08-26 VITALS — BP 88/54 | HR 114 | Ht 68.0 in | Wt 254.0 lb

## 2020-08-26 DIAGNOSIS — N476 Balanoposthitis: Secondary | ICD-10-CM | POA: Diagnosis not present

## 2020-08-26 DIAGNOSIS — N471 Phimosis: Secondary | ICD-10-CM | POA: Diagnosis not present

## 2020-08-26 LAB — URINALYSIS, COMPLETE
Bilirubin, UA: NEGATIVE
Glucose, UA: NEGATIVE
Ketones, UA: NEGATIVE
Nitrite, UA: NEGATIVE
Specific Gravity, UA: 1.02 (ref 1.005–1.030)
Urobilinogen, Ur: 4 mg/dL — ABNORMAL HIGH (ref 0.2–1.0)
pH, UA: 7 (ref 5.0–7.5)

## 2020-08-26 LAB — MICROSCOPIC EXAMINATION
Epithelial Cells (non renal): >10 /hpf — ABNORMAL HIGH (ref 0–10)
RBC, Urine: >30 /hpf — ABNORMAL HIGH (ref 0–2)
WBC, UA: >30 /hpf — ABNORMAL HIGH (ref 0–5)

## 2020-08-26 MED ORDER — FLUCONAZOLE 100 MG PO TABS: 100.0000 mg | ORAL_TABLET | Freq: Every day | ORAL | 0 refills | Status: DC

## 2020-08-26 NOTE — H&P (View-Only) (Signed)
08/26/2020 1:14 PM   Lawrence Osborn Sep 29, 1950 [id number]  Referring provider: Danella Penton, MD [phone] [facility] [address] [facility] [place],  [place] [address]  Chief Complaint  Patient presents with  . Recurrent UTI    HPI: 69 y.o. male requested follow-up visit for recurrent urinary infections.   Previously saw Dr. Richardo Hanks 12/2019 for gross hematuria  Noted at that time to have phimosis with significant balanitis  CT urogram was unremarkable and cystoscopy showed mild to moderate prostate enlargement and no significant bladder mucosal abnormalities  It was felt his hematuria was most likely secondary to balanitis  He has continued to have purulent discharge from foreskin associated with urinary frequency, urgency and weak urinary stream  Has been treated with antifungal/steroid cream which has previously been effective but does not appear to be helping recently  + Diabetes   PMH: Past Medical History:  Diagnosis Date  . COPD (chronic obstructive pulmonary disease) (HCC)   . Diabetes mellitus without complication (HCC)   . GI bleed   . Hypertension   . Pneumothorax     Surgical History: Past Surgical History:  Procedure Laterality Date  . APPENDECTOMY    . HERNIA REPAIR      Home Medications:  Allergies as of 08/26/2020   No Known Allergies     Medication List       Accurate as of August 26, 2020  1:14 PM. If you have any questions, ask your nurse or doctor.        albuterol 108 (90 Base) MCG/ACT inhaler Commonly known as: VENTOLIN HFA Inhale 2 puffs into the lungs every 6 (six) hours as needed for wheezing or shortness of breath.   apixaban 5 MG Tabs tablet Commonly known as: ELIQUIS Take 1 tablet (5 mg total) by mouth 2 (two) times daily.   diltiazem 240 MG 24 hr capsule Commonly known as: CARDIZEM CD Take 1 capsule (240 mg total) by mouth daily.   furosemide 20 MG tablet Commonly known as: LASIX Take 20 mg by mouth  daily.   levothyroxine 100 MCG tablet Commonly known as: SYNTHROID Take 100 mcg by mouth daily before breakfast.   nadolol 40 MG tablet Commonly known as: CORGARD Take 2 tablets (80 mg total) by mouth daily.   nystatin-triamcinolone ointment Commonly known as: MYCOLOG APPLY TOPICALLY TO THE AFFECTED AREA TWICE DAILY   omega-3 fish oil 1000 MG Caps capsule Commonly known as: MAXEPA Take 1 capsule by mouth 2 (two) times daily.   omeprazole 20 MG capsule Commonly known as: PRILOSEC Take 20 mg by mouth daily.   QUEtiapine 100 MG tablet Commonly known as: SEROQUEL Take 100-200 mg by mouth 2 (two) times daily. 100 mg in the morning and 200 mg at bedtime   thiamine 100 MG tablet Take 1 tablet (100 mg total) by mouth daily.   traMADol 50 MG tablet Commonly known as: ULTRAM Take 50 mg by mouth 2 (two) times daily.       Allergies: No Known Allergies  Family History: Family History  Problem Relation Age of Onset  . Hypothyroidism Mother   . CAD Father     Social History:  reports that he has never smoked. He has never used smokeless tobacco. He reports current alcohol use of about 2.0 standard drinks of alcohol per week. He reports previous drug use.   Physical Exam: BP (!) 88/54   Pulse (!) 114   Ht 5\' 8"  (1.727 m)   Wt 254 lb (  115.2 kg)   BMI 38.62 kg/m   Constitutional:  Alert and oriented, No acute distress. HEENT: [place] AT, moist mucus membranes.  Trachea midline, no masses. Cardiovascular: No clubbing, cyanosis, or edema. Respiratory: Normal respiratory effort, no increased work of breathing. GI: Abdomen is soft, nontender, nondistended, no abdominal masses GU: Severe phimosis with purulent drainage; unable to retract foreskin Skin: No rashes, bruises or suspicious lesions. Neurologic: Grossly intact, no focal deficits, moving all 4 extremities. Psychiatric: Normal mood and affect.  Laboratory Data:  Urinalysis Dipstick 3+ blood 1+ protein 3+  leukocytes Microscopy >30 WBC, >30 RBC, >10 epis   Assessment & Plan:    1.  Phimosis with recurrent balanoposthitis  Recently unresponsive to topicals  Rx fluconazole 100 mg daily x7 days  We discussed as long as his phimosis remains he is going to have recurrent problems.    I discussed circumcision which she would like to avoid if at all possible  The alternative of dorsal slit was discussed which he was interested in pursuing.  He would prefer this be performed in same-day surgery under local with IV sedation  The procedure was discussed in detail including potential risks of bleeding, infection and recurrent phimosis  Urine culture was ordered    Riki Altes, MD  [facility] [address] [place], [place] [address] [phone]

## 2020-08-26 NOTE — Progress Notes (Signed)
08/26/2020 1:14 PM   Lawrence Osborn Sep 29, 1950 [id number]  Referring provider: Danella Penton, MD [phone] [facility] [address] [facility] [place],  [place] [address]  Chief Complaint  Patient presents with  . Recurrent UTI    HPI: 69 y.o. male requested follow-up visit for recurrent urinary infections.   Previously saw Dr. Richardo Osborn 12/2019 for gross hematuria  Noted at that time to have phimosis with significant balanitis  CT urogram was unremarkable and cystoscopy showed mild to moderate prostate enlargement and no significant bladder mucosal abnormalities  It was felt his hematuria was most likely secondary to balanitis  He has continued to have purulent discharge from foreskin associated with urinary frequency, urgency and weak urinary stream  Has been treated with antifungal/steroid cream which has previously been effective but does not appear to be helping recently  + Diabetes   PMH: Past Medical History:  Diagnosis Date  . COPD (chronic obstructive pulmonary disease) (HCC)   . Diabetes mellitus without complication (HCC)   . GI bleed   . Hypertension   . Pneumothorax     Surgical History: Past Surgical History:  Procedure Laterality Date  . APPENDECTOMY    . HERNIA REPAIR      Home Medications:  Allergies as of 08/26/2020   No Known Allergies     Medication List       Accurate as of August 26, 2020  1:14 PM. If you have any questions, ask your nurse or doctor.        albuterol 108 (90 Base) MCG/ACT inhaler Commonly known as: VENTOLIN HFA Inhale 2 puffs into the lungs every 6 (six) hours as needed for wheezing or shortness of breath.   apixaban 5 MG Tabs tablet Commonly known as: ELIQUIS Take 1 tablet (5 mg total) by mouth 2 (two) times daily.   diltiazem 240 MG 24 hr capsule Commonly known as: CARDIZEM CD Take 1 capsule (240 mg total) by mouth daily.   furosemide 20 MG tablet Commonly known as: LASIX Take 20 mg by mouth  daily.   levothyroxine 100 MCG tablet Commonly known as: SYNTHROID Take 100 mcg by mouth daily before breakfast.   nadolol 40 MG tablet Commonly known as: CORGARD Take 2 tablets (80 mg total) by mouth daily.   nystatin-triamcinolone ointment Commonly known as: MYCOLOG APPLY TOPICALLY TO THE AFFECTED AREA TWICE DAILY   omega-3 fish oil 1000 MG Caps capsule Commonly known as: MAXEPA Take 1 capsule by mouth 2 (two) times daily.   omeprazole 20 MG capsule Commonly known as: PRILOSEC Take 20 mg by mouth daily.   QUEtiapine 100 MG tablet Commonly known as: SEROQUEL Take 100-200 mg by mouth 2 (two) times daily. 100 mg in the morning and 200 mg at bedtime   thiamine 100 MG tablet Take 1 tablet (100 mg total) by mouth daily.   traMADol 50 MG tablet Commonly known as: ULTRAM Take 50 mg by mouth 2 (two) times daily.       Allergies: No Known Allergies  Family History: Family History  Problem Relation Age of Onset  . Hypothyroidism Mother   . CAD Father     Social History:  reports that he has never smoked. He has never used smokeless tobacco. He reports current alcohol use of about 2.0 standard drinks of alcohol per week. He reports previous drug use.   Physical Exam: BP (!) 88/54   Pulse (!) 114   Ht 5\' 8"  (1.727 m)   Wt 254 lb (  115.2 kg)   BMI 38.62 kg/m   Constitutional:  Alert and oriented, No acute distress. HEENT: [place] AT, moist mucus membranes.  Trachea midline, no masses. Cardiovascular: No clubbing, cyanosis, or edema. Respiratory: Normal respiratory effort, no increased work of breathing. GI: Abdomen is soft, nontender, nondistended, no abdominal masses GU: Severe phimosis with purulent drainage; unable to retract foreskin Skin: No rashes, bruises or suspicious lesions. Neurologic: Grossly intact, no focal deficits, moving all 4 extremities. Psychiatric: Normal mood and affect.  Laboratory Data:  Urinalysis Dipstick 3+ blood 1+ protein 3+  leukocytes Microscopy >30 WBC, >30 RBC, >10 epis   Assessment & Plan:    1.  Phimosis with recurrent balanoposthitis  Recently unresponsive to topicals  Rx fluconazole 100 mg daily x7 days  We discussed as long as his phimosis remains he is going to have recurrent problems.    I discussed circumcision which she would like to avoid if at all possible  The alternative of dorsal slit was discussed which he was interested in pursuing.  He would prefer this be performed in same-day surgery under local with IV sedation  The procedure was discussed in detail including potential risks of bleeding, infection and recurrent phimosis  Urine culture was ordered    Lawrence Stetzer C Moris Ratchford, MD  [facility] [address], [address] [place], [place] [address] [phone]  

## 2020-08-29 ENCOUNTER — Telehealth: Payer: Self-pay | Admitting: *Deleted

## 2020-08-29 ENCOUNTER — Other Ambulatory Visit: Payer: Self-pay | Admitting: Urology

## 2020-08-29 MED ORDER — AMOXICILLIN 875 MG PO TABS: 875.0000 mg | ORAL_TABLET | Freq: Two times a day (BID) | ORAL | 0 refills | Status: AC

## 2020-08-29 NOTE — Telephone Encounter (Signed)
-----   Message from Riki Altes, MD sent at 08/29/2020  8:16 AM EST ----- Urine culture also growing bacteria.  Antibiotic Rx was sent to pharmacy

## 2020-08-29 NOTE — Telephone Encounter (Signed)
Notified patient wife as instructed, patient pleased. Discussed follow-up appointments, patient agrees  

## 2020-08-31 ENCOUNTER — Other Ambulatory Visit: Payer: Self-pay | Admitting: Radiology

## 2020-08-31 DIAGNOSIS — N471 Phimosis: Secondary | ICD-10-CM

## 2020-08-31 DIAGNOSIS — N481 Balanitis: Secondary | ICD-10-CM

## 2020-08-31 LAB — CULTURE, URINE COMPREHENSIVE

## 2020-09-14 ENCOUNTER — Other Ambulatory Visit: Payer: Self-pay

## 2020-09-14 ENCOUNTER — Encounter
Admission: RE | Admit: 2020-09-14 | Discharge: 2020-09-14 | Disposition: A | Discharge status: Home / Self Care | Payer: Medicare Other | Source: Ambulatory Visit | Attending: Urology | Admitting: Urology

## 2020-09-14 HISTORY — DX: Peritonitis, unspecified: K65.9

## 2020-09-14 HISTORY — DX: Unspecified cirrhosis of liver: K74.60

## 2020-09-14 HISTORY — DX: Anemia, unspecified: D64.9

## 2020-09-14 HISTORY — DX: Acute embolism and thrombosis of unspecified deep veins of unspecified lower extremity: I82.409

## 2020-09-14 NOTE — Patient Instructions (Signed)
Your procedure is scheduled on: 09/20/20 Report to DAY SURGERY DEPARTMENT LOCATED ON 2ND FLOOR MEDICAL MALL ENTRANCE. To find out your arrival time please call [phone] between 1PM - 3PM on 09/19/20.  Remember: Instructions that are not followed completely may result in serious medical risk, up to and including death, or upon the discretion of your surgeon and anesthesiologist your surgery may need to be rescheduled.     _X__ 1. Do not eat food after midnight the night before your procedure.                 No gum chewing or hard candies. Y  __X__2.  On the morning of surgery brush your teeth with toothpaste and water, you                 may rinse your mouth with mouthwash if you wish.  Do not swallow any              toothpaste of mouthwash.     _X__ 3.  No Alcohol for 24 hours before or after surgery.   _X__ 4.  Do Not Smoke or use e-cigarettes For 24 Hours Prior to Your Surgery.                 Do not use any chewable tobacco products for at least 6 hours prior to                 surgery.  ____  5.  Bring all medications with you on the day of surgery if instructed.   __X__  6.  Notify your doctor if there is any change in your medical condition      (cold, fever, infections).     Do not wear jewelry, make-up, hairpins, clips or nail polish. Do not wear lotions, powders, or perfumes.  Do not shave 48 hours prior to surgery. Men may shave face and neck. Do not bring valuables to the hospital.    [facility] is not responsible for any belongings or valuables.  Contacts, dentures/partials or body piercings may not be worn into surgery. Bring a case for your contacts, glasses or hearing aids, a denture cup will be supplied. Leave your suitcase in the car. After surgery it may be brought to your room. For patients admitted to the hospital, discharge time is determined by your treatment team.   Patients discharged the day of surgery will not be allowed to drive home.   Please read  over the following fact sheets that you were given:   MRSA Information  __X__ Take these medicines the morning of surgery with A SIP OF WATER:    1. levothyroxine (SYNTHROID, LEVOTHROID) 100 MCG tablet  2. nadolol (CORGARD) 40 MG tablet  3. omeprazole (PRILOSEC) 20 MG capsule  4. QUEtiapine (SEROQUEL) 100 MG tablet  5. traMADol (ULTRAM) 50 MG tablet IF NEEDED  6.  ____ Fleet Enema (as directed)   ____ Use CHG Soap/SAGE wipes as directed  __X__ Use inhalers on the day of surgery    ____ Stop metformin/Janumet/Farxiga 2 days prior to surgery    __X__ Take 1/2 of usual insulin dose the night before surgery. No insulin the morning          of surgery.   __X__ Stop Blood Thinners Coumadin/Plavix/Xarelto/Pleta/Pradaxa/Eliquis/Effient/Aspirin  on   Or contact your Surgeon, Cardiologist or Medical Doctor regarding  ability to stop your blood thinners  STOP ELIQUIS AS INSTRUCTED   __X__ Stop Anti-inflammatories 7 days before surgery such as Advil, Ibuprofen, Motrin,  BC or Goodies Powder, Naprosyn, Naproxen, Aleve, Aspirin    __X__ Stop all herbal supplements, fish oil or vitamin E until after surgery. STOP THE FISH OIL AND [facility] JOHN'S WORT TODAY 12/29   ____ Bring C-Pap to the hospital.

## 2020-09-15 ENCOUNTER — Encounter
Admission: RE | Admit: 2020-09-15 | Discharge: 2020-09-15 | Disposition: A | Discharge status: Home / Self Care | Payer: Medicare Other | Source: Ambulatory Visit | Attending: Urology | Admitting: Urology

## 2020-09-15 DIAGNOSIS — E118 Type 2 diabetes mellitus with unspecified complications: Secondary | ICD-10-CM | POA: Diagnosis not present

## 2020-09-15 DIAGNOSIS — Z01818 Encounter for other preprocedural examination: Secondary | ICD-10-CM | POA: Diagnosis not present

## 2020-09-15 DIAGNOSIS — I10 Essential (primary) hypertension: Secondary | ICD-10-CM | POA: Diagnosis not present

## 2020-09-15 LAB — CBC
HCT: 38.8 % — ABNORMAL LOW (ref 39.0–52.0)
Hemoglobin: 12.4 g/dL — ABNORMAL LOW (ref 13.0–17.0)
MCH: 28.2 pg (ref 26.0–34.0)
MCHC: 32 g/dL (ref 30.0–36.0)
MCV: 88.4 fL (ref 80.0–100.0)
Platelets: 52 10*3/uL — ABNORMAL LOW (ref 150–400)
RBC: 4.39 MIL/uL (ref 4.22–5.81)
RDW: 20.5 % — ABNORMAL HIGH (ref 11.5–15.5)
WBC: 2.6 10*3/uL — ABNORMAL LOW (ref 4.0–10.5)
nRBC: 0 % (ref 0.0–0.2)

## 2020-09-15 LAB — COMPREHENSIVE METABOLIC PANEL
ALT: 23 U/L (ref 0–44)
AST: 47 U/L — ABNORMAL HIGH (ref 15–41)
Albumin: 2.9 g/dL — ABNORMAL LOW (ref 3.5–5.0)
Alkaline Phosphatase: 113 U/L (ref 38–126)
Anion gap: 9 (ref 5–15)
BUN: 7 mg/dL — ABNORMAL LOW (ref 8–23)
CO2: 27 mmol/L (ref 22–32)
Calcium: 8.7 mg/dL — ABNORMAL LOW (ref 8.9–10.3)
Chloride: 99 mmol/L (ref 98–111)
Creatinine, Ser: 0.56 mg/dL — ABNORMAL LOW (ref 0.61–1.24)
GFR, Estimated: >60 mL/min (ref >60)
Glucose, Bld: 225 mg/dL — ABNORMAL HIGH (ref 70–99)
Potassium: 4.2 mmol/L (ref 3.5–5.1)
Sodium: 135 mmol/L (ref 135–145)
Total Bilirubin: 1.4 mg/dL — ABNORMAL HIGH (ref 0.3–1.2)
Total Protein: 6.8 g/dL (ref 6.5–8.1)

## 2020-09-15 LAB — PROTIME-INR
INR: 1.6 — ABNORMAL HIGH (ref 0.8–1.2)
Prothrombin Time: 18.8 seconds — ABNORMAL HIGH (ref 11.4–15.2)

## 2020-09-15 NOTE — Progress Notes (Signed)
  [facility] Perioperative Services: Pre-Admission/Anesthesia Testing  Abnormal Lab Notification   Date: 09/15/20  Name: Lawrence Osborn MRN:   [id number]  Re: Abnormal labs noted during PAT appointment   Provider(s) Notified: Riki Altes, MD Notification mode: Routed and/or faxed via [facility]   ABNORMAL LAB VALUE(S): Lab Results  Component Value Date   PLT 52 (L) 09/15/2020    Notes:  Patient is scheduled for a dorsal slit on 09/20/2020. Patient is on daily apixaban. PT 18.8 and INR 1.6. Estimated Creatinine Clearance: 108.4 mL/min (A) (by C-G formula based on SCr of 0.56 mg/dL (L)). This is a Personal assistant; no formal response is required.  Quentin Mulling, MSN, APRN, FNP-C, CEN [facility]  Peri-operative Services Nurse Practitioner Phone: [phone] Fax: [phone] 09/15/20 1:05 PM

## 2020-09-16 ENCOUNTER — Other Ambulatory Visit: Payer: Self-pay

## 2020-09-16 ENCOUNTER — Other Ambulatory Visit
Admission: RE | Admit: 2020-09-16 | Discharge: 2020-09-16 | Disposition: A | Discharge status: Home / Self Care | Payer: Medicare Other | Source: Ambulatory Visit | Attending: Urology | Admitting: Urology

## 2020-09-16 DIAGNOSIS — Z01812 Encounter for preprocedural laboratory examination: Secondary | ICD-10-CM | POA: Diagnosis present

## 2020-09-16 DIAGNOSIS — Z20822 Contact with and (suspected) exposure to covid-19: Secondary | ICD-10-CM | POA: Diagnosis not present

## 2020-09-17 LAB — SARS CORONAVIRUS 2 (TAT 6-24 HRS): SARS Coronavirus 2: NEGATIVE

## 2020-09-20 ENCOUNTER — Encounter
Admission: RE | Disposition: A | Discharge status: Home / Self Care | Payer: Self-pay | Source: Home / Self Care | Attending: Urology

## 2020-09-20 ENCOUNTER — Other Ambulatory Visit: Payer: Self-pay

## 2020-09-20 ENCOUNTER — Ambulatory Visit
Admission: RE | Admit: 2020-09-20 | Discharge: 2020-09-20 | Disposition: A | Discharge status: Home / Self Care | Payer: Medicare Other | Attending: Urology | Admitting: Urology

## 2020-09-20 ENCOUNTER — Ambulatory Visit: Payer: Medicare Other | Admitting: Anesthesiology

## 2020-09-20 ENCOUNTER — Encounter: Payer: Self-pay | Admitting: Urology

## 2020-09-20 DIAGNOSIS — Z79899 Other long term (current) drug therapy: Secondary | ICD-10-CM | POA: Diagnosis not present

## 2020-09-20 DIAGNOSIS — N481 Balanitis: Secondary | ICD-10-CM

## 2020-09-20 DIAGNOSIS — Z79891 Long term (current) use of opiate analgesic: Secondary | ICD-10-CM | POA: Insufficient documentation

## 2020-09-20 DIAGNOSIS — Z79890 Hormone replacement therapy (postmenopausal): Secondary | ICD-10-CM | POA: Insufficient documentation

## 2020-09-20 DIAGNOSIS — N476 Balanoposthitis: Secondary | ICD-10-CM | POA: Diagnosis not present

## 2020-09-20 DIAGNOSIS — N471 Phimosis: Secondary | ICD-10-CM | POA: Diagnosis present

## 2020-09-20 DIAGNOSIS — Z7901 Long term (current) use of anticoagulants: Secondary | ICD-10-CM | POA: Insufficient documentation

## 2020-09-20 HISTORY — PX: DORSAL SLIT: SHX6822

## 2020-09-20 LAB — GLUCOSE, CAPILLARY
Glucose-Capillary: 164 mg/dL — ABNORMAL HIGH (ref 70–99)
Glucose-Capillary: 143 mg/dL — ABNORMAL HIGH (ref 70–99)

## 2020-09-20 SURGERY — DORSAL SLIT, PREPUCE
Anesthesia: General | Site: Penis

## 2020-09-20 MED ORDER — CEFAZOLIN SODIUM-DEXTROSE 2-4 GM/100ML-% IV SOLN
2.0000 g | INTRAVENOUS | Status: AC
  Administered 2020-09-20: 2 g via INTRAVENOUS

## 2020-09-20 MED ORDER — ONDANSETRON HCL 4 MG/2ML IJ SOLN
INTRAMUSCULAR | Status: DC | PRN
  Administered 2020-09-20: 4 mg via INTRAVENOUS

## 2020-09-20 MED ORDER — SODIUM CHLORIDE 0.9 % IV SOLN: INTRAVENOUS | Status: DC

## 2020-09-20 MED ORDER — ROCURONIUM BROMIDE 100 MG/10ML IV SOLN
INTRAVENOUS | Status: DC | PRN
  Administered 2020-09-20: 10 mg via INTRAVENOUS
  Administered 2020-09-20: 40 mg via INTRAVENOUS

## 2020-09-20 MED ORDER — PROPOFOL 10 MG/ML IV BOLUS
INTRAVENOUS | Status: DC | PRN
  Administered 2020-09-20: 130 mg via INTRAVENOUS

## 2020-09-20 MED ORDER — PHENYLEPHRINE HCL (PRESSORS) 10 MG/ML IV SOLN
INTRAVENOUS | Status: DC | PRN
  Administered 2020-09-20: 100 ug via INTRAVENOUS
  Administered 2020-09-20: 200 ug via INTRAVENOUS
  Administered 2020-09-20: 100 ug via INTRAVENOUS
  Administered 2020-09-20 (×2): 200 ug via INTRAVENOUS

## 2020-09-20 MED ORDER — FENTANYL CITRATE (PF) 100 MCG/2ML IJ SOLN
INTRAMUSCULAR | Status: AC
  Filled 2020-09-20: qty 2

## 2020-09-20 MED ORDER — EPHEDRINE SULFATE 50 MG/ML IJ SOLN
INTRAMUSCULAR | Status: DC | PRN
  Administered 2020-09-20 (×3): 10 mg via INTRAVENOUS

## 2020-09-20 MED ORDER — CHLORHEXIDINE GLUCONATE 0.12 % MT SOLN
OROMUCOSAL | Status: AC
  Administered 2020-09-20: 15 mL via OROMUCOSAL
  Filled 2020-09-20: qty 15

## 2020-09-20 MED ORDER — BUPIVACAINE HCL 0.25 % IJ SOLN
INTRAMUSCULAR | Status: DC | PRN
  Administered 2020-09-20: 7 mL

## 2020-09-20 MED ORDER — CHLORHEXIDINE GLUCONATE 0.12 % MT SOLN: 15.0000 mL | Freq: Once | OROMUCOSAL | Status: AC

## 2020-09-20 MED ORDER — SUGAMMADEX SODIUM 200 MG/2ML IV SOLN
INTRAVENOUS | Status: DC | PRN
  Administered 2020-09-20: 300 mg via INTRAVENOUS

## 2020-09-20 MED ORDER — OXYCODONE HCL 5 MG PO TABS
5.0000 mg | ORAL_TABLET | Freq: Once | ORAL | Status: DC | PRN
Start: 2020-09-20 — End: 2020-09-20

## 2020-09-20 MED ORDER — EPHEDRINE 5 MG/ML INJ
INTRAVENOUS | Status: AC
  Filled 2020-09-20: qty 10

## 2020-09-20 MED ORDER — ORAL CARE MOUTH RINSE: 15.0000 mL | Freq: Once | OROMUCOSAL | Status: AC

## 2020-09-20 MED ORDER — MIDAZOLAM HCL 2 MG/2ML IJ SOLN
INTRAMUSCULAR | Status: AC
  Filled 2020-09-20: qty 2

## 2020-09-20 MED ORDER — LIDOCAINE HCL (CARDIAC) PF 100 MG/5ML IV SOSY
PREFILLED_SYRINGE | INTRAVENOUS | Status: DC | PRN
  Administered 2020-09-20: 100 mg via INTRAVENOUS

## 2020-09-20 MED ORDER — FENTANYL CITRATE (PF) 100 MCG/2ML IJ SOLN
INTRAMUSCULAR | Status: DC | PRN
  Administered 2020-09-20: 50 ug via INTRAVENOUS

## 2020-09-20 MED ORDER — BUPIVACAINE HCL (PF) 0.25 % IJ SOLN
INTRAMUSCULAR | Status: AC
  Filled 2020-09-20: qty 30

## 2020-09-20 MED ORDER — SUCCINYLCHOLINE CHLORIDE 20 MG/ML IJ SOLN
INTRAMUSCULAR | Status: DC | PRN
  Administered 2020-09-20: 100 mg via INTRAVENOUS

## 2020-09-20 MED ORDER — OXYCODONE HCL 5 MG/5ML PO SOLN
5.0000 mg | Freq: Once | ORAL | Status: DC | PRN
Start: 2020-09-20 — End: 2020-09-20

## 2020-09-20 MED ORDER — CEFAZOLIN SODIUM-DEXTROSE 2-4 GM/100ML-% IV SOLN
INTRAVENOUS | Status: AC
  Filled 2020-09-20: qty 100

## 2020-09-20 MED ORDER — FENTANYL CITRATE (PF) 100 MCG/2ML IJ SOLN: 25.0000 ug | INTRAMUSCULAR | Status: DC | PRN

## 2020-09-20 MED ORDER — BACITRACIN ZINC 500 UNIT/GM EX OINT
TOPICAL_OINTMENT | CUTANEOUS | Status: AC
  Filled 2020-09-20: qty 28.35

## 2020-09-20 MED ORDER — MIDAZOLAM HCL 2 MG/2ML IJ SOLN
INTRAMUSCULAR | Status: DC | PRN
  Administered 2020-09-20: 2 mg via INTRAVENOUS

## 2020-09-20 MED ORDER — HYDROCODONE-ACETAMINOPHEN 5-325 MG PO TABS: 1.0000 | ORAL_TABLET | ORAL | 0 refills | Status: DC | PRN

## 2020-09-20 SURGICAL SUPPLY — 32 items
APL PRP STRL LF DISP 70% ISPRP (MISCELLANEOUS) ×1
BLADE CLIPPER SURG (BLADE) ×2 IMPLANT
BLADE SURG 15 STRL LF DISP TIS (BLADE) ×1 IMPLANT
BLADE SURG 15 STRL SS (BLADE) ×2
BNDG COHESIVE 1X5 TAN NS LF (GAUZE/BANDAGES/DRESSINGS) IMPLANT
BNDG CONFORM 2 STRL LF (GAUZE/BANDAGES/DRESSINGS) ×2 IMPLANT
CANISTER SUCT 1200ML W/VALVE (MISCELLANEOUS) ×2 IMPLANT
CHLORAPREP W/TINT 26 (MISCELLANEOUS) ×2 IMPLANT
COVER WAND RF STERILE (DRAPES) ×2 IMPLANT
DRAPE LAPAROTOMY 77X122 PED (DRAPES) ×2 IMPLANT
ELECT REM PT RETURN 9FT ADLT (ELECTROSURGICAL) ×2
ELECTRODE REM PT RTRN 9FT ADLT (ELECTROSURGICAL) ×1 IMPLANT
GAUZE PETROLATUM 1 X8 (GAUZE/BANDAGES/DRESSINGS) ×2 IMPLANT
GLOVE BIOGEL PI IND STRL 7.5 (GLOVE) ×1 IMPLANT
GLOVE BIOGEL PI INDICATOR 7.5 (GLOVE) ×1
GOWN STRL REUS W/ TWL LRG LVL3 (GOWN DISPOSABLE) ×1 IMPLANT
GOWN STRL REUS W/ TWL XL LVL3 (GOWN DISPOSABLE) ×1 IMPLANT
GOWN STRL REUS W/TWL LRG LVL3 (GOWN DISPOSABLE) ×2
GOWN STRL REUS W/TWL XL LVL3 (GOWN DISPOSABLE) ×2
KIT TURNOVER KIT A (KITS) ×2 IMPLANT
LABEL OR SOLS (LABEL) ×2 IMPLANT
MANIFOLD NEPTUNE II (INSTRUMENTS) ×2 IMPLANT
NEEDLE HYPO 25X1 1.5 SAFETY (NEEDLE) ×2 IMPLANT
NS IRRIG 500ML POUR BTL (IV SOLUTION) ×2 IMPLANT
PACK BASIN MINOR ARMC (MISCELLANEOUS) ×2 IMPLANT
SOL PREP PVP 2OZ (MISCELLANEOUS) ×2
SOLUTION PREP PVP 2OZ (MISCELLANEOUS) ×1 IMPLANT
STRETCH NET 2 107126 (MISCELLANEOUS) ×2 IMPLANT
SUT CHROMIC 3 0 SH 27 (SUTURE) ×2 IMPLANT
SUT CHROMIC 4 0 RB 1X27 (SUTURE) ×2 IMPLANT
SUT CHROMIC 4 0 SH 27 (SUTURE) IMPLANT
SYR 10ML LL (SYRINGE) ×2 IMPLANT

## 2020-09-20 NOTE — Transfer of Care (Signed)
Immediate Anesthesia Transfer of Care Note  Patient: Lawrence Osborn  Procedure(s) Performed: DORSAL SLIT (N/A Penis)  Patient Location: PACU  Anesthesia Type:General  Level of Consciousness: sedated  Airway & Oxygen Therapy: Patient Spontanous Breathing and Patient connected to face mask oxygen  Post-op Assessment: Report given to RN and Post -op Vital signs reviewed and stable  Post vital signs: Reviewed and stable  Last Vitals:  Vitals Value Taken Time  BP 114/83 09/20/20 1157  Temp 36.2 C 09/20/20 1157  Pulse 102 09/20/20 1205  Resp 10 09/20/20 1205  SpO2 100 % 09/20/20 1205  Vitals shown include unvalidated device data.  Last Pain:  Vitals:   09/20/20 1157  PainSc: Asleep         Complications: No complications documented.

## 2020-09-20 NOTE — Anesthesia Preprocedure Evaluation (Addendum)
Anesthesia Evaluation  Patient identified by MRN, date of birth, ID band Patient awake    Reviewed: Allergy & Precautions, H&P , NPO status , Patient's Chart, lab work & pertinent test results  History of Anesthesia Complications Negative for: history of anesthetic complications  Airway Mallampati: III  TM Distance: <3 FB Neck ROM: full    Dental  (+) Chipped, Poor Dentition, Missing   Pulmonary neg shortness of breath, COPD, former smoker,    Pulmonary exam normal        Cardiovascular Exercise Tolerance: Good hypertension, (-) angina+ CAD  (-) DOE + dysrhythmias Atrial Fibrillation  Rhythm:irregular Rate:Tachycardia     Neuro/Psych PSYCHIATRIC DISORDERS  Neuromuscular disease    GI/Hepatic GERD  Medicated and Controlled,(+) Cirrhosis       ,   Endo/Other  diabetes, Type 2Hypothyroidism   Renal/GU      Musculoskeletal   Abdominal   Peds  Hematology  (+) Blood dyscrasia, , REFUSES BLOOD PRODUCTS,   Anesthesia Other Findings Signs and symptoms suggestive of sleep apnea   Past Medical History: No date: Anemia No date: Cirrhosis of liver (HCC) No date: COPD (chronic obstructive pulmonary disease) (HCC) No date: Diabetes mellitus without complication (HCC) No date: DVT (deep venous thrombosis) (HCC)     Comment:  mid calf to groin No date: GI bleed No date: Hypertension No date: Peritonitis (HCC) No date: Pneumothorax  Past Surgical History: No date: APPENDECTOMY No date: COLONOSCOPY No date: HERNIA REPAIR  BMI    Body Mass Index: 37.25 kg/m      Reproductive/Obstetrics negative OB ROS                           Anesthesia Physical Anesthesia Plan  ASA: III  Anesthesia Plan: General ETT   Post-op Pain Management:    Induction: Intravenous  PONV Risk Score and Plan: Dexamethasone, Ondansetron, Midazolam and Treatment may vary due to age or medical condition  Airway  Management Planned: Oral ETT  Additional Equipment:   Intra-op Plan:   Post-operative Plan: Extubation in OR  Informed Consent: I have reviewed the patients History and Physical, chart, labs and discussed the procedure including the risks, benefits and alternatives for the proposed anesthesia with the patient or authorized representative who has indicated his/her understanding and acceptance.     Dental Advisory Given  Plan Discussed with: Anesthesiologist, CRNA and Surgeon  Anesthesia Plan Comments: (Patient consented for risks of anesthesia including but not limited to:  - adverse reactions to medications - damage to eyes, teeth, lips or other oral mucosa - nerve damage due to positioning  - sore throat or hoarseness - Damage to heart, brain, nerves, lungs, other parts of body or loss of life  Patient voiced understanding.)       Anesthesia Quick Evaluation

## 2020-09-20 NOTE — Anesthesia Procedure Notes (Signed)
Procedure Name: Intubation Date/Time: 09/20/2020 11:16 AM Performed by: Hezzie Bump, CRNA Pre-anesthesia Checklist: Patient identified, Patient being monitored, Timeout performed, Emergency Drugs available and Suction available Patient Re-evaluated:Patient Re-evaluated prior to induction Oxygen Delivery Method: Circle system utilized Preoxygenation: Pre-oxygenation with 100% oxygen Induction Type: IV induction Ventilation: Mask ventilation without difficulty Laryngoscope Size: 3 and McGraph Grade View: Grade I Tube type: Oral Tube size: 7.5 mm Number of attempts: 1 Airway Equipment and Method: Stylet Placement Confirmation: ETT inserted through vocal cords under direct vision,  positive ETCO2 and breath sounds checked- equal and bilateral Secured at: 21 cm Tube secured with: Tape Dental Injury: Teeth and Oropharynx as per pre-operative assessment

## 2020-09-20 NOTE — Progress Notes (Addendum)
   09/20/20 0737  Clinical Encounter Type  Visited With Family  Visit Type Initial  Referral From Chaplain  Consult/Referral To Chaplain  Advance Directives (For Healthcare)  Does Patient Have a Medical Advance Directive? No  Would patient like information on creating a medical advance directive? No - Patient declined  While rounding [facility] waiting area, chaplain visited with Pt's wife, who said she says her prayers every night, which is needed. She also said "we got here safely." chaplain said, "that's a good thing." Before leaving chaplain asked if she could pray with her and she said yes. Chaplain knelt down and prayed with Pt's wife and left shortly thereafter.

## 2020-09-20 NOTE — Interval H&P Note (Signed)
History and Physical Interval Note:  CV: RRR Lungs: Clear  09/20/2020 10:57 AM  Lawrence Osborn  has presented today for surgery, with the diagnosis of phimosis with recurrent balanitis.  The various methods of treatment have been discussed with the patient and family. After consideration of risks, benefits and other options for treatment, the patient has consented to  Procedure(s) with comments: DORSAL SLIT (N/A) - MAC and local as a surgical intervention.  The patient's history has been reviewed, patient examined, no change in status, stable for surgery.  I have reviewed the patient's chart and labs.  Questions were answered to the patient's satisfaction.     [place]

## 2020-09-20 NOTE — Discharge Instructions (Signed)
AMBULATORY SURGERY  DISCHARGE INSTRUCTIONS   1) The drugs that you were given will stay in your system until tomorrow so for the next 24 hours you should not:  A) Drive an automobile B) Make any legal decisions C) Drink any alcoholic beverage   2) You may resume regular meals tomorrow.  Today it is better to start with liquids and gradually work up to solid foods.  You may eat anything you prefer, but it is better to start with liquids, then soup and crackers, and gradually work up to solid foods.   3) Please notify your doctor immediately if you have any unusual bleeding, trouble breathing, redness and pain at the surgery site, drainage, fever, or pain not relieved by medication.  4) Your post-operative visit with Dr.                                     is: Date:                        Time:    Please call to schedule your post-operative visit.  5) Additional Instructions:  Postoperative instructions for dorsal slit  Wound:  In most cases your incision will have absorbable sutures that run along the course of your incision and will dissolve within the first 10-20 days. Some will fall out even earlier. Expect some redness as the sutures dissolved but this should occur only around the sutures. If there is generalized redness, especially with increasing pain or swelling, let us know. The penis may get "black and blue" as the blood in the tissues spread. Sometimes the whole penis will turn colors. The black and blue is followed by a yellow and brown color. In time, all the discoloration will go away.  Diet:  You may return to your normal diet within 24 hours following your surgery. You may note some mild nausea and possibly vomiting the first 6-8 hours following surgery. This is usually due to the side effects of anesthesia, and will disappear quite soon. I would suggest clear liquids and a very light meal the first evening following your surgery.  Activity:  Your physical activity  should be restricted the first 24 hours. During that time you should remain relatively inactive, moving about only when necessary. During the first 7-10 days following surgery he should avoid lifting any heavy objects (anything greater than 15 pounds), and avoid strenuous exercise. If you work, ask [place] specifically about your restrictions, both for work and home. We will write a note to your employer if needed.  Ice packs can be placed on and off over the penis for the first 48 hours to help relieve the pain and keep the swelling down. Frozen peas or corn in a ZipLock bag can be frozen, used and re-frozen. Fifteen minutes on and 15 minutes off is a reasonable schedule.   Dressing:  Your dressing consist of 3 layers: An inner layer of Vaseline gauze around the incision, a gauze dressing around the penis and a mesh dressing to hold in place.  You may remove this dressing in 48 hours.  Remove earlier if it gets saturated with urine or falls over the head of the penis.  If the Vaseline gauze sticks to the incision you may remove in the shower.  Hygiene:  You may shower 48 hours after your surgery. Tub bathing should be restricted until the  seventh day.  Medication:  You will be sent home with some type of pain medication. In many cases you will be sent home with a narcotic pain pill (Vicodin or Tylox). If the pain is not too bad, you may take either Tylenol (acetaminophen) or Advil (ibuprofen) which contain no narcotic agents, and might be tolerated a little better, with fewer side effects. If the pain medication you are sent home with does not control the pain, you will have to let us know. Some narcotic pain medications cannot be given or refilled by a phone call to a pharmacy.  You may resume your Eliquis 09/22/2019  Problems you should report to [place]:   Fever of 101.0 degrees Fahrenheit or greater.  Moderate or severe swelling under the skin incision or involving the scrotum.  Drug reaction such  as hives, a rash, nausea or vomiting.

## 2020-09-20 NOTE — Op Note (Signed)
Preoperative diagnosis:  1. Phimosis 2. Recurrent balanoposthitis  Postop diagnosis: Same  Procedure: Dorsal slit  Anesthesia:  1. General/LMA  2. Dorsal penile/ring block 0.25% bupivacaine  Complications: None  Indications: Lawrence Osborn is a 70y.o. 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  After discussing management options he has elected to proceed with dorsal slit and lieu of circumcision. After reviewing the management options for treatment, he elected to proceed with the above surgical procedure(s). We have discussed the potential benefits and risks of the procedure, side effects of the proposed treatment, the likelihood of the patient achieving the goals of the procedure, and any potential problems that might occur during the procedure or recuperation. Informed consent has been obtained.  Description: The patient was taken to the operating room and general anesthesia was induced.  The patient was placed in the supine position, prepped and draped in the usual sterile fashion, and preoperative antibiotics were administered. A preoperative time-out was performed.   A dorsal penile block was performed with 5 mL of 0.25% plain bupivacaine. The dorsal prepuce was clamped with a Kocher and incised with scissors just beyond the corona radiata.  Hemostasis was obtained with cautery.  The inner/outer preputial edges were then sutured on each side with running 3-0 chromic suture.  A dressing of Vaseline gauze, conform and was applied.  Plan:  Follow-up office visit 1 month  Additional recommendations per discharge instructions    Irineo Axon, MD

## 2020-09-20 NOTE — Anesthesia Postprocedure Evaluation (Signed)
Anesthesia Post Note  Patient: Lawrence Osborn  Procedure(s) Performed: DORSAL SLIT (N/A Penis)  Patient location during evaluation: PACU Anesthesia Type: General Level of consciousness: awake and alert Pain management: pain level controlled Vital Signs Assessment: post-procedure vital signs reviewed and stable Respiratory status: spontaneous breathing, nonlabored ventilation, respiratory function stable and patient connected to nasal cannula oxygen Cardiovascular status: blood pressure returned to baseline and stable Postop Assessment: no apparent nausea or vomiting Anesthetic complications: no   No complications documented.   Last Vitals:  Vitals:   09/20/20 1230 09/20/20 1244  BP: 104/64 101/74  Pulse: (!) 104 (!) 103  Resp: 15 14  Temp: (!) 36.4 C   SpO2: 93% 90%    Last Pain:  Vitals:   09/20/20 1230  PainSc: 0-No pain                 Cleda Mccreedy Edenilson Austad

## 2020-09-21 ENCOUNTER — Telehealth: Payer: Self-pay | Admitting: Urology

## 2020-09-21 ENCOUNTER — Encounter: Payer: Self-pay | Admitting: Urology

## 2020-09-21 NOTE — Addendum Note (Signed)
Addendum  created 09/21/20 0628 by Moussa Wiegand, CRNA   Intraprocedure Event edited    

## 2020-09-21 NOTE — Telephone Encounter (Signed)
Called to schedule post op and his wife stated she would have to call back to schedule when she got her new computer.

## 2020-09-21 NOTE — Telephone Encounter (Signed)
-----   Message from Riki Altes, MD sent at 09/20/2020 11:21 PM EST ----- Regarding: Postop follow-up Please schedule postop follow-up 1 month.  Thanks

## 2021-04-11 ENCOUNTER — Encounter: Payer: Self-pay | Admitting: Intensive Care

## 2021-04-11 ENCOUNTER — Emergency Department: Payer: Medicare Other

## 2021-04-11 ENCOUNTER — Emergency Department
Admission: EM | Admit: 2021-04-11 | Discharge: 2021-04-11 | Disposition: A | Discharge status: Home / Self Care | Payer: Medicare Other | Attending: Emergency Medicine | Admitting: Emergency Medicine

## 2021-04-11 ENCOUNTER — Other Ambulatory Visit: Payer: Self-pay

## 2021-04-11 DIAGNOSIS — I2510 Atherosclerotic heart disease of native coronary artery without angina pectoris: Secondary | ICD-10-CM | POA: Diagnosis not present

## 2021-04-11 DIAGNOSIS — J449 Chronic obstructive pulmonary disease, unspecified: Secondary | ICD-10-CM | POA: Insufficient documentation

## 2021-04-11 DIAGNOSIS — R140 Abdominal distension (gaseous): Secondary | ICD-10-CM | POA: Diagnosis present

## 2021-04-11 DIAGNOSIS — R0609 Other forms of dyspnea: Secondary | ICD-10-CM | POA: Diagnosis not present

## 2021-04-11 DIAGNOSIS — R0602 Shortness of breath: Secondary | ICD-10-CM

## 2021-04-11 DIAGNOSIS — Z7901 Long term (current) use of anticoagulants: Secondary | ICD-10-CM | POA: Insufficient documentation

## 2021-04-11 DIAGNOSIS — I10 Essential (primary) hypertension: Secondary | ICD-10-CM | POA: Insufficient documentation

## 2021-04-11 DIAGNOSIS — Z794 Long term (current) use of insulin: Secondary | ICD-10-CM | POA: Diagnosis not present

## 2021-04-11 DIAGNOSIS — Z79899 Other long term (current) drug therapy: Secondary | ICD-10-CM | POA: Insufficient documentation

## 2021-04-11 DIAGNOSIS — E1142 Type 2 diabetes mellitus with diabetic polyneuropathy: Secondary | ICD-10-CM | POA: Insufficient documentation

## 2021-04-11 DIAGNOSIS — E039 Hypothyroidism, unspecified: Secondary | ICD-10-CM | POA: Diagnosis not present

## 2021-04-11 DIAGNOSIS — K7031 Alcoholic cirrhosis of liver with ascites: Secondary | ICD-10-CM | POA: Diagnosis not present

## 2021-04-11 DIAGNOSIS — I4892 Unspecified atrial flutter: Secondary | ICD-10-CM | POA: Diagnosis not present

## 2021-04-11 DIAGNOSIS — Z87891 Personal history of nicotine dependence: Secondary | ICD-10-CM | POA: Diagnosis not present

## 2021-04-11 LAB — COMPREHENSIVE METABOLIC PANEL
ALT: 17 U/L (ref 0–44)
AST: 38 U/L (ref 15–41)
Albumin: 2.3 g/dL — ABNORMAL LOW (ref 3.5–5.0)
Alkaline Phosphatase: 116 U/L (ref 38–126)
Anion gap: 9 (ref 5–15)
BUN: 10 mg/dL (ref 8–23)
CO2: 31 mmol/L (ref 22–32)
Calcium: 8.4 mg/dL — ABNORMAL LOW (ref 8.9–10.3)
Chloride: 93 mmol/L — ABNORMAL LOW (ref 98–111)
Creatinine, Ser: 0.56 mg/dL — ABNORMAL LOW (ref 0.61–1.24)
GFR, Estimated: >60 mL/min (ref >60)
Glucose, Bld: 173 mg/dL — ABNORMAL HIGH (ref 70–99)
Potassium: 4.8 mmol/L (ref 3.5–5.1)
Sodium: 133 mmol/L — ABNORMAL LOW (ref 135–145)
Total Bilirubin: 3.1 mg/dL — ABNORMAL HIGH (ref 0.3–1.2)
Total Protein: 6.4 g/dL — ABNORMAL LOW (ref 6.5–8.1)

## 2021-04-11 LAB — CBC
HCT: 40.4 % (ref 39.0–52.0)
Hemoglobin: 13.8 g/dL (ref 13.0–17.0)
MCH: 36.6 pg — ABNORMAL HIGH (ref 26.0–34.0)
MCHC: 34.2 g/dL (ref 30.0–36.0)
MCV: 107.2 fL — ABNORMAL HIGH (ref 80.0–100.0)
Platelets: 76 10*3/uL — ABNORMAL LOW (ref 150–400)
RBC: 3.77 MIL/uL — ABNORMAL LOW (ref 4.22–5.81)
RDW: 14.5 % (ref 11.5–15.5)
WBC: 5 10*3/uL (ref 4.0–10.5)
nRBC: 0 % (ref 0.0–0.2)

## 2021-04-11 LAB — AMMONIA: Ammonia: 80 umol/L — ABNORMAL HIGH (ref 9–35)

## 2021-04-11 LAB — LIPASE, BLOOD: Lipase: 32 U/L (ref 11–51)

## 2021-04-11 NOTE — ED Provider Notes (Signed)
[facility] Emergency Department Provider Note   ____________________________________________   Event Date/Time   First MD Initiated Contact with Patient 04/11/21 1339     (approximate)  I have reviewed the triage vital signs and the nursing notes.   HISTORY  Chief Complaint Cirrhosis and Shortness of Breath    HPI Lawrence Osborn is a 70 y.o. male with below stated past medical history and significant for cirrhosis who presents for abdominal distention and shortness of breath  LOCATION: Abdomen DURATION: 2 weeks TIMING: Worsening since onset SEVERITY: Severe QUALITY: Distention CONTEXT: Patient states that he has gained over 25 pounds in the last 2 weeks due to increasing fluid on his abdomen and lower extremities MODIFYING FACTORS: Patient currently taking diuretics and states that this has not improved his swelling.  Denies any other exacerbating or relieving factors ASSOCIATED SYMPTOMS: Dyspnea on exertion, lower extremity edema   Per medical record review, patient has needed paracentesis only once before          Past Medical History:  Diagnosis Date   Anemia    Cirrhosis of liver (HCC)    COPD (chronic obstructive pulmonary disease) (HCC)    Diabetes mellitus without complication (HCC)    DVT (deep venous thrombosis) (HCC)    mid calf to groin   GI bleed    Hypertension    Peritonitis (HCC)    Pneumothorax     Patient Active Problem List   Diagnosis Date Noted   Atrial fibrillation, chronic (HCC)    Alcoholic cirrhosis of liver without ascites (HCC)    Sepsis (HCC) 07/30/2019   Toe infection    Cellulitis of left lower extremity    Elevated lactic acid level    Chronic alcohol abuse 09/11/2018   Acute deep vein thrombosis (DVT) of proximal vein of right lower extremity (HCC)    Acute pulmonary embolism (HCC)    Thrombocytopenia (HCC)    Elevated d-dimer 08/31/2018   COPD (chronic obstructive pulmonary disease) (HCC)  08/30/2018   Alcohol use 08/30/2018   Hypotension 08/30/2018   CAD (coronary artery disease) 08/30/2018   PAF (paroxysmal atrial fibrillation) (HCC) 08/30/2018   Hypothyroidism 08/30/2018   Syncope 08/30/2018   Type 2 diabetes mellitus with peripheral neuropathy (HCC) 07/30/2018   H/O herpes zoster 07/26/2017   Lumbar disc disease 07/26/2017   Recurrent major depressive disorder, in partial remission (HCC) 07/16/2016   Thoracic radiculopathy 07/16/2016   Pain management contract agreement 03/26/2016   Benign essential tremor 08/21/2015   Chronic left-sided low back pain 08/21/2015   Depression 08/21/2015   Gastroesophageal reflux disease without esophagitis 08/21/2015    Past Surgical History:  Procedure Laterality Date   APPENDECTOMY     COLONOSCOPY     DORSAL SLIT N/A 09/20/2020   Procedure: DORSAL SLIT;  Surgeon: Riki Altes, MD;  Location: [facility] ORS;  Service: Urology;  Laterality: N/A;   HERNIA REPAIR      Prior to Admission medications   Medication Sig Start Date End Date Taking? Authorizing Provider  albuterol (PROVENTIL HFA;VENTOLIN HFA) 108 (90 Base) MCG/ACT inhaler Inhale 2 puffs into the lungs every 6 (six) hours as needed for wheezing or shortness of breath.    [provider]  apixaban (ELIQUIS) 5 MG TABS tablet Take 1 tablet (5 mg total) by mouth 2 (two) times daily. 08/03/19   Alford Highland, MD  Cyanocobalamin (B-12) 2500 MCG TABS Take 2,500 mcg by mouth daily.    [provider]  diltiazem (CARDIZEM  CD) 180 MG 24 hr capsule Take 180 mg by mouth at bedtime. 07/06/20   [provider]  ferrous gluconate (FERGON) 324 MG tablet Take 324 mg by mouth daily with breakfast.    [provider]  furosemide (LASIX) 20 MG tablet Take 20 mg by mouth daily as needed for edema. 07/25/20   [provider]  HYDROcodone-acetaminophen (NORCO/VICODIN) 5-325 MG tablet Take 1 tablet by mouth every 4 (four) hours as needed for moderate  pain. 09/20/20   Stoioff, Verna Czech, MD  insulin glargine (LANTUS) 100 UNIT/ML injection Inject 35 Units into the skin 2 (two) times daily.    [provider]  levothyroxine (SYNTHROID, LEVOTHROID) 100 MCG tablet Take 100 mcg by mouth daily before breakfast.    [provider]  nadolol (CORGARD) 40 MG tablet Take 2 tablets (80 mg total) by mouth daily. Patient taking differently: Take 40 mg by mouth daily. 08/03/19   Alford Highland, MD  omega-3 fish oil (MAXEPA) 1000 MG CAPS capsule Take 1,000 mg by mouth 2 (two) times daily.    [provider]  omeprazole (PRILOSEC) 20 MG capsule Take 20 mg by mouth daily.    [provider]  QUEtiapine (SEROQUEL) 100 MG tablet Take 100-200 mg by mouth See admin instructions. Take 100 mg in the morning and 200 mg at bedtime    [provider]  Semaglutide, 1 MG/DOSE, (OZEMPIC, 1 MG/DOSE,) 2 MG/1.5ML SOPN Inject 1 mg into the skin every Sunday.    [provider]  ST JOHNS WORT PO Take 2 tablets by mouth 2 (two) times daily.    [provider]  traMADol (ULTRAM) 50 MG tablet Take 50 mg by mouth 2 (two) times daily.    [provider]    Allergies Patient has no known allergies.  Family History  Problem Relation Age of Onset   Hypothyroidism Mother    CAD Father     Social History Social History   Tobacco Use   Smoking status: Former   Smokeless tobacco: Never  Substance Use Topics   Alcohol use: Yes    Alcohol/week: 22.0 standard drinks    Types: 1 Glasses of wine, 21 Cans of beer per week    Comment: 3 beers a day   Drug use: Not Currently    Review of Systems Constitutional: No fever/chills Eyes: No visual changes. ENT: No sore throat. Cardiovascular: Denies chest pain. Respiratory: Denies shortness of breath. Gastrointestinal: No abdominal pain but endorses distention.  No nausea, no vomiting.  No diarrhea. Genitourinary: Negative for dysuria. Musculoskeletal:  Negative for acute arthralgias Skin: Negative for rash.  Bilateral lower extremity edema Neurological: Negative for headaches, weakness/numbness/paresthesias in any extremity Psychiatric: Negative for suicidal ideation/homicidal ideation   ____________________________________________   PHYSICAL EXAM:  VITAL SIGNS: ED Triage Vitals [04/11/21 1231]  Enc Vitals Group     BP 94/69     Pulse Rate (!) 111     Resp (!) 22     Temp 98.1 F (36.7 C)     Temp Source Oral     SpO2 93 %     Weight 288 lb 1.6 oz (130.7 kg)     Height 5\' 8"  (1.727 m)     Head Circumference      Peak Flow      Pain Score 0     Pain Loc      Pain Edu?      Excl. in GC?    Constitutional: Alert and  oriented. Well appearing and in no acute distress. Eyes: Conjunctivae are normal. PERRL. Head: Atraumatic. Nose: No congestion/rhinnorhea. Mouth/Throat: Mucous membranes are moist. Neck: No stridor Cardiovascular: Grossly normal heart sounds.  Good peripheral circulation.  2+ bilateral lower extremity edema Respiratory: Normal respiratory effort.  No retractions. Gastrointestinal: Distended with positive fluid wave.  Nontender Musculoskeletal: No obvious deformities Neurologic:  Normal speech and language. No gross focal neurologic deficits are appreciated. Skin:  Skin is warm and dry. No rash noted. Psychiatric: Mood and affect are normal. Speech and behavior are normal.  ____________________________________________   LABS (all labs ordered are listed, but only abnormal results are displayed)  Labs Reviewed  COMPREHENSIVE METABOLIC PANEL - Abnormal; Notable for the following components:      Result Value   Sodium 133 (*)    Chloride 93 (*)    Glucose, Bld 173 (*)    Creatinine, Ser 0.56 (*)    Calcium 8.4 (*)    Total Protein 6.4 (*)    Albumin 2.3 (*)    Total Bilirubin 3.1 (*)    All other components within normal limits  CBC - Abnormal; Notable for the following components:   RBC 3.77 (*)     MCV 107.2 (*)    MCH 36.6 (*)    Platelets 76 (*)    All other components within normal limits  AMMONIA - Abnormal; Notable for the following components:   Ammonia 80 (*)    All other components within normal limits  LIPASE, BLOOD   ____________________________________________  EKG  ED ECG REPORT I, Merwyn Katos, the attending physician, personally viewed and interpreted this ECG.  Date: 04/11/2021 EKG Time: 1223 Rate: 111 Rhythm: Atrial flutter QRS Axis: normal Intervals: normal ST/T Wave abnormalities: normal Narrative Interpretation: Atrial flutter.  No evidence of acute ischemia  ____________________________________________  RADIOLOGY  ED MD interpretation: Chest x-ray shows small left effusion and left base atelectasis or infiltrate  Official radiology report(s): No results found.  ____________________________________________   PROCEDURES  Procedure(s) performed (including Critical Care):  .1-3 Lead EKG Interpretation  Date/Time: 04/13/2021 11:22 PM Performed by: Merwyn Katos, MD Authorized by: Merwyn Katos, MD     Interpretation: normal     ECG rate:  95   ECG rate assessment: normal     Rhythm: sinus rhythm     Ectopy: none     Conduction: normal     ____________________________________________   INITIAL IMPRESSION / ASSESSMENT AND PLAN / ED COURSE  As part of my medical decision making, I reviewed the following data within the electronic medical record, if available:  Nursing notes reviewed and incorporated, Labs reviewed, EKG interpreted, Old chart reviewed, Radiograph reviewed and Notes from prior ED visits reviewed and incorporated      ED Workup: CBC, BMP, LFTs, PT/INR, Ascitic Fluid studies (Cell count with differential, Gram stain, Glucose, Protein)  Given patient's history, exam there presentation is most consistent with abdominal pain from ascites volume burden. I have a low suspicion for Spontaneous Bacterial Peritonitis or  other abdominal emergency at this time. The patient had a significant amount of ascites in the abdominal cavity necessitating paracentesis.  ED Interventions: Paracentesis  Disposition: Discharge. Instructed regarding follow up within 24-48 hours with a primary care practitioner and strict return precautions.      ____________________________________________   FINAL CLINICAL IMPRESSION(S) / ED DIAGNOSES  Final diagnoses:  Ascites due to alcoholic cirrhosis (HCC)  SOB (shortness of breath)     ED Discharge Orders  None        Note:  This document was prepared using Dragon voice recognition software and may include unintentional dictation errors.    Merwyn KatosBradler, Araly Kaas K, MD 04/13/21 2322

## 2021-04-11 NOTE — ED Triage Notes (Signed)
Patient presents with extreme abdominal swelling/hardness and sob. Known hx cirrhosis. Reports 25lb weight gain in the last month

## 2021-04-11 NOTE — ED Notes (Signed)
Pt at [facility]

## 2021-04-11 NOTE — Procedures (Signed)
PROCEDURE SUMMARY:  Successful image-guided paracentesis from the right lateral abdomen.  Yielded 5.0 liters of clear yellow fluid.  No immediate complications.  EBL< 1 mL Patient tolerated well.   Specimen was sent for labs.  Please see imaging section of Epic for full dictation.  Villa Herb PA-C 04/11/2021 3:01 PM

## 2021-05-18 ENCOUNTER — Encounter: Payer: Self-pay | Admitting: Intensive Care

## 2021-05-18 ENCOUNTER — Emergency Department: Payer: Medicare Other

## 2021-05-18 ENCOUNTER — Inpatient Hospital Stay: Payer: Medicare Other

## 2021-05-18 ENCOUNTER — Inpatient Hospital Stay
Admission: EM | Admit: 2021-05-18 | Discharge: 2021-05-23 | DRG: 291 | Disposition: A | Discharge status: Home / Self Care | Payer: Medicare Other | Attending: Internal Medicine | Admitting: Internal Medicine

## 2021-05-18 DIAGNOSIS — I4892 Unspecified atrial flutter: Secondary | ICD-10-CM | POA: Diagnosis present

## 2021-05-18 DIAGNOSIS — I2510 Atherosclerotic heart disease of native coronary artery without angina pectoris: Secondary | ICD-10-CM | POA: Diagnosis present

## 2021-05-18 DIAGNOSIS — D696 Thrombocytopenia, unspecified: Secondary | ICD-10-CM | POA: Diagnosis not present

## 2021-05-18 DIAGNOSIS — M329 Systemic lupus erythematosus, unspecified: Secondary | ICD-10-CM | POA: Diagnosis present

## 2021-05-18 DIAGNOSIS — K7200 Acute and subacute hepatic failure without coma: Secondary | ICD-10-CM | POA: Diagnosis not present

## 2021-05-18 DIAGNOSIS — K219 Gastro-esophageal reflux disease without esophagitis: Secondary | ICD-10-CM | POA: Diagnosis present

## 2021-05-18 DIAGNOSIS — K7040 Alcoholic hepatic failure without coma: Secondary | ICD-10-CM | POA: Diagnosis present

## 2021-05-18 DIAGNOSIS — R188 Other ascites: Secondary | ICD-10-CM | POA: Diagnosis not present

## 2021-05-18 DIAGNOSIS — I480 Paroxysmal atrial fibrillation: Secondary | ICD-10-CM | POA: Diagnosis present

## 2021-05-18 DIAGNOSIS — K7031 Alcoholic cirrhosis of liver with ascites: Secondary | ICD-10-CM | POA: Diagnosis present

## 2021-05-18 DIAGNOSIS — I959 Hypotension, unspecified: Secondary | ICD-10-CM | POA: Diagnosis present

## 2021-05-18 DIAGNOSIS — R55 Syncope and collapse: Secondary | ICD-10-CM | POA: Diagnosis present

## 2021-05-18 DIAGNOSIS — E039 Hypothyroidism, unspecified: Secondary | ICD-10-CM | POA: Diagnosis present

## 2021-05-18 DIAGNOSIS — K7460 Unspecified cirrhosis of liver: Secondary | ICD-10-CM | POA: Diagnosis not present

## 2021-05-18 DIAGNOSIS — E860 Dehydration: Secondary | ICD-10-CM | POA: Diagnosis present

## 2021-05-18 DIAGNOSIS — Z87891 Personal history of nicotine dependence: Secondary | ICD-10-CM

## 2021-05-18 DIAGNOSIS — Z79899 Other long term (current) drug therapy: Secondary | ICD-10-CM

## 2021-05-18 DIAGNOSIS — R296 Repeated falls: Secondary | ICD-10-CM | POA: Diagnosis present

## 2021-05-18 DIAGNOSIS — F1010 Alcohol abuse, uncomplicated: Secondary | ICD-10-CM | POA: Diagnosis not present

## 2021-05-18 DIAGNOSIS — Z79890 Hormone replacement therapy (postmenopausal): Secondary | ICD-10-CM

## 2021-05-18 DIAGNOSIS — K7030 Alcoholic cirrhosis of liver without ascites: Secondary | ICD-10-CM | POA: Diagnosis present

## 2021-05-18 DIAGNOSIS — K7682 Hepatic encephalopathy: Secondary | ICD-10-CM | POA: Diagnosis present

## 2021-05-18 DIAGNOSIS — J9601 Acute respiratory failure with hypoxia: Principal | ICD-10-CM | POA: Diagnosis present

## 2021-05-18 DIAGNOSIS — I426 Alcoholic cardiomyopathy: Secondary | ICD-10-CM | POA: Diagnosis present

## 2021-05-18 DIAGNOSIS — Z7901 Long term (current) use of anticoagulants: Secondary | ICD-10-CM

## 2021-05-18 DIAGNOSIS — E872 Acidosis, unspecified: Secondary | ICD-10-CM

## 2021-05-18 DIAGNOSIS — E785 Hyperlipidemia, unspecified: Secondary | ICD-10-CM | POA: Diagnosis present

## 2021-05-18 DIAGNOSIS — I4891 Unspecified atrial fibrillation: Secondary | ICD-10-CM

## 2021-05-18 DIAGNOSIS — E1142 Type 2 diabetes mellitus with diabetic polyneuropathy: Secondary | ICD-10-CM | POA: Diagnosis present

## 2021-05-18 DIAGNOSIS — Z6841 Body Mass Index (BMI) 40.0 and over, adult: Secondary | ICD-10-CM

## 2021-05-18 DIAGNOSIS — R9431 Abnormal electrocardiogram [ECG] [EKG]: Secondary | ICD-10-CM | POA: Diagnosis present

## 2021-05-18 DIAGNOSIS — Z86711 Personal history of pulmonary embolism: Secondary | ICD-10-CM

## 2021-05-18 DIAGNOSIS — G8929 Other chronic pain: Secondary | ICD-10-CM | POA: Diagnosis present

## 2021-05-18 DIAGNOSIS — K7290 Hepatic failure, unspecified without coma: Secondary | ICD-10-CM | POA: Diagnosis not present

## 2021-05-18 DIAGNOSIS — I110 Hypertensive heart disease with heart failure: Secondary | ICD-10-CM | POA: Diagnosis present

## 2021-05-18 DIAGNOSIS — J449 Chronic obstructive pulmonary disease, unspecified: Secondary | ICD-10-CM | POA: Diagnosis present

## 2021-05-18 DIAGNOSIS — Z20822 Contact with and (suspected) exposure to covid-19: Secondary | ICD-10-CM | POA: Diagnosis present

## 2021-05-18 DIAGNOSIS — J9600 Acute respiratory failure, unspecified whether with hypoxia or hypercapnia: Secondary | ICD-10-CM | POA: Diagnosis present

## 2021-05-18 DIAGNOSIS — Z794 Long term (current) use of insulin: Secondary | ICD-10-CM

## 2021-05-18 DIAGNOSIS — Z86718 Personal history of other venous thrombosis and embolism: Secondary | ICD-10-CM

## 2021-05-18 DIAGNOSIS — I5023 Acute on chronic systolic (congestive) heart failure: Secondary | ICD-10-CM | POA: Diagnosis not present

## 2021-05-18 DIAGNOSIS — I5043 Acute on chronic combined systolic (congestive) and diastolic (congestive) heart failure: Secondary | ICD-10-CM | POA: Diagnosis present

## 2021-05-18 DIAGNOSIS — Z8249 Family history of ischemic heart disease and other diseases of the circulatory system: Secondary | ICD-10-CM

## 2021-05-18 DIAGNOSIS — I4510 Unspecified right bundle-branch block: Secondary | ICD-10-CM | POA: Diagnosis present

## 2021-05-18 DIAGNOSIS — D6959 Other secondary thrombocytopenia: Secondary | ICD-10-CM | POA: Diagnosis present

## 2021-05-18 DIAGNOSIS — I420 Dilated cardiomyopathy: Secondary | ICD-10-CM | POA: Diagnosis not present

## 2021-05-18 DIAGNOSIS — I483 Typical atrial flutter: Secondary | ICD-10-CM | POA: Diagnosis not present

## 2021-05-18 LAB — URINALYSIS, COMPLETE (UACMP) WITH MICROSCOPIC
Bacteria, UA: NONE SEEN
Bilirubin Urine: NEGATIVE
Glucose, UA: NEGATIVE mg/dL
Hgb urine dipstick: NEGATIVE
Ketones, ur: NEGATIVE mg/dL
Leukocytes,Ua: NEGATIVE
Nitrite: NEGATIVE
Protein, ur: NEGATIVE mg/dL
Specific Gravity, Urine: 1.038 — ABNORMAL HIGH (ref 1.005–1.030)
pH: 7 (ref 5.0–8.0)

## 2021-05-18 LAB — BLOOD GAS, VENOUS
Acid-Base Excess: 8.1 mmol/L — ABNORMAL HIGH (ref 0.0–2.0)
Bicarbonate: 35.5 mmol/L — ABNORMAL HIGH (ref 20.0–28.0)
O2 Saturation: 67.6 %
Patient temperature: 37
pCO2, Ven: 60 mmHg (ref 44.0–60.0)
pH, Ven: 7.38 (ref 7.250–7.430)
pO2, Ven: 36 mmHg (ref 32.0–45.0)

## 2021-05-18 LAB — SODIUM, URINE, RANDOM: Sodium, Ur: 90 mmol/L

## 2021-05-18 LAB — RESP PANEL BY RT-PCR (FLU A&B, COVID) ARPGX2
Influenza A by PCR: NEGATIVE
Influenza B by PCR: NEGATIVE
SARS Coronavirus 2 by RT PCR: NEGATIVE

## 2021-05-18 LAB — CBC
HCT: 42.1 % (ref 39.0–52.0)
Hemoglobin: 15.2 g/dL (ref 13.0–17.0)
MCH: 36.4 pg — ABNORMAL HIGH (ref 26.0–34.0)
MCHC: 36.1 g/dL — ABNORMAL HIGH (ref 30.0–36.0)
MCV: 100.7 fL — ABNORMAL HIGH (ref 80.0–100.0)
Platelets: 94 10*3/uL — ABNORMAL LOW (ref 150–400)
RBC: 4.18 MIL/uL — ABNORMAL LOW (ref 4.22–5.81)
RDW: 13.8 % (ref 11.5–15.5)
WBC: 5.5 10*3/uL (ref 4.0–10.5)
nRBC: 0 % (ref 0.0–0.2)

## 2021-05-18 LAB — CBG MONITORING, ED: Glucose-Capillary: 96 mg/dL (ref 70–99)

## 2021-05-18 LAB — BASIC METABOLIC PANEL
Anion gap: 7 (ref 5–15)
BUN: 8 mg/dL (ref 8–23)
CO2: 31 mmol/L (ref 22–32)
Calcium: 8.8 mg/dL — ABNORMAL LOW (ref 8.9–10.3)
Chloride: 95 mmol/L — ABNORMAL LOW (ref 98–111)
Creatinine, Ser: 0.82 mg/dL (ref 0.61–1.24)
GFR, Estimated: >60 mL/min (ref >60)
Glucose, Bld: 116 mg/dL — ABNORMAL HIGH (ref 70–99)
Potassium: 4.4 mmol/L (ref 3.5–5.1)
Sodium: 133 mmol/L — ABNORMAL LOW (ref 135–145)

## 2021-05-18 LAB — URINE DRUG SCREEN, QUALITATIVE (ARMC ONLY)
Amphetamines, Ur Screen: NOT DETECTED
Barbiturates, Ur Screen: NOT DETECTED
Benzodiazepine, Ur Scrn: NOT DETECTED
Cannabinoid 50 Ng, Ur ~~LOC~~: NOT DETECTED
Cocaine Metabolite,Ur ~~LOC~~: NOT DETECTED
MDMA (Ecstasy)Ur Screen: NOT DETECTED
Methadone Scn, Ur: NOT DETECTED
Opiate, Ur Screen: NOT DETECTED
Phencyclidine (PCP) Ur S: NOT DETECTED
Tricyclic, Ur Screen: POSITIVE — AB

## 2021-05-18 LAB — MAGNESIUM: Magnesium: 1.8 mg/dL (ref 1.7–2.4)

## 2021-05-18 LAB — HEPATIC FUNCTION PANEL
ALT: 20 U/L (ref 0–44)
AST: 50 U/L — ABNORMAL HIGH (ref 15–41)
Albumin: 2.5 g/dL — ABNORMAL LOW (ref 3.5–5.0)
Alkaline Phosphatase: 141 U/L — ABNORMAL HIGH (ref 38–126)
Bilirubin, Direct: 1.1 mg/dL — ABNORMAL HIGH (ref 0.0–0.2)
Indirect Bilirubin: 2.4 mg/dL — ABNORMAL HIGH (ref 0.3–0.9)
Total Bilirubin: 3.5 mg/dL — ABNORMAL HIGH (ref 0.3–1.2)
Total Protein: 6.8 g/dL (ref 6.5–8.1)

## 2021-05-18 LAB — TROPONIN I (HIGH SENSITIVITY)
Troponin I (High Sensitivity): 10 ng/L (ref <18)
Troponin I (High Sensitivity): 9 ng/L (ref <18)

## 2021-05-18 LAB — CK: Total CK: 46 U/L — ABNORMAL LOW (ref 49–397)

## 2021-05-18 LAB — LACTIC ACID, PLASMA
Lactic Acid, Venous: 2.6 mmol/L (ref 0.5–1.9)
Lactic Acid, Venous: 2.8 mmol/L (ref 0.5–1.9)
Lactic Acid, Venous: 2.8 mmol/L (ref 0.5–1.9)

## 2021-05-18 LAB — ETHANOL: Alcohol, Ethyl (B): <10 mg/dL (ref <10)

## 2021-05-18 LAB — PHOSPHORUS: Phosphorus: 4 mg/dL (ref 2.5–4.6)

## 2021-05-18 LAB — BRAIN NATRIURETIC PEPTIDE: B Natriuretic Peptide: 318.3 pg/mL — ABNORMAL HIGH (ref 0.0–100.0)

## 2021-05-18 LAB — AMMONIA: Ammonia: 97 umol/L — ABNORMAL HIGH (ref 9–35)

## 2021-05-18 LAB — CREATININE, URINE, RANDOM: Creatinine, Urine: 39 mg/dL

## 2021-05-18 LAB — PROTIME-INR
INR: 1.7 — ABNORMAL HIGH (ref 0.8–1.2)
Prothrombin Time: 19.5 seconds — ABNORMAL HIGH (ref 11.4–15.2)

## 2021-05-18 LAB — OSMOLALITY, URINE: Osmolality, Ur: 375 mOsm/kg (ref 300–900)

## 2021-05-18 MED ORDER — IOHEXOL 350 MG/ML SOLN
100.0000 mL | Freq: Once | INTRAVENOUS | Status: AC | PRN
  Administered 2021-05-18: 100 mL via INTRAVENOUS

## 2021-05-18 MED ORDER — THIAMINE HCL 100 MG/ML IJ SOLN
100.0000 mg | Freq: Every day | INTRAMUSCULAR | Status: DC
  Administered 2021-05-21: 100 mg via INTRAVENOUS
  Filled 2021-05-18 (×2): qty 2

## 2021-05-18 MED ORDER — ALBUMIN HUMAN 25 % IV SOLN
12.5000 g | Freq: Once | INTRAVENOUS | Status: AC
  Administered 2021-05-18: 12.5 g via INTRAVENOUS
  Filled 2021-05-18: qty 50

## 2021-05-18 MED ORDER — LORAZEPAM 2 MG PO TABS: 0.0000 mg | ORAL_TABLET | Freq: Two times a day (BID) | ORAL | Status: DC

## 2021-05-18 MED ORDER — THIAMINE HCL 100 MG PO TABS
100.0000 mg | ORAL_TABLET | Freq: Every day | ORAL | Status: DC
  Administered 2021-05-18 – 2021-05-23 (×5): 100 mg via ORAL
  Filled 2021-05-18 (×6): qty 1

## 2021-05-18 MED ORDER — LORAZEPAM 2 MG/ML IJ SOLN: 0.0000 mg | Freq: Two times a day (BID) | INTRAMUSCULAR | Status: DC

## 2021-05-18 MED ORDER — INSULIN ASPART 100 UNIT/ML IJ SOLN
0.0000 [IU] | INTRAMUSCULAR | Status: DC
  Administered 2021-05-20: 1 [IU] via SUBCUTANEOUS
  Administered 2021-05-20: 2 [IU] via SUBCUTANEOUS
  Filled 2021-05-18 (×3): qty 1

## 2021-05-18 MED ORDER — LORAZEPAM 2 MG PO TABS: 0.0000 mg | ORAL_TABLET | Freq: Four times a day (QID) | ORAL | Status: DC

## 2021-05-18 MED ORDER — LORAZEPAM 2 MG/ML IJ SOLN: 0.0000 mg | Freq: Four times a day (QID) | INTRAMUSCULAR | Status: DC

## 2021-05-18 MED ORDER — LACTATED RINGERS IV BOLUS
1000.0000 mL | Freq: Once | INTRAVENOUS | Status: AC
  Administered 2021-05-18: 1000 mL via INTRAVENOUS

## 2021-05-18 NOTE — ED Provider Notes (Signed)
[facility] Emergency Department Provider Note  ____________________________________________   Event Date/Time   First MD Initiated Contact with Patient 05/18/21 1543     (approximate)  I have reviewed the triage vital signs and the nursing notes.   HISTORY  Chief Complaint Loss of Consciousness   HPI Lawrence Osborn is a 70 y.o. male with past no history of anemia, COPD, DM, HTN, alcoholic cirrhosis currently on Lasix and spironolactone daily still drinking approximately 2 alcoholic beverages per day, CAD, PAF on Eliquis who presents for assessment after he had a syncopal episode earlier today.  Patient states he passed out after feeling dizzy when getting out of his car.  He states he hit the front of his head on some concrete.  He was told by his wife he was out for about 30 seconds.  He initially did not want come to the hospital but was still very unsteady when he got home on his feet feeling dizzy and was convinced by his wife to seek evaluation.  He states he typically drinks about 2 beers per day and has not had any today but otherwise has been in usual state of health without any recent fevers, chills, cough, nausea, vomiting, diarrhea, dysuria, chest pain or other recent passing out episodes.  States he did not have much to eat or drink this morning but otherwise denies any acute physical complaints or concerns.         Past Medical History:  Diagnosis Date   Anemia    Cirrhosis of liver ([place])    COPD (chronic obstructive pulmonary disease) ([place])    Diabetes mellitus without complication (HCC)    DVT (deep venous thrombosis) (HCC)    mid calf to groin   GI bleed    Hypertension    Peritonitis ([place])    Pneumothorax     Patient Active Problem List   Diagnosis Date Noted   Acute respiratory failure ([place]) 05/18/2021   Atrial fibrillation, chronic (HCC)    Alcoholic cirrhosis of liver without ascites ([place])    Sepsis ([place]) 07/30/2019   Toe  infection    Cellulitis of left lower extremity    Elevated lactic acid level    Chronic alcohol abuse 09/11/2018   Acute deep vein thrombosis (DVT) of proximal vein of right lower extremity (HCC)    Acute pulmonary embolism (HCC)    Thrombocytopenia (HCC)    Elevated d-dimer 08/31/2018   COPD (chronic obstructive pulmonary disease) ([place]) 08/30/2018   Alcohol use 08/30/2018   Hypotension 08/30/2018   CAD (coronary artery disease) 08/30/2018   PAF (paroxysmal atrial fibrillation) ([place]) 08/30/2018   Hypothyroidism 08/30/2018   Syncope 08/30/2018   Type 2 diabetes mellitus with peripheral neuropathy ([place]) 07/30/2018   H/O herpes zoster 07/26/2017   Lumbar disc disease 07/26/2017   Recurrent major depressive disorder, in partial remission ([place]) 07/16/2016   Thoracic radiculopathy 07/16/2016   Pain management contract agreement 03/26/2016   Benign essential tremor 08/21/2015   Chronic left-sided low back pain 08/21/2015   Depression 08/21/2015   Gastroesophageal reflux disease without esophagitis 08/21/2015    Past Surgical History:  Procedure Laterality Date   APPENDECTOMY     COLONOSCOPY     DORSAL SLIT N/A 09/20/2020   Procedure: DORSAL SLIT;  Surgeon: Abbie Sons, MD;  Location: [facility] ORS;  Service: Urology;  Laterality: N/A;   HERNIA REPAIR      Prior to Admission medications   Medication Sig Start Date End Date Taking?  Authorizing Provider  albuterol (PROVENTIL HFA;VENTOLIN HFA) 108 (90 Base) MCG/ACT inhaler Inhale 2 puffs into the lungs every 6 (six) hours as needed for wheezing or shortness of breath.   Yes [provider]  apixaban (ELIQUIS) 5 MG TABS tablet Take 1 tablet (5 mg total) by mouth 2 (two) times daily. 08/03/19  Yes Wieting, Richard, MD  Cyanocobalamin (B-12) 2500 MCG TABS Take 2,500 mcg by mouth daily.   Yes [provider]  ferrous gluconate (FERGON) 324 MG tablet Take 324 mg by mouth daily with breakfast.   Yes [provider]   furosemide (LASIX) 20 MG tablet Take 20 mg by mouth daily as needed for edema. 07/25/20  Yes [provider]  insulin glargine (LANTUS) 100 UNIT/ML injection Inject 35 Units into the skin 2 (two) times daily.   Yes [provider]  lactulose (CHRONULAC) 10 GM/15ML solution Take 20 g by mouth 2 (two) times daily. 04/18/21  Yes [provider]  levothyroxine (SYNTHROID, LEVOTHROID) 100 MCG tablet Take 100 mcg by mouth daily before breakfast.   Yes [provider]  nadolol (CORGARD) 40 MG tablet Take 2 tablets (80 mg total) by mouth daily. Patient taking differently: Take 40 mg by mouth daily. 08/03/19  Yes Wieting, Richard, MD  omega-3 fish oil (MAXEPA) 1000 MG CAPS capsule Take 1,000 mg by mouth 2 (two) times daily.   Yes [provider]  omeprazole (PRILOSEC) 20 MG capsule Take 20 mg by mouth daily.   Yes [provider]  QUEtiapine (SEROQUEL) 100 MG tablet Take 100-200 mg by mouth See admin instructions. Take 100 mg in the morning and 200 mg at bedtime   Yes [provider]  spironolactone (ALDACTONE) 50 MG tablet Take 50 mg by mouth daily. 04/18/21  Yes [provider]  traMADol (ULTRAM) 50 MG tablet Take 50 mg by mouth 2 (two) times daily.   Yes [provider]  diltiazem (CARDIZEM CD) 180 MG 24 hr capsule Take 180 mg by mouth at bedtime. Patient not taking: Reported on 05/18/2021 07/06/20   [provider]  HYDROcodone-acetaminophen (NORCO/VICODIN) 5-325 MG tablet Take 1 tablet by mouth every 4 (four) hours as needed for moderate pain. Patient not taking: No sig reported 09/20/20   Abbie Sons, MD  Semaglutide, 1 MG/DOSE, (OZEMPIC, 1 MG/DOSE,) 2 MG/1.5ML SOPN Inject 1 mg into the skin every Sunday. Patient not taking: Reported on 05/18/2021    [provider]  ST JOHNS WORT PO Take 2 tablets by mouth 2 (two) times daily. Patient not taking: Reported on 05/18/2021    [provider]     Allergies Patient has no known allergies.  Family History  Problem Relation Age of Onset   Hypothyroidism Mother    CAD Father     Social History Social History   Tobacco Use   Smoking status: Former   Smokeless tobacco: Never  Substance Use Topics   Alcohol use: Yes    Alcohol/week: 22.0 standard drinks    Types: 1 Glasses of wine, 21 Cans of beer per week    Comment: 3 beers a day   Drug use: Not Currently    Review of Systems  Review of Systems  Constitutional:  Negative for chills and fever.  HENT:  Negative for sore throat.   Eyes:  Negative for pain.  Respiratory:  Negative for cough and stridor.   Cardiovascular:  Negative for chest pain.  Gastrointestinal:  Positive for diarrhea (chronic). Negative for vomiting.  Genitourinary:  Negative for dysuria.  Musculoskeletal:  Negative for myalgias.  Skin:  Negative for rash.  Neurological:  Positive for dizziness, loss of consciousness and headaches. Negative for seizures.  Psychiatric/Behavioral:  Negative for suicidal ideas.   All other systems reviewed and are negative.    ____________________________________________   PHYSICAL EXAM:  VITAL SIGNS: ED Triage Vitals  Enc Vitals Group     BP 05/18/21 1535 (!) 77/55     Pulse Rate 05/18/21 1535 (!) 117     Resp 05/18/21 1535 18     Temp 05/18/21 1535 (!) 97.5 F (36.4 C)     Temp Source 05/18/21 1535 Oral     SpO2 05/18/21 1535 92 %     Weight 05/18/21 1538 274 lb (124.3 kg)     Height 05/18/21 1538 '5\' 8"'  (1.727 m)     Head Circumference --      Peak Flow --      Pain Score 05/18/21 1538 0     Pain Loc --      Pain Edu? --      Excl. in [place]? --    Vitals:   05/18/21 1900 05/18/21 1900  BP: 98/78 103/84  Pulse: (!) 114 (!) 113  Resp: (!) 25   Temp:    SpO2: 95%    Physical Exam Vitals and nursing note reviewed.  Constitutional:      Appearance: He is well-developed. He is obese.  HENT:     Head: Normocephalic.     Right Ear: External  ear normal.     Left Ear: External ear normal.     Nose: Nose normal.     Mouth/Throat:     Mouth: Mucous membranes are dry.  Eyes:     Conjunctiva/sclera: Conjunctivae normal.  Cardiovascular:     Rate and Rhythm: Normal rate and regular rhythm.     Heart sounds: No murmur heard. Pulmonary:     Effort: Pulmonary effort is normal. No respiratory distress.     Breath sounds: Normal breath sounds.  Abdominal:     General: There is distension.     Palpations: Abdomen is soft.     Tenderness: There is no abdominal tenderness.  Musculoskeletal:     Cervical back: Neck supple.  Skin:    General: Skin is warm and dry.     Capillary Refill: Capillary refill takes 2 to 3 seconds.  Neurological:     Mental Status: He is alert and oriented to person, place, and time.  Psychiatric:        Mood and Affect: Mood normal.    Small superficial abrasion over the left forehead.  Mild tremor in the bile upper extremities.  No pronator drift.  No significant finger dysmetria.  Patient has symmetric strength in his upper and lower extremities.  2+ radial pulses.  No tenderness step-offs or deformities over the C/T/L-spine.  Cranial nerves II through XII grossly intact.  No other significant trauma to the extremities back chest or abdomen.   ____________________________________________   LABS (all labs ordered are listed, but only abnormal results are displayed)  Labs Reviewed  BASIC METABOLIC PANEL - Abnormal; Notable for the following components:      Result Value   Sodium 133 (*)    Chloride 95 (*)    Glucose, Bld 116 (*)    Calcium 8.8 (*)    All other components within normal limits  CBC - Abnormal; Notable for the following components:   RBC 4.18 (*)  MCV 100.7 (*)    MCH 36.4 (*)    MCHC 36.1 (*)    Platelets 94 (*)    All other components within normal limits  HEPATIC FUNCTION PANEL - Abnormal; Notable for the following components:   Albumin 2.5 (*)    AST 50 (*)     Alkaline Phosphatase 141 (*)    Total Bilirubin 3.5 (*)    Bilirubin, Direct 1.1 (*)    Indirect Bilirubin 2.4 (*)    All other components within normal limits  LACTIC ACID, PLASMA - Abnormal; Notable for the following components:   Lactic Acid, Venous 2.6 (*)    All other components within normal limits  LACTIC ACID, PLASMA - Abnormal; Notable for the following components:   Lactic Acid, Venous 2.8 (*)    All other components within normal limits  AMMONIA - Abnormal; Notable for the following components:   Ammonia 97 (*)    All other components within normal limits  PROTIME-INR - Abnormal; Notable for the following components:   Prothrombin Time 19.5 (*)    INR 1.7 (*)    All other components within normal limits  BRAIN NATRIURETIC PEPTIDE - Abnormal; Notable for the following components:   B Natriuretic Peptide 318.3 (*)    All other components within normal limits  RESP PANEL BY RT-PCR (FLU A&B, COVID) ARPGX2  CULTURE, BLOOD (SINGLE)  MAGNESIUM  ETHANOL  URINALYSIS, COMPLETE (UACMP) WITH MICROSCOPIC  CBG MONITORING, ED  TROPONIN I (HIGH SENSITIVITY)  TROPONIN I (HIGH SENSITIVITY)   ____________________________________________  EKG  Sinus tachycardia with ventricular rate of 111, QT QRS interval of 203 and a QTc interval of 620 with some nonspecific changes throughout. ____________________________________________  RADIOLOGY  ED MD interpretation: Chest x-ray shows small left pleural effusion and atelectasis without rib fracture, pneumothorax, focal consolidation or other clear acute thoracic process. CT head shows no evidence of skull fracture, intracranial hemorrhage or other acute cranial process.  CT C-spine shows no acute C-spine fracture.  CT chest abdomen pelvis shows no evidence of PE, dissection, aneurysm but is remarkable for small to moderate left pleural effusion, cirrhosis with portal hypertension and cholelithiasis without evidence of cholecystitis.  No trauma to  the T or L-spine.  No other acute abdominopelvic processes noted.  Official radiology report(s): DG Chest 2 View  Result Date: 05/18/2021 CLINICAL DATA:  Syncope. EXAM: CHEST - 2 VIEW COMPARISON:  April 11, 2021. FINDINGS: The heart size and mediastinal contours are within normal limits. No pneumothorax is noted. Right lung is clear. Small left pleural effusion is noted with adjacent left basilar atelectasis. The visualized skeletal structures are unremarkable. IMPRESSION: Small left pleural effusion is noted with adjacent left basilar atelectasis. Aortic Atherosclerosis (ICD10-I70.0). Electronically Signed   By: Marijo Conception M.D.   On: 05/18/2021 16:55   CT HEAD WO CONTRAST  Result Date: 05/18/2021 CLINICAL DATA:  Loss of consciousness after fall. Status post fall. Nonlocalized acute abdominal pain. EXAM: CT HEAD WITHOUT CONTRAST CT CERVICAL SPINE WITHOUT CONTRAST CT CHEST, ABDOMEN AND PELVIS WITH CONTRAST TECHNIQUE: Contiguous axial images were obtained from the base of the skull through the vertex without intravenous contrast. Multidetector CT imaging of the cervical spine was performed without intravenous contrast. Multiplanar CT image reconstructions were also generated. Multidetector CT imaging of the chest, abdomen and pelvis was performed following the standard protocol during bolus administration of intravenous contrast. CONTRAST:  173m OMNIPAQUE IOHEXOL 350 MG/ML SOLN COMPARISON:  None. FINDINGS: CT HEAD FINDINGS BRAIN: BRAIN Cerebral ventricle  sizes are concordant with the degree of cerebral volume loss. Patchy and confluent areas of decreased attenuation are noted throughout the deep and periventricular white matter of the cerebral hemispheres bilaterally, compatible with chronic microvascular ischemic disease. No evidence of large-territorial acute infarction. No parenchymal hemorrhage. No mass lesion. No extra-axial collection. No mass effect or midline shift. No hydrocephalus. Basilar  cisterns are patent. Vascular: No hyperdense vessel. Atherosclerotic calcifications are present within the cavernous internal carotid arteries. Skull: No acute fracture or focal lesion. Sinuses/Orbits: Paranasal sinuses and mastoid air cells are clear. The orbits are unremarkable. Other: None. CT CERVICAL FINDINGS Alignment: Straightening of the normal cervical lordosis likely due to positioning and degenerative changes. Alignment is otherwise normal. Skull base and vertebrae: Moderate to severe multilevel degenerative changes of the spine. Associated multilevel severe osseous neural foraminal stenosis. No severe osseous central canal stenosis. No acute fracture. No aggressive appearing focal osseous lesion or focal pathologic process. Soft tissues and spinal canal: No prevertebral fluid or swelling. No visible canal hematoma. Upper chest: At least moderate volume left pleural effusion. No pneumothorax. Other: None. CHEST: Ports and Devices: None. Lungs/airways: Nonspecific thin walled cystic change of the right apex. Couple scattered pulmonary micronodules. Passive atelectasis of the left lower lobe. Passive atelectasis of the lingula. No focal consolidation. No pulmonary mass. No pulmonary contusion or laceration. No pneumatocele formation. The central airways are patent. Pleura: Small to moderate volume left pleural effusion no right pleural effusion. No pneumothorax. No hemothorax. Lymph Nodes: No mediastinal, hilar, or axillary lymphadenopathy. Mediastinum: No pneumomediastinum. No aortic injury or mediastinal hematoma. The thoracic aorta is normal in caliber. Severe calcified and noncalcified atherosclerotic plaque of the thoracic aorta. At least three-vessel coronary calcifications. Mild aortic valve leaflet calcifications. The heart is normal in size. No significant pericardial effusion. The main pulmonary artery is normal in caliber. No pulmonary embolus. The esophagus is unremarkable. The thyroid is  unremarkable. Chest Wall / Breasts: No chest wall mass. Musculoskeletal: No acute rib or sternal fracture. No spinal fracture. ABDOMEN / PELVIS: Liver: Not enlarged. Nodular hepatic contour. No focal lesion. No laceration or subcapsular hematoma. Biliary System: Layering hyperdensity within gallbladder lumen consistent with radio-opaque gallstones. No biliary ductal dilatation. Pancreas: Normal pancreatic contour. No main pancreatic duct dilatation. Spleen: Enlarged in caliber measuring up to at least 18 cm. No focal lesion. No laceration, subcapsular hematoma, or vascular injury. Adrenal Glands: No nodularity bilaterally. Kidneys: Bilateral kidneys enhance symmetrically. No hydronephrosis. No contusion, laceration, or subcapsular hematoma. Exophytic 1.7 cm fluid density lesion within the right kidney likely represents a simple renal cyst. Similar finding on the left measuring up to 1.8 cm. No injury to the vascular structures or collecting systems. No hydroureter. The urinary bladder is unremarkable. Bowel: No small or large bowel wall thickening or dilatation. The appendix is not definitely identified. Mesentery, Omentum, and Peritoneum: Small to moderate volume simple free fluid ascites. No pneumoperitoneum. No hemoperitoneum. No mesenteric hematoma identified. No organized fluid collection. Pelvic Organs: Normal. Lymph Nodes: No abdominal, pelvic, inguinal lymphadenopathy. Vasculature: Recanalized paraumbilical vein. Upper abdominal venous collaterals. Small esophageal varices. The main portal, splenic, superior mesenteric veins are patent. No abdominal aorta or iliac aneurysm. No active contrast extravasation or pseudoaneurysm. Musculoskeletal: No significant soft tissue hematoma. No acute pelvic fracture. No spinal fracture. IMPRESSION: 1. No acute intracranial abnormality. 2. No acute displaced fracture or traumatic listhesis of the cervical spine in a patient with multilevel severe osseous neural foraminal  stenosis due to degenerative changes. 3.  No acute  traumatic injury to the chest, abdomen, or pelvis. 4. No acute fracture or traumatic malalignment of the thoracic or lumbar spine. Other imaging findings of potential clinical significance: 1. No pulmonary embolus. 2. Small to moderate volume left pleural effusion. 3. Cirrhosis with portal hypertension. No focal hepatic lesion on this single-phase portal venous study. Recommend nonemergent MRI liver protocol for further evaluation. When the patient is clinically stable and able to follow directions and hold their breath (preferably as an outpatient) further evaluation with dedicated abdominal MRI should be considered. 4. Cholelithiasis no findings of acute cholecystitis. 5. Aortic Atherosclerosis (ICD10-I70.0). Including at least 3 vessel coronary artery calcifications as well as aortic valve leaflet calcifications. Correlate with aortic stenosis. Electronically Signed   By: Iven Finn M.D.   On: 05/18/2021 18:37   CT Angio Chest PE W and/or Wo Contrast  Result Date: 05/18/2021 CLINICAL DATA:  Loss of consciousness after fall. Status post fall. Nonlocalized acute abdominal pain. EXAM: CT HEAD WITHOUT CONTRAST CT CERVICAL SPINE WITHOUT CONTRAST CT CHEST, ABDOMEN AND PELVIS WITH CONTRAST TECHNIQUE: Contiguous axial images were obtained from the base of the skull through the vertex without intravenous contrast. Multidetector CT imaging of the cervical spine was performed without intravenous contrast. Multiplanar CT image reconstructions were also generated. Multidetector CT imaging of the chest, abdomen and pelvis was performed following the standard protocol during bolus administration of intravenous contrast. CONTRAST:  114m OMNIPAQUE IOHEXOL 350 MG/ML SOLN COMPARISON:  None. FINDINGS: CT HEAD FINDINGS BRAIN: BRAIN Cerebral ventricle sizes are concordant with the degree of cerebral volume loss. Patchy and confluent areas of decreased attenuation are noted  throughout the deep and periventricular white matter of the cerebral hemispheres bilaterally, compatible with chronic microvascular ischemic disease. No evidence of large-territorial acute infarction. No parenchymal hemorrhage. No mass lesion. No extra-axial collection. No mass effect or midline shift. No hydrocephalus. Basilar cisterns are patent. Vascular: No hyperdense vessel. Atherosclerotic calcifications are present within the cavernous internal carotid arteries. Skull: No acute fracture or focal lesion. Sinuses/Orbits: Paranasal sinuses and mastoid air cells are clear. The orbits are unremarkable. Other: None. CT CERVICAL FINDINGS Alignment: Straightening of the normal cervical lordosis likely due to positioning and degenerative changes. Alignment is otherwise normal. Skull base and vertebrae: Moderate to severe multilevel degenerative changes of the spine. Associated multilevel severe osseous neural foraminal stenosis. No severe osseous central canal stenosis. No acute fracture. No aggressive appearing focal osseous lesion or focal pathologic process. Soft tissues and spinal canal: No prevertebral fluid or swelling. No visible canal hematoma. Upper chest: At least moderate volume left pleural effusion. No pneumothorax. Other: None. CHEST: Ports and Devices: None. Lungs/airways: Nonspecific thin walled cystic change of the right apex. Couple scattered pulmonary micronodules. Passive atelectasis of the left lower lobe. Passive atelectasis of the lingula. No focal consolidation. No pulmonary mass. No pulmonary contusion or laceration. No pneumatocele formation. The central airways are patent. Pleura: Small to moderate volume left pleural effusion no right pleural effusion. No pneumothorax. No hemothorax. Lymph Nodes: No mediastinal, hilar, or axillary lymphadenopathy. Mediastinum: No pneumomediastinum. No aortic injury or mediastinal hematoma. The thoracic aorta is normal in caliber. Severe calcified and  noncalcified atherosclerotic plaque of the thoracic aorta. At least three-vessel coronary calcifications. Mild aortic valve leaflet calcifications. The heart is normal in size. No significant pericardial effusion. The main pulmonary artery is normal in caliber. No pulmonary embolus. The esophagus is unremarkable. The thyroid is unremarkable. Chest Wall / Breasts: No chest wall mass. Musculoskeletal: No acute rib or sternal  fracture. No spinal fracture. ABDOMEN / PELVIS: Liver: Not enlarged. Nodular hepatic contour. No focal lesion. No laceration or subcapsular hematoma. Biliary System: Layering hyperdensity within gallbladder lumen consistent with radio-opaque gallstones. No biliary ductal dilatation. Pancreas: Normal pancreatic contour. No main pancreatic duct dilatation. Spleen: Enlarged in caliber measuring up to at least 18 cm. No focal lesion. No laceration, subcapsular hematoma, or vascular injury. Adrenal Glands: No nodularity bilaterally. Kidneys: Bilateral kidneys enhance symmetrically. No hydronephrosis. No contusion, laceration, or subcapsular hematoma. Exophytic 1.7 cm fluid density lesion within the right kidney likely represents a simple renal cyst. Similar finding on the left measuring up to 1.8 cm. No injury to the vascular structures or collecting systems. No hydroureter. The urinary bladder is unremarkable. Bowel: No small or large bowel wall thickening or dilatation. The appendix is not definitely identified. Mesentery, Omentum, and Peritoneum: Small to moderate volume simple free fluid ascites. No pneumoperitoneum. No hemoperitoneum. No mesenteric hematoma identified. No organized fluid collection. Pelvic Organs: Normal. Lymph Nodes: No abdominal, pelvic, inguinal lymphadenopathy. Vasculature: Recanalized paraumbilical vein. Upper abdominal venous collaterals. Small esophageal varices. The main portal, splenic, superior mesenteric veins are patent. No abdominal aorta or iliac aneurysm. No active  contrast extravasation or pseudoaneurysm. Musculoskeletal: No significant soft tissue hematoma. No acute pelvic fracture. No spinal fracture. IMPRESSION: 1. No acute intracranial abnormality. 2. 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 3.  No acute traumatic injury to the chest, abdomen, or pelvis. 4. No acute fracture or traumatic malalignment of the thoracic or lumbar spine. Other imaging findings of potential clinical significance: 1. No pulmonary embolus. 2. Small to moderate volume left pleural effusion. 3. Cirrhosis with portal hypertension. No focal hepatic lesion on this single-phase portal venous study. Recommend nonemergent MRI liver protocol for further evaluation. When the patient is clinically stable and able to follow directions and hold their breath (preferably as an outpatient) further evaluation with dedicated abdominal MRI should be considered. 4. Cholelithiasis no findings of acute cholecystitis. 5. Aortic Atherosclerosis (ICD10-I70.0). Including at least 3 vessel coronary artery calcifications as well as aortic valve leaflet calcifications. Correlate with aortic stenosis. Electronically Signed   By: Iven Finn M.D.   On: 05/18/2021 18:37   CT Cervical Spine Wo Contrast  Result Date: 05/18/2021 CLINICAL DATA:  Loss of consciousness after fall. Status post fall. Nonlocalized acute abdominal pain. EXAM: CT HEAD WITHOUT CONTRAST CT CERVICAL SPINE WITHOUT CONTRAST CT CHEST, ABDOMEN AND PELVIS WITH CONTRAST TECHNIQUE: Contiguous axial images were obtained from the base of the skull through the vertex without intravenous contrast. Multidetector CT imaging of the cervical spine was performed without intravenous contrast. Multiplanar CT image reconstructions were also generated. Multidetector CT imaging of the chest, abdomen and pelvis was performed following the  standard protocol during bolus administration of intravenous contrast. CONTRAST:  138m OMNIPAQUE IOHEXOL 350 MG/ML SOLN COMPARISON:  None. FINDINGS: CT HEAD FINDINGS BRAIN: BRAIN Cerebral ventricle sizes are concordant with the degree of cerebral volume loss. Patchy and confluent areas of decreased attenuation are noted throughout the deep and periventricular white matter of the cerebral hemispheres bilaterally, compatible with chronic microvascular ischemic disease. No evidence of large-territorial acute infarction. No parenchymal hemorrhage. No mass lesion. No extra-axial collection. No mass effect or midline shift. No hydrocephalus. Basilar cisterns are patent. Vascular: No hyperdense vessel. Atherosclerotic calcifications are present within the cavernous internal carotid arteries. Skull: No acute fracture or focal lesion. Sinuses/Orbits:  Paranasal sinuses and mastoid air cells are clear. The orbits are unremarkable. Other: None. CT CERVICAL FINDINGS Alignment: Straightening of the normal cervical lordosis likely due to positioning and degenerative changes. Alignment is otherwise normal. Skull base and vertebrae: Moderate to severe multilevel degenerative changes of the spine. Associated multilevel severe osseous neural foraminal stenosis. No severe osseous central canal stenosis. No acute fracture. No aggressive appearing focal osseous lesion or focal pathologic process. Soft tissues and spinal canal: No prevertebral fluid or swelling. No visible canal hematoma. Upper chest: At least moderate volume left pleural effusion. No pneumothorax. Other: None. CHEST: Ports and Devices: None. Lungs/airways: Nonspecific thin walled cystic change of the right apex. Couple scattered pulmonary micronodules. Passive atelectasis of the left lower lobe. Passive atelectasis of the lingula. No focal consolidation. No pulmonary mass. No pulmonary contusion or laceration. No pneumatocele formation. The central airways are patent.  Pleura: Small to moderate volume left pleural effusion no right pleural effusion. No pneumothorax. No hemothorax. Lymph Nodes: No mediastinal, hilar, or axillary lymphadenopathy. Mediastinum: No pneumomediastinum. No aortic injury or mediastinal hematoma. The thoracic aorta is normal in caliber. Severe calcified and noncalcified atherosclerotic plaque of the thoracic aorta. At least three-vessel coronary calcifications. Mild aortic valve leaflet calcifications. The heart is normal in size. No significant pericardial effusion. The main pulmonary artery is normal in caliber. No pulmonary embolus. The esophagus is unremarkable. The thyroid is unremarkable. Chest Wall / Breasts: No chest wall mass. Musculoskeletal: No acute rib or sternal fracture. No spinal fracture. ABDOMEN / PELVIS: Liver: Not enlarged. Nodular hepatic contour. No focal lesion. No laceration or subcapsular hematoma. Biliary System: Layering hyperdensity within gallbladder lumen consistent with radio-opaque gallstones. No biliary ductal dilatation. Pancreas: Normal pancreatic contour. No main pancreatic duct dilatation. Spleen: Enlarged in caliber measuring up to at least 18 cm. No focal lesion. No laceration, subcapsular hematoma, or vascular injury. Adrenal Glands: No nodularity bilaterally. Kidneys: Bilateral kidneys enhance symmetrically. No hydronephrosis. No contusion, laceration, or subcapsular hematoma. Exophytic 1.7 cm fluid density lesion within the right kidney likely represents a simple renal cyst. Similar finding on the left measuring up to 1.8 cm. No injury to the vascular structures or collecting systems. No hydroureter. The urinary bladder is unremarkable. Bowel: No small or large bowel wall thickening or dilatation. The appendix is not definitely identified. Mesentery, Omentum, and Peritoneum: Small to moderate volume simple free fluid ascites. No pneumoperitoneum. No hemoperitoneum. No mesenteric hematoma identified. No organized  fluid collection. Pelvic Organs: Normal. Lymph Nodes: No abdominal, pelvic, inguinal lymphadenopathy. Vasculature: Recanalized paraumbilical vein. Upper abdominal venous collaterals. Small esophageal varices. The main portal, splenic, superior mesenteric veins are patent. No abdominal aorta or iliac aneurysm. No active contrast extravasation or pseudoaneurysm. Musculoskeletal: No significant soft tissue hematoma. No acute pelvic fracture. No spinal fracture. IMPRESSION: 1. No acute intracranial abnormality. 2. 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 3.  No acute traumatic injury to the chest, abdomen, or pelvis. 4. No acute fracture or traumatic malalignment of the thoracic or lumbar spine. Other imaging findings of potential clinical significance: 1. No pulmonary embolus. 2. Small to moderate volume left pleural effusion. 3. Cirrhosis with portal hypertension. No focal hepatic lesion on this single-phase portal venous study. Recommend nonemergent MRI liver protocol for further evaluation. When the patient is clinically stable and able to follow directions and hold their breath (preferably as an outpatient) further evaluation with dedicated abdominal  MRI should be considered. 4. Cholelithiasis no findings of acute cholecystitis. 5. Aortic Atherosclerosis (ICD10-I70.0). Including at least 3 vessel coronary artery calcifications as well as aortic valve leaflet calcifications. Correlate with aortic stenosis. Electronically Signed   By: Iven Finn M.D.   On: 05/18/2021 18:37   CT ABDOMEN PELVIS W CONTRAST  Result Date: 05/18/2021 CLINICAL DATA:  Loss of consciousness after fall. Status post fall. Nonlocalized acute abdominal pain. EXAM: CT HEAD WITHOUT CONTRAST CT CERVICAL SPINE WITHOUT CONTRAST CT CHEST, ABDOMEN AND PELVIS WITH CONTRAST TECHNIQUE: Contiguous axial images were  obtained from the base of the skull through the vertex without intravenous contrast. Multidetector CT imaging of the cervical spine was performed without intravenous contrast. Multiplanar CT image reconstructions were also generated. Multidetector CT imaging of the chest, abdomen and pelvis was performed following the standard protocol during bolus administration of intravenous contrast. CONTRAST:  140m OMNIPAQUE IOHEXOL 350 MG/ML SOLN COMPARISON:  None. FINDINGS: CT HEAD FINDINGS BRAIN: BRAIN Cerebral ventricle sizes are concordant with the degree of cerebral volume loss. Patchy and confluent areas of decreased attenuation are noted throughout the deep and periventricular white matter of the cerebral hemispheres bilaterally, compatible with chronic microvascular ischemic disease. No evidence of large-territorial acute infarction. No parenchymal hemorrhage. No mass lesion. No extra-axial collection. No mass effect or midline shift. No hydrocephalus. Basilar cisterns are patent. Vascular: No hyperdense vessel. Atherosclerotic calcifications are present within the cavernous internal carotid arteries. Skull: No acute fracture or focal lesion. Sinuses/Orbits: Paranasal sinuses and mastoid air cells are clear. The orbits are unremarkable. Other: None. CT CERVICAL FINDINGS Alignment: Straightening of the normal cervical lordosis likely due to positioning and degenerative changes. Alignment is otherwise normal. Skull base and vertebrae: Moderate to severe multilevel degenerative changes of the spine. Associated multilevel severe osseous neural foraminal stenosis. No severe osseous central canal stenosis. No acute fracture. No aggressive appearing focal osseous lesion or focal pathologic process. Soft tissues and spinal canal: No prevertebral fluid or swelling. No visible canal hematoma. Upper chest: At least moderate volume left pleural effusion. No pneumothorax. Other: None. CHEST: Ports and Devices: None. Lungs/airways:  Nonspecific thin walled cystic change of the right apex. Couple scattered pulmonary micronodules. Passive atelectasis of the left lower lobe. Passive atelectasis of the lingula. No focal consolidation. No pulmonary mass. No pulmonary contusion or laceration. No pneumatocele formation. The central airways are patent. Pleura: Small to moderate volume left pleural effusion no right pleural effusion. No pneumothorax. No hemothorax. Lymph Nodes: No mediastinal, hilar, or axillary lymphadenopathy. Mediastinum: No pneumomediastinum. No aortic injury or mediastinal hematoma. The thoracic aorta is normal in caliber. Severe calcified and noncalcified atherosclerotic plaque of the thoracic aorta. At least three-vessel coronary calcifications. Mild aortic valve leaflet calcifications. The heart is normal in size. No significant pericardial effusion. The main pulmonary artery is normal in caliber. No pulmonary embolus. The esophagus is unremarkable. The thyroid is unremarkable. Chest Wall / Breasts: No chest wall mass. Musculoskeletal: No acute rib or sternal fracture. No spinal fracture. ABDOMEN / PELVIS: Liver: Not enlarged. Nodular hepatic contour. No focal lesion. No laceration or subcapsular hematoma. Biliary System: Layering hyperdensity within gallbladder lumen consistent with radio-opaque gallstones. No biliary ductal dilatation. Pancreas: Normal pancreatic contour. No main pancreatic duct dilatation. Spleen: Enlarged in caliber measuring up to at least 18 cm. No focal lesion. No laceration, subcapsular hematoma, or vascular injury. Adrenal Glands: No nodularity bilaterally. Kidneys: Bilateral kidneys enhance symmetrically. No hydronephrosis. No contusion, laceration, or subcapsular hematoma. Exophytic 1.7 cm fluid density lesion  within the right kidney likely represents a simple renal cyst. Similar finding on the left measuring up to 1.8 cm. No injury to the vascular structures or collecting systems. No hydroureter. The  urinary bladder is unremarkable. Bowel: No small or large bowel wall thickening or dilatation. The appendix is not definitely identified. Mesentery, Omentum, and Peritoneum: Small to moderate volume simple free fluid ascites. No pneumoperitoneum. No hemoperitoneum. No mesenteric hematoma identified. No organized fluid collection. Pelvic Organs: Normal. Lymph Nodes: No abdominal, pelvic, inguinal lymphadenopathy. Vasculature: Recanalized paraumbilical vein. Upper abdominal venous collaterals. Small esophageal varices. The main portal, splenic, superior mesenteric veins are patent. No abdominal aorta or iliac aneurysm. No active contrast extravasation or pseudoaneurysm. Musculoskeletal: No significant soft tissue hematoma. No acute pelvic fracture. No spinal fracture. IMPRESSION: 1. No acute intracranial abnormality. 2. 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 3.  No acute traumatic injury to the chest, abdomen, or pelvis. 4. No acute fracture or traumatic malalignment of the thoracic or lumbar spine. Other imaging findings of potential clinical significance: 1. No pulmonary embolus. 2. Small to moderate volume left pleural effusion. 3. Cirrhosis with portal hypertension. No focal hepatic lesion on this single-phase portal venous study. Recommend nonemergent MRI liver protocol for further evaluation. When the patient is clinically stable and able to follow directions and hold their breath (preferably as an outpatient) further evaluation with dedicated abdominal MRI should be considered. 4. Cholelithiasis no findings of acute cholecystitis. 5. Aortic Atherosclerosis (ICD10-I70.0). Including at least 3 vessel coronary artery calcifications as well as aortic valve leaflet calcifications. Correlate with aortic stenosis. Electronically Signed   By: Iven Finn M.D.   On: 05/18/2021 18:37     ____________________________________________   PROCEDURES  Procedure(s) performed (including Critical Care):  .Critical Care  Date/Time: 05/18/2021 7:48 PM Performed by: Lucrezia Starch, MD Authorized by: Lucrezia Starch, MD   Critical care provider statement:    Critical care time (minutes):  45   Critical care was necessary to treat or prevent imminent or life-threatening deterioration of the following conditions:  Respiratory failure   Critical care was time spent personally by me on the following activities:  Discussions with consultants, evaluation of patient's response to treatment, examination of patient, ordering and performing treatments and interventions, ordering and review of laboratory studies, ordering and review of radiographic studies, pulse oximetry, re-evaluation of patient's condition, obtaining history from patient or surrogate and review of old charts   ____________________________________________   INITIAL IMPRESSION / [place] / ED COURSE      Patient presents with above-stated history exam after syncopal episode followed by some dizziness earlier today.  On arrival patient is hypotensive with a BP of 77/55 and tachycardic at 117 with otherwise stable vital signs on room air although his sat is borderline at 92%.  He has an abrasion over his forehead but no other obvious evidence of trauma on exam.  He has oriented and has a nonfocal neuro exam.  Differential with regard to his syncope episode and low blood pressure and tachycardia today and include septic shock, cardiogenic shock, hypovolemic shock likely from decreased p.o. intake he denies any recent GI losses with a lower suspicion for neurogenic shock.  Chest x-ray shows small left pleural effusion and atelectasis without rib fracture, pneumothorax, focal consolidation or other clear acute thoracic process. CT head shows no evidence of skull fracture, intracranial hemorrhage or  other acute  cranial process.  CT C-spine shows no acute C-spine fracture.  CT chest abdomen pelvis shows no evidence of PE, dissection, aneurysm but is remarkable for small to moderate left pleural effusion, cirrhosis with portal hypertension and cholelithiasis without evidence of cholecystitis.  No trauma to the T or L-spine.  No other acute abdominopelvic processes noted.  ECG and nonelevated troponin x2 are not suggestive of ACS or myocarditis.  BMP shows no significant electrolyte or metabolic derangements.  CBC shows no acute anemia or leukocytosis.  Patient is notably thrombocytopenic but this appears chronic.  [facility] function panel shows alk phos of 141 and a T bili of 3.5 with albumin of 3.5.  Magnesium 1.8.  Ammonia elevated at 97.  INR 1.7.  BNP slightly elevated at 318.  Lactic acid did uptrend from 2.6-2.8.  This was after patient received 1 L IV fluid.  Given the significant effusions on CT and ascites and I am concerned that additional IV fluid resuscitation will likely result in further respiratory failure is on my assessment his SPO2 was 88%.  This improved 95% on 2 L.  Patient initially stated he did not want any blood products after some discussion he changed his mind and was amenable to receiving some albumin.  We will give some albumin is quite low and suspect normal saline lactated Ringer's would likely further worsen his respiratory status.  He will be admitted to medicine service for further evaluation and management.       ____________________________________________   FINAL CLINICAL IMPRESSION(S) / ED DIAGNOSES  Final diagnoses:  Acute respiratory failure with hypoxia (HCC)  Syncope, unspecified syncope type  Lactic acid acidosis  Hepatic cirrhosis, unspecified hepatic cirrhosis type, unspecified whether ascites present (HCC)    Medications  albumin human 25 % solution 12.5 g (has no administration in time range)  LORazepam (ATIVAN) injection 0-4 mg (0 mg Intravenous Hold 05/18/21  1901)    Or  LORazepam (ATIVAN) tablet 0-4 mg ( Oral See Alternative 05/18/21 1901)  LORazepam (ATIVAN) injection 0-4 mg (has no administration in time range)    Or  LORazepam (ATIVAN) tablet 0-4 mg (has no administration in time range)  thiamine tablet 100 mg (100 mg Oral Given 05/18/21 1903)    Or  thiamine (B-1) injection 100 mg ( Intravenous See Alternative 05/18/21 1903)  lactated ringers bolus 1,000 mL (0 mLs Intravenous Stopped 05/18/21 1731)  iohexol (OMNIPAQUE) 350 MG/ML injection 100 mL (100 mLs Intravenous Contrast Given 05/18/21 1735)     ED Discharge Orders     None        Note:  This document was prepared using Dragon voice recognition software and may include unintentional dictation errors.    Lucrezia Starch, MD 05/18/21 [phone]

## 2021-05-18 NOTE — ED Notes (Signed)
Pt is agreeable to receiving albumin, which is a change in past preferences . Witnessed by myself and Dr Katrinka Blazing

## 2021-05-18 NOTE — H&P (Addendum)
Lawrence Osborn PJK:[id number] DOB: 28-Oct-1950 DOA: 05/18/2021     PCP: Danella Penton, MD   Outpatient Specialists: NONE    Patient arrived to ER on 05/18/21 at 1452 Referred by Attending Gilles Chiquito, MD   Patient coming from: home Lives  With family     Chief Complaint:   Chief Complaint  Patient presents with   Loss of Consciousness    HPI: Lawrence Osborn is a 70 y.o. male with medical history significant of Alcoholic cirrhosis of liver with ascites, Hepatic encephalopathy syndrome, hypothyrodism, bilateral PE, COPD, Dm2, A.fib, HLD, SLE,   peripheral neuropathy, CAD    Presented with   syncope lasting 30 seconds he was getting out of his car and fell out and hit concrete.  Hit the front of his head. Did not want to come with EMS initially.  He finally did agree to come because he was still feeling somewhat unsteady chronic diarrhea has been using lactulose Notes peripheral edema Now drinks 2 bears a day Denies any fevers or chills no cough nausea vomiting  reports rush for 1 week on his abdomen     Does not smoke Drinks 2 beers a day Reports his abdomen has gotten smoller His bp runs in low 100 and Hr in 120's  Patient reports although he takes regular lactulose his stools have been usually well formed for the past 3 days he been having very runny frequent diarrhea with makes him up from night with a foul smell.  Denies biotic use.  No change in lactulose dosing that he is aware of he actually stopped taking his lactulose today because of frequent bowel movement  Has  been vaccinated against COVID  and boosted   Initial COVID TEST  NEGATIVE   Lab Results  Component Value Date   SARSCOV2NAA NEGATIVE 05/18/2021   SARSCOV2NAA NEGATIVE 09/16/2020   SARSCOV2NAA NEGATIVE 07/30/2019     Regarding pertinent Chronic problems:       HTN on nadolol, aldactone     CAD  - On Aspirin  betablocker,      DM 2 -  Lab Results  Component Value Date   HGBA1C 9.1 (H)  07/30/2019   on insulin 50 bid   Hypothyroidism:  on synthroid    Morbid obesity-   BMI Readings from Last 1 Encounters:  05/18/21 41.66 kg/m       COPD - not  followed by pulmonology   not  on baseline oxygen         A. Fib -  - CHA2DS2 vas score  3    current  on anticoagulation with  Eliquis,           -  Rate control:  Currently controlled with Nadolol, dilatiazem           Hx of DVT/PE on - anticoagulation with  Eliquis,       Liver disease MELD-Na score: 20 at 05/18/2021  4:02 PM  On nadolol spironolactone and lasix     While in ER: Found to be hypoxic while on 2L 89-90% Pt agreeable to get albumin  CTA neg for PE Ammonia  Covid is negative trop neg x2 ED Triage Vitals  Enc Vitals Group     BP 05/18/21 1535 (!) 77/55     Pulse Rate 05/18/21 1535 (!) 117     Resp 05/18/21 1535 18     Temp 05/18/21 1535 (!) 97.5 F (36.4 C)     Temp Source  05/18/21 1535 Oral     SpO2 05/18/21 1535 92 %     Weight 05/18/21 1538 274 lb (124.3 kg)     Height 05/18/21 1538  (1.727 m)     Head Circumference --      Peak Flow --      Pain Score 05/18/21 1538 0     Pain Loc --      Pain Edu? --      Excl. in GC? --   TMAX(24)@     _________________________________________ Significant initial  Findings: Abnormal Labs Reviewed  BASIC METABOLIC PANEL - Abnormal; Notable for the following components:      Result Value   Sodium 133 (*)    Chloride 95 (*)    Glucose, Bld 116 (*)    Calcium 8.8 (*)    All other components within normal limits  CBC - Abnormal; Notable for the following components:   RBC 4.18 (*)    MCV 100.7 (*)    MCH 36.4 (*)    MCHC 36.1 (*)    Platelets 94 (*)    All other components within normal limits  HEPATIC FUNCTION PANEL - Abnormal; Notable for the following components:   Albumin 2.5 (*)    AST 50 (*)    Alkaline Phosphatase 141 (*)    Total Bilirubin 3.5 (*)    Bilirubin, Direct 1.1 (*)    Indirect Bilirubin 2.4 (*)    All other  components within normal limits  LACTIC ACID, PLASMA - Abnormal; Notable for the following components:   Lactic Acid, Venous 2.6 (*)    All other components within normal limits  LACTIC ACID, PLASMA - Abnormal; Notable for the following components:   Lactic Acid, Venous 2.8 (*)    All other components within normal limits  AMMONIA - Abnormal; Notable for the following components:   Ammonia 97 (*)    All other components within normal limits  PROTIME-INR - Abnormal; Notable for the following components:   Prothrombin Time 19.5 (*)    INR 1.7 (*)    All other components within normal limits  BRAIN NATRIURETIC PEPTIDE - Abnormal; Notable for the following components:   B Natriuretic Peptide 318.3 (*)    All other components within normal limits   ____________________________________________ Ordered CT HEAD   NON acute  CXR - Small left pleural effusion is noted with adjacent left basilar atelectasis  CTabd/pelvis -  Cirrhosis with portal hypertension  CTA chest -  Small to moderate volume left pleural effusion no PE,   no evidence of infiltrate   _________________________ Troponin 9 ECG: Ordered Personally reviewed by me showing: HR : 111 Rhythm: ectopic tachy RBBB    nonspecific changes,  QTC 620   The recent clinical data is shown below. Vitals:   05/18/21 1758 05/18/21 1830 05/18/21 1900 05/18/21 1900  BP: 102/85 103/84  103/84  Pulse: (!) 113 (!) 113 (!) 114 (!) 113  Resp: 18 (!) 40 (!) 25   Temp:      TempSrc:      SpO2: 92% 92% 95%   Weight:      Height:        WBC     Component Value Date/Time   WBC 5.5 05/18/2021 1602   LYMPHSABS 0.7 07/30/2019 1238   MONOABS 1.0 07/30/2019 1238   EOSABS 0.2 07/30/2019 1238   BASOSABS 0.1 07/30/2019 1238    Lactic Acid, Venous    Component Value Date/Time   LATICACIDVEN 2.8 (  HH) 05/18/2021 1759     Lactic Acid, Venous    Component Value Date/Time   LATICACIDVEN 2.8 (HH) 05/18/2021 1759      UA  no  evidence of UTI     Urine analysis:    Component Value Date/Time   COLORURINE YELLOW (A) 05/18/2021 2009   APPEARANCEUR CLEAR (A) 05/18/2021 2009   APPEARANCEUR Cloudy (A) 08/26/2020 1334   LABSPEC 1.038 (H) 05/18/2021 2009   PHURINE 7.0 05/18/2021 2009   GLUCOSEU NEGATIVE 05/18/2021 2009   HGBUR NEGATIVE 05/18/2021 2009   BILIRUBINUR NEGATIVE 05/18/2021 2009   BILIRUBINUR Negative 08/26/2020 1334   KETONESUR NEGATIVE 05/18/2021 2009   PROTEINUR NEGATIVE 05/18/2021 2009   NITRITE NEGATIVE 05/18/2021 2009   LEUKOCYTESUR NEGATIVE 05/18/2021 2009    Results for orders placed or performed during the hospital encounter of 05/18/21  Resp Panel by RT-PCR (Flu A&B, Covid) Nasopharyngeal Swab     Status: None   Collection Time: 05/18/21  4:09 PM   Specimen: Nasopharyngeal Swab; Nasopharyngeal(NP) swabs in vial transport medium  Result Value Ref Range Status   SARS Coronavirus 2 by RT PCR NEGATIVE NEGATIVE Final         Influenza A by PCR NEGATIVE NEGATIVE Final   Influenza B by PCR NEGATIVE NEGATIVE Final          _______________________________________________________   _______________________________________________ Hospitalist was called for admission for  acute encephalophathy  The following Work up has been ordered so far:  Orders Placed This Encounter  Procedures   Resp Panel by RT-PCR (Flu A&B, Covid) Nasopharyngeal Swab   Blood culture (routine single)   CT HEAD WO CONTRAST   CT Cervical Spine Wo Contrast   DG Chest 2 View   CT Angio Chest PE W and/or Wo Contrast   CT ABDOMEN PELVIS W CONTRAST   Basic metabolic panel   CBC   Urinalysis, Complete w Microscopic   Hepatic function panel   Lactic acid, plasma   Magnesium   Ammonia   Protime-INR   Ethanol   Brain natriuretic peptide   Document Height and Actual Weight   Cardiac monitoring   Assess and Document Glasgow Coma Scale   Document vital signs within 1-hour of fluid bolus completion.  Notify provider of  abnormal vital signs despite fluid resuscitation.   Refer to Sidebar Report: Sepsis Bundle ED/IP   Notify provider for difficulties obtaining IV access   Initiate Carrier Fluid Protocol   Notify physician (specify)   Pharmacy Tech to prioritize PTA Med Rec   Clinical institute withdrawal assessment every 6 hours X 48 hours, then every 12 hours x 48 hours   Notify MD if CIWA-AR is greater than 10 for 2 consecutive assessments.   May administer Ativan PO versus IV if patient is tolerating PO intake well   Vital signs every 6 hours X 48 hours, then per unit protocol   Full code   pharmacy consult   Consult to hospitalist   Airborne and Contact precautions   Pulse oximetry, continuous   CBG monitoring, ED   EKG 12-Lead   ED EKG   EKG 12-Lead   Insert peripheral IV X 1   Place in psych hold    Following Medications were ordered in ER: Medications  albumin human 25 % solution 12.5 g (has no administration in time range)  LORazepam (ATIVAN) injection 0-4 mg (0 mg Intravenous Hold 05/18/21 1901)    Or  LORazepam (ATIVAN) tablet 0-4 mg ( Oral See Alternative 05/18/21 1901)  LORazepam (ATIVAN) injection 0-4 mg (has no administration in time range)    Or  LORazepam (ATIVAN) tablet 0-4 mg (has no administration in time range)  thiamine tablet 100 mg (100 mg Oral Given 05/18/21 1903)    Or  thiamine (B-1) injection 100 mg ( Intravenous See Alternative 05/18/21 1903)  lactated ringers bolus 1,000 mL (0 mLs Intravenous Stopped 05/18/21 1731)  iohexol (OMNIPAQUE) 350 MG/ML injection 100 mL (100 mLs Intravenous Contrast Given 05/18/21 1735)        Consult Orders  (From admission, onward)           Start     Ordered   05/18/21 1844  Consult to hospitalist  Once       Provider:  (Not yet assigned)  Question Answer Comment  Place call to: [phone]   Reason for Consult Admit      05/18/21 1844            OTHER Significant initial  Findings:  labs showing:    Recent Labs  Lab  05/18/21 1602  NA 133*  K 4.4  CO2 31  GLUCOSE 116*  BUN 8  CREATININE 0.82  CALCIUM 8.8*  MG 1.8     Up from baseline see below Lab Results  Component Value Date   CREATININE 0.82 05/18/2021   CREATININE 0.56 (L) 04/11/2021   CREATININE 0.56 (L) 09/15/2020    Recent Labs  Lab 05/18/21 1602  AST 50*  ALT 20  ALKPHOS 141*  BILITOT 3.5*  PROT 6.8  ALBUMIN 2.5*   Lab Results  Component Value Date   CALCIUM 8.8 (L) 05/18/2021   PHOS 3.4 07/30/2019    Plt: Lab Results  Component Value Date   PLT 94 (L) 05/18/2021    COVID-19 Labs  No results for input(s): DDIMER, FERRITIN, LDH, CRP in the last 72 hours.  Lab Results  Component Value Date   SARSCOV2NAA NEGATIVE 05/18/2021   SARSCOV2NAA NEGATIVE 09/16/2020   SARSCOV2NAA NEGATIVE 07/30/2019      pH 7.38 pCO2 60  ABG    Component Value Date/Time   HCO3 23.2 08/29/2018 2212   ACIDBASEDEF 3.1 (H) 08/29/2018 2212   O2SAT 91.6 08/29/2018 2212       Recent Labs  Lab 05/18/21 1602  WBC 5.5  HGB 15.2  HCT 42.1  MCV 100.7*  PLT 94*    HG/HCT  stable,       Component Value Date/Time   HGB 15.2 05/18/2021 1602   HCT 42.1 05/18/2021 1602   MCV 100.7 (H) 05/18/2021 1602      No results for input(s): LIPASE, AMYLASE in the last 168 hours. Recent Labs  Lab 05/18/21 1602  AMMONIA 97*     Cardiac Panel (last 3 results) Recent Labs    05/18/21 1759  CKTOTAL 46*     BNP (last 3 results) Recent Labs    05/18/21 1602  BNP 318.3*      DM  labs:  HbA1C: No results for input(s): HGBA1C in the last 8760 hours.     CBG (last 3)  Recent Labs    05/18/21 2050 05/19/21 0007  GLUCAP 96 149*          Cultures:    Component Value Date/Time   SDES  07/31/2019 0809    URINE, CLEAN CATCH Performed at [facility], [address] [place] [place], [place] [address]    [facility]  07/31/2019 0809    Normal Performed at [facility], [address] [place], [place]  [address]    CULT 20,000 COLONIES/mL YEAST (A) 07/31/2019 0809   REPTSTATUS 08/02/2019 FINAL 07/31/2019 0809     Radiological Exams on Admission: DG Chest 2 View  Result Date: 05/18/2021 CLINICAL DATA:  Syncope. EXAM: CHEST - 2 VIEW COMPARISON:  April 11, 2021. FINDINGS: The heart size and mediastinal contours are within normal limits. No pneumothorax is noted. Right lung is clear. Small left pleural effusion is noted with adjacent left basilar atelectasis. The visualized skeletal structures are unremarkable. IMPRESSION: Small left pleural effusion is noted with adjacent left basilar atelectasis. Aortic Atherosclerosis (ICD10-I70.0). Electronically Signed   By: Lupita Raider M.D.   On: 05/18/2021 16:55   CT HEAD WO CONTRAST  Result Date: 05/18/2021 CLINICAL DATA:  Loss of consciousness after fall. Status post fall. Nonlocalized acute abdominal pain. EXAM: CT HEAD WITHOUT CONTRAST CT CERVICAL SPINE WITHOUT CONTRAST CT CHEST, ABDOMEN AND PELVIS WITH CONTRAST TECHNIQUE: Contiguous axial images were obtained from the base of the skull through the vertex without intravenous contrast. Multidetector CT imaging of the cervical spine was performed without intravenous contrast. Multiplanar CT image reconstructions were also generated. Multidetector CT imaging of the chest, abdomen and pelvis was performed following the standard protocol during bolus administration of intravenous contrast. CONTRAST:  OMNIPAQUE IOHEXOL 350 MG/ML SOLN COMPARISON:  None. FINDINGS: CT HEAD FINDINGS BRAIN: BRAIN Cerebral ventricle sizes are concordant with the degree of cerebral volume loss. Patchy and confluent areas of decreased attenuation are noted throughout the deep and periventricular white matter of the cerebral hemispheres bilaterally, compatible with chronic microvascular ischemic disease. No evidence of large-territorial acute infarction. No parenchymal hemorrhage. No mass lesion. No extra-axial collection. No mass effect  or midline shift. No hydrocephalus. Basilar cisterns are patent. Vascular: No hyperdense vessel. Atherosclerotic calcifications are present within the cavernous internal carotid arteries. Skull: No acute fracture or focal lesion. Sinuses/Orbits: Paranasal sinuses and mastoid air cells are clear. The orbits are unremarkable. Other: None. CT CERVICAL FINDINGS Alignment: Straightening of the normal cervical lordosis likely due to positioning and degenerative changes. Alignment is otherwise normal. Skull base and vertebrae: Moderate to severe multilevel degenerative changes of the spine. Associated multilevel severe osseous neural foraminal stenosis. No severe osseous central canal stenosis. No acute fracture. No aggressive appearing focal osseous lesion or focal pathologic process. Soft tissues and spinal canal: No prevertebral fluid or swelling. No visible canal hematoma. Upper chest: At least moderate volume left pleural effusion. No pneumothorax. Other: None. CHEST: Ports and Devices: None. Lungs/airways: Nonspecific thin walled cystic change of the right apex. Couple scattered pulmonary micronodules. Passive atelectasis of the left lower lobe. Passive atelectasis of the lingula. No focal consolidation. No pulmonary mass. No pulmonary contusion or laceration. No pneumatocele formation. The central airways are patent. Pleura: Small to moderate volume left pleural effusion no right pleural effusion. No pneumothorax. No hemothorax. Lymph Nodes: No mediastinal, hilar, or axillary lymphadenopathy. Mediastinum: No pneumomediastinum. No aortic injury or mediastinal hematoma. The thoracic aorta is normal in caliber. Severe calcified and noncalcified atherosclerotic plaque of the thoracic aorta. At least three-vessel coronary calcifications. Mild aortic valve leaflet calcifications. The heart is normal in size. No significant pericardial effusion. The main pulmonary artery is normal in caliber. No pulmonary embolus. The  esophagus is unremarkable. The thyroid is unremarkable. Chest Wall / Breasts: No chest wall mass. Musculoskeletal: No acute rib or sternal fracture. No spinal fracture. ABDOMEN / PELVIS: Liver: Not enlarged. Nodular hepatic contour. No focal lesion. No laceration or subcapsular hematoma.  Biliary System: Layering hyperdensity within gallbladder lumen consistent with radio-opaque gallstones. No biliary ductal dilatation. Pancreas: Normal pancreatic contour. No main pancreatic duct dilatation. Spleen: Enlarged in caliber measuring up to at least 18 cm. No focal lesion. No laceration, subcapsular hematoma, or vascular injury. Adrenal Glands: No nodularity bilaterally. Kidneys: Bilateral kidneys enhance symmetrically. No hydronephrosis. No contusion, laceration, or subcapsular hematoma. Exophytic 1.7 cm fluid density lesion within the right kidney likely represents a simple renal cyst. Similar finding on the left measuring up to 1.8 cm. No injury to the vascular structures or collecting systems. No hydroureter. The urinary bladder is unremarkable. Bowel: No small or large bowel wall thickening or dilatation. The appendix is not definitely identified. Mesentery, Omentum, and Peritoneum: Small to moderate volume simple free fluid ascites. No pneumoperitoneum. No hemoperitoneum. No mesenteric hematoma identified. No organized fluid collection. Pelvic Organs: Normal. Lymph Nodes: No abdominal, pelvic, inguinal lymphadenopathy. Vasculature: Recanalized paraumbilical vein. Upper abdominal venous collaterals. Small esophageal varices. The main portal, splenic, superior mesenteric veins are patent. No abdominal aorta or iliac aneurysm. No active contrast extravasation or pseudoaneurysm. Musculoskeletal: No significant soft tissue hematoma. No acute pelvic fracture. No spinal fracture. IMPRESSION: 1. No acute intracranial abnormality. 2. No acute displaced fracture or traumatic listhesis of the cervical spine in a patient with  multilevel severe osseous neural foraminal stenosis due to degenerative changes. 3.  No acute traumatic injury to the chest, abdomen, or pelvis. 4. No acute fracture or traumatic malalignment of the thoracic or lumbar spine. Other imaging findings of potential clinical significance: 1. No pulmonary embolus. 2. Small to moderate volume left pleural effusion. 3. Cirrhosis with portal hypertension. No focal hepatic lesion on this single-phase portal venous study. Recommend nonemergent MRI liver protocol for further evaluation. When the patient is clinically stable and able to follow directions and hold their breath (preferably as an outpatient) further evaluation with dedicated abdominal MRI should be considered. 4. Cholelithiasis no findings of acute cholecystitis. 5. Aortic Atherosclerosis (ICD10-I70.0). Including at least 3 vessel coronary artery calcifications as well as aortic valve leaflet calcifications. Correlate with aortic stenosis. Electronically Signed   By: Tish Frederickson M.D.   On: 05/18/2021 18:37   CT Angio Chest PE W and/or Wo Contrast  Result Date: 05/18/2021 CLINICAL DATA:  Loss of consciousness after fall. Status post fall. Nonlocalized acute abdominal pain. EXAM: CT HEAD WITHOUT CONTRAST CT CERVICAL SPINE WITHOUT CONTRAST CT CHEST, ABDOMEN AND PELVIS WITH CONTRAST TECHNIQUE: Contiguous axial images were obtained from the base of the skull through the vertex without intravenous contrast. Multidetector CT imaging of the cervical spine was performed without intravenous contrast. Multiplanar CT image reconstructions were also generated. Multidetector CT imaging of the chest, abdomen and pelvis was performed following the standard protocol during bolus administration of intravenous contrast. CONTRAST:  OMNIPAQUE IOHEXOL 350 MG/ML SOLN COMPARISON:  None. FINDINGS: CT HEAD FINDINGS BRAIN: BRAIN Cerebral ventricle sizes are concordant with the degree of cerebral volume loss. Patchy and confluent  areas of decreased attenuation are noted throughout the deep and periventricular white matter of the cerebral hemispheres bilaterally, compatible with chronic microvascular ischemic disease. No evidence of large-territorial acute infarction. No parenchymal hemorrhage. No mass lesion. No extra-axial collection. No mass effect or midline shift. No hydrocephalus. Basilar cisterns are patent. Vascular: No hyperdense vessel. Atherosclerotic calcifications are present within the cavernous internal carotid arteries. Skull: No acute fracture or focal lesion. Sinuses/Orbits: Paranasal sinuses and mastoid air cells are clear. The orbits are unremarkable. Other: None. CT CERVICAL FINDINGS Alignment:  Straightening of the normal cervical lordosis likely due to positioning and degenerative changes. Alignment is otherwise normal. Skull base and vertebrae: Moderate to severe multilevel degenerative changes of the spine. Associated multilevel severe osseous neural foraminal stenosis. No severe osseous central canal stenosis. No acute fracture. No aggressive appearing focal osseous lesion or focal pathologic process. Soft tissues and spinal canal: No prevertebral fluid or swelling. No visible canal hematoma. Upper chest: At least moderate volume left pleural effusion. No pneumothorax. Other: None. CHEST: Ports and Devices: None. Lungs/airways: Nonspecific thin walled cystic change of the right apex. Couple scattered pulmonary micronodules. Passive atelectasis of the left lower lobe. Passive atelectasis of the lingula. No focal consolidation. No pulmonary mass. No pulmonary contusion or laceration. No pneumatocele formation. The central airways are patent. Pleura: Small to moderate volume left pleural effusion no right pleural effusion. No pneumothorax. No hemothorax. Lymph Nodes: No mediastinal, hilar, or axillary lymphadenopathy. Mediastinum: No pneumomediastinum. No aortic injury or mediastinal hematoma. The thoracic aorta is  normal in caliber. Severe calcified and noncalcified atherosclerotic plaque of the thoracic aorta. At least three-vessel coronary calcifications. Mild aortic valve leaflet calcifications. The heart is normal in size. No significant pericardial effusion. The main pulmonary artery is normal in caliber. No pulmonary embolus. The esophagus is unremarkable. The thyroid is unremarkable. Chest Wall / Breasts: No chest wall mass. Musculoskeletal: No acute rib or sternal fracture. No spinal fracture. ABDOMEN / PELVIS: Liver: Not enlarged. Nodular hepatic contour. No focal lesion. No laceration or subcapsular hematoma. Biliary System: Layering hyperdensity within gallbladder lumen consistent with radio-opaque gallstones. No biliary ductal dilatation. Pancreas: Normal pancreatic contour. No main pancreatic duct dilatation. Spleen: Enlarged in caliber measuring up to at least 18 cm. No focal lesion. No laceration, subcapsular hematoma, or vascular injury. Adrenal Glands: No nodularity bilaterally. Kidneys: Bilateral kidneys enhance symmetrically. No hydronephrosis. No contusion, laceration, or subcapsular hematoma. Exophytic 1.7 cm fluid density lesion within the right kidney likely represents a simple renal cyst. Similar finding on the left measuring up to 1.8 cm. No injury to the vascular structures or collecting systems. No hydroureter. The urinary bladder is unremarkable. Bowel: No small or large bowel wall thickening or dilatation. The appendix is not definitely identified. Mesentery, Omentum, and Peritoneum: Small to moderate volume simple free fluid ascites. No pneumoperitoneum. No hemoperitoneum. No mesenteric hematoma identified. No organized fluid collection. Pelvic Organs: Normal. Lymph Nodes: No abdominal, pelvic, inguinal lymphadenopathy. Vasculature: Recanalized paraumbilical vein. Upper abdominal venous collaterals. Small esophageal varices. The main portal, splenic, superior mesenteric veins are patent. No  abdominal aorta or iliac aneurysm. No active contrast extravasation or pseudoaneurysm. Musculoskeletal: No significant soft tissue hematoma. No acute pelvic fracture. No spinal fracture. IMPRESSION: 1. No acute intracranial abnormality. 2. 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 3.  No acute traumatic injury to the chest, abdomen, or pelvis. 4. No acute fracture or traumatic malalignment of the thoracic or lumbar spine. Other imaging findings of potential clinical significance: 1. No pulmonary embolus. 2. Small to moderate volume left pleural effusion. 3. Cirrhosis with portal hypertension. No focal hepatic lesion on this single-phase portal venous study. Recommend nonemergent MRI liver protocol for further evaluation. When the patient is clinically stable and able to follow directions and hold their breath (preferably as an outpatient) further evaluation with dedicated abdominal MRI should be considered. 4. Cholelithiasis no findings of acute cholecystitis. 5. Aortic Atherosclerosis (ICD10-I70.0). Including at least  3 vessel coronary artery calcifications as well as aortic valve leaflet calcifications. Correlate with aortic stenosis. Electronically Signed   By: Tish Frederickson M.D.   On: 05/18/2021 18:37   CT Cervical Spine Wo Contrast  Result Date: 05/18/2021 CLINICAL DATA:  Loss of consciousness after fall. Status post fall. Nonlocalized acute abdominal pain. EXAM: CT HEAD WITHOUT CONTRAST CT CERVICAL SPINE WITHOUT CONTRAST CT CHEST, ABDOMEN AND PELVIS WITH CONTRAST TECHNIQUE: Contiguous axial images were obtained from the base of the skull through the vertex without intravenous contrast. Multidetector CT imaging of the cervical spine was performed without intravenous contrast. Multiplanar CT image reconstructions were also generated. Multidetector CT imaging of the chest, abdomen  and pelvis was performed following the standard protocol during bolus administration of intravenous contrast. CONTRAST:  OMNIPAQUE IOHEXOL 350 MG/ML SOLN COMPARISON:  None. FINDINGS: CT HEAD FINDINGS BRAIN: BRAIN Cerebral ventricle sizes are concordant with the degree of cerebral volume loss. Patchy and confluent areas of decreased attenuation are noted throughout the deep and periventricular white matter of the cerebral hemispheres bilaterally, compatible with chronic microvascular ischemic disease. No evidence of large-territorial acute infarction. No parenchymal hemorrhage. No mass lesion. No extra-axial collection. No mass effect or midline shift. No hydrocephalus. Basilar cisterns are patent. Vascular: No hyperdense vessel. Atherosclerotic calcifications are present within the cavernous internal carotid arteries. Skull: No acute fracture or focal lesion. Sinuses/Orbits: Paranasal sinuses and mastoid air cells are clear. The orbits are unremarkable. Other: None. CT CERVICAL FINDINGS Alignment: Straightening of the normal cervical lordosis likely due to positioning and degenerative changes. Alignment is otherwise normal. Skull base and vertebrae: Moderate to severe multilevel degenerative changes of the spine. Associated multilevel severe osseous neural foraminal stenosis. No severe osseous central canal stenosis. No acute fracture. No aggressive appearing focal osseous lesion or focal pathologic process. Soft tissues and spinal canal: No prevertebral fluid or swelling. No visible canal hematoma. Upper chest: At least moderate volume left pleural effusion. No pneumothorax. Other: None. CHEST: Ports and Devices: None. Lungs/airways: Nonspecific thin walled cystic change of the right apex. Couple scattered pulmonary micronodules. Passive atelectasis of the left lower lobe. Passive atelectasis of the lingula. No focal consolidation. No pulmonary mass. No pulmonary contusion or laceration. No pneumatocele  formation. The central airways are patent. Pleura: Small to moderate volume left pleural effusion no right pleural effusion. No pneumothorax. No hemothorax. Lymph Nodes: No mediastinal, hilar, or axillary lymphadenopathy. Mediastinum: No pneumomediastinum. No aortic injury or mediastinal hematoma. The thoracic aorta is normal in caliber. Severe calcified and noncalcified atherosclerotic plaque of the thoracic aorta. At least three-vessel coronary calcifications. Mild aortic valve leaflet calcifications. The heart is normal in size. No significant pericardial effusion. The main pulmonary artery is normal in caliber. No pulmonary embolus. The esophagus is unremarkable. The thyroid is unremarkable. Chest Wall / Breasts: No chest wall mass. Musculoskeletal: No acute rib or sternal fracture. No spinal fracture. ABDOMEN / PELVIS: Liver: Not enlarged. Nodular hepatic contour. No focal lesion. No laceration or subcapsular hematoma. Biliary System: Layering hyperdensity within gallbladder lumen consistent with radio-opaque gallstones. No biliary ductal dilatation. Pancreas: Normal pancreatic contour. No main pancreatic duct dilatation. Spleen: Enlarged in caliber measuring up to at least 18 cm. No focal lesion. No laceration, subcapsular hematoma, or vascular injury. Adrenal Glands: No nodularity bilaterally. Kidneys: Bilateral kidneys enhance symmetrically. No hydronephrosis. No contusion, laceration, or subcapsular hematoma. Exophytic 1.7 cm fluid density lesion within the right kidney likely represents a simple renal cyst. Similar finding on the left measuring up to  1.8 cm. No injury to the vascular structures or collecting systems. No hydroureter. The urinary bladder is unremarkable. Bowel: No small or large bowel wall thickening or dilatation. The appendix is not definitely identified. Mesentery, Omentum, and Peritoneum: Small to moderate volume simple free fluid ascites. No pneumoperitoneum. No hemoperitoneum. No  mesenteric hematoma identified. No organized fluid collection. Pelvic Organs: Normal. Lymph Nodes: No abdominal, pelvic, inguinal lymphadenopathy. Vasculature: Recanalized paraumbilical vein. Upper abdominal venous collaterals. Small esophageal varices. The main portal, splenic, superior mesenteric veins are patent. No abdominal aorta or iliac aneurysm. No active contrast extravasation or pseudoaneurysm. Musculoskeletal: No significant soft tissue hematoma. No acute pelvic fracture. No spinal fracture. IMPRESSION: 1. No acute intracranial abnormality. 2. 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 3.  No acute traumatic injury to the chest, abdomen, or pelvis. 4. No acute fracture or traumatic malalignment of the thoracic or lumbar spine. Other imaging findings of potential clinical significance: 1. No pulmonary embolus. 2. Small to moderate volume left pleural effusion. 3. Cirrhosis with portal hypertension. No focal hepatic lesion on this single-phase portal venous study. Recommend nonemergent MRI liver protocol for further evaluation. When the patient is clinically stable and able to follow directions and hold their breath (preferably as an outpatient) further evaluation with dedicated abdominal MRI should be considered. 4. Cholelithiasis no findings of acute cholecystitis. 5. Aortic Atherosclerosis (ICD10-I70.0). Including at least 3 vessel coronary artery calcifications as well as aortic valve leaflet calcifications. Correlate with aortic stenosis. Electronically Signed   By: Tish Frederickson M.D.   On: 05/18/2021 18:37   CT ABDOMEN PELVIS W CONTRAST  Result Date: 05/18/2021 CLINICAL DATA:  Loss of consciousness after fall. Status post fall. Nonlocalized acute abdominal pain. EXAM: CT HEAD WITHOUT CONTRAST CT CERVICAL SPINE WITHOUT CONTRAST CT CHEST, ABDOMEN AND PELVIS WITH CONTRAST  TECHNIQUE: Contiguous axial images were obtained from the base of the skull through the vertex without intravenous contrast. Multidetector CT imaging of the cervical spine was performed without intravenous contrast. Multiplanar CT image reconstructions were also generated. Multidetector CT imaging of the chest, abdomen and pelvis was performed following the standard protocol during bolus administration of intravenous contrast. CONTRAST:  OMNIPAQUE IOHEXOL 350 MG/ML SOLN COMPARISON:  None. FINDINGS: CT HEAD FINDINGS BRAIN: BRAIN Cerebral ventricle sizes are concordant with the degree of cerebral volume loss. Patchy and confluent areas of decreased attenuation are noted throughout the deep and periventricular white matter of the cerebral hemispheres bilaterally, compatible with chronic microvascular ischemic disease. No evidence of large-territorial acute infarction. No parenchymal hemorrhage. No mass lesion. No extra-axial collection. No mass effect or midline shift. No hydrocephalus. Basilar cisterns are patent. Vascular: No hyperdense vessel. Atherosclerotic calcifications are present within the cavernous internal carotid arteries. Skull: No acute fracture or focal lesion. Sinuses/Orbits: Paranasal sinuses and mastoid air cells are clear. The orbits are unremarkable. Other: None. CT CERVICAL FINDINGS Alignment: Straightening of the normal cervical lordosis likely due to positioning and degenerative changes. Alignment is otherwise normal. Skull base and vertebrae: Moderate to severe multilevel degenerative changes of the spine. Associated multilevel severe osseous neural foraminal stenosis. No severe osseous central canal stenosis. No acute fracture. No aggressive appearing focal osseous lesion or focal pathologic process. Soft tissues and spinal canal: No prevertebral fluid or swelling. No visible canal hematoma. Upper chest: At least moderate volume left pleural effusion. No pneumothorax. Other: None. CHEST:  Ports and Devices: None. Lungs/airways:  Nonspecific thin walled cystic change of the right apex. Couple scattered pulmonary micronodules. Passive atelectasis of the left lower lobe. Passive atelectasis of the lingula. No focal consolidation. No pulmonary mass. No pulmonary contusion or laceration. No pneumatocele formation. The central airways are patent. Pleura: Small to moderate volume left pleural effusion no right pleural effusion. No pneumothorax. No hemothorax. Lymph Nodes: No mediastinal, hilar, or axillary lymphadenopathy. Mediastinum: No pneumomediastinum. No aortic injury or mediastinal hematoma. The thoracic aorta is normal in caliber. Severe calcified and noncalcified atherosclerotic plaque of the thoracic aorta. At least three-vessel coronary calcifications. Mild aortic valve leaflet calcifications. The heart is normal in size. No significant pericardial effusion. The main pulmonary artery is normal in caliber. No pulmonary embolus. The esophagus is unremarkable. The thyroid is unremarkable. Chest Wall / Breasts: No chest wall mass. Musculoskeletal: No acute rib or sternal fracture. No spinal fracture. ABDOMEN / PELVIS: Liver: Not enlarged. Nodular hepatic contour. No focal lesion. No laceration or subcapsular hematoma. Biliary System: Layering hyperdensity within gallbladder lumen consistent with radio-opaque gallstones. No biliary ductal dilatation. Pancreas: Normal pancreatic contour. No main pancreatic duct dilatation. Spleen: Enlarged in caliber measuring up to at least 18 cm. No focal lesion. No laceration, subcapsular hematoma, or vascular injury. Adrenal Glands: No nodularity bilaterally. Kidneys: Bilateral kidneys enhance symmetrically. No hydronephrosis. No contusion, laceration, or subcapsular hematoma. Exophytic 1.7 cm fluid density lesion within the right kidney likely represents a simple renal cyst. Similar finding on the left measuring up to 1.8 cm. No injury to the vascular structures or  collecting systems. No hydroureter. The urinary bladder is unremarkable. Bowel: No small or large bowel wall thickening or dilatation. The appendix is not definitely identified. Mesentery, Omentum, and Peritoneum: Small to moderate volume simple free fluid ascites. No pneumoperitoneum. No hemoperitoneum. No mesenteric hematoma identified. No organized fluid collection. Pelvic Organs: Normal. Lymph Nodes: No abdominal, pelvic, inguinal lymphadenopathy. Vasculature: Recanalized paraumbilical vein. Upper abdominal venous collaterals. Small esophageal varices. The main portal, splenic, superior mesenteric veins are patent. No abdominal aorta or iliac aneurysm. No active contrast extravasation or pseudoaneurysm. Musculoskeletal: No significant soft tissue hematoma. No acute pelvic fracture. No spinal fracture. IMPRESSION: 1. No acute intracranial abnormality. 2. 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 3.  No acute traumatic injury to the chest, abdomen, or pelvis. 4. No acute fracture or traumatic malalignment of the thoracic or lumbar spine. Other imaging findings of potential clinical significance: 1. No pulmonary embolus. 2. Small to moderate volume left pleural effusion. 3. Cirrhosis with portal hypertension. No focal hepatic lesion on this single-phase portal venous study. Recommend nonemergent MRI liver protocol for further evaluation. When the patient is clinically stable and able to follow directions and hold their breath (preferably as an outpatient) further evaluation with dedicated abdominal MRI should be considered. 4. Cholelithiasis no findings of acute cholecystitis. 5. Aortic Atherosclerosis (ICD10-I70.0). Including at least 3 vessel coronary artery calcifications as well as aortic valve leaflet calcifications. Correlate with aortic stenosis. Electronically Signed   By:  Tish Frederickson M.D.   On: 05/18/2021 18:37   US Carotid Bilateral  Result Date: 05/18/2021 CLINICAL DATA:  Syncope and collapse. EXAM: BILATERAL CAROTID DUPLEX ULTRASOUND TECHNIQUE: Wallace Cullens scale imaging, color Doppler and duplex ultrasound were performed of bilateral carotid and vertebral arteries in the neck. COMPARISON:  None. FINDINGS: Criteria: Quantification of carotid stenosis is based on velocity parameters that correlate the residual internal  carotid diameter with NASCET-based stenosis levels, using the diameter of the distal internal carotid lumen as the denominator for stenosis measurement. The following velocity measurements were obtained: RIGHT ICA: 59 cm/sec CCA: 87 cm/sec SYSTOLIC ICA/CCA RATIO:  0.7 ECA: 67 cm/sec LEFT ICA: 76 cm/sec CCA: 69 cm/sec SYSTOLIC ICA/CCA RATIO:  1.1 ECA: 70 cm/sec RIGHT CAROTID ARTERY: Minimal atherosclerotic plaque in the right carotid bulb extending into the proximal ICA. There is atherosclerotic plaque in the external carotid artery. RIGHT VERTEBRAL ARTERY:  Patent with anteverted flow. LEFT CAROTID ARTERY: Mild plaque in the carotid bulb extending into the proximal ICA. LEFT VERTEBRAL ARTERY: There appears to be somewhat retrograde flow in the left vertebral artery. The left subclavian artery appears patent with normal direction. Focal stenosis at the origin of the left subclavian artery is not excluded. CT angiography may provide better evaluation. Upper extremity blood pressures: RIGHT: 110/71 LEFT: 105/78 IMPRESSION: 1. Less than 50% ICA stenosis bilaterally. 2. Retrograde appearance of flow in the left vertebral artery suspicious for subclavian steal syndrome. CT angiography is recommended to evaluate for possible proximal subclavian artery stenosis. Electronically Signed   By: Elgie Collard M.D.   On: 05/18/2021 22:26   _______________________________________________________________________________________________________ Latest  Blood pressure 103/84, pulse  (!) 113, temperature (!) 97.5 F (36.4 C), temperature source Oral, resp. rate (!) 25, height  (1.727 m), weight 124.3 kg, SpO2 95 %.   Review of Systems:    Pertinent positives include:  fatigue, Bilateral lower extremity swelling   anasarca, dizziness,  Constitutional:  No weight loss, night sweats, Fevers, chills,  weight loss  HEENT:  No headaches, Difficulty swallowing,Tooth/dental problems,Sore throat,  No sneezing, itching, ear ache, nasal congestion, post nasal drip,  Cardio-vascular:  No chest pain, Orthopnea, PND,palpitations.no  GI:  No heartburn, indigestion, abdominal pain, nausea, vomiting, diarrhea, change in bowel habits, loss of appetite, melena, blood in stool, hematemesis Resp:  no shortness of breath at rest. No dyspnea on exertion, No excess mucus, no productive cough, No non-productive cough, No coughing up of blood.No change in color of mucus.No wheezing. Skin:  no rash or lesions. No jaundice GU:  no dysuria, change in color of urine, no urgency or frequency. No straining to urinate.  No flank pain.  Musculoskeletal:  No joint pain or no joint swelling. No decreased range of motion. No back pain.  Psych:  No change in mood or affect. No depression or anxiety. No memory loss.  Neuro: no localizing neurological complaints, no tingling, no weakness, no double vision, no gait abnormality, no slurred speech, no confusion  All systems reviewed and apart from HOPI all are negative _______________________________________________________________________________________________ Past Medical History:   Past Medical History:  Diagnosis Date   Anemia    Cirrhosis of liver (HCC)    COPD (chronic obstructive pulmonary disease) (HCC)    Diabetes mellitus without complication (HCC)    DVT (deep venous thrombosis) (HCC)    mid calf to groin   GI bleed    Hypertension    Peritonitis (HCC)    Pneumothorax       Past Surgical History:  Procedure Laterality  Date   APPENDECTOMY     COLONOSCOPY     DORSAL SLIT N/A 09/20/2020   Procedure: DORSAL SLIT;  Surgeon: Riki Altes, MD;  Location: [facility] ORS;  Service: Urology;  Laterality: N/A;   HERNIA REPAIR      Social History:  Ambulatory   independently       reports that he has  quit smoking. He has never used smokeless tobacco. He reports current alcohol use of about 22.0 standard drinks per week. He reports that he does not currently use drugs.     Family History:   Family History  Problem Relation Age of Onset   Hypothyroidism Mother    CAD Father    ______________________________________________________________________________________________ Allergies: No Known Allergies   Prior to Admission medications   Medication Sig Start Date End Date Taking? Authorizing Provider  albuterol (PROVENTIL HFA;VENTOLIN HFA) 108 (90 Base) MCG/ACT inhaler Inhale 2 puffs into the lungs every 6 (six) hours as needed for wheezing or shortness of breath.   Yes [provider]  apixaban (ELIQUIS) 5 MG TABS tablet Take 1 tablet (5 mg total) by mouth 2 (two) times daily. 08/03/19  Yes Wieting, Richard, MD  Cyanocobalamin (B-12) 2500 MCG TABS Take 2,500 mcg by mouth daily.   Yes [provider]  ferrous gluconate (FERGON) 324 MG tablet Take 324 mg by mouth daily with breakfast.   Yes [provider]  furosemide (LASIX) 20 MG tablet Take 20 mg by mouth daily as needed for edema. 07/25/20  Yes [provider]  insulin glargine (LANTUS) 100 UNIT/ML injection Inject 35 Units into the skin 2 (two) times daily.   Yes [provider]  lactulose (CHRONULAC) 10 GM/15ML solution Take 20 g by mouth 2 (two) times daily. 04/18/21  Yes [provider]  levothyroxine (SYNTHROID, LEVOTHROID) 100 MCG tablet Take 100 mcg by mouth daily before breakfast.   Yes [provider]  nadolol (CORGARD) 40 MG tablet Take 2 tablets (80 mg total) by mouth daily. Patient taking  differently: Take 40 mg by mouth daily. 08/03/19  Yes Wieting, Richard, MD  omega-3 fish oil (MAXEPA) 1000 MG CAPS capsule Take 1,000 mg by mouth 2 (two) times daily.   Yes [provider]  omeprazole (PRILOSEC) 20 MG capsule Take 20 mg by mouth daily.   Yes [provider]  QUEtiapine (SEROQUEL) 100 MG tablet Take 100-200 mg by mouth See admin instructions. Take 100 mg in the morning and 200 mg at bedtime   Yes [provider]  spironolactone (ALDACTONE) 50 MG tablet Take 50 mg by mouth daily. 04/18/21  Yes [provider]  traMADol (ULTRAM) 50 MG tablet Take 50 mg by mouth 2 (two) times daily.   Yes [provider]  diltiazem (CARDIZEM CD) 180 MG 24 hr capsule Take 180 mg by mouth at bedtime. Patient not taking: Reported on 05/18/2021 07/06/20   [provider]  HYDROcodone-acetaminophen (NORCO/VICODIN) 5-325 MG tablet Take 1 tablet by mouth every 4 (four) hours as needed for moderate pain. Patient not taking: No sig reported 09/20/20   Riki AltesStoioff, Scott C, MD  Semaglutide, 1 MG/DOSE, (OZEMPIC, 1 MG/DOSE,) 2 MG/1.5ML SOPN Inject 1 mg into the skin every Sunday. Patient not taking: Reported on 05/18/2021    [provider]  ST JOHNS WORT PO Take 2 tablets by mouth 2 (two) times daily. Patient not taking: Reported on 05/18/2021    [provider]    ___________________________________________________________________________________________________ Physical Exam: Vitals with BMI 05/18/2021 05/18/2021 05/18/2021  Height - - -  Weight - - -  BMI - - -  Systolic 103 - 103  Diastolic 84 - 84  Pulse 113 114 113     1. General:  in No  Acute distress increased work of breathing     Chronically ill   -appearing 2. Psychological: Alert and  Oriented 3. Head/ENT:  Dry Mucous Membranes                          Head Non traumatic, neck supple                          Poor Dentition 4. SKIN: normal  Skin turgor,  Skin clean Dry and intact  pale pink papular rash on the upper abdomen 5. Heart: Regular rate and rhythm no  Murmur, no Rub or gallop 6. Lungs:   no wheezes or crackles   7. Abdomen: Soft,  non-tender,  distended   obese  bowel sounds present 8. Lower extremities: no clubbing, cyanosis, trace edema 9. Neurologically Grossly intact, moving all 4 extremities equally  10. MSK: Normal range of motion    Chart has been reviewed  ______________________________________________________________________________________________  Assessment/Plan 70 y.o. male with medical history significant of Alcoholic cirrhosis of liver with ascites, Hepatic encephalopathy syndrome, hypothyrodism, bilateral PE, COPD, Dm2, A.fib, HLD, SLE,   peripheral neuropathy, CAD    Admitted for syncope, hypotension  Present on Admission:  Acute respiratory failure (HCC) thought to be possibly secondary to fluid overload in the emergency department received albumin if blood pressure allows can try gentle diuresis   Type 2 diabetes mellitus with peripheral neuropathy (HCC) -  - Order Sensitive   SSI   - continue home insulin regimen but decrease  Lantus  30 units bid,  -  check TSH and HgA1C  - Hold by mouth medications    Thrombocytopenia (HCC) likely secondary to liver disease   Syncope -possibly secondary to transient hypotension.  Patient is third spacing, given a dose of albumin, cycle cardiac enzymes obtain echogram and carotid Dopplers CTA negative for PE   PAF (paroxysmal atrial fibrillation) (HCC) -continue Eliquis resume beta-blocker when able to tolerate   Hypothyroidism check TSH and continue Synthroid   Hypotension transient at baseline systolics in the 110 chronically tachycardic   Gastroesophageal reflux disease without esophagitis PPI   COPD (chronic obstructive pulmonary disease) (HCC) currently appears to be stable albuterol as needed   Chronic alcohol abuse -monitor for any sign withdrawal order CIWA :  Alcoholic cirrhosis  of liver without ascites (HCC) - Given hypotension for tonight hold spironolactone.  Attempt gentle diuresis given anasarca, resume beta-blocker when able to tolerate.  Spoke about importance of quit drinking will need follow-up with GI   Acute hepatic encephalopathy -continue with lactulose repeat ammonia level in the morning  Diarrhea -seems that loose stools much worse than his baseline.  Check gastric panel and C. difficile given foul-smelling diarrhea which is frequent This may be contributing to hypotension  Prolonged qt - - will monitor on tele avoid QT prolonging medications, rehydrate correct electrolytes  Other plan as per orders.  DVT prophylaxis:  eliquis    Code Status:    Code Status: Full Code FULL CODE    as per patient   I had personally discussed CODE STATUS with patient      Family Communication:   Family not at  Bedside    Disposition Plan:     To home once workup is complete and patient is stable   Following barriers for discharge:                            Electrolytes corrected  Will need to be able to tolerate PO                                                      Will need consultants to evaluate patient prior to discharge                      Would benefit from PT/OT eval prior to DC  Ordered                     Consults called: emailed GI Dr. Lars Pinks  Admission status:  ED Disposition     ED Disposition  Admit   Condition  --   Comment  Hospital Area: [facility] [[id number]]  Level of Care: Stepdown [14]  Covid Evaluation: Confirmed COVID Negative  Diagnosis: Acute respiratory failure (HCC) [518.81.ICD-9-CM]  Admitting Physician: Therisa Doyne [[id number]]  Attending Physician: Therisa Doyne [[id number]]  Estimated length of stay: past midnight tomorrow  Certification:: I certify this patient will need inpatient services for at least 2 midnights          inpatient      I Expect 2 midnight stay secondary to severity of patient's current illness need for inpatient interventions justified by the following:  hemodynamic instability despite optimal treatment (tachycardia  hypotension  )  Severe lab/radiological/exam abnormalities including:    Hypotension, cirrhois, bilateral plural effusions and extensive comorbidities including: substance abuse   Chronic pain  DM2     CAD  COPD/asthma Morbid Obesity      liver disease   Chronic anticoagulation  That are currently affecting medical management.   I expect  patient to be hospitalized for 2 midnights requiring inpatient medical care.  Patient is at high risk for adverse outcome (such as loss of life or disability) if not treated.  Indication for inpatient stay as follows:  Severe change from baseline regarding mental status Hemodynamic instability despite maximal medical therapy,    New or worsening hypoxia  Need for  iv albumin    Level of care          SDU tele indefinitely please discontinue once patient no longer qualifies COVID-19 Labs   Lab Results  Component Value Date   SARSCOV2NAA NEGATIVE 05/18/2021     Precautions: admitted as  Covid Negative    PPE: Used by the provider:   N95  eye Goggles,  Gloves    Yale Golla 05/19/2021, 12:50 AM    [facility]     after 2 AM please page floor coverage PA If 7AM-7PM, please contact the day team taking care of the patient using [email or web address]   Patient was evaluated in the context of the global COVID-19 pandemic, which necessitated consideration that the patient might be at risk for infection with the SARS-CoV-2 virus that causes COVID-19. Institutional protocols and algorithms that pertain to the evaluation of patients at risk for COVID-19 are in a state of rapid change based on information released by regulatory bodies including the CDC and federal and state organizations. These policies and algorithms were followed  during the patient's care.

## 2021-05-18 NOTE — ED Triage Notes (Signed)
Patient reports LOC today when getting out of his car and hitting concrete. Wife reports LOC lasted around 30 seconds. Patient refused transport with EMS. Reports he came to hospital because he was still unsteady once getting home and unable to ambulate. Patient seems to be poor historian on the exact story/situation.

## 2021-05-18 NOTE — ED Notes (Signed)
Patient provided nutrition and PO fluids upon request

## 2021-05-18 NOTE — ED Notes (Signed)
Pt placed on 2L [place] for sats 89-90% 

## 2021-05-19 ENCOUNTER — Inpatient Hospital Stay (HOSPITAL_COMMUNITY)
Admit: 2021-05-19 | Discharge: 2021-05-19 | Disposition: A | Discharge status: Home / Self Care | Payer: Medicare Other | Attending: Internal Medicine | Admitting: Internal Medicine

## 2021-05-19 ENCOUNTER — Inpatient Hospital Stay: Payer: Medicare Other

## 2021-05-19 DIAGNOSIS — I4891 Unspecified atrial fibrillation: Secondary | ICD-10-CM

## 2021-05-19 DIAGNOSIS — I4892 Unspecified atrial flutter: Secondary | ICD-10-CM | POA: Diagnosis not present

## 2021-05-19 DIAGNOSIS — R188 Other ascites: Secondary | ICD-10-CM | POA: Diagnosis not present

## 2021-05-19 DIAGNOSIS — R55 Syncope and collapse: Secondary | ICD-10-CM | POA: Diagnosis not present

## 2021-05-19 DIAGNOSIS — K7460 Unspecified cirrhosis of liver: Secondary | ICD-10-CM | POA: Diagnosis not present

## 2021-05-19 DIAGNOSIS — K7200 Acute and subacute hepatic failure without coma: Secondary | ICD-10-CM

## 2021-05-19 DIAGNOSIS — R9431 Abnormal electrocardiogram [ECG] [EKG]: Secondary | ICD-10-CM

## 2021-05-19 DIAGNOSIS — F1010 Alcohol abuse, uncomplicated: Secondary | ICD-10-CM | POA: Diagnosis not present

## 2021-05-19 LAB — CBC WITH DIFFERENTIAL/PLATELET
Abs Immature Granulocytes: 0.04 10*3/uL (ref 0.00–0.07)
Basophils Absolute: 0 10*3/uL (ref 0.0–0.1)
Basophils Relative: 1 %
Eosinophils Absolute: 0.3 10*3/uL (ref 0.0–0.5)
Eosinophils Relative: 6 %
HCT: 36.4 % — ABNORMAL LOW (ref 39.0–52.0)
Hemoglobin: 12.9 g/dL — ABNORMAL LOW (ref 13.0–17.0)
Immature Granulocytes: 1 %
Lymphocytes Relative: 14 %
Lymphs Abs: 0.6 10*3/uL — ABNORMAL LOW (ref 0.7–4.0)
MCH: 36.3 pg — ABNORMAL HIGH (ref 26.0–34.0)
MCHC: 35.4 g/dL (ref 30.0–36.0)
MCV: 102.5 fL — ABNORMAL HIGH (ref 80.0–100.0)
Monocytes Absolute: 0.7 10*3/uL (ref 0.1–1.0)
Monocytes Relative: 16 %
Neutro Abs: 2.6 10*3/uL (ref 1.7–7.7)
Neutrophils Relative %: 62 %
Platelets: 68 10*3/uL — ABNORMAL LOW (ref 150–400)
RBC: 3.55 MIL/uL — ABNORMAL LOW (ref 4.22–5.81)
RDW: 13.9 % (ref 11.5–15.5)
WBC: 4.2 10*3/uL (ref 4.0–10.5)
nRBC: 0 % (ref 0.0–0.2)

## 2021-05-19 LAB — CBG MONITORING, ED
Glucose-Capillary: 109 mg/dL — ABNORMAL HIGH (ref 70–99)
Glucose-Capillary: 131 mg/dL — ABNORMAL HIGH (ref 70–99)
Glucose-Capillary: 149 mg/dL — ABNORMAL HIGH (ref 70–99)
Glucose-Capillary: 86 mg/dL (ref 70–99)
Glucose-Capillary: 91 mg/dL (ref 70–99)

## 2021-05-19 LAB — ECHOCARDIOGRAM COMPLETE
AR max vel: 2.18 cm2
AV Area VTI: 2.46 cm2
AV Area mean vel: 2.13 cm2
AV Mean grad: 6 mmHg
AV Peak grad: 10.9 mmHg
Ao pk vel: 1.65 m/s
Area-P 1/2: 5.2 cm2
Calc EF: 50.6 %
Height: 68 in
MV VTI: 4.63 cm2
Single Plane A2C EF: 51.4 %
Single Plane A4C EF: 49.6 %
Weight: 4384 oz

## 2021-05-19 LAB — COMPREHENSIVE METABOLIC PANEL
ALT: 15 U/L (ref 0–44)
AST: 39 U/L (ref 15–41)
Albumin: 2.4 g/dL — ABNORMAL LOW (ref 3.5–5.0)
Alkaline Phosphatase: 106 U/L (ref 38–126)
Anion gap: 5 (ref 5–15)
BUN: 8 mg/dL (ref 8–23)
CO2: 30 mmol/L (ref 22–32)
Calcium: 8.1 mg/dL — ABNORMAL LOW (ref 8.9–10.3)
Chloride: 100 mmol/L (ref 98–111)
Creatinine, Ser: 0.71 mg/dL (ref 0.61–1.24)
GFR, Estimated: >60 mL/min (ref >60)
Glucose, Bld: 85 mg/dL (ref 70–99)
Potassium: 3.9 mmol/L (ref 3.5–5.1)
Sodium: 135 mmol/L (ref 135–145)
Total Bilirubin: 2.6 mg/dL — ABNORMAL HIGH (ref 0.3–1.2)
Total Protein: 5.8 g/dL — ABNORMAL LOW (ref 6.5–8.1)

## 2021-05-19 LAB — TSH: TSH: 4.994 u[IU]/mL — ABNORMAL HIGH (ref 0.350–4.500)

## 2021-05-19 LAB — MAGNESIUM: Magnesium: 1.5 mg/dL — ABNORMAL LOW (ref 1.7–2.4)

## 2021-05-19 LAB — HEMOGLOBIN A1C
Hgb A1c MFr Bld: 5.1 % (ref 4.8–5.6)
Mean Plasma Glucose: 99.67 mg/dL

## 2021-05-19 LAB — PHOSPHORUS: Phosphorus: 4.3 mg/dL (ref 2.5–4.6)

## 2021-05-19 LAB — AMMONIA: Ammonia: 77 umol/L — ABNORMAL HIGH (ref 9–35)

## 2021-05-19 LAB — GLUCOSE, CAPILLARY: Glucose-Capillary: 95 mg/dL (ref 70–99)

## 2021-05-19 LAB — HIV ANTIBODY (ROUTINE TESTING W REFLEX): HIV Screen 4th Generation wRfx: NONREACTIVE

## 2021-05-19 MED ORDER — METOPROLOL TARTRATE 25 MG PO TABS
12.5000 mg | ORAL_TABLET | Freq: Two times a day (BID) | ORAL | Status: DC
  Administered 2021-05-19 – 2021-05-20 (×2): 12.5 mg via ORAL
  Filled 2021-05-19 (×2): qty 1

## 2021-05-19 MED ORDER — FUROSEMIDE 10 MG/ML IJ SOLN: 40.0000 mg | Freq: Every day | INTRAMUSCULAR | Status: DC

## 2021-05-19 MED ORDER — ALBUTEROL SULFATE (2.5 MG/3ML) 0.083% IN NEBU: 3.0000 mL | INHALATION_SOLUTION | RESPIRATORY_TRACT | Status: DC | PRN

## 2021-05-19 MED ORDER — SODIUM CHLORIDE 0.9 % IV SOLN: 250.0000 mL | INTRAVENOUS | Status: DC | PRN

## 2021-05-19 MED ORDER — INSULIN GLARGINE-YFGN 100 UNIT/ML ~~LOC~~ SOLN
30.0000 [IU] | Freq: Two times a day (BID) | SUBCUTANEOUS | Status: DC
  Administered 2021-05-19 – 2021-05-23 (×6): 30 [IU] via SUBCUTANEOUS
  Filled 2021-05-19 (×11): qty 0.3

## 2021-05-19 MED ORDER — LEVOTHYROXINE SODIUM 100 MCG PO TABS
100.0000 ug | ORAL_TABLET | Freq: Every day | ORAL | Status: DC
  Administered 2021-05-19 – 2021-05-23 (×4): 100 ug via ORAL
  Filled 2021-05-19: qty 2
  Filled 2021-05-19 (×3): qty 1

## 2021-05-19 MED ORDER — SODIUM CHLORIDE 0.9% FLUSH
3.0000 mL | Freq: Two times a day (BID) | INTRAVENOUS | Status: DC
  Administered 2021-05-19 – 2021-05-22 (×7): 3 mL via INTRAVENOUS

## 2021-05-19 MED ORDER — ALBUTEROL SULFATE HFA 108 (90 BASE) MCG/ACT IN AERS: 2.0000 | INHALATION_SPRAY | Freq: Four times a day (QID) | RESPIRATORY_TRACT | Status: DC | PRN

## 2021-05-19 MED ORDER — FOLIC ACID 1 MG PO TABS
1.0000 mg | ORAL_TABLET | Freq: Every day | ORAL | Status: DC
  Administered 2021-05-19 – 2021-05-23 (×5): 1 mg via ORAL
  Filled 2021-05-19 (×5): qty 1

## 2021-05-19 MED ORDER — APIXABAN 5 MG PO TABS
5.0000 mg | ORAL_TABLET | Freq: Two times a day (BID) | ORAL | Status: DC
  Administered 2021-05-19 – 2021-05-23 (×9): 5 mg via ORAL
  Filled 2021-05-19 (×9): qty 1

## 2021-05-19 MED ORDER — MIDODRINE HCL 5 MG PO TABS
5.0000 mg | ORAL_TABLET | Freq: Three times a day (TID) | ORAL | Status: DC
  Administered 2021-05-19 – 2021-05-20 (×3): 5 mg via ORAL
  Filled 2021-05-19 (×3): qty 1

## 2021-05-19 MED ORDER — DIGOXIN 250 MCG PO TABS
0.2500 mg | ORAL_TABLET | ORAL | Status: AC
  Administered 2021-05-19 – 2021-05-20 (×2): 0.25 mg via ORAL
  Filled 2021-05-19 (×4): qty 1

## 2021-05-19 MED ORDER — SODIUM CHLORIDE 0.9% FLUSH
3.0000 mL | INTRAVENOUS | Status: DC | PRN
  Administered 2021-05-21: 3 mL via INTRAVENOUS

## 2021-05-19 MED ORDER — LACTULOSE 10 GM/15ML PO SOLN
30.0000 g | Freq: Three times a day (TID) | ORAL | Status: DC
  Administered 2021-05-19 – 2021-05-23 (×10): 30 g via ORAL
  Filled 2021-05-19 (×11): qty 60

## 2021-05-19 MED ORDER — DIGOXIN 250 MCG PO TABS
0.2500 mg | ORAL_TABLET | Freq: Every day | ORAL | Status: DC
  Administered 2021-05-20 – 2021-05-23 (×4): 0.25 mg via ORAL
  Filled 2021-05-19 (×4): qty 1

## 2021-05-19 MED ORDER — RIFAXIMIN 550 MG PO TABS
550.0000 mg | ORAL_TABLET | Freq: Two times a day (BID) | ORAL | Status: DC
  Administered 2021-05-19 – 2021-05-23 (×9): 550 mg via ORAL
  Filled 2021-05-19 (×11): qty 1

## 2021-05-19 NOTE — ED Notes (Signed)
Pt had voided in bed, unable to use urinal. Primofit and new linen/chux applied.

## 2021-05-19 NOTE — Progress Notes (Signed)
Patient arrived to room 2A53 from ED.  Assessment complete, VS obtained, and Admission database began.

## 2021-05-19 NOTE — ED Notes (Signed)
PT calls out to RN that patients Heart Rate elevated upon attempting to ambulate patient. Upon observation Patients heart rate was in the 200s. Patient does not appear to be in any distress. Admitting MD at bedside. EKG obtained.

## 2021-05-19 NOTE — Evaluation (Signed)
Physical Therapy Evaluation Patient Details Name: Lawrence Osborn MRN: [id number] DOB: 10-27-1950 Today's Date: 05/19/2021   History of Present Illness  Pt is a 70 y.o. male presenting to hospital 9/1 with loss of consciousness getting out of his car.  Pt reports 4-5 falls in last 2 days d/t syncope.  Pt admitted with acute respiratory failure, syncope, diarrhea, and acute hepatic encephalopathy.  PMH includes anemia, COPD, DM, htn, alcoholic cirrhosis, CAD, PAF on Eliquis, DVT, pneumothorax, PE, and peripheral neuropathy.  Clinical Impression  Prior to hospital admission, pt was ambulatory limited distances; lives with his wife in 1 level home with 2 STE B railings.  Currently pt is min assist with bed mobility and CGA to stand up to walker.  Orthostatics taken during session: supine BP 112/86 with HR 120 bpm; sitting BP 107/72 with HR 119 bpm; standing BP initially 92/53 with HR 120 bpm; and standing BP after 2 minutes 116/77 with HR 121 bpm.  Pt reports his HR is always elevated around 115-120 bpm.  After orthostatics taken pt sat down onto ED stretcher; about a minute after sitting down pt's HR noted to suddenly increase to >200 bpm (pt appearing asymptomatic).  Pt assisted back to bed and nurse notified immediately who came to check on pt and pt's HR then went back to around 120 bpm at rest (same HR as beginning of session at rest).  Nurse reported she notified ED MD regarding above HR concerns.  Pt would benefit from skilled PT to address noted impairments and functional limitations (see below for any additional details).  Upon hospital discharge, pt would benefit from continued therapy to improve overall strength and functional mobility.    Follow Up Recommendations SNF (pending progress)    Equipment Recommendations  Rolling walker with 5" wheels;3in1 (PT);Wheelchair (measurements PT);Wheelchair cushion (measurements PT)    Recommendations for Other Services OT consult     Precautions /  Restrictions Precautions Precautions: Fall Precaution Comments: monitor HR Restrictions Weight Bearing Restrictions: No      Mobility  Bed Mobility Overal bed mobility: Needs Assistance Bed Mobility: Supine to Sit;Sit to Supine     Supine to sit: Min assist;HOB elevated Sit to supine: Min assist;HOB elevated   General bed mobility comments: assist for trunk; vc's for technique    Transfers Overall transfer level: Needs assistance Equipment used: Rolling walker (2 wheeled) Transfers: Sit to/from Stand Sit to Stand: Min guard         General transfer comment: mild increased effort to stand from ED bed  Ambulation/Gait             General Gait Details: Deferred d/t pt suddenly with elevated HR  Stairs            Wheelchair Mobility    Modified Rankin (Stroke Patients Only)       Balance Overall balance assessment: Needs assistance Sitting-balance support: No upper extremity supported;Feet supported Sitting balance-Leahy Scale: Good Sitting balance - Comments: steady sitting reaching within BOS   Standing balance support: Single extremity supported Standing balance-Leahy Scale: Fair Standing balance comment: steady standing with at least single UE support                             Pertinent Vitals/Pain Pain Assessment: No/denies pain    Home Living Family/patient expects to be discharged to:: Private residence Living Arrangements: Spouse/significant other Available Help at Discharge: Family;Available 24 hours/day Type of Home: House Home Access:  Stairs to enter   [employer] of Steps: 2 steps to porch + threshold Home Layout: One level Home Equipment: Walker - 2 wheels;Cane - single point      Prior Function Level of Independence: Needs assistance   Gait / Transfers Assistance Needed: Assist to get out of bed.  Limited distance ambulation.  Easily gets SOB.  4-5 falls in past 2 days (no falls prior to  that).  ADL's / Homemaking Assistance Needed: Per OT eval "Pt able to complete LBD c use of reacher however spouse reports significantly increased time and spouse frequently assists."  Comments: (-) driving     Hand Dominance        Extremity/Trunk Assessment   Upper Extremity Assessment Upper Extremity Assessment: Overall WFL for tasks assessed    Lower Extremity Assessment Lower Extremity Assessment: Generalized weakness    Cervical / Trunk Assessment Cervical / Trunk Assessment: Normal  Communication   Communication: No difficulties  Cognition Arousal/Alertness: Awake/alert Behavior During Therapy: WFL for tasks assessed/performed Overall Cognitive Status: Within Functional Limits for tasks assessed                                        General Comments General comments (skin integrity, edema, etc.): desat to 81% on RA, resolved to 95% on 2L [place].  Nursing cleared pt for participation in physical therapy.  Pt agreeable to PT session.    Exercises    Assessment/Plan    PT Assessment Patient needs continued PT services  PT Problem List Decreased strength;Decreased activity tolerance;Decreased balance;Decreased mobility;Decreased knowledge of use of DME;Cardiopulmonary status limiting activity       PT Treatment Interventions DME instruction;Gait training;Stair training;Functional mobility training;Therapeutic activities;Therapeutic exercise;Balance training;Patient/family education    PT Goals (Current goals can be found in the Care Plan section)  Acute Rehab PT Goals Patient Stated Goal: to not fall PT Goal Formulation: With patient Time For Goal Achievement: 06/02/21 Potential to Achieve Goals: Fair    Frequency Min 2X/week   Barriers to discharge Decreased caregiver support      Co-evaluation               AM-PAC PT "6 Clicks" Mobility  Outcome Measure Help needed turning from your back to your side while in a flat bed without  using bedrails?: A Little Help needed moving from lying on your back to sitting on the side of a flat bed without using bedrails?: A Little Help needed moving to and from a bed to a chair (including a wheelchair)?: A Little Help needed standing up from a chair using your arms (e.g., wheelchair or bedside chair)?: A Little Help needed to walk in hospital room?: A Lot Help needed climbing 3-5 steps with a railing? : A Lot 6 Click Score: 16    End of Session Equipment Utilized During Treatment: Gait belt;Oxygen (2 L via nasal cannula) Activity Tolerance: Treatment limited secondary to medical complications (Comment) (sudden elevated HR) Patient left: in bed;with call bell/phone within reach;with family/visitor present Nurse Communication: Mobility status;Precautions;Other (comment) (pt's sudden elevated HR; also pt's orthostatic vitals) PT Visit Diagnosis: Other abnormalities of gait and mobility (R26.89);Muscle weakness (generalized) (M62.81);Difficulty in walking, not elsewhere classified (R26.2)    Time: 6712-4580 PT Time Calculation (min) (ACUTE ONLY): 34 min   Charges:   PT Evaluation $PT Eval Low Complexity: 1 Low PT Treatments $Therapeutic Activity: 23-37 mins  Hendricks Limes, PT 05/19/21, 5:48 PM

## 2021-05-19 NOTE — Progress Notes (Signed)
*  PRELIMINARY RESULTS* Echocardiogram 2D Echocardiogram has been performed.  Lawrence Osborn 05/19/2021, 11:10 AM

## 2021-05-19 NOTE — ED Notes (Signed)
Informed RN bed assigned 

## 2021-05-19 NOTE — ED Notes (Signed)
Ultrasound at bedside

## 2021-05-19 NOTE — ED Notes (Signed)
RN in to collect cbg, pt requesting to sit on side of bed to urinate at this time as well. Assistance provided as requested.

## 2021-05-19 NOTE — Evaluation (Signed)
Occupational Therapy Evaluation Patient Details Name: Lawrence Osborn MRN: [id number] DOB: [birth year] Today's Date: 05/19/2021    History of Present Illness Lawrence Osborn is a 70 y.o. male with medical history significant of Alcoholic cirrhosis of liver with ascites, Hepatic encephalopathy syndrome, hypothyrodism, bilateral PE, COPD, Dm2, A.fib, HLD, SLE,   peripheral neuropathy, CAD. Presented with   syncope lasting 30 seconds he was getting out of his car and fell out and hit concrete.   Clinical Impression   Lawrence Osborn was seen for OT evaluation this date. Prior to hospital admission, pt was Independent for mobility and requires assist for LBD, reports 4-5 falls in last 48 hours but denies falls prior. Pt lives c wife in house c 2 STE. Pt presents to acute OT demonstrating impaired ADL performance and functional mobility 2/2 decreased activity tolerance and functional balance deficits.   Pt currently requires MAX A don B socks seated EOB - pt reports near baseline. CGA for ADL t/f and ~10 ft mobility. SpO2 95% on 2L [place] at rest, desat low 80s ambulating on RA, resolved quickly on 2L [place]. Pt would benefit from skilled OT to address noted impairments and functional limitations (see below for any additional details) in order to maximize safety and independence while minimizing falls risk and caregiver burden. Upon hospital discharge, recommend HHOT to maximize pt safety and return to functional independence during meaningful occupations of daily life.     Follow Up Recommendations  Home health OT;Supervision - Intermittent    Equipment Recommendations  3 in 1 bedside commode    Recommendations for Other Services       Precautions / Restrictions Precautions Precautions: Fall Restrictions Weight Bearing Restrictions: No      Mobility Bed Mobility Overal bed mobility: Needs Assistance Bed Mobility: Supine to Sit;Sit to Supine     Supine to sit: Min assist Sit to supine: Min guard         Transfers Overall transfer level: Needs assistance Equipment used: None Transfers: Sit to/from Stand Sit to Stand: Min guard              Balance Overall balance assessment: Needs assistance Sitting-balance support: No upper extremity supported;Feet supported Sitting balance-Leahy Scale: Good     Standing balance support: No upper extremity supported;During functional activity Standing balance-Leahy Scale: Fair                             ADL either performed or assessed with clinical judgement   ADL Overall ADL's : Needs assistance/impaired                                       General ADL Comments: MAX A don B socks seated EOB - pt reports near baseline. CGA for ADL t/f and ~10 ft mobility. SpO2 95% on 2L [place] at rest and maintaned 90s on RA, desat low 80s ambulating on RA, resolved quickly on 2L [place].      Pertinent Vitals/Pain Pain Assessment: No/denies pain     Hand Dominance     Extremity/Trunk Assessment Upper Extremity Assessment Upper Extremity Assessment: Overall WFL for tasks assessed   Lower Extremity Assessment Lower Extremity Assessment: Generalized weakness       Communication Communication Communication: No difficulties   Cognition Arousal/Alertness: Awake/alert Behavior During Therapy: WFL for tasks assessed/performed Overall Cognitive Status: Within Functional Limits for tasks  assessed                                     General Comments  desat to 81% on RA, resolved to 95% on 2L [place]    Exercises Exercises: Other exercises Other Exercises Other Exercises: Pt and family educated re: OT role, DME recs, d/c recs, falls prevention, ECS Other Exercises: LBD, sup<>sit, sit<>stand, sitting/standing balance/tolerance, ~15 ft mobility   Shoulder Instructions      Home Living Family/patient expects to be discharged to:: Private residence Living Arrangements: Spouse/significant other Available Help at  Discharge: Family;Available 24 hours/day Type of Home: House Home Access: Stairs to enter [employer] of Steps: 2 steps to porch + threshold   Home Layout: One level               Home Equipment: Environmental consultant - 2 wheels;Cane - single point          Prior Functioning/Environment Level of Independence: Needs assistance  Gait / Transfers Assistance Needed: No physical assist for limited distance mobility (wife reports pt easily short of breath). Endorses 4-5 falls in last 48 hours, no falls prior ADL's / Homemaking Assistance Needed: Pt able to complete LBD c use of reacher however spouse reports significantly increased time and spouse frequently assists.            OT Problem List: Decreased activity tolerance;Impaired balance (sitting and/or standing);Cardiopulmonary status limiting activity      OT Treatment/Interventions: Therapeutic exercise;Self-care/ADL training;Energy conservation;DME and/or AE instruction;Therapeutic activities;Patient/family education;Balance training    OT Goals(Current goals can be found in the care plan section) Acute Rehab OT Goals Patient Stated Goal: to not fall OT Goal Formulation: With patient/family Time For Goal Achievement: 06/02/21 Potential to Achieve Goals: Good ADL Goals Pt Will Perform Grooming: with modified independence;standing (c LRAD PRN) Pt Will Perform Lower Body Dressing: with set-up;with adaptive equipment;sit to/from stand Pt Will Transfer to Toilet: Independently;ambulating;regular height toilet  OT Frequency: Min 1X/week    AM-PAC OT "6 Clicks" Daily Activity     Outcome Measure Help from another person eating meals?: None Help from another person taking care of personal grooming?: None Help from another person toileting, which includes using toliet, bedpan, or urinal?: A Little Help from another person bathing (including washing, rinsing, drying)?: A Little Help from another person to put on and taking off  regular upper body clothing?: None Help from another person to put on and taking off regular lower body clothing?: A Lot 6 Click Score: 20   End of Session Equipment Utilized During Treatment: Oxygen Nurse Communication: Mobility status  Activity Tolerance: Patient tolerated treatment well Patient left: in bed;with call bell/phone within reach;with family/visitor present  OT Visit Diagnosis: Other abnormalities of gait and mobility (R26.89)                Time: 9381-8299 OT Time Calculation (min): 25 min Charges:  OT General Charges $OT Visit: 1 Visit OT Evaluation $OT Eval Moderate Complexity: 1 Mod OT Treatments $Self Care/Home Management : 8-22 mins  Kathie Dike, M.S. OTR/L  05/19/21, 2:18 PM  ascom [phone]

## 2021-05-19 NOTE — ED Notes (Signed)
Unable to ambulate pt for pulse ox. Pt is extremely unsteady on feet.

## 2021-05-19 NOTE — Progress Notes (Signed)
PROGRESS NOTE    Lawrence Osborn  ZOX:[id number] DOB: 08/03/1951 DOA: 05/18/2021 PCP: Danella Penton, MD    Brief Narrative:  Lawrence Osborn is a 70 y.o. male with medical history significant of Alcoholic cirrhosis of liver with ascites, Hepatic encephalopathy syndrome, hypothyrodism, bilateral PE, COPD, Dm2, A.fib, HLD, SLE,   peripheral neuropathy, CAD     Presented with   syncope lasting 30 seconds he was getting out of his car and fell out and hit concrete.  Hit the front of his head. Did not want to come with EMS initially.  He finally did agree to come because he was still feeling somewhat unsteady chronic diarrhea has been using lactulose Notes peripheral edema Now drinks 2 bears a day Denies any fevers or chills no cough nausea vomiting  reports rush for 1 week on his abdomen      Does not smoke Drinks 2 beers a day Reports his abdomen has gotten smoller His bp runs in low 100 and Hr in 120's   Patient reports although he takes regular lactulose his stools have been usually well formed for the past 3 days he been having very runny frequent diarrhea with makes him up from night with a foul smell.  Denies biotic use.  No change in lactulose dosing that he is aware of he actually stopped taking his lactulose today because of frequent bowel movement  9/2- HR on monitor in 110's denies sob or cp.   Consultants:  GI  Procedures:   Antimicrobials:      Subjective: No dizziness, or sob. Reports last fall was 15 months ago  Objective: Vitals:   05/19/21 1000 05/19/21 1030 05/19/21 1100 05/19/21 1223  BP: 113/72 104/83 (!) 115/103 122/84  Pulse: (!) 101 (!) 118 (!) 118 (!) 118  Resp: (!) 21 20 (!) 21 18  Temp:      TempSrc:      SpO2: 91% 94% 91% 94%  Weight:      Height:       No intake or output data in the 24 hours ending 05/19/21 1536 Filed Weights   05/18/21 1538  Weight: 124.3 kg    Examination:  General exam: Appears calm and comfortable  Respiratory system:  Clear to auscultation. Respiratory effort normal. Cardiovascular system: S1 & S2 heard, RRR. No gallop Gastrointestinal system: Abdomen is distended, +bs NT. Central nervous system: Alert and oriented. Grossly intact Extremities: mild edema  Psychiatry: Mood & affect appropriate.     Data Reviewed: I have personally reviewed following labs and imaging studies  CBC: Recent Labs  Lab 05/18/21 1602 05/19/21 0410  WBC 5.5 4.2  NEUTROABS  --  2.6  HGB 15.2 12.9*  HCT 42.1 36.4*  MCV 100.7* 102.5*  PLT 94* 68*   Basic Metabolic Panel: Recent Labs  Lab 05/18/21 1602 05/18/21 1759 05/19/21 0410  NA 133*  --  135  K 4.4  --  3.9  CL 95*  --  100  CO2 31  --  30  GLUCOSE 116*  --  85  BUN 8  --  8  CREATININE 0.82  --  0.71  CALCIUM 8.8*  --  8.1*  MG 1.8  --  1.5*  PHOS  --  4.0 4.3   GFR: Estimated Creatinine Clearance: 113.5 mL/min (by C-G formula based on SCr of 0.71 mg/dL). Liver Function Tests: Recent Labs  Lab 05/18/21 1602 05/19/21 0410  AST 50* 39  ALT 20 15  ALKPHOS 141* 106  BILITOT 3.5* 2.6*  PROT 6.8 5.8*  ALBUMIN 2.5* 2.4*   No results for input(s): LIPASE, AMYLASE in the last 168 hours. Recent Labs  Lab 05/18/21 1602 05/19/21 0409  AMMONIA 97* 77*   Coagulation Profile: Recent Labs  Lab 05/18/21 1602  INR 1.7*   Cardiac Enzymes: Recent Labs  Lab 05/18/21 1759  CKTOTAL 46*   BNP (last 3 results) No results for input(s): PROBNP in the last 8760 hours. HbA1C: Recent Labs    05/18/21 2123  HGBA1C 5.1   CBG: Recent Labs  Lab 05/18/21 2050 05/19/21 0007 05/19/21 0408 05/19/21 0747 05/19/21 1143  GLUCAP 96 149* 91 86 131*   Lipid Profile: No results for input(s): CHOL, HDL, LDLCALC, TRIG, CHOLHDL, LDLDIRECT in the last 72 hours. Thyroid Function Tests: Recent Labs    05/19/21 0410  TSH 4.994*   Anemia Panel: No results for input(s): VITAMINB12, FOLATE, FERRITIN, TIBC, IRON, RETICCTPCT in the last 72 hours. Sepsis  Labs: Recent Labs  Lab 05/18/21 1602 05/18/21 1759 05/18/21 2123 05/19/21 0003  LATICACIDVEN 2.6* 2.8* 2.8* 2.1*    Recent Results (from the past 240 hour(s))  Blood culture (routine single)     Status: None (Preliminary result)   Collection Time: 05/18/21  4:02 PM   Specimen: BLOOD  Result Value Ref Range Status   Specimen Description BLOOD BLOOD RIGHT FOREARM  Final   Special Requests   Final    BOTTLES DRAWN AEROBIC AND ANAEROBIC Blood Culture adequate volume   Culture   Final    NO GROWTH < 24 HOURS Performed at [facility], [address] [place], [place] [address]    Report Status PENDING  Incomplete  Resp Panel by RT-PCR (Flu A&B, Covid) Nasopharyngeal Swab     Status: None   Collection Time: 05/18/21  4:09 PM   Specimen: Nasopharyngeal Swab; Nasopharyngeal(NP) swabs in vial transport medium  Result Value Ref Range Status   SARS Coronavirus 2 by RT PCR NEGATIVE NEGATIVE Final    Comment: (NOTE) SARS-CoV-2 target nucleic acids are NOT DETECTED.  The SARS-CoV-2 RNA is generally detectable in upper respiratory specimens during the acute phase of infection. The lowest concentration of SARS-CoV-2 viral copies this assay can detect is 138 copies/mL. A negative result does not preclude SARS-Cov-2 infection and should not be used as the sole basis for treatment or other patient management decisions. A negative result may occur with  improper specimen collection/handling, submission of specimen other than nasopharyngeal swab, presence of viral mutation(s) within the areas targeted by this assay, and inadequate number of viral copies(<138 copies/mL). A negative result must be combined with clinical observations, patient history, and epidemiological information. The expected result is Negative.  Fact Sheet for Patients:  BloggerCourse.com  Fact Sheet for Healthcare Providers:  SeriousBroker.it  This test is no  t yet approved or cleared by the [place] FDA and  has been authorized for detection and/or diagnosis of SARS-CoV-2 by FDA under an Emergency Use Authorization (EUA). This EUA will remain  in effect (meaning this test can be used) for the duration of the COVID-19 declaration under Section 564(b)(1) of the Act, 21 U.S.C.section 360bbb-3(b)(1), unless the authorization is terminated  or revoked sooner.       Influenza A by PCR NEGATIVE NEGATIVE Final   Influenza B by PCR NEGATIVE NEGATIVE Final    Comment: (NOTE) The Xpert Xpress SARS-CoV-2/FLU/RSV plus assay is intended as an aid in the diagnosis of influenza from Nasopharyngeal swab specimens and should not  be used as a sole basis for treatment. Nasal washings and aspirates are unacceptable for Xpert Xpress SARS-CoV-2/FLU/RSV testing.  Fact Sheet for Patients: BloggerCourse.com  Fact Sheet for Healthcare Providers: SeriousBroker.it  This test is not yet approved or cleared by the [place] FDA and has been authorized for detection and/or diagnosis of SARS-CoV-2 by FDA under an Emergency Use Authorization (EUA). This EUA will remain in effect (meaning this test can be used) for the duration of the COVID-19 declaration under Section 564(b)(1) of the Act, 21 U.S.C. section 360bbb-3(b)(1), unless the authorization is terminated or revoked.  Performed at [facility], [address], [place], [place] [address]          Radiology Studies: DG Chest 2 View  Result Date: 05/18/2021 CLINICAL DATA:  Syncope. EXAM: CHEST - 2 VIEW COMPARISON:  April 11, 2021. FINDINGS: The heart size and mediastinal contours are within normal limits. No pneumothorax is noted. Right lung is clear. Small left pleural effusion is noted with adjacent left basilar atelectasis. The visualized skeletal structures are unremarkable. IMPRESSION: Small left pleural effusion is noted with adjacent  left basilar atelectasis. Aortic Atherosclerosis (ICD10-I70.0). Electronically Signed   By: Lupita Raider M.D.   On: 05/18/2021 16:55   CT HEAD WO CONTRAST  Result Date: 05/18/2021 CLINICAL DATA:  Loss of consciousness after fall. Status post fall. Nonlocalized acute abdominal pain. EXAM: CT HEAD WITHOUT CONTRAST CT CERVICAL SPINE WITHOUT CONTRAST CT CHEST, ABDOMEN AND PELVIS WITH CONTRAST TECHNIQUE: Contiguous axial images were obtained from the base of the skull through the vertex without intravenous contrast. Multidetector CT imaging of the cervical spine was performed without intravenous contrast. Multiplanar CT image reconstructions were also generated. Multidetector CT imaging of the chest, abdomen and pelvis was performed following the standard protocol during bolus administration of intravenous contrast. CONTRAST:  OMNIPAQUE IOHEXOL 350 MG/ML SOLN COMPARISON:  None. FINDINGS: CT HEAD FINDINGS BRAIN: BRAIN Cerebral ventricle sizes are concordant with the degree of cerebral volume loss. Patchy and confluent areas of decreased attenuation are noted throughout the deep and periventricular white matter of the cerebral hemispheres bilaterally, compatible with chronic microvascular ischemic disease. No evidence of large-territorial acute infarction. No parenchymal hemorrhage. No mass lesion. No extra-axial collection. No mass effect or midline shift. No hydrocephalus. Basilar cisterns are patent. Vascular: No hyperdense vessel. Atherosclerotic calcifications are present within the cavernous internal carotid arteries. Skull: No acute fracture or focal lesion. Sinuses/Orbits: Paranasal sinuses and mastoid air cells are clear. The orbits are unremarkable. Other: None. CT CERVICAL FINDINGS Alignment: Straightening of the normal cervical lordosis likely due to positioning and degenerative changes. Alignment is otherwise normal. Skull base and vertebrae: Moderate to severe multilevel degenerative changes of  the spine. Associated multilevel severe osseous neural foraminal stenosis. No severe osseous central canal stenosis. No acute fracture. No aggressive appearing focal osseous lesion or focal pathologic process. Soft tissues and spinal canal: No prevertebral fluid or swelling. No visible canal hematoma. Upper chest: At least moderate volume left pleural effusion. No pneumothorax. Other: None. CHEST: Ports and Devices: None. Lungs/airways: Nonspecific thin walled cystic change of the right apex. Couple scattered pulmonary micronodules. Passive atelectasis of the left lower lobe. Passive atelectasis of the lingula. No focal consolidation. No pulmonary mass. No pulmonary contusion or laceration. No pneumatocele formation. The central airways are patent. Pleura: Small to moderate volume left pleural effusion no right pleural effusion. No pneumothorax. No hemothorax. Lymph Nodes: No mediastinal, hilar, or axillary lymphadenopathy. Mediastinum: No pneumomediastinum. No aortic injury  or mediastinal hematoma. The thoracic aorta is normal in caliber. Severe calcified and noncalcified atherosclerotic plaque of the thoracic aorta. At least three-vessel coronary calcifications. Mild aortic valve leaflet calcifications. The heart is normal in size. No significant pericardial effusion. The main pulmonary artery is normal in caliber. No pulmonary embolus. The esophagus is unremarkable. The thyroid is unremarkable. Chest Wall / Breasts: No chest wall mass. Musculoskeletal: No acute rib or sternal fracture. No spinal fracture. ABDOMEN / PELVIS: Liver: Not enlarged. Nodular hepatic contour. No focal lesion. No laceration or subcapsular hematoma. Biliary System: Layering hyperdensity within gallbladder lumen consistent with radio-opaque gallstones. No biliary ductal dilatation. Pancreas: Normal pancreatic contour. No main pancreatic duct dilatation. Spleen: Enlarged in caliber measuring up to at least 18 cm. No focal lesion. No  laceration, subcapsular hematoma, or vascular injury. Adrenal Glands: No nodularity bilaterally. Kidneys: Bilateral kidneys enhance symmetrically. No hydronephrosis. No contusion, laceration, or subcapsular hematoma. Exophytic 1.7 cm fluid density lesion within the right kidney likely represents a simple renal cyst. Similar finding on the left measuring up to 1.8 cm. No injury to the vascular structures or collecting systems. No hydroureter. The urinary bladder is unremarkable. Bowel: No small or large bowel wall thickening or dilatation. The appendix is not definitely identified. Mesentery, Omentum, and Peritoneum: Small to moderate volume simple free fluid ascites. No pneumoperitoneum. No hemoperitoneum. No mesenteric hematoma identified. No organized fluid collection. Pelvic Organs: Normal. Lymph Nodes: No abdominal, pelvic, inguinal lymphadenopathy. Vasculature: Recanalized paraumbilical vein. Upper abdominal venous collaterals. Small esophageal varices. The main portal, splenic, superior mesenteric veins are patent. No abdominal aorta or iliac aneurysm. No active contrast extravasation or pseudoaneurysm. Musculoskeletal: No significant soft tissue hematoma. No acute pelvic fracture. No spinal fracture. IMPRESSION: 1. No acute intracranial abnormality. 2. 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 3.  No acute traumatic injury to the chest, abdomen, or pelvis. 4. No acute fracture or traumatic malalignment of the thoracic or lumbar spine. Other imaging findings of potential clinical significance: 1. No pulmonary embolus. 2. Small to moderate volume left pleural effusion. 3. Cirrhosis with portal hypertension. No focal hepatic lesion on this single-phase portal venous study. Recommend nonemergent MRI liver protocol for further evaluation. When the patient is clinically stable and able  to follow directions and hold their breath (preferably as an outpatient) further evaluation with dedicated abdominal MRI should be considered. 4. Cholelithiasis no findings of acute cholecystitis. 5. Aortic Atherosclerosis (ICD10-I70.0). Including at least 3 vessel coronary artery calcifications as well as aortic valve leaflet calcifications. Correlate with aortic stenosis. Electronically Signed   By: Tish Frederickson M.D.   On: 05/18/2021 18:37   CT Angio Chest PE W and/or Wo Contrast  Result Date: 05/18/2021 CLINICAL DATA:  Loss of consciousness after fall. Status post fall. Nonlocalized acute abdominal pain. EXAM: CT HEAD WITHOUT CONTRAST CT CERVICAL SPINE WITHOUT CONTRAST CT CHEST, ABDOMEN AND PELVIS WITH CONTRAST TECHNIQUE: Contiguous axial images were obtained from the base of the skull through the vertex without intravenous contrast. Multidetector CT imaging of the cervical spine was performed without intravenous contrast. Multiplanar CT image reconstructions were also generated. Multidetector CT imaging of the chest, abdomen and pelvis was performed following the standard protocol during bolus administration of intravenous contrast. CONTRAST:  OMNIPAQUE IOHEXOL 350 MG/ML SOLN COMPARISON:  None. FINDINGS: CT HEAD FINDINGS BRAIN: BRAIN Cerebral ventricle sizes are concordant with the degree of cerebral volume loss.  Patchy and confluent areas of decreased attenuation are noted throughout the deep and periventricular white matter of the cerebral hemispheres bilaterally, compatible with chronic microvascular ischemic disease. No evidence of large-territorial acute infarction. No parenchymal hemorrhage. No mass lesion. No extra-axial collection. No mass effect or midline shift. No hydrocephalus. Basilar cisterns are patent. Vascular: No hyperdense vessel. Atherosclerotic calcifications are present within the cavernous internal carotid arteries. Skull: No acute fracture or focal lesion. Sinuses/Orbits:  Paranasal sinuses and mastoid air cells are clear. The orbits are unremarkable. Other: None. CT CERVICAL FINDINGS Alignment: Straightening of the normal cervical lordosis likely due to positioning and degenerative changes. Alignment is otherwise normal. Skull base and vertebrae: Moderate to severe multilevel degenerative changes of the spine. Associated multilevel severe osseous neural foraminal stenosis. No severe osseous central canal stenosis. No acute fracture. No aggressive appearing focal osseous lesion or focal pathologic process. Soft tissues and spinal canal: No prevertebral fluid or swelling. No visible canal hematoma. Upper chest: At least moderate volume left pleural effusion. No pneumothorax. Other: None. CHEST: Ports and Devices: None. Lungs/airways: Nonspecific thin walled cystic change of the right apex. Couple scattered pulmonary micronodules. Passive atelectasis of the left lower lobe. Passive atelectasis of the lingula. No focal consolidation. No pulmonary mass. No pulmonary contusion or laceration. No pneumatocele formation. The central airways are patent. Pleura: Small to moderate volume left pleural effusion no right pleural effusion. No pneumothorax. No hemothorax. Lymph Nodes: No mediastinal, hilar, or axillary lymphadenopathy. Mediastinum: No pneumomediastinum. No aortic injury or mediastinal hematoma. The thoracic aorta is normal in caliber. Severe calcified and noncalcified atherosclerotic plaque of the thoracic aorta. At least three-vessel coronary calcifications. Mild aortic valve leaflet calcifications. The heart is normal in size. No significant pericardial effusion. The main pulmonary artery is normal in caliber. No pulmonary embolus. The esophagus is unremarkable. The thyroid is unremarkable. Chest Wall / Breasts: No chest wall mass. Musculoskeletal: No acute rib or sternal fracture. No spinal fracture. ABDOMEN / PELVIS: Liver: Not enlarged. Nodular hepatic contour. No focal lesion.  No laceration or subcapsular hematoma. Biliary System: Layering hyperdensity within gallbladder lumen consistent with radio-opaque gallstones. No biliary ductal dilatation. Pancreas: Normal pancreatic contour. No main pancreatic duct dilatation. Spleen: Enlarged in caliber measuring up to at least 18 cm. No focal lesion. No laceration, subcapsular hematoma, or vascular injury. Adrenal Glands: No nodularity bilaterally. Kidneys: Bilateral kidneys enhance symmetrically. No hydronephrosis. No contusion, laceration, or subcapsular hematoma. Exophytic 1.7 cm fluid density lesion within the right kidney likely represents a simple renal cyst. Similar finding on the left measuring up to 1.8 cm. No injury to the vascular structures or collecting systems. No hydroureter. The urinary bladder is unremarkable. Bowel: No small or large bowel wall thickening or dilatation. The appendix is not definitely identified. Mesentery, Omentum, and Peritoneum: Small to moderate volume simple free fluid ascites. No pneumoperitoneum. No hemoperitoneum. No mesenteric hematoma identified. No organized fluid collection. Pelvic Organs: Normal. Lymph Nodes: No abdominal, pelvic, inguinal lymphadenopathy. Vasculature: Recanalized paraumbilical vein. Upper abdominal venous collaterals. Small esophageal varices. The main portal, splenic, superior mesenteric veins are patent. No abdominal aorta or iliac aneurysm. No active contrast extravasation or pseudoaneurysm. Musculoskeletal: No significant soft tissue hematoma. No acute pelvic fracture. No spinal fracture. IMPRESSION: 1. No acute intracranial abnormality. 2. 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 3.  No acute traumatic injury to the chest, abdomen, or pelvis. 4. No acute  fracture or traumatic malalignment of the thoracic or lumbar spine. Other imaging findings of  potential clinical significance: 1. No pulmonary embolus. 2. Small to moderate volume left pleural effusion. 3. Cirrhosis with portal hypertension. No focal hepatic lesion on this single-phase portal venous study. Recommend nonemergent MRI liver protocol for further evaluation. When the patient is clinically stable and able to follow directions and hold their breath (preferably as an outpatient) further evaluation with dedicated abdominal MRI should be considered. 4. Cholelithiasis no findings of acute cholecystitis. 5. Aortic Atherosclerosis (ICD10-I70.0). Including at least 3 vessel coronary artery calcifications as well as aortic valve leaflet calcifications. Correlate with aortic stenosis. Electronically Signed   By: Tish Frederickson M.D.   On: 05/18/2021 18:37   CT Cervical Spine Wo Contrast  Result Date: 05/18/2021 CLINICAL DATA:  Loss of consciousness after fall. Status post fall. Nonlocalized acute abdominal pain. EXAM: CT HEAD WITHOUT CONTRAST CT CERVICAL SPINE WITHOUT CONTRAST CT CHEST, ABDOMEN AND PELVIS WITH CONTRAST TECHNIQUE: Contiguous axial images were obtained from the base of the skull through the vertex without intravenous contrast. Multidetector CT imaging of the cervical spine was performed without intravenous contrast. Multiplanar CT image reconstructions were also generated. Multidetector CT imaging of the chest, abdomen and pelvis was performed following the standard protocol during bolus administration of intravenous contrast. CONTRAST:  OMNIPAQUE IOHEXOL 350 MG/ML SOLN COMPARISON:  None. FINDINGS: CT HEAD FINDINGS BRAIN: BRAIN Cerebral ventricle sizes are concordant with the degree of cerebral volume loss. Patchy and confluent areas of decreased attenuation are noted throughout the deep and periventricular white matter of the cerebral hemispheres bilaterally, compatible with chronic microvascular ischemic disease. No evidence of large-territorial acute infarction. No parenchymal  hemorrhage. No mass lesion. No extra-axial collection. No mass effect or midline shift. No hydrocephalus. Basilar cisterns are patent. Vascular: No hyperdense vessel. Atherosclerotic calcifications are present within the cavernous internal carotid arteries. Skull: No acute fracture or focal lesion. Sinuses/Orbits: Paranasal sinuses and mastoid air cells are clear. The orbits are unremarkable. Other: None. CT CERVICAL FINDINGS Alignment: Straightening of the normal cervical lordosis likely due to positioning and degenerative changes. Alignment is otherwise normal. Skull base and vertebrae: Moderate to severe multilevel degenerative changes of the spine. Associated multilevel severe osseous neural foraminal stenosis. No severe osseous central canal stenosis. No acute fracture. No aggressive appearing focal osseous lesion or focal pathologic process. Soft tissues and spinal canal: No prevertebral fluid or swelling. No visible canal hematoma. Upper chest: At least moderate volume left pleural effusion. No pneumothorax. Other: None. CHEST: Ports and Devices: None. Lungs/airways: Nonspecific thin walled cystic change of the right apex. Couple scattered pulmonary micronodules. Passive atelectasis of the left lower lobe. Passive atelectasis of the lingula. No focal consolidation. No pulmonary mass. No pulmonary contusion or laceration. No pneumatocele formation. The central airways are patent. Pleura: Small to moderate volume left pleural effusion no right pleural effusion. No pneumothorax. No hemothorax. Lymph Nodes: No mediastinal, hilar, or axillary lymphadenopathy. Mediastinum: No pneumomediastinum. No aortic injury or mediastinal hematoma. The thoracic aorta is normal in caliber. Severe calcified and noncalcified atherosclerotic plaque of the thoracic aorta. At least three-vessel coronary calcifications. Mild aortic valve leaflet calcifications. The heart is normal in size. No significant pericardial effusion. The main  pulmonary artery is normal in caliber. No pulmonary embolus. The esophagus is unremarkable. The thyroid is unremarkable. Chest Wall / Breasts: No chest wall mass. Musculoskeletal: No acute rib or sternal fracture. No spinal fracture. ABDOMEN / PELVIS: Liver: Not enlarged. Nodular hepatic contour.  No focal lesion. No laceration or subcapsular hematoma. Biliary System: Layering hyperdensity within gallbladder lumen consistent with radio-opaque gallstones. No biliary ductal dilatation. Pancreas: Normal pancreatic contour. No main pancreatic duct dilatation. Spleen: Enlarged in caliber measuring up to at least 18 cm. No focal lesion. No laceration, subcapsular hematoma, or vascular injury. Adrenal Glands: No nodularity bilaterally. Kidneys: Bilateral kidneys enhance symmetrically. No hydronephrosis. No contusion, laceration, or subcapsular hematoma. Exophytic 1.7 cm fluid density lesion within the right kidney likely represents a simple renal cyst. Similar finding on the left measuring up to 1.8 cm. No injury to the vascular structures or collecting systems. No hydroureter. The urinary bladder is unremarkable. Bowel: No small or large bowel wall thickening or dilatation. The appendix is not definitely identified. Mesentery, Omentum, and Peritoneum: Small to moderate volume simple free fluid ascites. No pneumoperitoneum. No hemoperitoneum. No mesenteric hematoma identified. No organized fluid collection. Pelvic Organs: Normal. Lymph Nodes: No abdominal, pelvic, inguinal lymphadenopathy. Vasculature: Recanalized paraumbilical vein. Upper abdominal venous collaterals. Small esophageal varices. The main portal, splenic, superior mesenteric veins are patent. No abdominal aorta or iliac aneurysm. No active contrast extravasation or pseudoaneurysm. Musculoskeletal: No significant soft tissue hematoma. No acute pelvic fracture. No spinal fracture. IMPRESSION: 1. No acute intracranial abnormality. 2. No acute displaced fracture  or traumatic listhesis of the cervical spine in a patient with multilevel severe osseous neural foraminal stenosis due to degenerative changes. 3.  No acute traumatic injury to the chest, abdomen, or pelvis. 4. No acute fracture or traumatic malalignment of the thoracic or lumbar spine. Other imaging findings of potential clinical significance: 1. No pulmonary embolus. 2. Small to moderate volume left pleural effusion. 3. Cirrhosis with portal hypertension. No focal hepatic lesion on this single-phase portal venous study. Recommend nonemergent MRI liver protocol for further evaluation. When the patient is clinically stable and able to follow directions and hold their breath (preferably as an outpatient) further evaluation with dedicated abdominal MRI should be considered. 4. Cholelithiasis no findings of acute cholecystitis. 5. Aortic Atherosclerosis (ICD10-I70.0). Including at least 3 vessel coronary artery calcifications as well as aortic valve leaflet calcifications. Correlate with aortic stenosis. Electronically Signed   By: Tish Frederickson M.D.   On: 05/18/2021 18:37   CT ABDOMEN PELVIS W CONTRAST  Result Date: 05/18/2021 CLINICAL DATA:  Loss of consciousness after fall. Status post fall. Nonlocalized acute abdominal pain. EXAM: CT HEAD WITHOUT CONTRAST CT CERVICAL SPINE WITHOUT CONTRAST CT CHEST, ABDOMEN AND PELVIS WITH CONTRAST TECHNIQUE: Contiguous axial images were obtained from the base of the skull through the vertex without intravenous contrast. Multidetector CT imaging of the cervical spine was performed without intravenous contrast. Multiplanar CT image reconstructions were also generated. Multidetector CT imaging of the chest, abdomen and pelvis was performed following the standard protocol during bolus administration of intravenous contrast. CONTRAST:  OMNIPAQUE IOHEXOL 350 MG/ML SOLN COMPARISON:  None. FINDINGS: CT HEAD FINDINGS BRAIN: BRAIN Cerebral ventricle sizes are concordant with the  degree of cerebral volume loss. Patchy and confluent areas of decreased attenuation are noted throughout the deep and periventricular white matter of the cerebral hemispheres bilaterally, compatible with chronic microvascular ischemic disease. No evidence of large-territorial acute infarction. No parenchymal hemorrhage. No mass lesion. No extra-axial collection. No mass effect or midline shift. No hydrocephalus. Basilar cisterns are patent. Vascular: No hyperdense vessel. Atherosclerotic calcifications are present within the cavernous internal carotid arteries. Skull: No acute fracture or focal lesion. Sinuses/Orbits: Paranasal sinuses and mastoid air cells are clear. The orbits are unremarkable. Other:  None. CT CERVICAL FINDINGS Alignment: Straightening of the normal cervical lordosis likely due to positioning and degenerative changes. Alignment is otherwise normal. Skull base and vertebrae: Moderate to severe multilevel degenerative changes of the spine. Associated multilevel severe osseous neural foraminal stenosis. No severe osseous central canal stenosis. No acute fracture. No aggressive appearing focal osseous lesion or focal pathologic process. Soft tissues and spinal canal: No prevertebral fluid or swelling. No visible canal hematoma. Upper chest: At least moderate volume left pleural effusion. No pneumothorax. Other: None. CHEST: Ports and Devices: None. Lungs/airways: Nonspecific thin walled cystic change of the right apex. Couple scattered pulmonary micronodules. Passive atelectasis of the left lower lobe. Passive atelectasis of the lingula. No focal consolidation. No pulmonary mass. No pulmonary contusion or laceration. No pneumatocele formation. The central airways are patent. Pleura: Small to moderate volume left pleural effusion no right pleural effusion. No pneumothorax. No hemothorax. Lymph Nodes: No mediastinal, hilar, or axillary lymphadenopathy. Mediastinum: No pneumomediastinum. No aortic injury  or mediastinal hematoma. The thoracic aorta is normal in caliber. Severe calcified and noncalcified atherosclerotic plaque of the thoracic aorta. At least three-vessel coronary calcifications. Mild aortic valve leaflet calcifications. The heart is normal in size. No significant pericardial effusion. The main pulmonary artery is normal in caliber. No pulmonary embolus. The esophagus is unremarkable. The thyroid is unremarkable. Chest Wall / Breasts: No chest wall mass. Musculoskeletal: No acute rib or sternal fracture. No spinal fracture. ABDOMEN / PELVIS: Liver: Not enlarged. Nodular hepatic contour. No focal lesion. No laceration or subcapsular hematoma. Biliary System: Layering hyperdensity within gallbladder lumen consistent with radio-opaque gallstones. No biliary ductal dilatation. Pancreas: Normal pancreatic contour. No main pancreatic duct dilatation. Spleen: Enlarged in caliber measuring up to at least 18 cm. No focal lesion. No laceration, subcapsular hematoma, or vascular injury. Adrenal Glands: No nodularity bilaterally. Kidneys: Bilateral kidneys enhance symmetrically. No hydronephrosis. No contusion, laceration, or subcapsular hematoma. Exophytic 1.7 cm fluid density lesion within the right kidney likely represents a simple renal cyst. Similar finding on the left measuring up to 1.8 cm. No injury to the vascular structures or collecting systems. No hydroureter. The urinary bladder is unremarkable. Bowel: No small or large bowel wall thickening or dilatation. The appendix is not definitely identified. Mesentery, Omentum, and Peritoneum: Small to moderate volume simple free fluid ascites. No pneumoperitoneum. No hemoperitoneum. No mesenteric hematoma identified. No organized fluid collection. Pelvic Organs: Normal. Lymph Nodes: No abdominal, pelvic, inguinal lymphadenopathy. Vasculature: Recanalized paraumbilical vein. Upper abdominal venous collaterals. Small esophageal varices. The main portal, splenic,  superior mesenteric veins are patent. No abdominal aorta or iliac aneurysm. No active contrast extravasation or pseudoaneurysm. Musculoskeletal: No significant soft tissue hematoma. No acute pelvic fracture. No spinal fracture. IMPRESSION: 1. No acute intracranial abnormality. 2. 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 3.  No acute traumatic injury to the chest, abdomen, or pelvis. 4. No acute fracture or traumatic malalignment of the thoracic or lumbar spine. Other imaging findings of potential clinical significance: 1. No pulmonary embolus. 2. Small to moderate volume left pleural effusion. 3. Cirrhosis with portal hypertension. No focal hepatic lesion on this single-phase portal venous study. Recommend nonemergent MRI liver protocol for further evaluation. When the patient is clinically stable and able to follow directions and hold their breath (preferably as an outpatient) further evaluation with dedicated abdominal MRI should be considered. 4. Cholelithiasis no findings of acute cholecystitis. 5. Aortic  Atherosclerosis (ICD10-I70.0). Including at least 3 vessel coronary artery calcifications as well as aortic valve leaflet calcifications. Correlate with aortic stenosis. Electronically Signed   By: Tish Frederickson M.D.   On: 05/18/2021 18:37   US Carotid Bilateral  Result Date: 05/18/2021 CLINICAL DATA:  Syncope and collapse. EXAM: BILATERAL CAROTID DUPLEX ULTRASOUND TECHNIQUE: Wallace Cullens scale imaging, color Doppler and duplex ultrasound were performed of bilateral carotid and vertebral arteries in the neck. COMPARISON:  None. FINDINGS: Criteria: Quantification of carotid stenosis is based on velocity parameters that correlate the residual internal carotid diameter with NASCET-based stenosis levels, using the diameter of the distal internal carotid lumen as the denominator for  stenosis measurement. The following velocity measurements were obtained: RIGHT ICA: 59 cm/sec CCA: 87 cm/sec SYSTOLIC ICA/CCA RATIO:  0.7 ECA: 67 cm/sec LEFT ICA: 76 cm/sec CCA: 69 cm/sec SYSTOLIC ICA/CCA RATIO:  1.1 ECA: 70 cm/sec RIGHT CAROTID ARTERY: Minimal atherosclerotic plaque in the right carotid bulb extending into the proximal ICA. There is atherosclerotic plaque in the external carotid artery. RIGHT VERTEBRAL ARTERY:  Patent with anteverted flow. LEFT CAROTID ARTERY: Mild plaque in the carotid bulb extending into the proximal ICA. LEFT VERTEBRAL ARTERY: There appears to be somewhat retrograde flow in the left vertebral artery. The left subclavian artery appears patent with normal direction. Focal stenosis at the origin of the left subclavian artery is not excluded. CT angiography may provide better evaluation. Upper extremity blood pressures: RIGHT: 110/71 LEFT: 105/78 IMPRESSION: 1. Less than 50% ICA stenosis bilaterally. 2. Retrograde appearance of flow in the left vertebral artery suspicious for subclavian steal syndrome. CT angiography is recommended to evaluate for possible proximal subclavian artery stenosis. Electronically Signed   By: Elgie Collard M.D.   On: 05/18/2021 22:26   [place] ASCITES (ABDOMEN LIMITED)  Result Date: 05/19/2021 CLINICAL DATA:  Ascites EXAM: LIMITED ABDOMEN ULTRASOUND FOR ASCITES TECHNIQUE: Limited ultrasound survey for ascites was performed in all four abdominal quadrants. COMPARISON:  CT 05/18/2021 FINDINGS: There is moderate volume abdominal ascites seen in the left upper and lower quadrants. No ascites seen in the right upper or right lower quadrants. IMPRESSION: Moderate volume ascites seen in the left upper and lower quadrants. Electronically Signed   By: Caprice Renshaw M.D.   On: 05/19/2021 14:37        Scheduled Meds:  apixaban  5 mg Oral BID   [START ON 05/20/2021] furosemide  40 mg Intravenous Daily   insulin aspart  0-9 Units Subcutaneous Q4H   insulin  glargine-yfgn  30 Units Subcutaneous BID   lactulose  30 g Oral TID   levothyroxine  100 mcg Oral QAC breakfast   LORazepam  0-4 mg Intravenous Q6H   Or   LORazepam  0-4 mg Oral Q6H   [START ON 05/21/2021] LORazepam  0-4 mg Intravenous Q12H   Or   [START ON 05/21/2021] LORazepam  0-4 mg Oral Q12H   rifaximin  550 mg Oral BID   sodium chloride flush  3 mL Intravenous Q12H   thiamine  100 mg Oral Daily   Or   thiamine  100 mg Intravenous Daily   Continuous Infusions:  sodium chloride      Assessment & Plan:   Active Problems:   Type 2 diabetes mellitus with peripheral neuropathy (HCC)   COPD (chronic obstructive pulmonary disease) (HCC)   Hypotension   PAF (paroxysmal atrial fibrillation) (HCC)   Hypothyroidism   Syncope   Thrombocytopenia (HCC)   Alcoholic cirrhosis of liver without ascites (HCC)  Chronic alcohol abuse   Gastroesophageal reflux disease without esophagitis   Acute respiratory failure (HCC)   Acute hepatic encephalopathy   Prolonged QT interval   70 y.o. male with medical history significant of Alcoholic cirrhosis of liver with ascites, Hepatic encephalopathy syndrome, hypothyrodism, bilateral PE, COPD, Dm2, A.fib, HLD, SLE,   peripheral neuropathy, CAD    Admitted for syncope, hypotension   Present on Admission:   Syncope -possibly secondary to transient hypotension.  Patient is third spacing, given a dose of albumin 9/2-negative for PE Echo pending TP negative Monitor bp May need to add midodrine   Afib rvr- On monitor with fib /flutter. On eliquis Bp on low side, once able to need to add beta blk Cardiology consulted-Dr. Kirke CorinArida team to see Echo pending    Alcoholic cirrhosis of liver without ascites (HCC) - Since hypotensive spironolactone held  Attempted gentle diuresing given anasarca on admission  Counseled about alcohol cessation but he does not appear to be interested to quit his 2-12 ounce beers daily  GI consulted -input  appreciated. Recommend avoiding NSAIDs.  Hold off on Lasix and Aldactone as he does not have ascites and he came in hypotensive Will obtain ultrasound to evaluate for ascites He has had large-volume paracentesis in the past and he reported over 5 L was taken out   Alcohol abuse- he reports now he is only drinking 2 12oz beer daily and doesn't appear to want to stop despite cousneling him On icwa protocal     Acute respiratory failure (HCC) thought to be possibly secondary to fluid overload in the emergency department received albumin if blood pressure allows can try gentle diuresis    Type 2 diabetes mellitus with peripheral neuropathy (HCC) -  - Order Sensitive   SSI   - continue home insulin regimen but decrease  Lantus  30 units bid,  -  check TSH and HgA1C  - Hold by mouth medications     Thrombocytopenia (HCC) likely secondary to liver disease          Hypothyroidism check TSH and continue Synthroid    Hypotension transient at baseline systolics in the 110 chronically tachycardic    Gastroesophageal reflux disease without esophagitis PPI    COPD (chronic obstructive pulmonary disease) (HCC) currently appears to be stable albuterol as needed        Acute hepatic encephalopathy -continue with lactulose repeat ammonia level    Diarrhea -seems that loose stools much worse than his baseline.  Check gastric panel and C. difficile given foul-smelling diarrhea which is frequent This may be contributing to hypotension   Prolonged qt - - will monitor on tele avoid QT prolonging medications, rehydrate correct electrolytes     DVT prophylaxis: Eliquis Code Status: Full Family Communication: None at bedside Disposition Plan:  Status is: Inpatient  Remains inpatient appropriate because:Inpatient level of care appropriate due to severity of illness  Dispo: The patient is from: Home              Anticipated d/c is to: Home              Patient currently is not medically  stable to d/c.   Difficult to place patient No            LOS: 1 day   Time spent: 35 minutes with more than 50% on COC    Lynn ItoSahar Bowie Doiron, MD [facility] Pager [phone]  If 7PM-7AM, please contact night-coverage 05/19/2021, 3:36 PM

## 2021-05-19 NOTE — Consult Note (Addendum)
[name] , MD [address], [address], [place], [place], [address] [address], [place], [place], [address] Phone: [phone]  Fax: [phone]  Consultation  Referring Provider:  Dr Marylu Lund Primary Care Physician:  Danella Penton, MD Primary Gastroenterologist: None          Reason for Consultation:     Alcoholic cirrhosis  Date of Admission:  05/18/2021 Date of Consultation:  05/19/2021         HPI:   Lawrence Osborn is a 70 y.o. male presented to the ER with loss of Consciousness with a past medical history of alcohol abuse, bilateral PE, COPD, atrial fibrillation.  He was accompanied by his wife in the ER.  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  He does admit to drinking 2 beers a day.  Used to drink much more.  His wife states that he has been having issues with speech, memory recently and suffered from severe constipation.  Took some lactulose and profuse diarrhea.  Not on Xifaxan.  Denies any change in bowel habits.  No rectal bleeding.  No illegal drug use.  Already established with a gastroenterologist.  On a low-salt diet.  05/18/2021: CT abdomen and pelvis showed small to moderate volume left pleural effusion, cirrhosis with portal hypertension and hepatic lesion seen.  Further evaluation with MRI has been recommended but the patient can lay still.   05/19/2021: CMP albumin 2.4 AST 39 ALT 15 total bilirubin 2.6, TSH 4.9 hemoglobin electrophoresis 12.9 , MCV 102 and platelets 68.   Past Medical History:  Diagnosis Date   Anemia    Cirrhosis of liver (HCC)    COPD (chronic obstructive pulmonary disease) (HCC)    Diabetes mellitus without complication (HCC)    DVT (deep venous thrombosis) (HCC)    mid calf to groin   GI bleed    Hypertension    Peritonitis (HCC)    Pneumothorax     Past Surgical History:  Procedure Laterality Date   APPENDECTOMY     COLONOSCOPY     DORSAL SLIT N/A 09/20/2020   Procedure: DORSAL  SLIT;  Surgeon: Riki Altes, MD;  Location: [facility] ORS;  Service: Urology;  Laterality: N/A;   HERNIA REPAIR      Prior to Admission medications   Medication Sig Start Date End Date Taking? Authorizing Provider  albuterol (PROVENTIL HFA;VENTOLIN HFA) 108 (90 Base) MCG/ACT inhaler Inhale 2 puffs into the lungs every 6 (six) hours as needed for wheezing or shortness of breath.   Yes [provider]  apixaban (ELIQUIS) 5 MG TABS tablet Take 1 tablet (5 mg total) by mouth 2 (two) times daily. 08/03/19  Yes Wieting, Richard, MD  Cyanocobalamin (B-12) 2500 MCG TABS Take 2,500 mcg by mouth daily.   Yes [provider]  ferrous gluconate (FERGON) 324 MG tablet Take 324 mg by mouth daily with breakfast.   Yes [provider]  furosemide (LASIX) 20 MG tablet Take 20 mg by mouth daily as needed for edema. 07/25/20  Yes [provider]  insulin glargine (LANTUS) 100 UNIT/ML injection Inject 35 Units into the skin 2 (two) times daily.   Yes [provider]  lactulose (CHRONULAC) 10 GM/15ML solution Take 20 g by mouth 2 (two) times daily. 04/18/21  Yes [provider]  levothyroxine (SYNTHROID, LEVOTHROID) 100 MCG tablet Take 100 mcg by mouth daily before breakfast.   Yes [provider]  nadolol (CORGARD) 40 MG tablet Take 2 tablets (80 mg total) by mouth daily. Patient taking differently: Take 40 mg by mouth daily. 08/03/19  Yes Wieting, Richard, MD  omega-3 fish oil (MAXEPA) 1000 MG CAPS capsule Take 1,000 mg by mouth 2 (two) times daily.   Yes [provider]  omeprazole (PRILOSEC) 20 MG capsule Take 20 mg by mouth daily.   Yes [provider]  QUEtiapine (SEROQUEL) 100 MG tablet Take 100-200 mg by mouth See admin instructions. Take 100 mg in the morning and 200 mg at bedtime   Yes [provider]  spironolactone (ALDACTONE) 50 MG tablet Take 50 mg by mouth daily. 04/18/21  Yes [provider]  traMADol  (ULTRAM) 50 MG tablet Take 50 mg by mouth 2 (two) times daily.   Yes [provider]  diltiazem (CARDIZEM CD) 180 MG 24 hr capsule Take 180 mg by mouth at bedtime. Patient not taking: Reported on 05/18/2021 07/06/20   [provider]  HYDROcodone-acetaminophen (NORCO/VICODIN) 5-325 MG tablet Take 1 tablet by mouth every 4 (four) hours as needed for moderate pain. Patient not taking: No sig reported 09/20/20   Riki AltesStoioff, Scott C, MD  Semaglutide, 1 MG/DOSE, (OZEMPIC, 1 MG/DOSE,) 2 MG/1.5ML SOPN Inject 1 mg into the skin every Sunday. Patient not taking: Reported on 05/18/2021    [provider]  ST JOHNS WORT PO Take 2 tablets by mouth 2 (two) times daily. Patient not taking: Reported on 05/18/2021    [provider]    Family History  Problem Relation Age of Onset   Hypothyroidism Mother    CAD Father      Social History   Tobacco Use   Smoking status: Former   Smokeless tobacco: Never  Substance Use Topics   Alcohol use: Yes    Alcohol/week: 22.0 standard drinks    Types: 1 Glasses of wine, 21 Cans of beer per week    Comment: 3 beers a day   Drug use: Not Currently    Allergies as of 05/18/2021   (No Known Allergies)    Review of Systems:    All systems reviewed and negative except where noted in HPI.   Physical Exam:  Vital signs in last 24 hours: Temp:  [97.5 F (36.4 C)] 97.5 F (36.4 C) (09/01 1535) Pulse Rate:  [101-118] 118 (09/02 1030) Resp:  [14-40] 20 (09/02 1030) BP: (77-131)/(55-94) 104/83 (09/02 1030) SpO2:  [91 %-98 %] 94 % (09/02 1030) Weight:  [124.3 kg] 124.3 kg (09/01 1538)   General:   Pleasant, cooperative in NAD Head:  Normocephalic and atraumatic. Eyes:   No icterus.   Conjunctiva pink. PERRLA. Ears:  Normal auditory acuity. Neck:  Supple; no masses or thyroidomegaly Lungs: Respirations even and unlabored. Lungs clear to auscultation bilaterally.   No wheezes, crackles, or rhonchi.  Heart:  Regular rate and rhythm;   Without murmur, clicks, rubs or gallops Abdomen:  Soft, significantly distended, free fluid present nontender. Normal bowel sounds. No appreciable masses or hepatomegaly.  No rebound or guarding.  Neurologic:  Alert and oriented x3;  grossly normal neurologically. Skin:  Intact without significant lesions or rashes. Cervical Nodes:  No significant cervical adenopathy. Psych:  Alert and cooperative. Normal affect.  LAB RESULTS: Recent Labs    05/18/21 1602 05/19/21 0410  WBC 5.5 4.2  HGB 15.2 12.9*  HCT 42.1 36.4*  PLT 94* 68*   BMET Recent Labs    05/18/21 1602 05/19/21 0410  NA 133* 135  K 4.4 3.9  CL 95* 100  CO2 31 30  GLUCOSE 116* 85  BUN 8 8  CREATININE 0.82 0.71  CALCIUM 8.8* 8.1*   LFT Recent Labs    05/18/21 1602 05/19/21 0410  PROT 6.8 5.8*  ALBUMIN 2.5* 2.4*  AST 50* 39  ALT 20 15  ALKPHOS 141* 106  BILITOT 3.5* 2.6*  BILIDIR 1.1*  --   IBILI 2.4*  --    PT/INR Recent Labs    05/18/21 1602  LABPROT 19.5*  INR 1.7*    STUDIES: DG Chest 2 View  Result Date: 05/18/2021 CLINICAL DATA:  Syncope. EXAM: CHEST - 2 VIEW COMPARISON:  April 11, 2021. FINDINGS: The heart size and mediastinal contours are within normal limits. No pneumothorax is noted. Right lung is clear. Small left pleural effusion is noted with adjacent left basilar atelectasis. The visualized skeletal structures are unremarkable. IMPRESSION: Small left pleural effusion is noted with adjacent left basilar atelectasis. Aortic Atherosclerosis (ICD10-I70.0). Electronically Signed   By: Lupita Raider M.D.   On: 05/18/2021 16:55   CT HEAD WO CONTRAST  Result Date: 05/18/2021 CLINICAL DATA:  Loss of consciousness after fall. Status post fall. Nonlocalized acute abdominal pain. EXAM: CT HEAD WITHOUT CONTRAST CT CERVICAL SPINE WITHOUT CONTRAST CT CHEST, ABDOMEN AND PELVIS WITH CONTRAST TECHNIQUE: Contiguous axial images were obtained from the base of the skull through the vertex without intravenous  contrast. Multidetector CT imaging of the cervical spine was performed without intravenous contrast. Multiplanar CT image reconstructions were also generated. Multidetector CT imaging of the chest, abdomen and pelvis was performed following the standard protocol during bolus administration of intravenous contrast. CONTRAST:  OMNIPAQUE IOHEXOL 350 MG/ML SOLN COMPARISON:  None. FINDINGS: CT HEAD FINDINGS BRAIN: BRAIN Cerebral ventricle sizes are concordant with the degree of cerebral volume loss. Patchy and confluent areas of decreased attenuation are noted throughout the deep and periventricular white matter of the cerebral hemispheres bilaterally, compatible with chronic microvascular ischemic disease. No evidence of large-territorial acute infarction. No parenchymal hemorrhage. No mass lesion. No extra-axial collection. No mass effect or midline shift. No hydrocephalus. Basilar cisterns are patent. Vascular: No hyperdense vessel. Atherosclerotic calcifications are present within the cavernous internal carotid arteries. Skull: No acute fracture or focal lesion. Sinuses/Orbits: Paranasal sinuses and mastoid air cells are clear. The orbits are unremarkable. Other: None. CT CERVICAL FINDINGS Alignment: Straightening of the normal cervical lordosis likely due to positioning and degenerative changes. Alignment is otherwise normal. Skull base and vertebrae: Moderate to severe multilevel degenerative changes of the spine. Associated multilevel severe osseous neural foraminal stenosis. No severe osseous central canal stenosis. No acute fracture. No aggressive appearing focal osseous lesion or focal pathologic process. Soft tissues and spinal canal: No prevertebral fluid or swelling. No visible canal hematoma. Upper chest: At least moderate volume left pleural effusion. No pneumothorax. Other: None. CHEST: Ports and Devices: None. Lungs/airways: Nonspecific thin walled cystic change of the right apex. Couple scattered  pulmonary micronodules. Passive atelectasis of the left lower lobe. Passive atelectasis of the lingula. No focal consolidation. No pulmonary mass. No pulmonary contusion or laceration. No pneumatocele formation. The central airways are patent. Pleura: Small to moderate volume left pleural effusion no right pleural effusion. No pneumothorax. No hemothorax. Lymph Nodes: No mediastinal, hilar, or axillary lymphadenopathy. Mediastinum: No pneumomediastinum. No aortic injury or mediastinal hematoma. The thoracic aorta is normal in caliber. Severe calcified and noncalcified atherosclerotic plaque of the thoracic aorta. At least three-vessel coronary calcifications. Mild aortic valve leaflet  calcifications. The heart is normal in size. No significant pericardial effusion. The main pulmonary artery is normal in caliber. No pulmonary embolus. The esophagus is unremarkable. The thyroid is unremarkable. Chest Wall / Breasts: No chest wall mass. Musculoskeletal: No acute rib or sternal fracture. No spinal fracture. ABDOMEN / PELVIS: Liver: Not enlarged. Nodular hepatic contour. No focal lesion. No laceration or subcapsular hematoma. Biliary System: Layering hyperdensity within gallbladder lumen consistent with radio-opaque gallstones. No biliary ductal dilatation. Pancreas: Normal pancreatic contour. No main pancreatic duct dilatation. Spleen: Enlarged in caliber measuring up to at least 18 cm. No focal lesion. No laceration, subcapsular hematoma, or vascular injury. Adrenal Glands: No nodularity bilaterally. Kidneys: Bilateral kidneys enhance symmetrically. No hydronephrosis. No contusion, laceration, or subcapsular hematoma. Exophytic 1.7 cm fluid density lesion within the right kidney likely represents a simple renal cyst. Similar finding on the left measuring up to 1.8 cm. No injury to the vascular structures or collecting systems. No hydroureter. The urinary bladder is unremarkable. Bowel: No small or large bowel wall  thickening or dilatation. The appendix is not definitely identified. Mesentery, Omentum, and Peritoneum: Small to moderate volume simple free fluid ascites. No pneumoperitoneum. No hemoperitoneum. No mesenteric hematoma identified. No organized fluid collection. Pelvic Organs: Normal. Lymph Nodes: No abdominal, pelvic, inguinal lymphadenopathy. Vasculature: Recanalized paraumbilical vein. Upper abdominal venous collaterals. Small esophageal varices. The main portal, splenic, superior mesenteric veins are patent. No abdominal aorta or iliac aneurysm. No active contrast extravasation or pseudoaneurysm. Musculoskeletal: No significant soft tissue hematoma. No acute pelvic fracture. No spinal fracture. IMPRESSION: 1. No acute intracranial abnormality. 2. 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 3.  No acute traumatic injury to the chest, abdomen, or pelvis. 4. No acute fracture or traumatic malalignment of the thoracic or lumbar spine. Other imaging findings of potential clinical significance: 1. No pulmonary embolus. 2. Small to moderate volume left pleural effusion. 3. Cirrhosis with portal hypertension. No focal hepatic lesion on this single-phase portal venous study. Recommend nonemergent MRI liver protocol for further evaluation. When the patient is clinically stable and able to follow directions and hold their breath (preferably as an outpatient) further evaluation with dedicated abdominal MRI should be considered. 4. Cholelithiasis no findings of acute cholecystitis. 5. Aortic Atherosclerosis (ICD10-I70.0). Including at least 3 vessel coronary artery calcifications as well as aortic valve leaflet calcifications. Correlate with aortic stenosis. Electronically Signed   By: Tish Frederickson M.D.   On: 05/18/2021 18:37   CT Angio Chest PE W and/or Wo Contrast  Result Date:  05/18/2021 CLINICAL DATA:  Loss of consciousness after fall. Status post fall. Nonlocalized acute abdominal pain. EXAM: CT HEAD WITHOUT CONTRAST CT CERVICAL SPINE WITHOUT CONTRAST CT CHEST, ABDOMEN AND PELVIS WITH CONTRAST TECHNIQUE: Contiguous axial images were obtained from the base of the skull through the vertex without intravenous contrast. Multidetector CT imaging of the cervical spine was performed without intravenous contrast. Multiplanar CT image reconstructions were also generated. Multidetector CT imaging of the chest, abdomen and pelvis was performed following the standard protocol during bolus administration of intravenous contrast. CONTRAST:  OMNIPAQUE IOHEXOL 350 MG/ML SOLN COMPARISON:  None. FINDINGS: CT HEAD FINDINGS BRAIN: BRAIN Cerebral ventricle sizes are concordant with the degree of cerebral volume loss. Patchy and confluent areas of decreased attenuation are noted throughout the deep and periventricular white matter of the cerebral hemispheres bilaterally, compatible with chronic microvascular ischemic disease. No evidence  of large-territorial acute infarction. No parenchymal hemorrhage. No mass lesion. No extra-axial collection. No mass effect or midline shift. No hydrocephalus. Basilar cisterns are patent. Vascular: No hyperdense vessel. Atherosclerotic calcifications are present within the cavernous internal carotid arteries. Skull: No acute fracture or focal lesion. Sinuses/Orbits: Paranasal sinuses and mastoid air cells are clear. The orbits are unremarkable. Other: None. CT CERVICAL FINDINGS Alignment: Straightening of the normal cervical lordosis likely due to positioning and degenerative changes. Alignment is otherwise normal. Skull base and vertebrae: Moderate to severe multilevel degenerative changes of the spine. Associated multilevel severe osseous neural foraminal stenosis. No severe osseous central canal stenosis. No acute fracture. No aggressive appearing focal osseous  lesion or focal pathologic process. Soft tissues and spinal canal: No prevertebral fluid or swelling. No visible canal hematoma. Upper chest: At least moderate volume left pleural effusion. No pneumothorax. Other: None. CHEST: Ports and Devices: None. Lungs/airways: Nonspecific thin walled cystic change of the right apex. Couple scattered pulmonary micronodules. Passive atelectasis of the left lower lobe. Passive atelectasis of the lingula. No focal consolidation. No pulmonary mass. No pulmonary contusion or laceration. No pneumatocele formation. The central airways are patent. Pleura: Small to moderate volume left pleural effusion no right pleural effusion. No pneumothorax. No hemothorax. Lymph Nodes: No mediastinal, hilar, or axillary lymphadenopathy. Mediastinum: No pneumomediastinum. No aortic injury or mediastinal hematoma. The thoracic aorta is normal in caliber. Severe calcified and noncalcified atherosclerotic plaque of the thoracic aorta. At least three-vessel coronary calcifications. Mild aortic valve leaflet calcifications. The heart is normal in size. No significant pericardial effusion. The main pulmonary artery is normal in caliber. No pulmonary embolus. The esophagus is unremarkable. The thyroid is unremarkable. Chest Wall / Breasts: No chest wall mass. Musculoskeletal: No acute rib or sternal fracture. No spinal fracture. ABDOMEN / PELVIS: Liver: Not enlarged. Nodular hepatic contour. No focal lesion. No laceration or subcapsular hematoma. Biliary System: Layering hyperdensity within gallbladder lumen consistent with radio-opaque gallstones. No biliary ductal dilatation. Pancreas: Normal pancreatic contour. No main pancreatic duct dilatation. Spleen: Enlarged in caliber measuring up to at least 18 cm. No focal lesion. No laceration, subcapsular hematoma, or vascular injury. Adrenal Glands: No nodularity bilaterally. Kidneys: Bilateral kidneys enhance symmetrically. No hydronephrosis. No contusion,  laceration, or subcapsular hematoma. Exophytic 1.7 cm fluid density lesion within the right kidney likely represents a simple renal cyst. Similar finding on the left measuring up to 1.8 cm. No injury to the vascular structures or collecting systems. No hydroureter. The urinary bladder is unremarkable. Bowel: No small or large bowel wall thickening or dilatation. The appendix is not definitely identified. Mesentery, Omentum, and Peritoneum: Small to moderate volume simple free fluid ascites. No pneumoperitoneum. No hemoperitoneum. No mesenteric hematoma identified. No organized fluid collection. Pelvic Organs: Normal. Lymph Nodes: No abdominal, pelvic, inguinal lymphadenopathy. Vasculature: Recanalized paraumbilical vein. Upper abdominal venous collaterals. Small esophageal varices. The main portal, splenic, superior mesenteric veins are patent. No abdominal aorta or iliac aneurysm. No active contrast extravasation or pseudoaneurysm. Musculoskeletal: No significant soft tissue hematoma. No acute pelvic fracture. No spinal fracture. IMPRESSION: 1. No acute intracranial abnormality. 2. 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 3.  No acute traumatic injury to the chest, abdomen, or pelvis. 4. No acute fracture or traumatic malalignment of the thoracic or lumbar spine. Other imaging findings of potential clinical significance: 1. No pulmonary embolus. 2. Small to moderate volume left pleural effusion.  3. Cirrhosis with portal hypertension. No focal hepatic lesion on this single-phase portal venous study. Recommend nonemergent MRI liver protocol for further evaluation. When the patient is clinically stable and able to follow directions and hold their breath (preferably as an outpatient) further evaluation with dedicated abdominal MRI should be considered. 4. Cholelithiasis no findings of acute  cholecystitis. 5. Aortic Atherosclerosis (ICD10-I70.0). Including at least 3 vessel coronary artery calcifications as well as aortic valve leaflet calcifications. Correlate with aortic stenosis. Electronically Signed   By: Tish Frederickson M.D.   On: 05/18/2021 18:37   CT Cervical Spine Wo Contrast  Result Date: 05/18/2021 CLINICAL DATA:  Loss of consciousness after fall. Status post fall. Nonlocalized acute abdominal pain. EXAM: CT HEAD WITHOUT CONTRAST CT CERVICAL SPINE WITHOUT CONTRAST CT CHEST, ABDOMEN AND PELVIS WITH CONTRAST TECHNIQUE: Contiguous axial images were obtained from the base of the skull through the vertex without intravenous contrast. Multidetector CT imaging of the cervical spine was performed without intravenous contrast. Multiplanar CT image reconstructions were also generated. Multidetector CT imaging of the chest, abdomen and pelvis was performed following the standard protocol during bolus administration of intravenous contrast. CONTRAST:  OMNIPAQUE IOHEXOL 350 MG/ML SOLN COMPARISON:  None. FINDINGS: CT HEAD FINDINGS BRAIN: BRAIN Cerebral ventricle sizes are concordant with the degree of cerebral volume loss. Patchy and confluent areas of decreased attenuation are noted throughout the deep and periventricular white matter of the cerebral hemispheres bilaterally, compatible with chronic microvascular ischemic disease. No evidence of large-territorial acute infarction. No parenchymal hemorrhage. No mass lesion. No extra-axial collection. No mass effect or midline shift. No hydrocephalus. Basilar cisterns are patent. Vascular: No hyperdense vessel. Atherosclerotic calcifications are present within the cavernous internal carotid arteries. Skull: No acute fracture or focal lesion. Sinuses/Orbits: Paranasal sinuses and mastoid air cells are clear. The orbits are unremarkable. Other: None. CT CERVICAL FINDINGS Alignment: Straightening of the normal cervical lordosis likely due to  positioning and degenerative changes. Alignment is otherwise normal. Skull base and vertebrae: Moderate to severe multilevel degenerative changes of the spine. Associated multilevel severe osseous neural foraminal stenosis. No severe osseous central canal stenosis. No acute fracture. No aggressive appearing focal osseous lesion or focal pathologic process. Soft tissues and spinal canal: No prevertebral fluid or swelling. No visible canal hematoma. Upper chest: At least moderate volume left pleural effusion. No pneumothorax. Other: None. CHEST: Ports and Devices: None. Lungs/airways: Nonspecific thin walled cystic change of the right apex. Couple scattered pulmonary micronodules. Passive atelectasis of the left lower lobe. Passive atelectasis of the lingula. No focal consolidation. No pulmonary mass. No pulmonary contusion or laceration. No pneumatocele formation. The central airways are patent. Pleura: Small to moderate volume left pleural effusion no right pleural effusion. No pneumothorax. No hemothorax. Lymph Nodes: No mediastinal, hilar, or axillary lymphadenopathy. Mediastinum: No pneumomediastinum. No aortic injury or mediastinal hematoma. The thoracic aorta is normal in caliber. Severe calcified and noncalcified atherosclerotic plaque of the thoracic aorta. At least three-vessel coronary calcifications. Mild aortic valve leaflet calcifications. The heart is normal in size. No significant pericardial effusion. The main pulmonary artery is normal in caliber. No pulmonary embolus. The esophagus is unremarkable. The thyroid is unremarkable. Chest Wall / Breasts: No chest wall mass. Musculoskeletal: No acute rib or sternal fracture. No spinal fracture. ABDOMEN / PELVIS: Liver: Not enlarged. Nodular hepatic contour. No focal lesion. No laceration or subcapsular hematoma. Biliary System: Layering hyperdensity within gallbladder lumen consistent with radio-opaque gallstones. No biliary ductal dilatation. Pancreas:  Normal pancreatic contour. No main  pancreatic duct dilatation. Spleen: Enlarged in caliber measuring up to at least 18 cm. No focal lesion. No laceration, subcapsular hematoma, or vascular injury. Adrenal Glands: No nodularity bilaterally. Kidneys: Bilateral kidneys enhance symmetrically. No hydronephrosis. No contusion, laceration, or subcapsular hematoma. Exophytic 1.7 cm fluid density lesion within the right kidney likely represents a simple renal cyst. Similar finding on the left measuring up to 1.8 cm. No injury to the vascular structures or collecting systems. No hydroureter. The urinary bladder is unremarkable. Bowel: No small or large bowel wall thickening or dilatation. The appendix is not definitely identified. Mesentery, Omentum, and Peritoneum: Small to moderate volume simple free fluid ascites. No pneumoperitoneum. No hemoperitoneum. No mesenteric hematoma identified. No organized fluid collection. Pelvic Organs: Normal. Lymph Nodes: No abdominal, pelvic, inguinal lymphadenopathy. Vasculature: Recanalized paraumbilical vein. Upper abdominal venous collaterals. Small esophageal varices. The main portal, splenic, superior mesenteric veins are patent. No abdominal aorta or iliac aneurysm. No active contrast extravasation or pseudoaneurysm. Musculoskeletal: No significant soft tissue hematoma. No acute pelvic fracture. No spinal fracture. IMPRESSION: 1. No acute intracranial abnormality. 2. 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 3.  No acute traumatic injury to the chest, abdomen, or pelvis. 4. No acute fracture or traumatic malalignment of the thoracic or lumbar spine. Other imaging findings of potential clinical significance: 1. No pulmonary embolus. 2. Small to moderate volume left pleural effusion. 3. Cirrhosis with portal hypertension. No focal hepatic lesion on this  single-phase portal venous study. Recommend nonemergent MRI liver protocol for further evaluation. When the patient is clinically stable and able to follow directions and hold their breath (preferably as an outpatient) further evaluation with dedicated abdominal MRI should be considered. 4. Cholelithiasis no findings of acute cholecystitis. 5. Aortic Atherosclerosis (ICD10-I70.0). Including at least 3 vessel coronary artery calcifications as well as aortic valve leaflet calcifications. Correlate with aortic stenosis. Electronically Signed   By: Tish Frederickson M.D.   On: 05/18/2021 18:37   CT ABDOMEN PELVIS W CONTRAST  Result Date: 05/18/2021 CLINICAL DATA:  Loss of consciousness after fall. Status post fall. Nonlocalized acute abdominal pain. EXAM: CT HEAD WITHOUT CONTRAST CT CERVICAL SPINE WITHOUT CONTRAST CT CHEST, ABDOMEN AND PELVIS WITH CONTRAST TECHNIQUE: Contiguous axial images were obtained from the base of the skull through the vertex without intravenous contrast. Multidetector CT imaging of the cervical spine was performed without intravenous contrast. Multiplanar CT image reconstructions were also generated. Multidetector CT imaging of the chest, abdomen and pelvis was performed following the standard protocol during bolus administration of intravenous contrast. CONTRAST:  OMNIPAQUE IOHEXOL 350 MG/ML SOLN COMPARISON:  None. FINDINGS: CT HEAD FINDINGS BRAIN: BRAIN Cerebral ventricle sizes are concordant with the degree of cerebral volume loss. Patchy and confluent areas of decreased attenuation are noted throughout the deep and periventricular white matter of the cerebral hemispheres bilaterally, compatible with chronic microvascular ischemic disease. No evidence of large-territorial acute infarction. No parenchymal hemorrhage. No mass lesion. No extra-axial collection. No mass effect or midline shift. No hydrocephalus. Basilar cisterns are patent. Vascular: No hyperdense vessel. Atherosclerotic  calcifications are present within the cavernous internal carotid arteries. Skull: No acute fracture or focal lesion. Sinuses/Orbits: Paranasal sinuses and mastoid air cells are clear. The orbits are unremarkable. Other: None. CT CERVICAL FINDINGS Alignment: Straightening of the normal cervical lordosis likely due to positioning and degenerative changes. Alignment is otherwise normal. Skull base and vertebrae: Moderate to severe  multilevel degenerative changes of the spine. Associated multilevel severe osseous neural foraminal stenosis. No severe osseous central canal stenosis. No acute fracture. No aggressive appearing focal osseous lesion or focal pathologic process. Soft tissues and spinal canal: No prevertebral fluid or swelling. No visible canal hematoma. Upper chest: At least moderate volume left pleural effusion. No pneumothorax. Other: None. CHEST: Ports and Devices: None. Lungs/airways: Nonspecific thin walled cystic change of the right apex. Couple scattered pulmonary micronodules. Passive atelectasis of the left lower lobe. Passive atelectasis of the lingula. No focal consolidation. No pulmonary mass. No pulmonary contusion or laceration. No pneumatocele formation. The central airways are patent. Pleura: Small to moderate volume left pleural effusion no right pleural effusion. No pneumothorax. No hemothorax. Lymph Nodes: No mediastinal, hilar, or axillary lymphadenopathy. Mediastinum: No pneumomediastinum. No aortic injury or mediastinal hematoma. The thoracic aorta is normal in caliber. Severe calcified and noncalcified atherosclerotic plaque of the thoracic aorta. At least three-vessel coronary calcifications. Mild aortic valve leaflet calcifications. The heart is normal in size. No significant pericardial effusion. The main pulmonary artery is normal in caliber. No pulmonary embolus. The esophagus is unremarkable. The thyroid is unremarkable. Chest Wall / Breasts: No chest wall mass. Musculoskeletal: No  acute rib or sternal fracture. No spinal fracture. ABDOMEN / PELVIS: Liver: Not enlarged. Nodular hepatic contour. No focal lesion. No laceration or subcapsular hematoma. Biliary System: Layering hyperdensity within gallbladder lumen consistent with radio-opaque gallstones. No biliary ductal dilatation. Pancreas: Normal pancreatic contour. No main pancreatic duct dilatation. Spleen: Enlarged in caliber measuring up to at least 18 cm. No focal lesion. No laceration, subcapsular hematoma, or vascular injury. Adrenal Glands: No nodularity bilaterally. Kidneys: Bilateral kidneys enhance symmetrically. No hydronephrosis. No contusion, laceration, or subcapsular hematoma. Exophytic 1.7 cm fluid density lesion within the right kidney likely represents a simple renal cyst. Similar finding on the left measuring up to 1.8 cm. No injury to the vascular structures or collecting systems. No hydroureter. The urinary bladder is unremarkable. Bowel: No small or large bowel wall thickening or dilatation. The appendix is not definitely identified. Mesentery, Omentum, and Peritoneum: Small to moderate volume simple free fluid ascites. No pneumoperitoneum. No hemoperitoneum. No mesenteric hematoma identified. No organized fluid collection. Pelvic Organs: Normal. Lymph Nodes: No abdominal, pelvic, inguinal lymphadenopathy. Vasculature: Recanalized paraumbilical vein. Upper abdominal venous collaterals. Small esophageal varices. The main portal, splenic, superior mesenteric veins are patent. No abdominal aorta or iliac aneurysm. No active contrast extravasation or pseudoaneurysm. Musculoskeletal: No significant soft tissue hematoma. No acute pelvic fracture. No spinal fracture. IMPRESSION: 1. No acute intracranial abnormality. 2. 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 3.  No acute traumatic injury to the  chest, abdomen, or pelvis. 4. No acute fracture or traumatic malalignment of the thoracic or lumbar spine. Other imaging findings of potential clinical significance: 1. No pulmonary embolus. 2. Small to moderate volume left pleural effusion. 3. Cirrhosis with portal hypertension. No focal hepatic lesion on this single-phase portal venous study. Recommend nonemergent MRI liver protocol for further evaluation. When the patient is clinically stable and able to follow directions and hold their breath (preferably as an outpatient) further evaluation with dedicated abdominal MRI should be considered. 4. Cholelithiasis no findings of acute cholecystitis. 5. Aortic Atherosclerosis (ICD10-I70.0). Including at least 3 vessel coronary artery calcifications as well as aortic valve leaflet calcifications. Correlate with aortic stenosis. Electronically Signed   By: Tish Frederickson  M.D.   On: 05/18/2021 18:37   US Carotid Bilateral  Result Date: 05/18/2021 CLINICAL DATA:  Syncope and collapse. EXAM: BILATERAL CAROTID DUPLEX ULTRASOUND TECHNIQUE: Wallace Cullens scale imaging, color Doppler and duplex ultrasound were performed of bilateral carotid and vertebral arteries in the neck. COMPARISON:  None. FINDINGS: Criteria: Quantification of carotid stenosis is based on velocity parameters that correlate the residual internal carotid diameter with NASCET-based stenosis levels, using the diameter of the distal internal carotid lumen as the denominator for stenosis measurement. The following velocity measurements were obtained: RIGHT ICA: 59 cm/sec CCA: 87 cm/sec SYSTOLIC ICA/CCA RATIO:  0.7 ECA: 67 cm/sec LEFT ICA: 76 cm/sec CCA: 69 cm/sec SYSTOLIC ICA/CCA RATIO:  1.1 ECA: 70 cm/sec RIGHT CAROTID ARTERY: Minimal atherosclerotic plaque in the right carotid bulb extending into the proximal ICA. There is atherosclerotic plaque in the external carotid artery. RIGHT VERTEBRAL ARTERY:  Patent with anteverted flow. LEFT CAROTID ARTERY: Mild plaque in  the carotid bulb extending into the proximal ICA. LEFT VERTEBRAL ARTERY: There appears to be somewhat retrograde flow in the left vertebral artery. The left subclavian artery appears patent with normal direction. Focal stenosis at the origin of the left subclavian artery is not excluded. CT angiography may provide better evaluation. Upper extremity blood pressures: RIGHT: 110/71 LEFT: 105/78 IMPRESSION: 1. Less than 50% ICA stenosis bilaterally. 2. Retrograde appearance of flow in the left vertebral artery suspicious for subclavian steal syndrome. CT angiography is recommended to evaluate for possible proximal subclavian artery stenosis. Electronically Signed   By: Elgie Collard M.D.   On: 05/18/2021 22:26      Impression / Plan:   Lawrence Osborn is a 70 y.o. 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  Presented with syncopal episode  Plan 1.  Hepatic encephalopathy: Continue Xifaxan if having diarrhea hold lactulose.  Profuse diarrhea and dehydration can worsen hepatic encephalopathy.  Monitor electrolytes and replace if low.  Avoid benzodiazepines and narcotics.  2.  Low-salt diet.  3.  Hold off on Lasix and Aldactone as he does not have ascites and he came in hypotensive.  Avoid NSAIDs.  Obtain ultrasound to evaluate for ascites.  He has had a large-volume paracentesis in the past and he reports over 5 L was taken out.  4.  As an outpatient he will need an EGD to screen for esophageal varices and an MRI to evaluate for liver masses as recommended by radiologist.   5.  As an outpatient will need full autoimmune and viral hepatitis work-up  6.  I did discuss about alcohol abstinence and need to join alcoholic Anonymous.  7.  Suggested him to follow-up with me as an outpatient.  8.   Alcohol withdrawal protocol as well as IV thiamine to prevent Wernicke's encephalopathy   Thank you for involving me in the care of this patient.      LOS: 1 day    [name], MD  05/19/2021, 10:55 AM

## 2021-05-19 NOTE — Consult Note (Signed)
Cardiology Consultation:   Patient ID: Lawrence Osborn MRN: [id number]; DOB: 06-28-1951  Admit date: 05/18/2021 Date of Consult: 05/19/2021  PCP:  Lawrence Aus, MD   [place]  Cardiologist:   Advanced Practice Provider:  No care team member to display Electrophysiologist:  None [id number]    Patient Profile:   Lawrence Osborn is a 70 y.o. 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  History of Present Illness:   Lawrence Osborn is a 70 year old male with PMH as above.  He does not have a regular cardiologist and follows with Lawrence Osborn for his cardiac medications/A. fib. He was previously seen by [facility] cardiology as an IP in 2019 during an [facility] admission for weakness and falls.  Ethanol level was elevated.  He ruled out for myocardial infarction.  Echo showed EF 50 to 55% as below.  Carotids with mild stenosis.  Etiology of fall was unclear.  He was initially hypotensive at presentation but improved with hydration.  It was thought that the etiology of his fall could be transient atrial fibrillation with RVR.  Outpatient Holter monitor was suggested.  Ethanol cessation was discussed.  He was continued on nadolol in digoxin.  It was recommended that anticoagulation be held due to frequent falls and heavy ethanol intake; however, today, the patient reports that he has continued anticoagulation since that time. He was last seen by his PCP 05/10/2021 for alcohol liver cirrhosis and denied alcohol within the last month. Of note, today, he reports today that he currently drinks 2 beers per day and has not stopped drinking.   He was continued on Lasix, spironolactone, and Eliquis.. Of note, he does have a history of frequent diarrhea on lactulose for liver  cirrhosis/hepatic encephalopathy.  He reports recent loose and black stools -more so than his usual GI distress.  As a result, he self discontinued his lactulose on 8/31.  He presented to [facility] 05/18/21 after 4 recent falls with loss of consciousness during each fall.  On 05/16/2021, he reports syncope x2 at home in his living room (witnessed by his wife) at approximately 8:30 PM at night and before bed.  He remembers trying to grab onto the couch and then lost consciousness.  He denies any preceding dizziness.  He denies all reviewed preceding symptoms including chest pain, presyncope, racing heart rate, palpitations, diaphoresis, or shortness of breath.  He denies any symptoms following regain of consciousness.  He is uncertain of the amount of time he was unconscious.  He denies any coughing/sneezing fits, diarrhea/constipation, warmth, or pain immediately preceding his LOC. On 05/17/2021, he reports another syncope x2 (during which he hit his head x2) when going to a steakhouse in [facility].  He was getting out of the car and on his way into the steakhouse when he had another episode of syncope.  He is uncertain of the amount of time unconscious.  He reports hitting the front of his head during this time.  He then went inside the restaurant.  He decided against eating there and was on his way out of the restaurant into his car when he had another episode of syncope.  He again hit his head, though this time he slammed the back of  his head against concrete.  As above, no reported preceding symptoms before LOC and no reported residual symptoms/weakness after regaining consciousness.  He does admit to poor intake of food prior to his 8/31 falls.  Following the above for falls, he states he initially declined going to the emergency department; however, he eventually decided that he needed to seek medical attention when he continued to feel unsteady at home.  In the ED 9/1, initial vitals significant for  hypotension, atrial tachycardia, hypoxia with BP 77/55, HR 117 bpm, 92% RA.   --Labs showed sodium 133, glucose 116, RBC 4.18, platelets 94, albumin 2.5, AST 50, alk phosphatase 141, total bilirubin 3.5, lactic acid 2.6, BNP 318.3.   --EKG showed Atrial flutter with 2-1 conduction, RBBB with QRS 132 ms, poor R wave progression in inferior leads and possible previous inferior and anterior lateral infarct.  When compared with his previous EKG from 03/2021, EKG was without acute ST/T changes.   --Carotid US without significant stenosis but showed retrograde appearance of the flow in the left vertebral artery suspicious for subclavian steal syndrome with recommendation for CTA to evaluate for possible proximal subclavian artery stenosis.  Less than 50% ICA stenosis bilaterally.  --Abdomen US showed moderate ascites in the left upper and lower quadrants.   --Chest CTA negative for PE and showed moderate left pleural effusion, cirrhosis with recommendation for abdominal MRI in the future, cholelithiasis, aortic atherosclerosis, and 3v CAC, and calcifications of the aortic valve with recommendation to correlate with aortic stenosis. --Head CT without acute findings.  Today, he denies any chest pain, shortness of breath, presyncope, or tachypalpitations.  He reports he feels in his usual state of health.  He denies any recent hematochezia, hemoptysis, hematuria.  He does report recent dark stools/diarrhea as above.  He reports medication compliance other than his lactulose and including anticoagulation.  He has not missed any doses of Eliquis within the last month.  He is usually asymptomatic with his atrial fibrillation.  Past Medical History:  Diagnosis Date   Anemia    Cirrhosis of liver (HCC)    COPD (chronic obstructive pulmonary disease) ([place])    Diabetes mellitus without complication ([place])    DVT (deep venous thrombosis) (HCC)    mid calf to groin   GI bleed    Hypertension    Peritonitis ([place])     Pneumothorax     Past Surgical History:  Procedure Laterality Date   APPENDECTOMY     COLONOSCOPY     DORSAL SLIT N/A 09/20/2020   Procedure: DORSAL SLIT;  Surgeon: Abbie Sons, MD;  Location: [facility] ORS;  Service: Urology;  Laterality: N/A;   HERNIA REPAIR       Home Medications:  Prior to Admission medications   Medication Sig Start Date End Date Taking? Authorizing Provider  albuterol (PROVENTIL HFA;VENTOLIN HFA) 108 (90 Base) MCG/ACT inhaler Inhale 2 puffs into the lungs every 6 (six) hours as needed for wheezing or shortness of breath.   Yes [provider]  apixaban (ELIQUIS) 5 MG TABS tablet Take 1 tablet (5 mg total) by mouth 2 (two) times daily. 08/03/19  Yes Wieting, Richard, MD  Cyanocobalamin (B-12) 2500 MCG TABS Take 2,500 mcg by mouth daily.   Yes [provider]  ferrous gluconate (FERGON) 324 MG tablet Take 324 mg by mouth daily with breakfast.   Yes [provider]  furosemide (LASIX) 20 MG tablet Take 20 mg by mouth daily as needed for edema. 07/25/20  Yes  [provider]  insulin glargine (LANTUS) 100 UNIT/ML injection Inject 35 Units into the skin 2 (two) times daily.   Yes [provider]  lactulose (CHRONULAC) 10 GM/15ML solution Take 20 g by mouth 2 (two) times daily. 04/18/21  Yes [provider]  levothyroxine (SYNTHROID, LEVOTHROID) 100 MCG tablet Take 100 mcg by mouth daily before breakfast.   Yes [provider]  nadolol (CORGARD) 40 MG tablet Take 2 tablets (80 mg total) by mouth daily. Patient taking differently: Take 40 mg by mouth daily. 08/03/19  Yes Wieting, Richard, MD  omega-3 fish oil (MAXEPA) 1000 MG CAPS capsule Take 1,000 mg by mouth 2 (two) times daily.   Yes [provider]  omeprazole (PRILOSEC) 20 MG capsule Take 20 mg by mouth daily.   Yes [provider]  QUEtiapine (SEROQUEL) 100 MG tablet Take 100-200 mg by mouth See admin instructions. Take 100 mg in the morning  and 200 mg at bedtime   Yes [provider]  spironolactone (ALDACTONE) 50 MG tablet Take 50 mg by mouth daily. 04/18/21  Yes [provider]  traMADol (ULTRAM) 50 MG tablet Take 50 mg by mouth 2 (two) times daily.   Yes [provider]  diltiazem (CARDIZEM CD) 180 MG 24 hr capsule Take 180 mg by mouth at bedtime. Patient not taking: Reported on 05/18/2021 07/06/20   [provider]  HYDROcodone-acetaminophen (NORCO/VICODIN) 5-325 MG tablet Take 1 tablet by mouth every 4 (four) hours as needed for moderate pain. Patient not taking: No sig reported 09/20/20   Abbie Sons, MD  Semaglutide, 1 MG/DOSE, (OZEMPIC, 1 MG/DOSE,) 2 MG/1.5ML SOPN Inject 1 mg into the skin every Sunday. Patient not taking: Reported on 05/18/2021    [provider]  ST JOHNS WORT PO Take 2 tablets by mouth 2 (two) times daily. Patient not taking: Reported on 05/18/2021    [provider]    Inpatient Medications: Scheduled Meds:  apixaban  5 mg Oral BID   insulin aspart  0-9 Units Subcutaneous Q4H   insulin glargine-yfgn  30 Units Subcutaneous BID   lactulose  30 g Oral TID   levothyroxine  100 mcg Oral QAC breakfast   LORazepam  0-4 mg Intravenous Q6H   Or   LORazepam  0-4 mg Oral Q6H   [START ON 05/21/2021] LORazepam  0-4 mg Intravenous Q12H   Or   [START ON 05/21/2021] LORazepam  0-4 mg Oral Q12H   midodrine  5 mg Oral TID WC   rifaximin  550 mg Oral BID   sodium chloride flush  3 mL Intravenous Q12H   thiamine  100 mg Oral Daily   Or   thiamine  100 mg Intravenous Daily   Continuous Infusions:  sodium chloride     PRN Meds: sodium chloride, albuterol, sodium chloride flush  Allergies:   No Known Allergies  Social History:   Social History   Socioeconomic History   Marital status: Married    Spouse name: Not on file   Number of children: Not on file   Years of education: Not on file   Highest education level: Not on file  Occupational History   Not  on file  Tobacco Use   Smoking status: Former   Smokeless tobacco: Never  Substance and Sexual Activity   Alcohol use: Yes    Alcohol/week: 22.0 standard drinks    Types: 1 Glasses of wine, 21 Cans of beer per week    Comment: 3 beers  a day   Drug use: Not Currently   Sexual activity: Yes  Other Topics Concern   Not on file  Social History Narrative   Not on file   Social Determinants of Health   Financial Resource Strain: Not on file  Food Insecurity: Not on file  Transportation Needs: Not on file  Physical Activity: Not on file  Stress: Not on file  Social Connections: Not on file  Intimate Partner Violence: Not on file    Family History:    Family History  Problem Relation Age of Onset   Hypothyroidism Mother    CAD Father      ROS:  Please see the history of present illness.  Review of Systems  Constitutional:  Negative for diaphoresis, fever and malaise/fatigue.  Eyes:  Negative for blurred vision.  Respiratory:  Negative for cough, hemoptysis, shortness of breath and wheezing.   Cardiovascular:  Negative for chest pain, palpitations, orthopnea, leg swelling and PND.  Gastrointestinal:  Positive for diarrhea and melena. Negative for blood in stool and vomiting.  Genitourinary:  Negative for hematuria.  Musculoskeletal:  Positive for falls.  Neurological:  Negative for dizziness, sensory change, speech change, focal weakness, loss of consciousness, weakness and headaches.  All other systems reviewed and are negative.  All other ROS reviewed and negative.     Physical Exam/Data:   Vitals:   05/19/21 1000 05/19/21 1030 05/19/21 1100 05/19/21 1223  BP: 113/72 104/83 (!) 115/103 122/84  Pulse: (!) 101 (!) 118 (!) 118 (!) 118  Resp: (!) 21 20 (!) 21 18  Temp:      TempSrc:      SpO2: 91% 94% 91% 94%  Weight:      Height:       No intake or output data in the 24 hours ending 05/19/21 1548 Last 3 Weights 05/18/2021 04/11/2021 09/20/2020  Weight (lbs) 274 lb 288  lb 1.6 oz 245 lb  Weight (kg) 124.286 kg 130.681 kg 111.131 kg     Body mass index is 41.66 kg/m.  General: Obese male, NAD HEENT: normal Lymph: no adenopathy Neck: JVD difficult to assess due to body habitus Vascular: Radial pulses 2+ bilaterally  Cardiac:  normal S1, S2; IR IR and tachycardic; 2/6 systolic murmur best appreciated RUSB Lungs: Reduced breath sounds LLL Abd: Firm, distended Ext: no edema Musculoskeletal:  No deformities, BUE and BLE strength normal and equal Skin: warm and dry  Neuro:  no focal abnormalities noted Psych:  Normal affect   EKG:  The EKG was personally reviewed and demonstrates:  Atrial flutter with 2-1 conduction, RBBB with QRS 132 ms, poor R wave progression in inferior leads and possible previous inferior and anterior lateral infarct Telemetry:  Telemetry was personally reviewed and demonstrates: Artifact, atrial fibrillation with ventricular rates predominantly in the 120-130s  Relevant CV Studies:  Carotids 05/18/2021 IMPRESSION: 1. Less than 50% ICA stenosis bilaterally. 2. Retrograde appearance of flow in the left vertebral artery suspicious for subclavian steal syndrome. CT angiography is recommended to evaluate for possible proximal subclavian artery stenosis.  Echo 08/2018 - Left ventricle: Systolic function was normal. The estimated    ejection fraction was in the range of 50% to 55%.  - Aortic valve: Valve area (VTI): 1.36 cm^2. Valve area (Vmax):    1.47 cm^2. Valve area (Vmean): 1.49 cm^2.  - Left atrium: The atrium was mildly dilated.  - Right ventricle: The cavity size was moderately dilated. Systolic    pressure was increased.  -  Right atrium: The atrium was mildly dilated.  - Tricuspid valve: There was moderate regurgitation.  - Pulmonary arteries: PA peak pressure: 51 mm Hg (S).  Laboratory Data:  High Sensitivity Troponin:   Recent Labs  Lab 05/18/21 1602 05/18/21 1759  TROPONINIHS 10 9     Chemistry Recent Labs   Lab 05/18/21 1602 05/19/21 0410  NA 133* 135  K 4.4 3.9  CL 95* 100  CO2 31 30  GLUCOSE 116* 85  BUN 8 8  CREATININE 0.82 0.71  CALCIUM 8.8* 8.1*  GFRNONAA >60 >60  ANIONGAP 7 5    Recent Labs  Lab 05/18/21 1602 05/19/21 0410  PROT 6.8 5.8*  ALBUMIN 2.5* 2.4*  AST 50* 39  ALT 20 15  ALKPHOS 141* 106  BILITOT 3.5* 2.6*   Hematology Recent Labs  Lab 05/18/21 1602 05/19/21 0410  WBC 5.5 4.2  RBC 4.18* 3.55*  HGB 15.2 12.9*  HCT 42.1 36.4*  MCV 100.7* 102.5*  MCH 36.4* 36.3*  MCHC 36.1* 35.4  RDW 13.8 13.9  PLT 94* 68*   BNP Recent Labs  Lab 05/18/21 1602  BNP 318.3*    DDimer No results for input(s): DDIMER in the last 168 hours.   Radiology/Studies:  DG Chest 2 View  Result Date: 05/18/2021 CLINICAL DATA:  Syncope. EXAM: CHEST - 2 VIEW COMPARISON:  April 11, 2021. FINDINGS: The heart size and mediastinal contours are within normal limits. No pneumothorax is noted. Right lung is clear. Small left pleural effusion is noted with adjacent left basilar atelectasis. The visualized skeletal structures are unremarkable. IMPRESSION: Small left pleural effusion is noted with adjacent left basilar atelectasis. Aortic Atherosclerosis (ICD10-I70.0). Electronically Signed   By: Marijo Conception M.D.   On: 05/18/2021 16:55   CT HEAD WO CONTRAST  Result Date: 05/18/2021 CLINICAL DATA:  Loss of consciousness after fall. Status post fall. Nonlocalized acute abdominal pain. EXAM: CT HEAD WITHOUT CONTRAST CT CERVICAL SPINE WITHOUT CONTRAST CT CHEST, ABDOMEN AND PELVIS WITH CONTRAST TECHNIQUE: Contiguous axial images were obtained from the base of the skull through the vertex without intravenous contrast. Multidetector CT imaging of the cervical spine was performed without intravenous contrast. Multiplanar CT image reconstructions were also generated. Multidetector CT imaging of the chest, abdomen and pelvis was performed following the standard protocol during bolus administration of  intravenous contrast. CONTRAST:  14m OMNIPAQUE IOHEXOL 350 MG/ML SOLN COMPARISON:  None. FINDINGS: CT HEAD FINDINGS BRAIN: BRAIN Cerebral ventricle sizes are concordant with the degree of cerebral volume loss. Patchy and confluent areas of decreased attenuation are noted throughout the deep and periventricular white matter of the cerebral hemispheres bilaterally, compatible with chronic microvascular ischemic disease. No evidence of large-territorial acute infarction. No parenchymal hemorrhage. No mass lesion. No extra-axial collection. No mass effect or midline shift. No hydrocephalus. Basilar cisterns are patent. Vascular: No hyperdense vessel. Atherosclerotic calcifications are present within the cavernous internal carotid arteries. Skull: No acute fracture or focal lesion. Sinuses/Orbits: Paranasal sinuses and mastoid air cells are clear. The orbits are unremarkable. Other: None. CT CERVICAL FINDINGS Alignment: Straightening of the normal cervical lordosis likely due to positioning and degenerative changes. Alignment is otherwise normal. Skull base and vertebrae: Moderate to severe multilevel degenerative changes of the spine. Associated multilevel severe osseous neural foraminal stenosis. No severe osseous central canal stenosis. No acute fracture. No aggressive appearing focal osseous lesion or focal pathologic process. Soft tissues and spinal canal: No prevertebral fluid or swelling. No visible canal hematoma. Upper chest: At least moderate  volume left pleural effusion. No pneumothorax. Other: None. CHEST: Ports and Devices: None. Lungs/airways: Nonspecific thin walled cystic change of the right apex. Couple scattered pulmonary micronodules. Passive atelectasis of the left lower lobe. Passive atelectasis of the lingula. No focal consolidation. No pulmonary mass. No pulmonary contusion or laceration. No pneumatocele formation. The central airways are patent. Pleura: Small to moderate volume left pleural  effusion no right pleural effusion. No pneumothorax. No hemothorax. Lymph Nodes: No mediastinal, hilar, or axillary lymphadenopathy. Mediastinum: No pneumomediastinum. No aortic injury or mediastinal hematoma. The thoracic aorta is normal in caliber. Severe calcified and noncalcified atherosclerotic plaque of the thoracic aorta. At least three-vessel coronary calcifications. Mild aortic valve leaflet calcifications. The heart is normal in size. No significant pericardial effusion. The main pulmonary artery is normal in caliber. No pulmonary embolus. The esophagus is unremarkable. The thyroid is unremarkable. Chest Wall / Breasts: No chest wall mass. Musculoskeletal: No acute rib or sternal fracture. No spinal fracture. ABDOMEN / PELVIS: Liver: Not enlarged. Nodular hepatic contour. No focal lesion. No laceration or subcapsular hematoma. Biliary System: Layering hyperdensity within gallbladder lumen consistent with radio-opaque gallstones. No biliary ductal dilatation. Pancreas: Normal pancreatic contour. No main pancreatic duct dilatation. Spleen: Enlarged in caliber measuring up to at least 18 cm. No focal lesion. No laceration, subcapsular hematoma, or vascular injury. Adrenal Glands: No nodularity bilaterally. Kidneys: Bilateral kidneys enhance symmetrically. No hydronephrosis. No contusion, laceration, or subcapsular hematoma. Exophytic 1.7 cm fluid density lesion within the right kidney likely represents a simple renal cyst. Similar finding on the left measuring up to 1.8 cm. No injury to the vascular structures or collecting systems. No hydroureter. The urinary bladder is unremarkable. Bowel: No small or large bowel wall thickening or dilatation. The appendix is not definitely identified. Mesentery, Omentum, and Peritoneum: Small to moderate volume simple free fluid ascites. No pneumoperitoneum. No hemoperitoneum. No mesenteric hematoma identified. No organized fluid collection. Pelvic Organs: Normal. Lymph  Nodes: No abdominal, pelvic, inguinal lymphadenopathy. Vasculature: Recanalized paraumbilical vein. Upper abdominal venous collaterals. Small esophageal varices. The main portal, splenic, superior mesenteric veins are patent. No abdominal aorta or iliac aneurysm. No active contrast extravasation or pseudoaneurysm. Musculoskeletal: No significant soft tissue hematoma. No acute pelvic fracture. No spinal fracture. IMPRESSION: 1. No acute intracranial abnormality. 2. 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 3.  No acute traumatic injury to the chest, abdomen, or pelvis. 4. No acute fracture or traumatic malalignment of the thoracic or lumbar spine. Other imaging findings of potential clinical significance: 1. No pulmonary embolus. 2. Small to moderate volume left pleural effusion. 3. Cirrhosis with portal hypertension. No focal hepatic lesion on this single-phase portal venous study. Recommend nonemergent MRI liver protocol for further evaluation. When the patient is clinically stable and able to follow directions and hold their breath (preferably as an outpatient) further evaluation with dedicated abdominal MRI should be considered. 4. Cholelithiasis no findings of acute cholecystitis. 5. Aortic Atherosclerosis (ICD10-I70.0). Including at least 3 vessel coronary artery calcifications as well as aortic valve leaflet calcifications. Correlate with aortic stenosis. Electronically Signed   By: Iven Finn M.D.   On: 05/18/2021 18:37   CT Angio Chest PE W and/or Wo Contrast  Result Date: 05/18/2021 CLINICAL DATA:  Loss of consciousness after fall. Status post fall. Nonlocalized acute abdominal pain. EXAM: CT HEAD WITHOUT CONTRAST CT CERVICAL SPINE WITHOUT CONTRAST CT CHEST, ABDOMEN AND PELVIS WITH  CONTRAST TECHNIQUE: Contiguous axial images were obtained from the base of the skull  through the vertex without intravenous contrast. Multidetector CT imaging of the cervical spine was performed without intravenous contrast. Multiplanar CT image reconstructions were also generated. Multidetector CT imaging of the chest, abdomen and pelvis was performed following the standard protocol during bolus administration of intravenous contrast. CONTRAST:  154m OMNIPAQUE IOHEXOL 350 MG/ML SOLN COMPARISON:  None. FINDINGS: CT HEAD FINDINGS BRAIN: BRAIN Cerebral ventricle sizes are concordant with the degree of cerebral volume loss. Patchy and confluent areas of decreased attenuation are noted throughout the deep and periventricular white matter of the cerebral hemispheres bilaterally, compatible with chronic microvascular ischemic disease. No evidence of large-territorial acute infarction. No parenchymal hemorrhage. No mass lesion. No extra-axial collection. No mass effect or midline shift. No hydrocephalus. Basilar cisterns are patent. Vascular: No hyperdense vessel. Atherosclerotic calcifications are present within the cavernous internal carotid arteries. Skull: No acute fracture or focal lesion. Sinuses/Orbits: Paranasal sinuses and mastoid air cells are clear. The orbits are unremarkable. Other: None. CT CERVICAL FINDINGS Alignment: Straightening of the normal cervical lordosis likely due to positioning and degenerative changes. Alignment is otherwise normal. Skull base and vertebrae: Moderate to severe multilevel degenerative changes of the spine. Associated multilevel severe osseous neural foraminal stenosis. No severe osseous central canal stenosis. No acute fracture. No aggressive appearing focal osseous lesion or focal pathologic process. Soft tissues and spinal canal: No prevertebral fluid or swelling. No visible canal hematoma. Upper chest: At least moderate volume left pleural effusion. No pneumothorax. Other: None. CHEST: Ports and Devices: None. Lungs/airways: Nonspecific thin walled cystic  change of the right apex. Couple scattered pulmonary micronodules. Passive atelectasis of the left lower lobe. Passive atelectasis of the lingula. No focal consolidation. No pulmonary mass. No pulmonary contusion or laceration. No pneumatocele formation. The central airways are patent. Pleura: Small to moderate volume left pleural effusion no right pleural effusion. No pneumothorax. No hemothorax. Lymph Nodes: No mediastinal, hilar, or axillary lymphadenopathy. Mediastinum: No pneumomediastinum. No aortic injury or mediastinal hematoma. The thoracic aorta is normal in caliber. Severe calcified and noncalcified atherosclerotic plaque of the thoracic aorta. At least three-vessel coronary calcifications. Mild aortic valve leaflet calcifications. The heart is normal in size. No significant pericardial effusion. The main pulmonary artery is normal in caliber. No pulmonary embolus. The esophagus is unremarkable. The thyroid is unremarkable. Chest Wall / Breasts: No chest wall mass. Musculoskeletal: No acute rib or sternal fracture. No spinal fracture. ABDOMEN / PELVIS: Liver: Not enlarged. Nodular hepatic contour. No focal lesion. No laceration or subcapsular hematoma. Biliary System: Layering hyperdensity within gallbladder lumen consistent with radio-opaque gallstones. No biliary ductal dilatation. Pancreas: Normal pancreatic contour. No main pancreatic duct dilatation. Spleen: Enlarged in caliber measuring up to at least 18 cm. No focal lesion. No laceration, subcapsular hematoma, or vascular injury. Adrenal Glands: No nodularity bilaterally. Kidneys: Bilateral kidneys enhance symmetrically. No hydronephrosis. No contusion, laceration, or subcapsular hematoma. Exophytic 1.7 cm fluid density lesion within the right kidney likely represents a simple renal cyst. Similar finding on the left measuring up to 1.8 cm. No injury to the vascular structures or collecting systems. No hydroureter. The urinary bladder is  unremarkable. Bowel: No small or large bowel wall thickening or dilatation. The appendix is not definitely identified. Mesentery, Omentum, and Peritoneum: Small to moderate volume simple free fluid ascites. No pneumoperitoneum. No hemoperitoneum. No mesenteric hematoma identified. No organized fluid collection. Pelvic Organs: Normal. Lymph Nodes: No abdominal, pelvic, inguinal lymphadenopathy. Vasculature: Recanalized paraumbilical  vein. Upper abdominal venous collaterals. Small esophageal varices. The main portal, splenic, superior mesenteric veins are patent. No abdominal aorta or iliac aneurysm. No active contrast extravasation or pseudoaneurysm. Musculoskeletal: No significant soft tissue hematoma. No acute pelvic fracture. No spinal fracture. IMPRESSION: 1. No acute intracranial abnormality. 2. 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 3.  No acute traumatic injury to the chest, abdomen, or pelvis. 4. No acute fracture or traumatic malalignment of the thoracic or lumbar spine. Other imaging findings of potential clinical significance: 1. No pulmonary embolus. 2. Small to moderate volume left pleural effusion. 3. Cirrhosis with portal hypertension. No focal hepatic lesion on this single-phase portal venous study. Recommend nonemergent MRI liver protocol for further evaluation. When the patient is clinically stable and able to follow directions and hold their breath (preferably as an outpatient) further evaluation with dedicated abdominal MRI should be considered. 4. Cholelithiasis no findings of acute cholecystitis. 5. Aortic Atherosclerosis (ICD10-I70.0). Including at least 3 vessel coronary artery calcifications as well as aortic valve leaflet calcifications. Correlate with aortic stenosis. Electronically Signed   By: Iven Finn M.D.   On: 05/18/2021 18:37   CT Cervical Spine  Wo Contrast  Result Date: 05/18/2021 CLINICAL DATA:  Loss of consciousness after fall. Status post fall. Nonlocalized acute abdominal pain. EXAM: CT HEAD WITHOUT CONTRAST CT CERVICAL SPINE WITHOUT CONTRAST CT CHEST, ABDOMEN AND PELVIS WITH CONTRAST TECHNIQUE: Contiguous axial images were obtained from the base of the skull through the vertex without intravenous contrast. Multidetector CT imaging of the cervical spine was performed without intravenous contrast. Multiplanar CT image reconstructions were also generated. Multidetector CT imaging of the chest, abdomen and pelvis was performed following the standard protocol during bolus administration of intravenous contrast. CONTRAST:  123m OMNIPAQUE IOHEXOL 350 MG/ML SOLN COMPARISON:  None. FINDINGS: CT HEAD FINDINGS BRAIN: BRAIN Cerebral ventricle sizes are concordant with the degree of cerebral volume loss. Patchy and confluent areas of decreased attenuation are noted throughout the deep and periventricular white matter of the cerebral hemispheres bilaterally, compatible with chronic microvascular ischemic disease. No evidence of large-territorial acute infarction. No parenchymal hemorrhage. No mass lesion. No extra-axial collection. No mass effect or midline shift. No hydrocephalus. Basilar cisterns are patent. Vascular: No hyperdense vessel. Atherosclerotic calcifications are present within the cavernous internal carotid arteries. Skull: No acute fracture or focal lesion. Sinuses/Orbits: Paranasal sinuses and mastoid air cells are clear. The orbits are unremarkable. Other: None. CT CERVICAL FINDINGS Alignment: Straightening of the normal cervical lordosis likely due to positioning and degenerative changes. Alignment is otherwise normal. Skull base and vertebrae: Moderate to severe multilevel degenerative changes of the spine. Associated multilevel severe osseous neural foraminal stenosis. No severe osseous central canal stenosis. No acute fracture. No aggressive  appearing focal osseous lesion or focal pathologic process. Soft tissues and spinal canal: No prevertebral fluid or swelling. No visible canal hematoma. Upper chest: At least moderate volume left pleural effusion. No pneumothorax. Other: None. CHEST: Ports and Devices: None. Lungs/airways: Nonspecific thin walled cystic change of the right apex. Couple scattered pulmonary micronodules. Passive atelectasis of the left lower lobe. Passive atelectasis of the lingula. No focal consolidation. No pulmonary mass. No pulmonary contusion or laceration. No pneumatocele formation. The central airways are patent. Pleura: Small to moderate volume left pleural effusion no right pleural effusion. No pneumothorax. No hemothorax. Lymph Nodes: No mediastinal, hilar, or axillary lymphadenopathy. Mediastinum: No pneumomediastinum. No  aortic injury or mediastinal hematoma. The thoracic aorta is normal in caliber. Severe calcified and noncalcified atherosclerotic plaque of the thoracic aorta. At least three-vessel coronary calcifications. Mild aortic valve leaflet calcifications. The heart is normal in size. No significant pericardial effusion. The main pulmonary artery is normal in caliber. No pulmonary embolus. The esophagus is unremarkable. The thyroid is unremarkable. Chest Wall / Breasts: No chest wall mass. Musculoskeletal: No acute rib or sternal fracture. No spinal fracture. ABDOMEN / PELVIS: Liver: Not enlarged. Nodular hepatic contour. No focal lesion. No laceration or subcapsular hematoma. Biliary System: Layering hyperdensity within gallbladder lumen consistent with radio-opaque gallstones. No biliary ductal dilatation. Pancreas: Normal pancreatic contour. No main pancreatic duct dilatation. Spleen: Enlarged in caliber measuring up to at least 18 cm. No focal lesion. No laceration, subcapsular hematoma, or vascular injury. Adrenal Glands: No nodularity bilaterally. Kidneys: Bilateral kidneys enhance symmetrically. No  hydronephrosis. No contusion, laceration, or subcapsular hematoma. Exophytic 1.7 cm fluid density lesion within the right kidney likely represents a simple renal cyst. Similar finding on the left measuring up to 1.8 cm. No injury to the vascular structures or collecting systems. No hydroureter. The urinary bladder is unremarkable. Bowel: No small or large bowel wall thickening or dilatation. The appendix is not definitely identified. Mesentery, Omentum, and Peritoneum: Small to moderate volume simple free fluid ascites. No pneumoperitoneum. No hemoperitoneum. No mesenteric hematoma identified. No organized fluid collection. Pelvic Organs: Normal. Lymph Nodes: No abdominal, pelvic, inguinal lymphadenopathy. Vasculature: Recanalized paraumbilical vein. Upper abdominal venous collaterals. Small esophageal varices. The main portal, splenic, superior mesenteric veins are patent. No abdominal aorta or iliac aneurysm. No active contrast extravasation or pseudoaneurysm. Musculoskeletal: No significant soft tissue hematoma. No acute pelvic fracture. No spinal fracture. IMPRESSION: 1. No acute intracranial abnormality. 2. 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 3.  No acute traumatic injury to the chest, abdomen, or pelvis. 4. No acute fracture or traumatic malalignment of the thoracic or lumbar spine. Other imaging findings of potential clinical significance: 1. No pulmonary embolus. 2. Small to moderate volume left pleural effusion. 3. Cirrhosis with portal hypertension. No focal hepatic lesion on this single-phase portal venous study. Recommend nonemergent MRI liver protocol for further evaluation. When the patient is clinically stable and able to follow directions and hold their breath (preferably as an outpatient) further evaluation with dedicated abdominal MRI should be considered. 4.  Cholelithiasis no findings of acute cholecystitis. 5. Aortic Atherosclerosis (ICD10-I70.0). Including at least 3 vessel coronary artery calcifications as well as aortic valve leaflet calcifications. Correlate with aortic stenosis. Electronically Signed   By: Iven Finn M.D.   On: 05/18/2021 18:37   CT ABDOMEN PELVIS W CONTRAST  Result Date: 05/18/2021 CLINICAL DATA:  Loss of consciousness after fall. Status post fall. Nonlocalized acute abdominal pain. EXAM: CT HEAD WITHOUT CONTRAST CT CERVICAL SPINE WITHOUT CONTRAST CT CHEST, ABDOMEN AND PELVIS WITH CONTRAST TECHNIQUE: Contiguous axial images were obtained from the base of the skull through the vertex without intravenous contrast. Multidetector CT imaging of the cervical spine was performed without intravenous contrast. Multiplanar CT image reconstructions were also generated. Multidetector CT imaging of the chest, abdomen and pelvis was performed following the standard protocol during bolus administration of intravenous contrast. CONTRAST:  166m OMNIPAQUE IOHEXOL 350 MG/ML SOLN COMPARISON:  None. FINDINGS: CT HEAD FINDINGS BRAIN: BRAIN Cerebral ventricle sizes are concordant with the degree of cerebral volume loss. Patchy  and confluent areas of decreased attenuation are noted throughout the deep and periventricular white matter of the cerebral hemispheres bilaterally, compatible with chronic microvascular ischemic disease. No evidence of large-territorial acute infarction. No parenchymal hemorrhage. No mass lesion. No extra-axial collection. No mass effect or midline shift. No hydrocephalus. Basilar cisterns are patent. Vascular: No hyperdense vessel. Atherosclerotic calcifications are present within the cavernous internal carotid arteries. Skull: No acute fracture or focal lesion. Sinuses/Orbits: Paranasal sinuses and mastoid air cells are clear. The orbits are unremarkable. Other: None. CT CERVICAL FINDINGS Alignment: Straightening of the normal  cervical lordosis likely due to positioning and degenerative changes. Alignment is otherwise normal. Skull base and vertebrae: Moderate to severe multilevel degenerative changes of the spine. Associated multilevel severe osseous neural foraminal stenosis. No severe osseous central canal stenosis. No acute fracture. No aggressive appearing focal osseous lesion or focal pathologic process. Soft tissues and spinal canal: No prevertebral fluid or swelling. No visible canal hematoma. Upper chest: At least moderate volume left pleural effusion. No pneumothorax. Other: None. CHEST: Ports and Devices: None. Lungs/airways: Nonspecific thin walled cystic change of the right apex. Couple scattered pulmonary micronodules. Passive atelectasis of the left lower lobe. Passive atelectasis of the lingula. No focal consolidation. No pulmonary mass. No pulmonary contusion or laceration. No pneumatocele formation. The central airways are patent. Pleura: Small to moderate volume left pleural effusion no right pleural effusion. No pneumothorax. No hemothorax. Lymph Nodes: No mediastinal, hilar, or axillary lymphadenopathy. Mediastinum: No pneumomediastinum. No aortic injury or mediastinal hematoma. The thoracic aorta is normal in caliber. Severe calcified and noncalcified atherosclerotic plaque of the thoracic aorta. At least three-vessel coronary calcifications. Mild aortic valve leaflet calcifications. The heart is normal in size. No significant pericardial effusion. The main pulmonary artery is normal in caliber. No pulmonary embolus. The esophagus is unremarkable. The thyroid is unremarkable. Chest Wall / Breasts: No chest wall mass. Musculoskeletal: No acute rib or sternal fracture. No spinal fracture. ABDOMEN / PELVIS: Liver: Not enlarged. Nodular hepatic contour. No focal lesion. No laceration or subcapsular hematoma. Biliary System: Layering hyperdensity within gallbladder lumen consistent with radio-opaque gallstones. No biliary  ductal dilatation. Pancreas: Normal pancreatic contour. No main pancreatic duct dilatation. Spleen: Enlarged in caliber measuring up to at least 18 cm. No focal lesion. No laceration, subcapsular hematoma, or vascular injury. Adrenal Glands: No nodularity bilaterally. Kidneys: Bilateral kidneys enhance symmetrically. No hydronephrosis. No contusion, laceration, or subcapsular hematoma. Exophytic 1.7 cm fluid density lesion within the right kidney likely represents a simple renal cyst. Similar finding on the left measuring up to 1.8 cm. No injury to the vascular structures or collecting systems. No hydroureter. The urinary bladder is unremarkable. Bowel: No small or large bowel wall thickening or dilatation. The appendix is not definitely identified. Mesentery, Omentum, and Peritoneum: Small to moderate volume simple free fluid ascites. No pneumoperitoneum. No hemoperitoneum. No mesenteric hematoma identified. No organized fluid collection. Pelvic Organs: Normal. Lymph Nodes: No abdominal, pelvic, inguinal lymphadenopathy. Vasculature: Recanalized paraumbilical vein. Upper abdominal venous collaterals. Small esophageal varices. The main portal, splenic, superior mesenteric veins are patent. No abdominal aorta or iliac aneurysm. No active contrast extravasation or pseudoaneurysm. Musculoskeletal: No significant soft tissue hematoma. No acute pelvic fracture. No spinal fracture. IMPRESSION: 1. No acute intracranial abnormality. 2. 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 3.  No acute traumatic injury to the chest, abdomen, or pelvis. 4. No acute fracture  or traumatic malalignment of the thoracic or lumbar spine. Other imaging findings of potential clinical significance: 1. No pulmonary embolus. 2. Small to moderate volume left pleural effusion. 3. Cirrhosis with portal hypertension. No  focal hepatic lesion on this single-phase portal venous study. Recommend nonemergent MRI liver protocol for further evaluation. When the patient is clinically stable and able to follow directions and hold their breath (preferably as an outpatient) further evaluation with dedicated abdominal MRI should be considered. 4. Cholelithiasis no findings of acute cholecystitis. 5. Aortic Atherosclerosis (ICD10-I70.0). Including at least 3 vessel coronary artery calcifications as well as aortic valve leaflet calcifications. Correlate with aortic stenosis. Electronically Signed   By: Iven Finn M.D.   On: 05/18/2021 18:37   US Carotid Bilateral  Result Date: 05/18/2021 CLINICAL DATA:  Syncope and collapse. EXAM: BILATERAL CAROTID DUPLEX ULTRASOUND TECHNIQUE: Pearline Cables scale imaging, color Doppler and duplex ultrasound were performed of bilateral carotid and vertebral arteries in the neck. COMPARISON:  None. FINDINGS: Criteria: Quantification of carotid stenosis is based on velocity parameters that correlate the residual internal carotid diameter with NASCET-based stenosis levels, using the diameter of the distal internal carotid lumen as the denominator for stenosis measurement. The following velocity measurements were obtained: RIGHT ICA: 59 cm/sec CCA: 87 cm/sec SYSTOLIC ICA/CCA RATIO:  0.7 ECA: 67 cm/sec LEFT ICA: 76 cm/sec CCA: 69 cm/sec SYSTOLIC ICA/CCA RATIO:  1.1 ECA: 70 cm/sec RIGHT CAROTID ARTERY: Minimal atherosclerotic plaque in the right carotid bulb extending into the proximal ICA. There is atherosclerotic plaque in the external carotid artery. RIGHT VERTEBRAL ARTERY:  Patent with anteverted flow. LEFT CAROTID ARTERY: Mild plaque in the carotid bulb extending into the proximal ICA. LEFT VERTEBRAL ARTERY: There appears to be somewhat retrograde flow in the left vertebral artery. The left subclavian artery appears patent with normal direction. Focal stenosis at the origin of the left subclavian artery is not  excluded. CT angiography may provide better evaluation. Upper extremity blood pressures: RIGHT: 110/71 LEFT: 105/78 IMPRESSION: 1. Less than 50% ICA stenosis bilaterally. 2. Retrograde appearance of flow in the left vertebral artery suspicious for subclavian steal syndrome. CT angiography is recommended to evaluate for possible proximal subclavian artery stenosis. Electronically Signed   By: Anner Crete M.D.   On: 05/18/2021 22:26   [place] ASCITES (ABDOMEN LIMITED)  Result Date: 05/19/2021 CLINICAL DATA:  Ascites EXAM: LIMITED ABDOMEN ULTRASOUND FOR ASCITES TECHNIQUE: Limited ultrasound survey for ascites was performed in all four abdominal quadrants. COMPARISON:  CT 05/18/2021 FINDINGS: There is moderate volume abdominal ascites seen in the left upper and lower quadrants. No ascites seen in the right upper or right lower quadrants. IMPRESSION: Moderate volume ascites seen in the left upper and lower quadrants. Electronically Signed   By: Maurine Simmering M.D.   On: 05/19/2021 14:37     Assessment and Plan:   Atrial fibrillation with RVR -- Usually asymptomatic in atrial fibrillation, though recent history of LOC x4 as detailed above.  2019 admission with similar presentation with etiology of syncope unclear but suspected as possibly 2/2 RVR.  He does report continued alcohol use.  He does not regularly follow with a cardiologist but reports compliance with anticoagulation, filled by PCP.  He did hit his head with subsequent head CT without acute findings.  Obtain echo.  Check TSH.  Maintain electrolytes at goal.  Continue current rate control.  Recommend reassess risks versus benefits of ongoing anticoagulation.  Reports recent melena and fall with hitting head x2.  Recent CBC shows thrombocytopenia, though  has history of previously documented thrombocytopenia.  At 2019 prior admission, it was recommended he hold PTA Eliquis given risks of bleeding.  Current CHA2DS2VASc score of at least 5 (HFpEF,  hypertension, age, DM2, vascular).  Recommend reassess benefits of ongoing anticoagulation at this time -Will need to consider history of falls, melena, and ongoing alcohol use.   Syncope x4 --Previously admitted with syncope, thought possibly due to RVR.  Presented hypotensive with RVR and reported poor oral intake, recent diarrhea, and ongoing EtOH use (though EtOH level <10).  CTA negative for PE.   --Suspect syncope 2/2 orthostatic hypotension at this time given likely dehydration and atrial fibrillation with RVR.  At presentation, his electrolytes were at goal and glucose 116.  Not consistent with ACS as HS Tn not concerning at 10  9.  EKG without acute changes.  Carotids were without significant stenosis but did show possible subclavian steal syndrome with recommendation for follow-up imaging with CTA as above.   --Recommend obtain this follow-up imaging to r/o subclavian steal syndrome.  --Daily CBC /BMET.  --Obtain echo to reassess EF and valvular function/rule out acute structural changes, especially given CT findings as below.   --Continue to monitor on telemetry.  If no significant arrhythmia on telemetry, consider discharge with live Zio monitoring.   --No driving for at least 6 months, given recent syncope.  CAC by CT --No reported chest pain.  CT shows 3V CAD.  EKG without acute changes.  High-sensitivity troponin 10, 9.  Not consistent with ACS.  He does have risk factors for CAD.  Obtain echo as above.  Recommend outpatient follow-up -consider outpatient stress test at that time.  Aggressive risk factor modification recommended.  Further recommendations if indicated pending echo.  At this time, no plan for invasive ischemic work-up this admission.  Aortic valve calcification by CT --Aortic valve calcification seen on CT with murmur on exam.  Obtain echo as above.  Could contribute to syncope.  Heart rate and blood pressure control recommended.  Ongoing alcohol use --EtOH <10.   Reports 2 beers per day.  Complete cessation recommended.  For questions or updates, please contact [place] Please consult [email or web address] for contact info under    Signed, Arvil Chaco, PA-C  05/19/2021 3:48 PM

## 2021-05-20 DIAGNOSIS — I4892 Unspecified atrial flutter: Secondary | ICD-10-CM | POA: Diagnosis not present

## 2021-05-20 DIAGNOSIS — K7460 Unspecified cirrhosis of liver: Secondary | ICD-10-CM | POA: Diagnosis not present

## 2021-05-20 DIAGNOSIS — K7031 Alcoholic cirrhosis of liver with ascites: Secondary | ICD-10-CM

## 2021-05-20 DIAGNOSIS — K7290 Hepatic failure, unspecified without coma: Secondary | ICD-10-CM

## 2021-05-20 LAB — FOLATE: Folate: 25 ng/mL (ref >5.9)

## 2021-05-20 LAB — IRON AND TIBC
Iron: 138 ug/dL (ref 45–182)
Saturation Ratios: 69 % — ABNORMAL HIGH (ref 17.9–39.5)
TIBC: 199 ug/dL — ABNORMAL LOW (ref 250–450)
UIBC: 61 ug/dL

## 2021-05-20 LAB — GLUCOSE, CAPILLARY
Glucose-Capillary: 105 mg/dL — ABNORMAL HIGH (ref 70–99)
Glucose-Capillary: 106 mg/dL — ABNORMAL HIGH (ref 70–99)
Glucose-Capillary: 136 mg/dL — ABNORMAL HIGH (ref 70–99)
Glucose-Capillary: 137 mg/dL — ABNORMAL HIGH (ref 70–99)
Glucose-Capillary: 154 mg/dL — ABNORMAL HIGH (ref 70–99)

## 2021-05-20 LAB — LACTIC ACID, PLASMA: Lactic Acid, Venous: 2.1 mmol/L (ref 0.5–1.9)

## 2021-05-20 LAB — FERRITIN: Ferritin: 204 ng/mL (ref 24–336)

## 2021-05-20 MED ORDER — METOPROLOL TARTRATE 25 MG PO TABS
25.0000 mg | ORAL_TABLET | Freq: Two times a day (BID) | ORAL | Status: DC
  Administered 2021-05-20 – 2021-05-23 (×6): 25 mg via ORAL
  Filled 2021-05-20 (×6): qty 1

## 2021-05-20 MED ORDER — LORAZEPAM 1 MG PO TABS: 1.0000 mg | ORAL_TABLET | ORAL | Status: DC | PRN

## 2021-05-20 MED ORDER — LORAZEPAM 2 MG/ML IJ SOLN: 1.0000 mg | INTRAMUSCULAR | Status: DC | PRN

## 2021-05-20 MED ORDER — MIDODRINE HCL 5 MG PO TABS
10.0000 mg | ORAL_TABLET | Freq: Three times a day (TID) | ORAL | Status: DC
  Administered 2021-05-20 – 2021-05-23 (×9): 10 mg via ORAL
  Filled 2021-05-20 (×8): qty 2

## 2021-05-20 MED ORDER — MAGNESIUM SULFATE 2 GM/50ML IV SOLN
2.0000 g | Freq: Once | INTRAVENOUS | Status: AC
  Administered 2021-05-20: 2 g via INTRAVENOUS
  Filled 2021-05-20: qty 50

## 2021-05-20 MED ORDER — INSULIN ASPART 100 UNIT/ML IJ SOLN
0.0000 [IU] | Freq: Three times a day (TID) | INTRAMUSCULAR | Status: DC
  Administered 2021-05-23: 1 [IU] via SUBCUTANEOUS
  Filled 2021-05-20 (×2): qty 1

## 2021-05-20 MED ORDER — LORAZEPAM 2 MG/ML IJ SOLN
0.0000 mg | INTRAMUSCULAR | Status: AC
  Administered 2021-05-21: 3 mg via INTRAVENOUS
  Filled 2021-05-20: qty 2

## 2021-05-20 MED ORDER — FUROSEMIDE 10 MG/ML IJ SOLN
20.0000 mg | Freq: Two times a day (BID) | INTRAMUSCULAR | Status: DC
  Administered 2021-05-20 – 2021-05-23 (×6): 20 mg via INTRAVENOUS
  Filled 2021-05-20 (×6): qty 2

## 2021-05-20 MED ORDER — QUETIAPINE FUMARATE 25 MG PO TABS
200.0000 mg | ORAL_TABLET | Freq: Every day | ORAL | Status: AC
  Administered 2021-05-20: 200 mg via ORAL
  Filled 2021-05-20: qty 8

## 2021-05-20 MED ORDER — LORAZEPAM 2 MG/ML IJ SOLN: 0.0000 mg | Freq: Three times a day (TID) | INTRAMUSCULAR | Status: DC

## 2021-05-20 NOTE — Progress Notes (Addendum)
Patient was found out of his room wandering in hallway, stating he was in [place].  Pt assisted back to his room safely.    Assessed pt, he continues to report he is in [place]; and when I re-orient him, he then states he knows he is in the hospital and that he is from [place].  He endorses visual hallucinations, as he states he saw "Trey Paula" walking in the hall as he was sitting on the side of the bed.  He reports mild nausea, denies headache, and he is experiencing a mild tremor.    Secure message sent to Dr Antionette Char @ [phone]  Additional orders added for ativan. See MAR; will continue to monitor

## 2021-05-20 NOTE — Progress Notes (Signed)
Lawrence Repress, MD [address]  [address]  [place], [place] [address]  Main: [phone]  Fax: [phone] Pager: [phone]   Subjective: No acute events overnight, patient denies any complaints today.  Oxygenating 90% on 2 L  Objective: Vital signs in last 24 hours: Vitals:   05/19/21 2100 05/20/21 0000 05/20/21 0500 05/20/21 0734  BP:  106/65 117/67 112/74  Pulse:   (!) 117 (!) 118  Resp:  20 20 18   Temp:  97.6 F (36.4 C) 98.2 F (36.8 C) 97.9 F (36.6 C)  TempSrc:  Oral Oral   SpO2:  95% 97% 98%  Weight: 118.1 kg     Height: 5\' 8"  (1.727 m)      Weight change: -6.169 kg  Intake/Output Summary (Last 24 hours) at 05/20/2021 1035 Last data filed at 05/20/2021 0700 Gross per 24 hour  Intake 240 ml  Output --  Net 240 ml     Exam: Heart:: Tachycardia Lungs: clear to auscultation Abdomen: Soft, diffusely distended, dull to percussion, nontender No pedal edema  Lab Results: CBC Latest Ref Rng & Units 05/19/2021 05/18/2021 04/11/2021  WBC 4.0 - 10.5 K/uL 4.2 5.5 5.0  Hemoglobin 13.0 - 17.0 g/dL 12.9(L) 15.2 13.8  Hematocrit 39.0 - 52.0 % 36.4(L) 42.1 40.4  Platelets 150 - 400 K/uL 68(L) 94(L) 76(L)   CMP Latest Ref Rng & Units 05/19/2021 05/18/2021 04/11/2021  Glucose 70 - 99 mg/dL 85 07/18/2021) 04/13/2021)  BUN 8 - 23 mg/dL 8 8 10   Creatinine 0.61 - 1.24 mg/dL 696(V 893(Y )  Sodium 135 - 145 mmol/L 135 133(L) 133(L)  Potassium 3.5 - 5.1 mmol/L 3.9 4.4 4.8  Chloride 98 - 111 mmol/L 100 95(L) 93(L)  CO2 22 - 32 mmol/L 30 31 31   Calcium 8.9 - 10.3 mg/dL 8.1(L) 8.8(L) 8.4(L)  Total Protein 6.5 - 8.1 g/dL 1.01) 6.8 7.51)  Total Bilirubin 0.3 - 1.2 mg/dL 2.6(H) 3.5(H) 3.1(H)  Alkaline Phos 38 - 126 U/L 106 141(H) 116  AST 15 - 41 U/L 39 50(H) 38  ALT 0 - 44 U/L 15 20 17     Micro Results: Recent Results (from the past 240 hour(s))  Blood culture (routine single)     Status: None (Preliminary result)   Collection Time: 05/18/21  4:02 PM   Specimen: BLOOD   Result Value Ref Range Status   Specimen Description BLOOD BLOOD RIGHT FOREARM  Final   Special Requests   Final    BOTTLES DRAWN AEROBIC AND ANAEROBIC Blood Culture adequate volume   Culture   Final    NO GROWTH 2 DAYS Performed at [facility], [address] [place], 07/18/21    Report Status PENDING  Incomplete  Resp Panel by RT-PCR (Flu A&B, Covid) Nasopharyngeal Swab     Status: None   Collection Time: 05/18/21  4:09 PM   Specimen: Nasopharyngeal Swab; Nasopharyngeal(NP) swabs in vial transport medium  Result Value Ref Range Status   SARS Coronavirus 2 by RT PCR NEGATIVE NEGATIVE Final    Comment: (NOTE) SARS-CoV-2 target nucleic acids are NOT DETECTED.  The SARS-CoV-2 RNA is generally detectable in upper respiratory specimens during the acute phase of infection. The lowest concentration of SARS-CoV-2 viral copies this assay can detect is 138 copies/mL. A negative result does not preclude SARS-Cov-2 infection and should not be used as the sole basis for treatment or other patient management decisions. A negative result may occur with  improper specimen collection/handling, submission of specimen  other than nasopharyngeal swab, presence of viral mutation(s) within the areas targeted by this assay, and inadequate number of viral copies(<138 copies/mL). A negative result must be combined with clinical observations, patient history, and epidemiological information. The expected result is Negative.  Fact Sheet for Patients:  BloggerCourse.com  Fact Sheet for Healthcare Providers:  SeriousBroker.it  This test is no t yet approved or cleared by the [place] FDA and  has been authorized for detection and/or diagnosis of SARS-CoV-2 by FDA under an Emergency Use Authorization (EUA). This EUA will remain  in effect (meaning this test can be used) for the duration of the COVID-19 declaration under Section  564(b)(1) of the Act, 21 U.S.C.section 360bbb-3(b)(1), unless the authorization is terminated  or revoked sooner.       Influenza A by PCR NEGATIVE NEGATIVE Final   Influenza B by PCR NEGATIVE NEGATIVE Final    Comment: (NOTE) The Xpert Xpress SARS-CoV-2/FLU/RSV plus assay is intended as an aid in the diagnosis of influenza from Nasopharyngeal swab specimens and should not be used as a sole basis for treatment. Nasal washings and aspirates are unacceptable for Xpert Xpress SARS-CoV-2/FLU/RSV testing.  Fact Sheet for Patients: BloggerCourse.com  Fact Sheet for Healthcare Providers: SeriousBroker.it  This test is not yet approved or cleared by the [place] FDA and has been authorized for detection and/or diagnosis of SARS-CoV-2 by FDA under an Emergency Use Authorization (EUA). This EUA will remain in effect (meaning this test can be used) for the duration of the COVID-19 declaration under Section 564(b)(1) of the Act, 21 U.S.C. section 360bbb-3(b)(1), unless the authorization is terminated or revoked.  Performed at [facility], [address], [place], [place] [address]    Studies/Results: [place] Chest 2 View  Result Date: 05/18/2021 CLINICAL DATA:  Syncope. EXAM: CHEST - 2 VIEW COMPARISON:  April 11, 2021. FINDINGS: The heart size and mediastinal contours are within normal limits. No pneumothorax is noted. Right lung is clear. Small left pleural effusion is noted with adjacent left basilar atelectasis. The visualized skeletal structures are unremarkable. IMPRESSION: Small left pleural effusion is noted with adjacent left basilar atelectasis. Aortic Atherosclerosis (ICD10-I70.0). Electronically Signed   By: Lupita Raider M.D.   On: 05/18/2021 16:55   CT HEAD WO CONTRAST  Result Date: 05/18/2021 CLINICAL DATA:  Loss of consciousness after fall. Status post fall. Nonlocalized acute abdominal pain. EXAM: CT HEAD WITHOUT  CONTRAST CT CERVICAL SPINE WITHOUT CONTRAST CT CHEST, ABDOMEN AND PELVIS WITH CONTRAST TECHNIQUE: Contiguous axial images were obtained from the base of the skull through the vertex without intravenous contrast. Multidetector CT imaging of the cervical spine was performed without intravenous contrast. Multiplanar CT image reconstructions were also generated. Multidetector CT imaging of the chest, abdomen and pelvis was performed following the standard protocol during bolus administration of intravenous contrast. CONTRAST:  OMNIPAQUE IOHEXOL 350 MG/ML SOLN COMPARISON:  None. FINDINGS: CT HEAD FINDINGS BRAIN: BRAIN Cerebral ventricle sizes are concordant with the degree of cerebral volume loss. Patchy and confluent areas of decreased attenuation are noted throughout the deep and periventricular white matter of the cerebral hemispheres bilaterally, compatible with chronic microvascular ischemic disease. No evidence of large-territorial acute infarction. No parenchymal hemorrhage. No mass lesion. No extra-axial collection. No mass effect or midline shift. No hydrocephalus. Basilar cisterns are patent. Vascular: No hyperdense vessel. Atherosclerotic calcifications are present within the cavernous internal carotid arteries. Skull: No acute fracture or focal lesion. Sinuses/Orbits: Paranasal sinuses and mastoid air cells are clear. The orbits  are unremarkable. Other: None. CT CERVICAL FINDINGS Alignment: Straightening of the normal cervical lordosis likely due to positioning and degenerative changes. Alignment is otherwise normal. Skull base and vertebrae: Moderate to severe multilevel degenerative changes of the spine. Associated multilevel severe osseous neural foraminal stenosis. No severe osseous central canal stenosis. No acute fracture. No aggressive appearing focal osseous lesion or focal pathologic process. Soft tissues and spinal canal: No prevertebral fluid or swelling. No visible canal hematoma. Upper  chest: At least moderate volume left pleural effusion. No pneumothorax. Other: None. CHEST: Ports and Devices: None. Lungs/airways: Nonspecific thin walled cystic change of the right apex. Couple scattered pulmonary micronodules. Passive atelectasis of the left lower lobe. Passive atelectasis of the lingula. No focal consolidation. No pulmonary mass. No pulmonary contusion or laceration. No pneumatocele formation. The central airways are patent. Pleura: Small to moderate volume left pleural effusion no right pleural effusion. No pneumothorax. No hemothorax. Lymph Nodes: No mediastinal, hilar, or axillary lymphadenopathy. Mediastinum: No pneumomediastinum. No aortic injury or mediastinal hematoma. The thoracic aorta is normal in caliber. Severe calcified and noncalcified atherosclerotic plaque of the thoracic aorta. At least three-vessel coronary calcifications. Mild aortic valve leaflet calcifications. The heart is normal in size. No significant pericardial effusion. The main pulmonary artery is normal in caliber. No pulmonary embolus. The esophagus is unremarkable. The thyroid is unremarkable. Chest Wall / Breasts: No chest wall mass. Musculoskeletal: No acute rib or sternal fracture. No spinal fracture. ABDOMEN / PELVIS: Liver: Not enlarged. Nodular hepatic contour. No focal lesion. No laceration or subcapsular hematoma. Biliary System: Layering hyperdensity within gallbladder lumen consistent with radio-opaque gallstones. No biliary ductal dilatation. Pancreas: Normal pancreatic contour. No main pancreatic duct dilatation. Spleen: Enlarged in caliber measuring up to at least 18 cm. No focal lesion. No laceration, subcapsular hematoma, or vascular injury. Adrenal Glands: No nodularity bilaterally. Kidneys: Bilateral kidneys enhance symmetrically. No hydronephrosis. No contusion, laceration, or subcapsular hematoma. Exophytic 1.7 cm fluid density lesion within the right kidney likely represents a simple renal cyst.  Similar finding on the left measuring up to 1.8 cm. No injury to the vascular structures or collecting systems. No hydroureter. The urinary bladder is unremarkable. Bowel: No small or large bowel wall thickening or dilatation. The appendix is not definitely identified. Mesentery, Omentum, and Peritoneum: Small to moderate volume simple free fluid ascites. No pneumoperitoneum. No hemoperitoneum. No mesenteric hematoma identified. No organized fluid collection. Pelvic Organs: Normal. Lymph Nodes: No abdominal, pelvic, inguinal lymphadenopathy. Vasculature: Recanalized paraumbilical vein. Upper abdominal venous collaterals. Small esophageal varices. The main portal, splenic, superior mesenteric veins are patent. No abdominal aorta or iliac aneurysm. No active contrast extravasation or pseudoaneurysm. Musculoskeletal: No significant soft tissue hematoma. No acute pelvic fracture. No spinal fracture. IMPRESSION: 1. No acute intracranial abnormality. 2. 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 3.  No acute traumatic injury to the chest, abdomen, or pelvis. 4. No acute fracture or traumatic malalignment of the thoracic or lumbar spine. Other imaging findings of potential clinical significance: 1. No pulmonary embolus. 2. Small to moderate volume left pleural effusion. 3. Cirrhosis with portal hypertension. No focal hepatic lesion on this single-phase portal venous study. Recommend nonemergent MRI liver protocol for further evaluation. When the patient is clinically stable and able to follow directions and hold their breath (preferably as an outpatient) further evaluation with dedicated abdominal MRI should be considered. 4. Cholelithiasis no findings of acute  cholecystitis. 5. Aortic Atherosclerosis (ICD10-I70.0). Including at least 3 vessel coronary artery calcifications as well as aortic  valve leaflet calcifications. Correlate with aortic stenosis. Electronically Signed   By: Tish FredericksonMorgane  Naveau M.D.   On: 05/18/2021 18:37   CT Angio Chest PE W and/or Wo Contrast  Result Date: 05/18/2021 CLINICAL DATA:  Loss of consciousness after fall. Status post fall. Nonlocalized acute abdominal pain. EXAM: CT HEAD WITHOUT CONTRAST CT CERVICAL SPINE WITHOUT CONTRAST CT CHEST, ABDOMEN AND PELVIS WITH CONTRAST TECHNIQUE: Contiguous axial images were obtained from the base of the skull through the vertex without intravenous contrast. Multidetector CT imaging of the cervical spine was performed without intravenous contrast. Multiplanar CT image reconstructions were also generated. Multidetector CT imaging of the chest, abdomen and pelvis was performed following the standard protocol during bolus administration of intravenous contrast. CONTRAST:  100mL OMNIPAQUE IOHEXOL 350 MG/ML SOLN COMPARISON:  None. FINDINGS: CT HEAD FINDINGS BRAIN: BRAIN Cerebral ventricle sizes are concordant with the degree of cerebral volume loss. Patchy and confluent areas of decreased attenuation are noted throughout the deep and periventricular white matter of the cerebral hemispheres bilaterally, compatible with chronic microvascular ischemic disease. No evidence of large-territorial acute infarction. No parenchymal hemorrhage. No mass lesion. No extra-axial collection. No mass effect or midline shift. No hydrocephalus. Basilar cisterns are patent. Vascular: No hyperdense vessel. Atherosclerotic calcifications are present within the cavernous internal carotid arteries. Skull: No acute fracture or focal lesion. Sinuses/Orbits: Paranasal sinuses and mastoid air cells are clear. The orbits are unremarkable. Other: None. CT CERVICAL FINDINGS Alignment: Straightening of the normal cervical lordosis likely due to positioning and degenerative changes. Alignment is otherwise normal. Skull base and vertebrae: Moderate to severe multilevel  degenerative changes of the spine. Associated multilevel severe osseous neural foraminal stenosis. No severe osseous central canal stenosis. No acute fracture. No aggressive appearing focal osseous lesion or focal pathologic process. Soft tissues and spinal canal: No prevertebral fluid or swelling. No visible canal hematoma. Upper chest: At least moderate volume left pleural effusion. No pneumothorax. Other: None. CHEST: Ports and Devices: None. Lungs/airways: Nonspecific thin walled cystic change of the right apex. Couple scattered pulmonary micronodules. Passive atelectasis of the left lower lobe. Passive atelectasis of the lingula. No focal consolidation. No pulmonary mass. No pulmonary contusion or laceration. No pneumatocele formation. The central airways are patent. Pleura: Small to moderate volume left pleural effusion no right pleural effusion. No pneumothorax. No hemothorax. Lymph Nodes: No mediastinal, hilar, or axillary lymphadenopathy. Mediastinum: No pneumomediastinum. No aortic injury or mediastinal hematoma. The thoracic aorta is normal in caliber. Severe calcified and noncalcified atherosclerotic plaque of the thoracic aorta. At least three-vessel coronary calcifications. Mild aortic valve leaflet calcifications. The heart is normal in size. No significant pericardial effusion. The main pulmonary artery is normal in caliber. No pulmonary embolus. The esophagus is unremarkable. The thyroid is unremarkable. Chest Wall / Breasts: No chest wall mass. Musculoskeletal: No acute rib or sternal fracture. No spinal fracture. ABDOMEN / PELVIS: Liver: Not enlarged. Nodular hepatic contour. No focal lesion. No laceration or subcapsular hematoma. Biliary System: Layering hyperdensity within gallbladder lumen consistent with radio-opaque gallstones. No biliary ductal dilatation. Pancreas: Normal pancreatic contour. No main pancreatic duct dilatation. Spleen: Enlarged in caliber measuring up to at least 18 cm. No  focal lesion. No laceration, subcapsular hematoma, or vascular injury. Adrenal Glands: No nodularity bilaterally. Kidneys: Bilateral kidneys enhance symmetrically. No hydronephrosis. No contusion, laceration, or subcapsular hematoma. Exophytic 1.7 cm fluid density lesion within the right kidney likely represents  a simple renal cyst. Similar finding on the left measuring up to 1.8 cm. No injury to the vascular structures or collecting systems. No hydroureter. The urinary bladder is unremarkable. Bowel: No small or large bowel wall thickening or dilatation. The appendix is not definitely identified. Mesentery, Omentum, and Peritoneum: Small to moderate volume simple free fluid ascites. No pneumoperitoneum. No hemoperitoneum. No mesenteric hematoma identified. No organized fluid collection. Pelvic Organs: Normal. Lymph Nodes: No abdominal, pelvic, inguinal lymphadenopathy. Vasculature: Recanalized paraumbilical vein. Upper abdominal venous collaterals. Small esophageal varices. The main portal, splenic, superior mesenteric veins are patent. No abdominal aorta or iliac aneurysm. No active contrast extravasation or pseudoaneurysm. Musculoskeletal: No significant soft tissue hematoma. No acute pelvic fracture. No spinal fracture. IMPRESSION: 1. No acute intracranial abnormality. 2. 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 3.  No acute traumatic injury to the chest, abdomen, or pelvis. 4. No acute fracture or traumatic malalignment of the thoracic or lumbar spine. Other imaging findings of potential clinical significance: 1. No pulmonary embolus. 2. Small to moderate volume left pleural effusion. 3. Cirrhosis with portal hypertension. No focal hepatic lesion on this single-phase portal venous study. Recommend nonemergent MRI liver protocol for further evaluation. When the patient is clinically  stable and able to follow directions and hold their breath (preferably as an outpatient) further evaluation with dedicated abdominal MRI should be considered. 4. Cholelithiasis no findings of acute cholecystitis. 5. Aortic Atherosclerosis (ICD10-I70.0). Including at least 3 vessel coronary artery calcifications as well as aortic valve leaflet calcifications. Correlate with aortic stenosis. Electronically Signed   By: Tish Frederickson M.D.   On: 05/18/2021 18:37   CT Cervical Spine Wo Contrast  Result Date: 05/18/2021 CLINICAL DATA:  Loss of consciousness after fall. Status post fall. Nonlocalized acute abdominal pain. EXAM: CT HEAD WITHOUT CONTRAST CT CERVICAL SPINE WITHOUT CONTRAST CT CHEST, ABDOMEN AND PELVIS WITH CONTRAST TECHNIQUE: Contiguous axial images were obtained from the base of the skull through the vertex without intravenous contrast. Multidetector CT imaging of the cervical spine was performed without intravenous contrast. Multiplanar CT image reconstructions were also generated. Multidetector CT imaging of the chest, abdomen and pelvis was performed following the standard protocol during bolus administration of intravenous contrast. CONTRAST:  OMNIPAQUE IOHEXOL 350 MG/ML SOLN COMPARISON:  None. FINDINGS: CT HEAD FINDINGS BRAIN: BRAIN Cerebral ventricle sizes are concordant with the degree of cerebral volume loss. Patchy and confluent areas of decreased attenuation are noted throughout the deep and periventricular white matter of the cerebral hemispheres bilaterally, compatible with chronic microvascular ischemic disease. No evidence of large-territorial acute infarction. No parenchymal hemorrhage. No mass lesion. No extra-axial collection. No mass effect or midline shift. No hydrocephalus. Basilar cisterns are patent. Vascular: No hyperdense vessel. Atherosclerotic calcifications are present within the cavernous internal carotid arteries. Skull: No acute fracture or focal lesion.  Sinuses/Orbits: Paranasal sinuses and mastoid air cells are clear. The orbits are unremarkable. Other: None. CT CERVICAL FINDINGS Alignment: Straightening of the normal cervical lordosis likely due to positioning and degenerative changes. Alignment is otherwise normal. Skull base and vertebrae: Moderate to severe multilevel degenerative changes of the spine. Associated multilevel severe osseous neural foraminal stenosis. No severe osseous central canal stenosis. No acute fracture. No aggressive appearing focal osseous lesion or focal pathologic process. Soft tissues and spinal canal: No prevertebral fluid or swelling. No visible canal hematoma. Upper chest: At least moderate volume left pleural  effusion. No pneumothorax. Other: None. CHEST: Ports and Devices: None. Lungs/airways: Nonspecific thin walled cystic change of the right apex. Couple scattered pulmonary micronodules. Passive atelectasis of the left lower lobe. Passive atelectasis of the lingula. No focal consolidation. No pulmonary mass. No pulmonary contusion or laceration. No pneumatocele formation. The central airways are patent. Pleura: Small to moderate volume left pleural effusion no right pleural effusion. No pneumothorax. No hemothorax. Lymph Nodes: No mediastinal, hilar, or axillary lymphadenopathy. Mediastinum: No pneumomediastinum. No aortic injury or mediastinal hematoma. The thoracic aorta is normal in caliber. Severe calcified and noncalcified atherosclerotic plaque of the thoracic aorta. At least three-vessel coronary calcifications. Mild aortic valve leaflet calcifications. The heart is normal in size. No significant pericardial effusion. The main pulmonary artery is normal in caliber. No pulmonary embolus. The esophagus is unremarkable. The thyroid is unremarkable. Chest Wall / Breasts: No chest wall mass. Musculoskeletal: No acute rib or sternal fracture. No spinal fracture. ABDOMEN / PELVIS: Liver: Not enlarged. Nodular hepatic contour.  No focal lesion. No laceration or subcapsular hematoma. Biliary System: Layering hyperdensity within gallbladder lumen consistent with radio-opaque gallstones. No biliary ductal dilatation. Pancreas: Normal pancreatic contour. No main pancreatic duct dilatation. Spleen: Enlarged in caliber measuring up to at least 18 cm. No focal lesion. No laceration, subcapsular hematoma, or vascular injury. Adrenal Glands: No nodularity bilaterally. Kidneys: Bilateral kidneys enhance symmetrically. No hydronephrosis. No contusion, laceration, or subcapsular hematoma. Exophytic 1.7 cm fluid density lesion within the right kidney likely represents a simple renal cyst. Similar finding on the left measuring up to 1.8 cm. No injury to the vascular structures or collecting systems. No hydroureter. The urinary bladder is unremarkable. Bowel: No small or large bowel wall thickening or dilatation. The appendix is not definitely identified. Mesentery, Omentum, and Peritoneum: Small to moderate volume simple free fluid ascites. No pneumoperitoneum. No hemoperitoneum. No mesenteric hematoma identified. No organized fluid collection. Pelvic Organs: Normal. Lymph Nodes: No abdominal, pelvic, inguinal lymphadenopathy. Vasculature: Recanalized paraumbilical vein. Upper abdominal venous collaterals. Small esophageal varices. The main portal, splenic, superior mesenteric veins are patent. No abdominal aorta or iliac aneurysm. No active contrast extravasation or pseudoaneurysm. Musculoskeletal: No significant soft tissue hematoma. No acute pelvic fracture. No spinal fracture. IMPRESSION: 1. No acute intracranial abnormality. 2. 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 3.  No acute traumatic injury to the chest, abdomen, or pelvis. 4. No acute fracture or traumatic malalignment of the thoracic or lumbar spine. Other  imaging findings of potential clinical significance: 1. No pulmonary embolus. 2. Small to moderate volume left pleural effusion. 3. Cirrhosis with portal hypertension. No focal hepatic lesion on this single-phase portal venous study. Recommend nonemergent MRI liver protocol for further evaluation. When the patient is clinically stable and able to follow directions and hold their breath (preferably as an outpatient) further evaluation with dedicated abdominal MRI should be considered. 4. Cholelithiasis no findings of acute cholecystitis. 5. Aortic Atherosclerosis (ICD10-I70.0). Including at least 3 vessel coronary artery calcifications as well as aortic valve leaflet calcifications. Correlate with aortic stenosis. Electronically Signed   By: Tish Frederickson M.D.   On: 05/18/2021 18:37   CT ABDOMEN PELVIS W CONTRAST  Result Date: 05/18/2021 CLINICAL DATA:  Loss of consciousness after fall. Status post fall. Nonlocalized acute abdominal pain. EXAM: CT HEAD WITHOUT CONTRAST CT CERVICAL SPINE WITHOUT CONTRAST CT CHEST, ABDOMEN AND PELVIS WITH CONTRAST TECHNIQUE: Contiguous axial images were  obtained from the base of the skull through the vertex without intravenous contrast. Multidetector CT imaging of the cervical spine was performed without intravenous contrast. Multiplanar CT image reconstructions were also generated. Multidetector CT imaging of the chest, abdomen and pelvis was performed following the standard protocol during bolus administration of intravenous contrast. CONTRAST:  OMNIPAQUE IOHEXOL 350 MG/ML SOLN COMPARISON:  None. FINDINGS: CT HEAD FINDINGS BRAIN: BRAIN Cerebral ventricle sizes are concordant with the degree of cerebral volume loss. Patchy and confluent areas of decreased attenuation are noted throughout the deep and periventricular white matter of the cerebral hemispheres bilaterally, compatible with chronic microvascular ischemic disease. No evidence of large-territorial acute infarction.  No parenchymal hemorrhage. No mass lesion. No extra-axial collection. No mass effect or midline shift. No hydrocephalus. Basilar cisterns are patent. Vascular: No hyperdense vessel. Atherosclerotic calcifications are present within the cavernous internal carotid arteries. Skull: No acute fracture or focal lesion. Sinuses/Orbits: Paranasal sinuses and mastoid air cells are clear. The orbits are unremarkable. Other: None. CT CERVICAL FINDINGS Alignment: Straightening of the normal cervical lordosis likely due to positioning and degenerative changes. Alignment is otherwise normal. Skull base and vertebrae: Moderate to severe multilevel degenerative changes of the spine. Associated multilevel severe osseous neural foraminal stenosis. No severe osseous central canal stenosis. No acute fracture. No aggressive appearing focal osseous lesion or focal pathologic process. Soft tissues and spinal canal: No prevertebral fluid or swelling. No visible canal hematoma. Upper chest: At least moderate volume left pleural effusion. No pneumothorax. Other: None. CHEST: Ports and Devices: None. Lungs/airways: Nonspecific thin walled cystic change of the right apex. Couple scattered pulmonary micronodules. Passive atelectasis of the left lower lobe. Passive atelectasis of the lingula. No focal consolidation. No pulmonary mass. No pulmonary contusion or laceration. No pneumatocele formation. The central airways are patent. Pleura: Small to moderate volume left pleural effusion no right pleural effusion. No pneumothorax. No hemothorax. Lymph Nodes: No mediastinal, hilar, or axillary lymphadenopathy. Mediastinum: No pneumomediastinum. No aortic injury or mediastinal hematoma. The thoracic aorta is normal in caliber. Severe calcified and noncalcified atherosclerotic plaque of the thoracic aorta. At least three-vessel coronary calcifications. Mild aortic valve leaflet calcifications. The heart is normal in size. No significant pericardial  effusion. The main pulmonary artery is normal in caliber. No pulmonary embolus. The esophagus is unremarkable. The thyroid is unremarkable. Chest Wall / Breasts: No chest wall mass. Musculoskeletal: No acute rib or sternal fracture. No spinal fracture. ABDOMEN / PELVIS: Liver: Not enlarged. Nodular hepatic contour. No focal lesion. No laceration or subcapsular hematoma. Biliary System: Layering hyperdensity within gallbladder lumen consistent with radio-opaque gallstones. No biliary ductal dilatation. Pancreas: Normal pancreatic contour. No main pancreatic duct dilatation. Spleen: Enlarged in caliber measuring up to at least 18 cm. No focal lesion. No laceration, subcapsular hematoma, or vascular injury. Adrenal Glands: No nodularity bilaterally. Kidneys: Bilateral kidneys enhance symmetrically. No hydronephrosis. No contusion, laceration, or subcapsular hematoma. Exophytic 1.7 cm fluid density lesion within the right kidney likely represents a simple renal cyst. Similar finding on the left measuring up to 1.8 cm. No injury to the vascular structures or collecting systems. No hydroureter. The urinary bladder is unremarkable. Bowel: No small or large bowel wall thickening or dilatation. The appendix is not definitely identified. Mesentery, Omentum, and Peritoneum: Small to moderate volume simple free fluid ascites. No pneumoperitoneum. No hemoperitoneum. No mesenteric hematoma identified. No organized fluid collection. Pelvic Organs: Normal. Lymph Nodes: No abdominal, pelvic, inguinal lymphadenopathy. Vasculature: Recanalized paraumbilical vein. Upper abdominal venous collaterals. Small esophageal  varices. The main portal, splenic, superior mesenteric veins are patent. No abdominal aorta or iliac aneurysm. No active contrast extravasation or pseudoaneurysm. Musculoskeletal: No significant soft tissue hematoma. No acute pelvic fracture. No spinal fracture. IMPRESSION: 1. No acute intracranial abnormality. 2. No acute  displaced fracture or traumatic listhesis of the cervical spine in a patient with multilevel severe osseous neural foraminal stenosis due to degenerative changes. 3.  No acute traumatic injury to the chest, abdomen, or pelvis. 4. No acute fracture or traumatic malalignment of the thoracic or lumbar spine. Other imaging findings of potential clinical significance: 1. No pulmonary embolus. 2. Small to moderate volume left pleural effusion. 3. Cirrhosis with portal hypertension. No focal hepatic lesion on this single-phase portal venous study. Recommend nonemergent MRI liver protocol for further evaluation. When the patient is clinically stable and able to follow directions and hold their breath (preferably as an outpatient) further evaluation with dedicated abdominal MRI should be considered. 4. Cholelithiasis no findings of acute cholecystitis. 5. Aortic Atherosclerosis (ICD10-I70.0). Including at least 3 vessel coronary artery calcifications as well as aortic valve leaflet calcifications. Correlate with aortic stenosis. Electronically Signed   By: Tish Frederickson M.D.   On: 05/18/2021 18:37   US Carotid Bilateral  Result Date: 05/18/2021 CLINICAL DATA:  Syncope and collapse. EXAM: BILATERAL CAROTID DUPLEX ULTRASOUND TECHNIQUE: Wallace Cullens scale imaging, color Doppler and duplex ultrasound were performed of bilateral carotid and vertebral arteries in the neck. COMPARISON:  None. FINDINGS: Criteria: Quantification of carotid stenosis is based on velocity parameters that correlate the residual internal carotid diameter with NASCET-based stenosis levels, using the diameter of the distal internal carotid lumen as the denominator for stenosis measurement. The following velocity measurements were obtained: RIGHT ICA: 59 cm/sec CCA: 87 cm/sec SYSTOLIC ICA/CCA RATIO:  0.7 ECA: 67 cm/sec LEFT ICA: 76 cm/sec CCA: 69 cm/sec SYSTOLIC ICA/CCA RATIO:  1.1 ECA: 70 cm/sec RIGHT CAROTID ARTERY: Minimal atherosclerotic plaque in the right  carotid bulb extending into the proximal ICA. There is atherosclerotic plaque in the external carotid artery. RIGHT VERTEBRAL ARTERY:  Patent with anteverted flow. LEFT CAROTID ARTERY: Mild plaque in the carotid bulb extending into the proximal ICA. LEFT VERTEBRAL ARTERY: There appears to be somewhat retrograde flow in the left vertebral artery. The left subclavian artery appears patent with normal direction. Focal stenosis at the origin of the left subclavian artery is not excluded. CT angiography may provide better evaluation. Upper extremity blood pressures: RIGHT: 110/71 LEFT: 105/78 IMPRESSION: 1. Less than 50% ICA stenosis bilaterally. 2. Retrograde appearance of flow in the left vertebral artery suspicious for subclavian steal syndrome. CT angiography is recommended to evaluate for possible proximal subclavian artery stenosis. Electronically Signed   By: Elgie Collard M.D.   On: 05/18/2021 22:26   ECHOCARDIOGRAM COMPLETE  Result Date: 05/19/2021    ECHOCARDIOGRAM REPORT   Patient Name:   Lawrence Osborn Date of Exam: 05/19/2021 Medical Rec #:  [id number]    Height:       68.0 in Accession #:    [id number]   Weight:       274.0 lb Date of Birth:  November 27, 1950    BSA:          2.336 m Patient Age:    70 years     BP:           104/83 mmHg Patient Gender: M            HR:           117  bpm. Exam Location:  [facility] Procedure: 2D Echo, Color Doppler and Cardiac Doppler Indications:     R55 Syncope  History:         Patient has prior history of Echocardiogram examinations, most                  recent 08/30/2018. COPD and DVT; Risk Factors:Diabetes. Hx of                  liver cirrhosis.  Sonographer:     Humphrey Rolls Referring Phys:  Kenn File DOUTOVA Diagnosing Phys: Lorine Bears MD  Sonographer Comments: No parasternal window and no subcostal window. Image acquisition challenging due to patient body habitus and Image acquisition challenging due to COPD. IMPRESSIONS  1. Left ventricular ejection fraction, by  estimation, is 45 to 50%. The left ventricle has mildly decreased function. Left ventricular endocardial border not optimally defined to evaluate regional wall motion. Left ventricular diastolic parameters are indeterminate.  2. Right ventricular systolic function is mildly reduced. The right ventricular size is mildly enlarged. There is normal pulmonary artery systolic pressure.  3. Right atrial size was moderately dilated.  4. The mitral valve is normal in structure. No evidence of mitral valve regurgitation. No evidence of mitral stenosis.  5. The aortic valve is normal in structure. Aortic valve regurgitation is not visualized. Mild to moderate aortic valve sclerosis/calcification is present, without any evidence of aortic stenosis.  6. The inferior vena cava is normal in size with greater than 50% respiratory variability, suggesting right atrial pressure of 3 mmHg.  7. Challenging image quality. FINDINGS  Left Ventricle: Left ventricular ejection fraction, by estimation, is 45 to 50%. The left ventricle has mildly decreased function. Left ventricular endocardial border not optimally defined to evaluate regional wall motion. The left ventricular internal cavity size was normal in size. There is no left ventricular hypertrophy. Left ventricular diastolic parameters are indeterminate. Right Ventricle: The right ventricular size is mildly enlarged. No increase in right ventricular wall thickness. Right ventricular systolic function is mildly reduced. There is normal pulmonary artery systolic pressure. The tricuspid regurgitant velocity  is 2.03 m/s, and with an assumed right atrial pressure of 5 mmHg, the estimated right ventricular systolic pressure is 21.5 mmHg. Left Atrium: Left atrial size was normal in size. Right Atrium: Right atrial size was moderately dilated. Pericardium: There is no evidence of pericardial effusion. Mitral Valve: The mitral valve is normal in structure. No evidence of mitral valve  regurgitation. No evidence of mitral valve stenosis. MV peak gradient, 4.0 mmHg. The mean mitral valve gradient is 2.0 mmHg. Tricuspid Valve: The tricuspid valve is normal in structure. Tricuspid valve regurgitation is trivial. No evidence of tricuspid stenosis. Aortic Valve: The aortic valve is normal in structure. Aortic valve regurgitation is not visualized. Mild to moderate aortic valve sclerosis/calcification is present, without any evidence of aortic stenosis. Aortic valve mean gradient measures 6.0 mmHg. Aortic valve peak gradient measures 10.9 mmHg. Aortic valve area, by VTI measures 2.46 cm. Pulmonic Valve: The pulmonic valve was normal in structure. Pulmonic valve regurgitation is not visualized. No evidence of pulmonic stenosis. Aorta: The aortic root is normal in size and structure. Venous: The inferior vena cava is normal in size with greater than 50% respiratory variability, suggesting right atrial pressure of 3 mmHg. IAS/Shunts: No atrial level shunt detected by color flow Doppler.  LEFT VENTRICLE PLAX 2D LVOT diam:     2.40 cm      Diastology LV SV:  57           LV e' medial:    8.49 cm/s LV SV Index:   24           LV E/e' medial:  10.8 LVOT Area:     4.52 cm     LV e' lateral:   9.57 cm/s                             LV E/e' lateral: 9.6  LV Volumes (MOD) LV vol d, MOD A2C: 100.0 ml LV vol d, MOD A4C: 77.0 ml LV vol s, MOD A2C: 48.6 ml LV vol s, MOD A4C: 38.8 ml LV SV MOD A2C:     51.4 ml LV SV MOD A4C:     77.0 ml LV SV MOD BP:      45.4 ml RIGHT VENTRICLE RV Basal diam:  4.18 cm RV Mid diam:    4.87 cm RV S prime:     12.60 cm/s LEFT ATRIUM             Index       RIGHT ATRIUM           Index LA Vol (A2C):   32.8 ml 14.04 ml/m RA Area:     30.60 cm LA Vol (A4C):   35.7 ml 15.28 ml/m RA Volume:   110.00 ml 47.08 ml/m LA Biplane Vol: 35.8 ml 15.32 ml/m  AORTIC VALVE AV Area (Vmax):    2.18 cm AV Area (Vmean):   2.13 cm AV Area (VTI):     2.46 cm AV Vmax:           165.00 cm/s AV  Vmean:          112.000 cm/s AV VTI:            0.230 m AV Peak Grad:      10.9 mmHg AV Mean Grad:      6.0 mmHg LVOT Vmax:         79.60 cm/s LVOT Vmean:        52.700 cm/s LVOT VTI:          0.125 m LVOT/AV VTI ratio: 0.54  AORTA Ao Root diam: 2.60 cm MITRAL VALVE               TRICUSPID VALVE MV Area (PHT): 5.20 cm    TR Peak grad:   16.5 mmHg MV Area VTI:   4.63 cm    TR Vmax:        203.00 cm/s MV Peak grad:  4.0 mmHg MV Mean grad:  2.0 mmHg    SHUNTS MV Vmax:       1.00 m/s    Systemic VTI:  0.12 m MV Vmean:      74.7 cm/s   Systemic Diam: 2.40 cm MV Decel Time: 146 msec MV E velocity: 91.43 cm/s Lorine Bears MD Electronically signed by Lorine Bears MD Signature Date/Time: 05/19/2021/4:35:30 PM    Final    [place] ASCITES (ABDOMEN LIMITED)  Result Date: 05/19/2021 CLINICAL DATA:  Ascites EXAM: LIMITED ABDOMEN ULTRASOUND FOR ASCITES TECHNIQUE: Limited ultrasound survey for ascites was performed in all four abdominal quadrants. COMPARISON:  CT 05/18/2021 FINDINGS: There is moderate volume abdominal ascites seen in the left upper and lower quadrants. No ascites seen in the right upper or right lower quadrants. IMPRESSION: Moderate volume ascites seen in the left upper and lower quadrants. Electronically Signed  By: Caprice Renshaw M.D.   On: 05/19/2021 14:37   Medications: I have reviewed the patient's current medications. Prior to Admission:  Medications Prior to Admission  Medication Sig Dispense Refill Last Dose   albuterol (PROVENTIL HFA;VENTOLIN HFA) 108 (90 Base) MCG/ACT inhaler Inhale 2 puffs into the lungs every 6 (six) hours as needed for wheezing or shortness of breath.   05/18/2021 at 1200   apixaban (ELIQUIS) 5 MG TABS tablet Take 1 tablet (5 mg total) by mouth 2 (two) times daily. 60 tablet  05/18/2021 at 1200   Cyanocobalamin (B-12) 2500 MCG TABS Take 2,500 mcg by mouth daily.   05/18/2021 at 200   ferrous gluconate (FERGON) 324 MG tablet Take 324 mg by mouth daily with breakfast.   05/18/2021 at  1200   furosemide (LASIX) 20 MG tablet Take 20 mg by mouth daily as needed for edema.   05/18/2021 at 1200   insulin glargine (LANTUS) 100 UNIT/ML injection Inject 35 Units into the skin 2 (two) times daily.   05/18/2021 at 1200   lactulose (CHRONULAC) 10 GM/15ML solution Take 20 g by mouth 2 (two) times daily.   05/17/2021 at 2100   levothyroxine (SYNTHROID, LEVOTHROID) 100 MCG tablet Take 100 mcg by mouth daily before breakfast.   05/18/2021 at 1200   nadolol (CORGARD) 40 MG tablet Take 2 tablets (80 mg total) by mouth daily. (Patient taking differently: Take 40 mg by mouth daily.)   05/18/2021 at 1200   omega-3 fish oil (MAXEPA) 1000 MG CAPS capsule Take 1,000 mg by mouth 2 (two) times daily.   05/18/2021 at 1200   omeprazole (PRILOSEC) 20 MG capsule Take 20 mg by mouth daily.   05/18/2021 at 1200   QUEtiapine (SEROQUEL) 100 MG tablet Take 100-200 mg by mouth See admin instructions. Take 100 mg in the morning and 200 mg at bedtime   05/18/2021 at 1200   spironolactone (ALDACTONE) 50 MG tablet Take 50 mg by mouth daily.   05/18/2021 at 1200   traMADol (ULTRAM) 50 MG tablet Take 50 mg by mouth 2 (two) times daily.   05/18/2021 at 1200   diltiazem (CARDIZEM CD) 180 MG 24 hr capsule Take 180 mg by mouth at bedtime. (Patient not taking: Reported on 05/18/2021)   Not Taking   HYDROcodone-acetaminophen (NORCO/VICODIN) 5-325 MG tablet Take 1 tablet by mouth every 4 (four) hours as needed for moderate pain. (Patient not taking: No sig reported) 8 tablet 0 Not Taking   Semaglutide, 1 MG/DOSE, (OZEMPIC, 1 MG/DOSE,) 2 MG/1.5ML SOPN Inject 1 mg into the skin every Sunday. (Patient not taking: Reported on 05/18/2021)   Not Taking   [facility] WORT PO Take 2 tablets by mouth 2 (two) times daily. (Patient not taking: Reported on 05/18/2021)   Not Taking   Scheduled:  apixaban  5 mg Oral BID   digoxin  0.25 mg Oral Daily   folic acid  1 mg Oral Daily   insulin aspart  0-9 Units Subcutaneous Q4H   insulin glargine-yfgn  30 Units  Subcutaneous BID   lactulose  30 g Oral TID   levothyroxine  100 mcg Oral QAC breakfast   LORazepam  0-4 mg Intravenous Q6H   Or   LORazepam  0-4 mg Oral Q6H   [START ON 05/21/2021] LORazepam  0-4 mg Intravenous Q12H   Or   [START ON 05/21/2021] LORazepam  0-4 mg Oral Q12H   metoprolol tartrate  25 mg Oral BID   midodrine  5 mg Oral  TID WC   rifaximin  550 mg Oral BID   sodium chloride flush  3 mL Intravenous Q12H   thiamine  100 mg Oral Daily   Or   thiamine  100 mg Intravenous Daily   Continuous:  sodium chloride     ZOX:WRUEAV chloride, albuterol, sodium chloride flush Anti-infectives (From admission, onward)    Start     Dose/Rate Route Frequency Ordered Stop   05/19/21 1330  rifaximin (XIFAXAN) tablet 550 mg        550 mg Oral 2 times daily 05/19/21 1251        Scheduled Meds:  apixaban  5 mg Oral BID   digoxin  0.25 mg Oral Daily   folic acid  1 mg Oral Daily   insulin aspart  0-9 Units Subcutaneous Q4H   insulin glargine-yfgn  30 Units Subcutaneous BID   lactulose  30 g Oral TID   levothyroxine  100 mcg Oral QAC breakfast   LORazepam  0-4 mg Intravenous Q6H   Or   LORazepam  0-4 mg Oral Q6H   [START ON 05/21/2021] LORazepam  0-4 mg Intravenous Q12H   Or   [START ON 05/21/2021] LORazepam  0-4 mg Oral Q12H   metoprolol tartrate  25 mg Oral BID   midodrine  5 mg Oral TID WC   rifaximin  550 mg Oral BID   sodium chloride flush  3 mL Intravenous Q12H   thiamine  100 mg Oral Daily   Or   thiamine  100 mg Intravenous Daily   Continuous Infusions:  sodium chloride     PRN Meds:.sodium chloride, albuterol, sodium chloride flush   Assessment: Active Problems:   Type 2 diabetes mellitus with peripheral neuropathy (HCC)   COPD (chronic obstructive pulmonary disease) (HCC)   Hypotension   PAF (paroxysmal atrial fibrillation) (HCC)   Hypothyroidism   Syncope   Thrombocytopenia (HCC)   Alcoholic cirrhosis of liver without ascites (HCC)   Chronic alcohol abuse    Gastroesophageal reflux disease without esophagitis   Acute respiratory failure (HCC)   Acute hepatic encephalopathy   Prolonged QT interval  Decompensated alcoholic cirrhosis, Childs class C, meld sodium 18 is admitted with hypotension, syncopal episode  Plan: Decompensated alcoholic cirrhosis Patient is volume overloaded, ultrasound revealed moderate ascites If okay with cardiology, recommend to start low-dose diuretics Lasix 20/40 mg and spironolactone 50/100 mg daily Continue low-sodium diet If abdominal distention is worsening, patient will need therapeutic paracentesis with complete fluid analysis.  Patient is currently on Eliquis, will need to be interrupted for 48 hours before undergoing paracentesis EGD as outpatient for variceal screening, no signs of GI bleed at this time Macrocytic anemia, likely secondary to alcohol abuse and cirrhosis-check iron panel, B12 and folate levels, replete if low Continue rifaximin and lactulose Patient does not want to go to alcohol Anonymous.  He said he will do it on his own Appreciate cardiology recommendations Follow-up with Dr. Tobi Bastos upon discharge to establish care for cirrhosis   LOS: 2 days   Lawrence Osborn 05/20/2021, 10:35 AM

## 2021-05-20 NOTE — Progress Notes (Signed)
Progress Note  Patient Name: Lawrence Osborn Date of Encounter: 05/20/2021  [facility] Cardiologist: new Dr. Kirke Corin rounding  Subjective   Denies palpitations or chest pain.  Curious as to why he falls so often.  Endorses drinking 2 cans of beer daily.  Inpatient Medications    Scheduled Meds:  apixaban  5 mg Oral BID   digoxin  0.25 mg Oral Daily   folic acid  1 mg Oral Daily   furosemide  20 mg Intravenous BID   insulin aspart  0-9 Units Subcutaneous Q4H   insulin glargine-yfgn  30 Units Subcutaneous BID   lactulose  30 g Oral TID   levothyroxine  100 mcg Oral QAC breakfast   LORazepam  0-4 mg Intravenous Q6H   Or   LORazepam  0-4 mg Oral Q6H   [START ON 05/21/2021] LORazepam  0-4 mg Intravenous Q12H   Or   [START ON 05/21/2021] LORazepam  0-4 mg Oral Q12H   metoprolol tartrate  25 mg Oral BID   midodrine  10 mg Oral TID WC   rifaximin  550 mg Oral BID   sodium chloride flush  3 mL Intravenous Q12H   thiamine  100 mg Oral Daily   Or   thiamine  100 mg Intravenous Daily   Continuous Infusions:  sodium chloride     PRN Meds: sodium chloride, albuterol, sodium chloride flush   Vital Signs    Vitals:   05/20/21 0000 05/20/21 0500 05/20/21 0734 05/20/21 1210  BP: 106/65 117/67 112/74 116/67  Pulse:  (!) 117 (!) 118 (!) 117  Resp: 20 20 18 19   Temp: 97.6 F (36.4 C) 98.2 F (36.8 C) 97.9 F (36.6 C) 97.8 F (36.6 C)  TempSrc: Oral Oral    SpO2: 95% 97% 98% 98%  Weight:      Height:        Intake/Output Summary (Last 24 hours) at 05/20/2021 1415 Last data filed at 05/20/2021 0700 Gross per 24 hour  Intake 240 ml  Output --  Net 240 ml   Last 3 Weights 05/19/2021 05/18/2021 04/11/2021  Weight (lbs) 260 lb 6.4 oz 274 lb 288 lb 1.6 oz  Weight (kg) 118.117 kg 124.286 kg 130.681 kg      Telemetry    Atrial flutter heart rate 118- Personally Reviewed  ECG     - Personally Reviewed  Physical Exam   GEN: No acute distress.   Neck: No JVD Cardiac:  Tachycardic, regular Respiratory: Diminished breath sounds at bases GI: Soft, nontender, severely distended  MS: No edema; No deformity. Neuro:  Nonfocal  Psych: Normal affect   Labs    High Sensitivity Troponin:   Recent Labs  Lab 05/18/21 1602 05/18/21 1759  TROPONINIHS 10 9      Chemistry Recent Labs  Lab 05/18/21 1602 05/19/21 0410  NA 133* 135  K 4.4 3.9  CL 95* 100  CO2 31 30  GLUCOSE 116* 85  BUN 8 8  CREATININE 0.82 0.71  CALCIUM 8.8* 8.1*  PROT 6.8 5.8*  ALBUMIN 2.5* 2.4*  AST 50* 39  ALT 20 15  ALKPHOS 141* 106  BILITOT 3.5* 2.6*  GFRNONAA >60 >60  ANIONGAP 7 5     Hematology Recent Labs  Lab 05/18/21 1602 05/19/21 0410  WBC 5.5 4.2  RBC 4.18* 3.55*  HGB 15.2 12.9*  HCT 42.1 36.4*  MCV 100.7* 102.5*  MCH 36.4* 36.3*  MCHC 36.1* 35.4  RDW 13.8 13.9  PLT 94* 68*  BNP Recent Labs  Lab 05/18/21 1602  BNP 318.3*     DDimer No results for input(s): DDIMER in the last 168 hours.   Radiology    DG Chest 2 View  Result Date: 05/18/2021 CLINICAL DATA:  Syncope. EXAM: CHEST - 2 VIEW COMPARISON:  April 11, 2021. FINDINGS: The heart size and mediastinal contours are within normal limits. No pneumothorax is noted. Right lung is clear. Small left pleural effusion is noted with adjacent left basilar atelectasis. The visualized skeletal structures are unremarkable. IMPRESSION: Small left pleural effusion is noted with adjacent left basilar atelectasis. Aortic Atherosclerosis (ICD10-I70.0). Electronically Signed   By: Lupita Raider M.D.   On: 05/18/2021 16:55   CT HEAD WO CONTRAST  Result Date: 05/18/2021 CLINICAL DATA:  Loss of consciousness after fall. Status post fall. Nonlocalized acute abdominal pain. EXAM: CT HEAD WITHOUT CONTRAST CT CERVICAL SPINE WITHOUT CONTRAST CT CHEST, ABDOMEN AND PELVIS WITH CONTRAST TECHNIQUE: Contiguous axial images were obtained from the base of the skull through the vertex without intravenous contrast. Multidetector  CT imaging of the cervical spine was performed without intravenous contrast. Multiplanar CT image reconstructions were also generated. Multidetector CT imaging of the chest, abdomen and pelvis was performed following the standard protocol during bolus administration of intravenous contrast. CONTRAST:  OMNIPAQUE IOHEXOL 350 MG/ML SOLN COMPARISON:  None. FINDINGS: CT HEAD FINDINGS BRAIN: BRAIN Cerebral ventricle sizes are concordant with the degree of cerebral volume loss. Patchy and confluent areas of decreased attenuation are noted throughout the deep and periventricular white matter of the cerebral hemispheres bilaterally, compatible with chronic microvascular ischemic disease. No evidence of large-territorial acute infarction. No parenchymal hemorrhage. No mass lesion. No extra-axial collection. No mass effect or midline shift. No hydrocephalus. Basilar cisterns are patent. Vascular: No hyperdense vessel. Atherosclerotic calcifications are present within the cavernous internal carotid arteries. Skull: No acute fracture or focal lesion. Sinuses/Orbits: Paranasal sinuses and mastoid air cells are clear. The orbits are unremarkable. Other: None. CT CERVICAL FINDINGS Alignment: Straightening of the normal cervical lordosis likely due to positioning and degenerative changes. Alignment is otherwise normal. Skull base and vertebrae: Moderate to severe multilevel degenerative changes of the spine. Associated multilevel severe osseous neural foraminal stenosis. No severe osseous central canal stenosis. No acute fracture. No aggressive appearing focal osseous lesion or focal pathologic process. Soft tissues and spinal canal: No prevertebral fluid or swelling. No visible canal hematoma. Upper chest: At least moderate volume left pleural effusion. No pneumothorax. Other: None. CHEST: Ports and Devices: None. Lungs/airways: Nonspecific thin walled cystic change of the right apex. Couple scattered pulmonary micronodules.  Passive atelectasis of the left lower lobe. Passive atelectasis of the lingula. No focal consolidation. No pulmonary mass. No pulmonary contusion or laceration. No pneumatocele formation. The central airways are patent. Pleura: Small to moderate volume left pleural effusion no right pleural effusion. No pneumothorax. No hemothorax. Lymph Nodes: No mediastinal, hilar, or axillary lymphadenopathy. Mediastinum: No pneumomediastinum. No aortic injury or mediastinal hematoma. The thoracic aorta is normal in caliber. Severe calcified and noncalcified atherosclerotic plaque of the thoracic aorta. At least three-vessel coronary calcifications. Mild aortic valve leaflet calcifications. The heart is normal in size. No significant pericardial effusion. The main pulmonary artery is normal in caliber. No pulmonary embolus. The esophagus is unremarkable. The thyroid is unremarkable. Chest Wall / Breasts: No chest wall mass. Musculoskeletal: No acute rib or sternal fracture. No spinal fracture. ABDOMEN / PELVIS: Liver: Not enlarged. Nodular hepatic contour. No focal lesion. No laceration or subcapsular  hematoma. Biliary System: Layering hyperdensity within gallbladder lumen consistent with radio-opaque gallstones. No biliary ductal dilatation. Pancreas: Normal pancreatic contour. No main pancreatic duct dilatation. Spleen: Enlarged in caliber measuring up to at least 18 cm. No focal lesion. No laceration, subcapsular hematoma, or vascular injury. Adrenal Glands: No nodularity bilaterally. Kidneys: Bilateral kidneys enhance symmetrically. No hydronephrosis. No contusion, laceration, or subcapsular hematoma. Exophytic 1.7 cm fluid density lesion within the right kidney likely represents a simple renal cyst. Similar finding on the left measuring up to 1.8 cm. No injury to the vascular structures or collecting systems. No hydroureter. The urinary bladder is unremarkable. Bowel: No small or large bowel wall thickening or dilatation.  The appendix is not definitely identified. Mesentery, Omentum, and Peritoneum: Small to moderate volume simple free fluid ascites. No pneumoperitoneum. No hemoperitoneum. No mesenteric hematoma identified. No organized fluid collection. Pelvic Organs: Normal. Lymph Nodes: No abdominal, pelvic, inguinal lymphadenopathy. Vasculature: Recanalized paraumbilical vein. Upper abdominal venous collaterals. Small esophageal varices. The main portal, splenic, superior mesenteric veins are patent. No abdominal aorta or iliac aneurysm. No active contrast extravasation or pseudoaneurysm. Musculoskeletal: No significant soft tissue hematoma. No acute pelvic fracture. No spinal fracture. IMPRESSION: 1. No acute intracranial abnormality. 2. 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 3.  No acute traumatic injury to the chest, abdomen, or pelvis. 4. No acute fracture or traumatic malalignment of the thoracic or lumbar spine. Other imaging findings of potential clinical significance: 1. No pulmonary embolus. 2. Small to moderate volume left pleural effusion. 3. Cirrhosis with portal hypertension. No focal hepatic lesion on this single-phase portal venous study. Recommend nonemergent MRI liver protocol for further evaluation. When the patient is clinically stable and able to follow directions and hold their breath (preferably as an outpatient) further evaluation with dedicated abdominal MRI should be considered. 4. Cholelithiasis no findings of acute cholecystitis. 5. Aortic Atherosclerosis (ICD10-I70.0). Including at least 3 vessel coronary artery calcifications as well as aortic valve leaflet calcifications. Correlate with aortic stenosis. Electronically Signed   By: Tish Frederickson M.D.   On: 05/18/2021 18:37   CT Angio Chest PE W and/or Wo Contrast  Result Date: 05/18/2021 CLINICAL DATA:  Loss of  consciousness after fall. Status post fall. Nonlocalized acute abdominal pain. EXAM: CT HEAD WITHOUT CONTRAST CT CERVICAL SPINE WITHOUT CONTRAST CT CHEST, ABDOMEN AND PELVIS WITH CONTRAST TECHNIQUE: Contiguous axial images were obtained from the base of the skull through the vertex without intravenous contrast. Multidetector CT imaging of the cervical spine was performed without intravenous contrast. Multiplanar CT image reconstructions were also generated. Multidetector CT imaging of the chest, abdomen and pelvis was performed following the standard protocol during bolus administration of intravenous contrast. CONTRAST:  OMNIPAQUE IOHEXOL 350 MG/ML SOLN COMPARISON:  None. FINDINGS: CT HEAD FINDINGS BRAIN: BRAIN Cerebral ventricle sizes are concordant with the degree of cerebral volume loss. Patchy and confluent areas of decreased attenuation are noted throughout the deep and periventricular white matter of the cerebral hemispheres bilaterally, compatible with chronic microvascular ischemic disease. No evidence of large-territorial acute infarction. No parenchymal hemorrhage. No mass lesion. No extra-axial collection. No mass effect or midline shift. No hydrocephalus. Basilar cisterns are patent. Vascular: No hyperdense vessel. Atherosclerotic calcifications are present within the cavernous internal carotid arteries. Skull: No acute fracture or focal lesion. Sinuses/Orbits: Paranasal sinuses and mastoid air cells are clear. The orbits are unremarkable. Other: None. CT CERVICAL FINDINGS  Alignment: Straightening of the normal cervical lordosis likely due to positioning and degenerative changes. Alignment is otherwise normal. Skull base and vertebrae: Moderate to severe multilevel degenerative changes of the spine. Associated multilevel severe osseous neural foraminal stenosis. No severe osseous central canal stenosis. No acute fracture. No aggressive appearing focal osseous lesion or focal pathologic process.  Soft tissues and spinal canal: No prevertebral fluid or swelling. No visible canal hematoma. Upper chest: At least moderate volume left pleural effusion. No pneumothorax. Other: None. CHEST: Ports and Devices: None. Lungs/airways: Nonspecific thin walled cystic change of the right apex. Couple scattered pulmonary micronodules. Passive atelectasis of the left lower lobe. Passive atelectasis of the lingula. No focal consolidation. No pulmonary mass. No pulmonary contusion or laceration. No pneumatocele formation. The central airways are patent. Pleura: Small to moderate volume left pleural effusion no right pleural effusion. No pneumothorax. No hemothorax. Lymph Nodes: No mediastinal, hilar, or axillary lymphadenopathy. Mediastinum: No pneumomediastinum. No aortic injury or mediastinal hematoma. The thoracic aorta is normal in caliber. Severe calcified and noncalcified atherosclerotic plaque of the thoracic aorta. At least three-vessel coronary calcifications. Mild aortic valve leaflet calcifications. The heart is normal in size. No significant pericardial effusion. The main pulmonary artery is normal in caliber. No pulmonary embolus. The esophagus is unremarkable. The thyroid is unremarkable. Chest Wall / Breasts: No chest wall mass. Musculoskeletal: No acute rib or sternal fracture. No spinal fracture. ABDOMEN / PELVIS: Liver: Not enlarged. Nodular hepatic contour. No focal lesion. No laceration or subcapsular hematoma. Biliary System: Layering hyperdensity within gallbladder lumen consistent with radio-opaque gallstones. No biliary ductal dilatation. Pancreas: Normal pancreatic contour. No main pancreatic duct dilatation. Spleen: Enlarged in caliber measuring up to at least 18 cm. No focal lesion. No laceration, subcapsular hematoma, or vascular injury. Adrenal Glands: No nodularity bilaterally. Kidneys: Bilateral kidneys enhance symmetrically. No hydronephrosis. No contusion, laceration, or subcapsular hematoma.  Exophytic 1.7 cm fluid density lesion within the right kidney likely represents a simple renal cyst. Similar finding on the left measuring up to 1.8 cm. No injury to the vascular structures or collecting systems. No hydroureter. The urinary bladder is unremarkable. Bowel: No small or large bowel wall thickening or dilatation. The appendix is not definitely identified. Mesentery, Omentum, and Peritoneum: Small to moderate volume simple free fluid ascites. No pneumoperitoneum. No hemoperitoneum. No mesenteric hematoma identified. No organized fluid collection. Pelvic Organs: Normal. Lymph Nodes: No abdominal, pelvic, inguinal lymphadenopathy. Vasculature: Recanalized paraumbilical vein. Upper abdominal venous collaterals. Small esophageal varices. The main portal, splenic, superior mesenteric veins are patent. No abdominal aorta or iliac aneurysm. No active contrast extravasation or pseudoaneurysm. Musculoskeletal: No significant soft tissue hematoma. No acute pelvic fracture. No spinal fracture. IMPRESSION: 1. No acute intracranial abnormality. 2. 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 3.  No acute traumatic injury to the chest, abdomen, or pelvis. 4. No acute fracture or traumatic malalignment of the thoracic or lumbar spine. Other imaging findings of potential clinical significance: 1. No pulmonary embolus. 2. Small to moderate volume left pleural effusion. 3. Cirrhosis with portal hypertension. No focal hepatic lesion on this single-phase portal venous study. Recommend nonemergent MRI liver protocol for further evaluation. When the patient is clinically stable and able to follow directions and hold their breath (preferably as an outpatient) further evaluation with dedicated abdominal MRI should be considered. 4. Cholelithiasis no findings of acute cholecystitis. 5. Aortic  Atherosclerosis (ICD10-I70.0). Including  at least 3 vessel coronary artery calcifications as well as aortic valve leaflet calcifications. Correlate with aortic stenosis. Electronically Signed   By: Tish Frederickson M.D.   On: 05/18/2021 18:37   CT Cervical Spine Wo Contrast  Result Date: 05/18/2021 CLINICAL DATA:  Loss of consciousness after fall. Status post fall. Nonlocalized acute abdominal pain. EXAM: CT HEAD WITHOUT CONTRAST CT CERVICAL SPINE WITHOUT CONTRAST CT CHEST, ABDOMEN AND PELVIS WITH CONTRAST TECHNIQUE: Contiguous axial images were obtained from the base of the skull through the vertex without intravenous contrast. Multidetector CT imaging of the cervical spine was performed without intravenous contrast. Multiplanar CT image reconstructions were also generated. Multidetector CT imaging of the chest, abdomen and pelvis was performed following the standard protocol during bolus administration of intravenous contrast. CONTRAST:  OMNIPAQUE IOHEXOL 350 MG/ML SOLN COMPARISON:  None. FINDINGS: CT HEAD FINDINGS BRAIN: BRAIN Cerebral ventricle sizes are concordant with the degree of cerebral volume loss. Patchy and confluent areas of decreased attenuation are noted throughout the deep and periventricular white matter of the cerebral hemispheres bilaterally, compatible with chronic microvascular ischemic disease. No evidence of large-territorial acute infarction. No parenchymal hemorrhage. No mass lesion. No extra-axial collection. No mass effect or midline shift. No hydrocephalus. Basilar cisterns are patent. Vascular: No hyperdense vessel. Atherosclerotic calcifications are present within the cavernous internal carotid arteries. Skull: No acute fracture or focal lesion. Sinuses/Orbits: Paranasal sinuses and mastoid air cells are clear. The orbits are unremarkable. Other: None. CT CERVICAL FINDINGS Alignment: Straightening of the normal cervical lordosis likely due to positioning and degenerative  changes. Alignment is otherwise normal. Skull base and vertebrae: Moderate to severe multilevel degenerative changes of the spine. Associated multilevel severe osseous neural foraminal stenosis. No severe osseous central canal stenosis. No acute fracture. No aggressive appearing focal osseous lesion or focal pathologic process. Soft tissues and spinal canal: No prevertebral fluid or swelling. No visible canal hematoma. Upper chest: At least moderate volume left pleural effusion. No pneumothorax. Other: None. CHEST: Ports and Devices: None. Lungs/airways: Nonspecific thin walled cystic change of the right apex. Couple scattered pulmonary micronodules. Passive atelectasis of the left lower lobe. Passive atelectasis of the lingula. No focal consolidation. No pulmonary mass. No pulmonary contusion or laceration. No pneumatocele formation. The central airways are patent. Pleura: Small to moderate volume left pleural effusion no right pleural effusion. No pneumothorax. No hemothorax. Lymph Nodes: No mediastinal, hilar, or axillary lymphadenopathy. Mediastinum: No pneumomediastinum. No aortic injury or mediastinal hematoma. The thoracic aorta is normal in caliber. Severe calcified and noncalcified atherosclerotic plaque of the thoracic aorta. At least three-vessel coronary calcifications. Mild aortic valve leaflet calcifications. The heart is normal in size. No significant pericardial effusion. The main pulmonary artery is normal in caliber. No pulmonary embolus. The esophagus is unremarkable. The thyroid is unremarkable. Chest Wall / Breasts: No chest wall mass. Musculoskeletal: No acute rib or sternal fracture. No spinal fracture. ABDOMEN / PELVIS: Liver: Not enlarged. Nodular hepatic contour. No focal lesion. No laceration or subcapsular hematoma. Biliary System: Layering hyperdensity within gallbladder lumen consistent with radio-opaque gallstones. No biliary ductal dilatation. Pancreas: Normal pancreatic contour. No  main pancreatic duct dilatation. Spleen: Enlarged in caliber measuring up to at least 18 cm. No focal lesion. No laceration, subcapsular hematoma, or vascular injury. Adrenal Glands: No nodularity bilaterally. Kidneys: Bilateral kidneys enhance symmetrically. No hydronephrosis. No contusion, laceration, or subcapsular hematoma. Exophytic 1.7 cm fluid density lesion within the right kidney likely represents a simple renal cyst. Similar finding on the left measuring  up to 1.8 cm. No injury to the vascular structures or collecting systems. No hydroureter. The urinary bladder is unremarkable. Bowel: No small or large bowel wall thickening or dilatation. The appendix is not definitely identified. Mesentery, Omentum, and Peritoneum: Small to moderate volume simple free fluid ascites. No pneumoperitoneum. No hemoperitoneum. No mesenteric hematoma identified. No organized fluid collection. Pelvic Organs: Normal. Lymph Nodes: No abdominal, pelvic, inguinal lymphadenopathy. Vasculature: Recanalized paraumbilical vein. Upper abdominal venous collaterals. Small esophageal varices. The main portal, splenic, superior mesenteric veins are patent. No abdominal aorta or iliac aneurysm. No active contrast extravasation or pseudoaneurysm. Musculoskeletal: No significant soft tissue hematoma. No acute pelvic fracture. No spinal fracture. IMPRESSION: 1. No acute intracranial abnormality. 2. 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 3.  No acute traumatic injury to the chest, abdomen, or pelvis. 4. No acute fracture or traumatic malalignment of the thoracic or lumbar spine. Other imaging findings of potential clinical significance: 1. No pulmonary embolus. 2. Small to moderate volume left pleural effusion. 3. Cirrhosis with portal hypertension. No focal hepatic lesion on this single-phase portal venous study.  Recommend nonemergent MRI liver protocol for further evaluation. When the patient is clinically stable and able to follow directions and hold their breath (preferably as an outpatient) further evaluation with dedicated abdominal MRI should be considered. 4. Cholelithiasis no findings of acute cholecystitis. 5. Aortic Atherosclerosis (ICD10-I70.0). Including at least 3 vessel coronary artery calcifications as well as aortic valve leaflet calcifications. Correlate with aortic stenosis. Electronically Signed   By: Tish Frederickson M.D.   On: 05/18/2021 18:37   CT ABDOMEN PELVIS W CONTRAST  Result Date: 05/18/2021 CLINICAL DATA:  Loss of consciousness after fall. Status post fall. Nonlocalized acute abdominal pain. EXAM: CT HEAD WITHOUT CONTRAST CT CERVICAL SPINE WITHOUT CONTRAST CT CHEST, ABDOMEN AND PELVIS WITH CONTRAST TECHNIQUE: Contiguous axial images were obtained from the base of the skull through the vertex without intravenous contrast. Multidetector CT imaging of the cervical spine was performed without intravenous contrast. Multiplanar CT image reconstructions were also generated. Multidetector CT imaging of the chest, abdomen and pelvis was performed following the standard protocol during bolus administration of intravenous contrast. CONTRAST:  OMNIPAQUE IOHEXOL 350 MG/ML SOLN COMPARISON:  None. FINDINGS: CT HEAD FINDINGS BRAIN: BRAIN Cerebral ventricle sizes are concordant with the degree of cerebral volume loss. Patchy and confluent areas of decreased attenuation are noted throughout the deep and periventricular white matter of the cerebral hemispheres bilaterally, compatible with chronic microvascular ischemic disease. No evidence of large-territorial acute infarction. No parenchymal hemorrhage. No mass lesion. No extra-axial collection. No mass effect or midline shift. No hydrocephalus. Basilar cisterns are patent. Vascular: No hyperdense vessel. Atherosclerotic calcifications are present within  the cavernous internal carotid arteries. Skull: No acute fracture or focal lesion. Sinuses/Orbits: Paranasal sinuses and mastoid air cells are clear. The orbits are unremarkable. Other: None. CT CERVICAL FINDINGS Alignment: Straightening of the normal cervical lordosis likely due to positioning and degenerative changes. Alignment is otherwise normal. Skull base and vertebrae: Moderate to severe multilevel degenerative changes of the spine. Associated multilevel severe osseous neural foraminal stenosis. No severe osseous central canal stenosis. No acute fracture. No aggressive appearing focal osseous lesion or focal pathologic process. Soft tissues and spinal canal: No prevertebral fluid or swelling. No visible canal hematoma. Upper chest: At least moderate volume left pleural effusion. No pneumothorax. Other: None. CHEST: Ports and Devices: None.  Lungs/airways: Nonspecific thin walled cystic change of the right apex. Couple scattered pulmonary micronodules. Passive atelectasis of the left lower lobe. Passive atelectasis of the lingula. No focal consolidation. No pulmonary mass. No pulmonary contusion or laceration. No pneumatocele formation. The central airways are patent. Pleura: Small to moderate volume left pleural effusion no right pleural effusion. No pneumothorax. No hemothorax. Lymph Nodes: No mediastinal, hilar, or axillary lymphadenopathy. Mediastinum: No pneumomediastinum. No aortic injury or mediastinal hematoma. The thoracic aorta is normal in caliber. Severe calcified and noncalcified atherosclerotic plaque of the thoracic aorta. At least three-vessel coronary calcifications. Mild aortic valve leaflet calcifications. The heart is normal in size. No significant pericardial effusion. The main pulmonary artery is normal in caliber. No pulmonary embolus. The esophagus is unremarkable. The thyroid is unremarkable. Chest Wall / Breasts: No chest wall mass. Musculoskeletal: No acute rib or sternal fracture. No  spinal fracture. ABDOMEN / PELVIS: Liver: Not enlarged. Nodular hepatic contour. No focal lesion. No laceration or subcapsular hematoma. Biliary System: Layering hyperdensity within gallbladder lumen consistent with radio-opaque gallstones. No biliary ductal dilatation. Pancreas: Normal pancreatic contour. No main pancreatic duct dilatation. Spleen: Enlarged in caliber measuring up to at least 18 cm. No focal lesion. No laceration, subcapsular hematoma, or vascular injury. Adrenal Glands: No nodularity bilaterally. Kidneys: Bilateral kidneys enhance symmetrically. No hydronephrosis. No contusion, laceration, or subcapsular hematoma. Exophytic 1.7 cm fluid density lesion within the right kidney likely represents a simple renal cyst. Similar finding on the left measuring up to 1.8 cm. No injury to the vascular structures or collecting systems. No hydroureter. The urinary bladder is unremarkable. Bowel: No small or large bowel wall thickening or dilatation. The appendix is not definitely identified. Mesentery, Omentum, and Peritoneum: Small to moderate volume simple free fluid ascites. No pneumoperitoneum. No hemoperitoneum. No mesenteric hematoma identified. No organized fluid collection. Pelvic Organs: Normal. Lymph Nodes: No abdominal, pelvic, inguinal lymphadenopathy. Vasculature: Recanalized paraumbilical vein. Upper abdominal venous collaterals. Small esophageal varices. The main portal, splenic, superior mesenteric veins are patent. No abdominal aorta or iliac aneurysm. No active contrast extravasation or pseudoaneurysm. Musculoskeletal: No significant soft tissue hematoma. No acute pelvic fracture. No spinal fracture. IMPRESSION: 1. No acute intracranial abnormality. 2. 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 3.  No acute traumatic injury to the chest, abdomen, or pelvis. 4. No  acute fracture or traumatic malalignment of the thoracic or lumbar spine. Other imaging findings of potential clinical significance: 1. No pulmonary embolus. 2. Small to moderate volume left pleural effusion. 3. Cirrhosis with portal hypertension. No focal hepatic lesion on this single-phase portal venous study. Recommend nonemergent MRI liver protocol for further evaluation. When the patient is clinically stable and able to follow directions and hold their breath (preferably as an outpatient) further evaluation with dedicated abdominal MRI should be considered. 4. Cholelithiasis no findings of acute cholecystitis. 5. Aortic Atherosclerosis (ICD10-I70.0). Including at least 3 vessel coronary artery calcifications as well as aortic valve leaflet calcifications. Correlate with aortic stenosis. Electronically Signed   By: Tish Frederickson M.D.   On: 05/18/2021 18:37   US Carotid Bilateral  Result Date: 05/18/2021 CLINICAL DATA:  Syncope and collapse. EXAM: BILATERAL CAROTID DUPLEX ULTRASOUND TECHNIQUE: Wallace Cullens scale imaging, color Doppler and duplex ultrasound were performed of bilateral carotid and vertebral arteries in the neck. COMPARISON:  None. FINDINGS: Criteria: Quantification of carotid stenosis is based on velocity parameters that correlate the residual  internal carotid diameter with NASCET-based stenosis levels, using the diameter of the distal internal carotid lumen as the denominator for stenosis measurement. The following velocity measurements were obtained: RIGHT ICA: 59 cm/sec CCA: 87 cm/sec SYSTOLIC ICA/CCA RATIO:  0.7 ECA: 67 cm/sec LEFT ICA: 76 cm/sec CCA: 69 cm/sec SYSTOLIC ICA/CCA RATIO:  1.1 ECA: 70 cm/sec RIGHT CAROTID ARTERY: Minimal atherosclerotic plaque in the right carotid bulb extending into the proximal ICA. There is atherosclerotic plaque in the external carotid artery. RIGHT VERTEBRAL ARTERY:  Patent with anteverted flow. LEFT CAROTID ARTERY: Mild plaque in the carotid bulb extending into  the proximal ICA. LEFT VERTEBRAL ARTERY: There appears to be somewhat retrograde flow in the left vertebral artery. The left subclavian artery appears patent with normal direction. Focal stenosis at the origin of the left subclavian artery is not excluded. CT angiography may provide better evaluation. Upper extremity blood pressures: RIGHT: 110/71 LEFT: 105/78 IMPRESSION: 1. Less than 50% ICA stenosis bilaterally. 2. Retrograde appearance of flow in the left vertebral artery suspicious for subclavian steal syndrome. CT angiography is recommended to evaluate for possible proximal subclavian artery stenosis. Electronically Signed   By: Elgie Collard M.D.   On: 05/18/2021 22:26   ECHOCARDIOGRAM COMPLETE  Result Date: 05/19/2021    ECHOCARDIOGRAM REPORT   Patient Name:   CHADRIC KIMBERLEY Date of Exam: 05/19/2021 Medical Rec #:  [id number]    Height:       68.0 in Accession #:    [id number]   Weight:       274.0 lb Date of Birth:  11/26/1950    BSA:          2.336 m Patient Age:    70 years     BP:           104/83 mmHg Patient Gender: M            HR:           117 bpm. Exam Location:  [facility] Procedure: 2D Echo, Color Doppler and Cardiac Doppler Indications:     R55 Syncope  History:         Patient has prior history of Echocardiogram examinations, most                  recent 08/30/2018. COPD and DVT; Risk Factors:Diabetes. Hx of                  liver cirrhosis.  Sonographer:     Humphrey Rolls Referring Phys:  Kenn File DOUTOVA Diagnosing Phys: Lorine Bears MD  Sonographer Comments: No parasternal window and no subcostal window. Image acquisition challenging due to patient body habitus and Image acquisition challenging due to COPD. IMPRESSIONS  1. Left ventricular ejection fraction, by estimation, is 45 to 50%. The left ventricle has mildly decreased function. Left ventricular endocardial border not optimally defined to evaluate regional wall motion. Left ventricular diastolic parameters are indeterminate.  2. Right  ventricular systolic function is mildly reduced. The right ventricular size is mildly enlarged. There is normal pulmonary artery systolic pressure.  3. Right atrial size was moderately dilated.  4. The mitral valve is normal in structure. No evidence of mitral valve regurgitation. No evidence of mitral stenosis.  5. The aortic valve is normal in structure. Aortic valve regurgitation is not visualized. Mild to moderate aortic valve sclerosis/calcification is present, without any evidence of aortic stenosis.  6. The inferior vena cava is normal in size with greater than 50% respiratory variability, suggesting right atrial  pressure of 3 mmHg.  7. Challenging image quality. FINDINGS  Left Ventricle: Left ventricular ejection fraction, by estimation, is 45 to 50%. The left ventricle has mildly decreased function. Left ventricular endocardial border not optimally defined to evaluate regional wall motion. The left ventricular internal cavity size was normal in size. There is no left ventricular hypertrophy. Left ventricular diastolic parameters are indeterminate. Right Ventricle: The right ventricular size is mildly enlarged. No increase in right ventricular wall thickness. Right ventricular systolic function is mildly reduced. There is normal pulmonary artery systolic pressure. The tricuspid regurgitant velocity  is 2.03 m/s, and with an assumed right atrial pressure of 5 mmHg, the estimated right ventricular systolic pressure is 21.5 mmHg. Left Atrium: Left atrial size was normal in size. Right Atrium: Right atrial size was moderately dilated. Pericardium: There is no evidence of pericardial effusion. Mitral Valve: The mitral valve is normal in structure. No evidence of mitral valve regurgitation. No evidence of mitral valve stenosis. MV peak gradient, 4.0 mmHg. The mean mitral valve gradient is 2.0 mmHg. Tricuspid Valve: The tricuspid valve is normal in structure. Tricuspid valve regurgitation is trivial. No evidence of  tricuspid stenosis. Aortic Valve: The aortic valve is normal in structure. Aortic valve regurgitation is not visualized. Mild to moderate aortic valve sclerosis/calcification is present, without any evidence of aortic stenosis. Aortic valve mean gradient measures 6.0 mmHg. Aortic valve peak gradient measures 10.9 mmHg. Aortic valve area, by VTI measures 2.46 cm. Pulmonic Valve: The pulmonic valve was normal in structure. Pulmonic valve regurgitation is not visualized. No evidence of pulmonic stenosis. Aorta: The aortic root is normal in size and structure. Venous: The inferior vena cava is normal in size with greater than 50% respiratory variability, suggesting right atrial pressure of 3 mmHg. IAS/Shunts: No atrial level shunt detected by color flow Doppler.  LEFT VENTRICLE PLAX 2D LVOT diam:     2.40 cm      Diastology LV SV:         57           LV e' medial:    8.49 cm/s LV SV Index:   24           LV E/e' medial:  10.8 LVOT Area:     4.52 cm     LV e' lateral:   9.57 cm/s                             LV E/e' lateral: 9.6  LV Volumes (MOD) LV vol d, MOD A2C: 100.0 ml LV vol d, MOD A4C: 77.0 ml LV vol s, MOD A2C: 48.6 ml LV vol s, MOD A4C: 38.8 ml LV SV MOD A2C:     51.4 ml LV SV MOD A4C:     77.0 ml LV SV MOD BP:      45.4 ml RIGHT VENTRICLE RV Basal diam:  4.18 cm RV Mid diam:    4.87 cm RV S prime:     12.60 cm/s LEFT ATRIUM             Index       RIGHT ATRIUM           Index LA Vol (A2C):   32.8 ml 14.04 ml/m RA Area:     30.60 cm LA Vol (A4C):   35.7 ml 15.28 ml/m RA Volume:   110.00 ml 47.08 ml/m LA Biplane Vol: 35.8 ml 15.32 ml/m  AORTIC VALVE AV  Area (Vmax):    2.18 cm AV Area (Vmean):   2.13 cm AV Area (VTI):     2.46 cm AV Vmax:           165.00 cm/s AV Vmean:          112.000 cm/s AV VTI:            0.230 m AV Peak Grad:      10.9 mmHg AV Mean Grad:      6.0 mmHg LVOT Vmax:         79.60 cm/s LVOT Vmean:        52.700 cm/s LVOT VTI:          0.125 m LVOT/AV VTI ratio: 0.54  AORTA Ao Root diam:  2.60 cm MITRAL VALVE               TRICUSPID VALVE MV Area (PHT): 5.20 cm    TR Peak grad:   16.5 mmHg MV Area VTI:   4.63 cm    TR Vmax:        203.00 cm/s MV Peak grad:  4.0 mmHg MV Mean grad:  2.0 mmHg    SHUNTS MV Vmax:       1.00 m/s    Systemic VTI:  0.12 m MV Vmean:      74.7 cm/s   Systemic Diam: 2.40 cm MV Decel Time: 146 msec MV E velocity: 91.43 cm/s Lorine BearsMuhammad Arida MD Electronically signed by Lorine BearsMuhammad Arida MD Signature Date/Time: 05/19/2021/4:35:30 PM    Final    US ASCITES (ABDOMEN LIMITED)  Result Date: 05/19/2021 CLINICAL DATA:  Ascites EXAM: LIMITED ABDOMEN ULTRASOUND FOR ASCITES TECHNIQUE: Limited ultrasound survey for ascites was performed in all four abdominal quadrants. COMPARISON:  CT 05/18/2021 FINDINGS: There is moderate volume abdominal ascites seen in the left upper and lower quadrants. No ascites seen in the right upper or right lower quadrants. IMPRESSION: Moderate volume ascites seen in the left upper and lower quadrants. Electronically Signed   By: Caprice RenshawJacob  Kahn M.D.   On: 05/19/2021 14:37    Cardiac Studies   TTE 05/20/2021 1. Left ventricular ejection fraction, by estimation, is 45 to 50%. The  left ventricle has mildly decreased function. Left ventricular endocardial  border not optimally defined to evaluate regional wall motion. Left  ventricular diastolic parameters are  indeterminate.   2. Right ventricular systolic function is mildly reduced. The right  ventricular size is mildly enlarged. There is normal pulmonary artery  systolic pressure.   3. Right atrial size was moderately dilated.   4. The mitral valve is normal in structure. No evidence of mitral valve  regurgitation. No evidence of mitral stenosis.   5. The aortic valve is normal in structure. Aortic valve regurgitation is  not visualized. Mild to moderate aortic valve sclerosis/calcification is  present, without any evidence of aortic stenosis.   6. The inferior vena cava is normal in size with greater  than 50%  respiratory variability, suggesting right atrial pressure of 3 mmHg.   7. Challenging image quality.   Patient Profile     70 y.o. male with history of atrial fibs/atrial flutter, alcohol induced cirrhosis, presenting with frequent falls, found to be in atrial fibrillation with rapid ventricular response  Assessment & Plan    Atrial flutter with rapid ventricular response -Heart rate improved but still elevated -Continue Lopressor, digoxin -Continue therapeutic Eliquis (bleeding risk is high in light of low platelets, frequent falls) -Rates may be difficult to control.  Consider DC cardioversion as outpatient.  2.  Mildly reduced ejection fraction, EF 45 to 50% -Likely tachycardia mediated -Management of a flutter as above -Consider outpatient DC cardioversion  3.  Cirrhosis, abdominal distention, hypertension, frequent falls -Agree with midodrine for BP support -Okay for Lasix to help with distention. -Consider increasing midodrine with adding Aldactone for BP support  4.  EtOH use -Cessation advised  Total encounter time 35 minutes  Greater than 50% was spent in counseling and coordination of care with the patient     Signed, Debbe Odea, MD  05/20/2021, 2:15 PM

## 2021-05-20 NOTE — Progress Notes (Addendum)
PROGRESS NOTE    Lawrence Osborn  IZT:[id number] DOB: [birth year] DOA: 05/18/2021 PCP: Danella Penton, MD    Brief Narrative:  Lawrence Osborn is a 70 y.o. male with medical history significant of Alcoholic cirrhosis of liver with ascites, Hepatic encephalopathy syndrome, hypothyrodism, bilateral PE, COPD, Dm2, A.fib, HLD, SLE,   peripheral neuropathy, CAD     Presented with   syncope lasting 30 seconds he was getting out of his car and fell out and hit concrete.  Hit the front of his head. Did not want to come with EMS initially.  He finally did agree to come because he was still feeling somewhat unsteady chronic diarrhea has been using lactulose Notes peripheral edema Now drinks 2 bears a day Denies any fevers or chills no cough nausea vomiting  reports rush for 1 week on his abdomen      Does not smoke Drinks 2 beers a day Reports his abdomen has gotten smoller His bp runs in low 100 and Hr in 120's   Patient reports although he takes regular lactulose his stools have been usually well formed for the past 3 days he been having very runny frequent diarrhea with makes him up from night with a foul smell.  Denies biotic use.  No change in lactulose dosing that he is aware of he actually stopped taking his lactulose today because of frequent bowel movement  9/2- HR on monitor in 110's denies sob or cp.  9/3- pt appears mildly annoyed by my presence as he was on the phone. Denies sob, dizziness, abd pain.   Consultants:  GI  Procedures:   Antimicrobials:      Subjective: No cp or sob.   Objective: Vitals:   05/19/21 2100 05/20/21 0000 05/20/21 0500 05/20/21 0734  BP:  106/65 117/67 112/74  Pulse:   (!) 117 (!) 118  Resp:  20 20 18   Temp:  97.6 F (36.4 C) 98.2 F (36.8 C) 97.9 F (36.6 C)  TempSrc:  Oral Oral   SpO2:  95% 97% 98%  Weight: 118.1 kg     Height: 5\' 8"  (1.727 m)       Intake/Output Summary (Last 24 hours) at 05/20/2021 0813 Last data filed at 05/20/2021  0700 Gross per 24 hour  Intake 240 ml  Output --  Net 240 ml   Filed Weights   05/18/21 1538 05/19/21 2100  Weight: 124.3 kg 118.1 kg    Examination: Nad, on the phone Decrease bs , no wheezing Irregular , s1/s2 no gallop Soft , distended, +bs +edema b/l Aaxoxo3 grossly intact   Data Reviewed: I have personally reviewed following labs and imaging studies  CBC: Recent Labs  Lab 05/18/21 1602 05/19/21 0410  WBC 5.5 4.2  NEUTROABS  --  2.6  HGB 15.2 12.9*  HCT 42.1 36.4*  MCV 100.7* 102.5*  PLT 94* 68*   Basic Metabolic Panel: Recent Labs  Lab 05/18/21 1602 05/18/21 1759 05/19/21 0410  NA 133*  --  135  K 4.4  --  3.9  CL 95*  --  100  CO2 31  --  30  GLUCOSE 116*  --  85  BUN 8  --  8  CREATININE 0.82  --  0.71  CALCIUM 8.8*  --  8.1*  MG 1.8  --  1.5*  PHOS  --  4.0 4.3   GFR: Estimated Creatinine Clearance: 110.4 mL/min (by C-G formula based on SCr of 0.71 mg/dL). Liver Function Tests: Recent Labs  Lab 05/18/21 1602 05/19/21 0410  AST 50* 39  ALT 20 15  ALKPHOS 141* 106  BILITOT 3.5* 2.6*  PROT 6.8 5.8*  ALBUMIN 2.5* 2.4*   No results for input(s): LIPASE, AMYLASE in the last 168 hours. Recent Labs  Lab 05/18/21 1602 05/19/21 0409  AMMONIA 97* 77*   Coagulation Profile: Recent Labs  Lab 05/18/21 1602  INR 1.7*   Cardiac Enzymes: Recent Labs  Lab 05/18/21 1759  CKTOTAL 46*   BNP (last 3 results) No results for input(s): PROBNP in the last 8760 hours. HbA1C: Recent Labs    05/18/21 2123  HGBA1C 5.1   CBG: Recent Labs  Lab 05/19/21 1606 05/19/21 2107 05/20/21 0026 05/20/21 0504 05/20/21 0733  GLUCAP 109* 95 136* 137* 105*   Lipid Profile: No results for input(s): CHOL, HDL, LDLCALC, TRIG, CHOLHDL, LDLDIRECT in the last 72 hours. Thyroid Function Tests: Recent Labs    05/19/21 0410  TSH 4.994*   Anemia Panel: No results for input(s): VITAMINB12, FOLATE, FERRITIN, TIBC, IRON, RETICCTPCT in the last 72  hours. Sepsis Labs: Recent Labs  Lab 05/18/21 1602 05/18/21 1759 05/18/21 2123 05/19/21 0003  LATICACIDVEN 2.6* 2.8* 2.8* 2.1*    Recent Results (from the past 240 hour(s))  Blood culture (routine single)     Status: None (Preliminary result)   Collection Time: 05/18/21  4:02 PM   Specimen: BLOOD  Result Value Ref Range Status   Specimen Description BLOOD BLOOD RIGHT FOREARM  Final   Special Requests   Final    BOTTLES DRAWN AEROBIC AND ANAEROBIC Blood Culture adequate volume   Culture   Final    NO GROWTH 2 DAYS Performed at [facility], [address], [place], [place] [address]    Report Status PENDING  Incomplete  Resp Panel by RT-PCR (Flu A&B, Covid) Nasopharyngeal Swab     Status: None   Collection Time: 05/18/21  4:09 PM   Specimen: Nasopharyngeal Swab; Nasopharyngeal(NP) swabs in vial transport medium  Result Value Ref Range Status   SARS Coronavirus 2 by RT PCR NEGATIVE NEGATIVE Final    Comment: (NOTE) SARS-CoV-2 target nucleic acids are NOT DETECTED.  The SARS-CoV-2 RNA is generally detectable in upper respiratory specimens during the acute phase of infection. The lowest concentration of SARS-CoV-2 viral copies this assay can detect is 138 copies/mL. A negative result does not preclude SARS-Cov-2 infection and should not be used as the sole basis for treatment or other patient management decisions. A negative result may occur with  improper specimen collection/handling, submission of specimen other than nasopharyngeal swab, presence of viral mutation(s) within the areas targeted by this assay, and inadequate number of viral copies(<138 copies/mL). A negative result must be combined with clinical observations, patient history, and epidemiological information. The expected result is Negative.  Fact Sheet for Patients:  BloggerCourse.com  Fact Sheet for Healthcare Providers:  SeriousBroker.it  This  test is no t yet approved or cleared by the [place] FDA and  has been authorized for detection and/or diagnosis of SARS-CoV-2 by FDA under an Emergency Use Authorization (EUA). This EUA will remain  in effect (meaning this test can be used) for the duration of the COVID-19 declaration under Section 564(b)(1) of the Act, 21 U.S.C.section 360bbb-3(b)(1), unless the authorization is terminated  or revoked sooner.       Influenza A by PCR NEGATIVE NEGATIVE Final   Influenza B by PCR NEGATIVE NEGATIVE Final    Comment: (NOTE) The Xpert Xpress SARS-CoV-2/FLU/RSV plus assay  is intended as an aid in the diagnosis of influenza from Nasopharyngeal swab specimens and should not be used as a sole basis for treatment. Nasal washings and aspirates are unacceptable for Xpert Xpress SARS-CoV-2/FLU/RSV testing.  Fact Sheet for Patients: BloggerCourse.com  Fact Sheet for Healthcare Providers: SeriousBroker.it  This test is not yet approved or cleared by the [place] FDA and has been authorized for detection and/or diagnosis of SARS-CoV-2 by FDA under an Emergency Use Authorization (EUA). This EUA will remain in effect (meaning this test can be used) for the duration of the COVID-19 declaration under Section 564(b)(1) of the Act, 21 U.S.C. section 360bbb-3(b)(1), unless the authorization is terminated or revoked.  Performed at [facility], [address] [place], [place] [address]          Radiology Studies: DG Chest 2 View  Result Date: 05/18/2021 CLINICAL DATA:  Syncope. EXAM: CHEST - 2 VIEW COMPARISON:  April 11, 2021. FINDINGS: The heart size and mediastinal contours are within normal limits. No pneumothorax is noted. Right lung is clear. Small left pleural effusion is noted with adjacent left basilar atelectasis. The visualized skeletal structures are unremarkable. IMPRESSION: Small left pleural effusion is noted  with adjacent left basilar atelectasis. Aortic Atherosclerosis (ICD10-I70.0). Electronically Signed   By: Lupita Raider M.D.   On: 05/18/2021 16:55   CT HEAD WO CONTRAST  Result Date: 05/18/2021 CLINICAL DATA:  Loss of consciousness after fall. Status post fall. Nonlocalized acute abdominal pain. EXAM: CT HEAD WITHOUT CONTRAST CT CERVICAL SPINE WITHOUT CONTRAST CT CHEST, ABDOMEN AND PELVIS WITH CONTRAST TECHNIQUE: Contiguous axial images were obtained from the base of the skull through the vertex without intravenous contrast. Multidetector CT imaging of the cervical spine was performed without intravenous contrast. Multiplanar CT image reconstructions were also generated. Multidetector CT imaging of the chest, abdomen and pelvis was performed following the standard protocol during bolus administration of intravenous contrast. CONTRAST:  OMNIPAQUE IOHEXOL 350 MG/ML SOLN COMPARISON:  None. FINDINGS: CT HEAD FINDINGS BRAIN: BRAIN Cerebral ventricle sizes are concordant with the degree of cerebral volume loss. Patchy and confluent areas of decreased attenuation are noted throughout the deep and periventricular white matter of the cerebral hemispheres bilaterally, compatible with chronic microvascular ischemic disease. No evidence of large-territorial acute infarction. No parenchymal hemorrhage. No mass lesion. No extra-axial collection. No mass effect or midline shift. No hydrocephalus. Basilar cisterns are patent. Vascular: No hyperdense vessel. Atherosclerotic calcifications are present within the cavernous internal carotid arteries. Skull: No acute fracture or focal lesion. Sinuses/Orbits: Paranasal sinuses and mastoid air cells are clear. The orbits are unremarkable. Other: None. CT CERVICAL FINDINGS Alignment: Straightening of the normal cervical lordosis likely due to positioning and degenerative changes. Alignment is otherwise normal. Skull base and vertebrae: Moderate to severe multilevel degenerative  changes of the spine. Associated multilevel severe osseous neural foraminal stenosis. No severe osseous central canal stenosis. No acute fracture. No aggressive appearing focal osseous lesion or focal pathologic process. Soft tissues and spinal canal: No prevertebral fluid or swelling. No visible canal hematoma. Upper chest: At least moderate volume left pleural effusion. No pneumothorax. Other: None. CHEST: Ports and Devices: None. Lungs/airways: Nonspecific thin walled cystic change of the right apex. Couple scattered pulmonary micronodules. Passive atelectasis of the left lower lobe. Passive atelectasis of the lingula. No focal consolidation. No pulmonary mass. No pulmonary contusion or laceration. No pneumatocele formation. The central airways are patent. Pleura: Small to moderate volume left pleural effusion no right pleural effusion. No  pneumothorax. No hemothorax. Lymph Nodes: No mediastinal, hilar, or axillary lymphadenopathy. Mediastinum: No pneumomediastinum. No aortic injury or mediastinal hematoma. The thoracic aorta is normal in caliber. Severe calcified and noncalcified atherosclerotic plaque of the thoracic aorta. At least three-vessel coronary calcifications. Mild aortic valve leaflet calcifications. The heart is normal in size. No significant pericardial effusion. The main pulmonary artery is normal in caliber. No pulmonary embolus. The esophagus is unremarkable. The thyroid is unremarkable. Chest Wall / Breasts: No chest wall mass. Musculoskeletal: No acute rib or sternal fracture. No spinal fracture. ABDOMEN / PELVIS: Liver: Not enlarged. Nodular hepatic contour. No focal lesion. No laceration or subcapsular hematoma. Biliary System: Layering hyperdensity within gallbladder lumen consistent with radio-opaque gallstones. No biliary ductal dilatation. Pancreas: Normal pancreatic contour. No main pancreatic duct dilatation. Spleen: Enlarged in caliber measuring up to at least 18 cm. No focal lesion.  No laceration, subcapsular hematoma, or vascular injury. Adrenal Glands: No nodularity bilaterally. Kidneys: Bilateral kidneys enhance symmetrically. No hydronephrosis. No contusion, laceration, or subcapsular hematoma. Exophytic 1.7 cm fluid density lesion within the right kidney likely represents a simple renal cyst. Similar finding on the left measuring up to 1.8 cm. No injury to the vascular structures or collecting systems. No hydroureter. The urinary bladder is unremarkable. Bowel: No small or large bowel wall thickening or dilatation. The appendix is not definitely identified. Mesentery, Omentum, and Peritoneum: Small to moderate volume simple free fluid ascites. No pneumoperitoneum. No hemoperitoneum. No mesenteric hematoma identified. No organized fluid collection. Pelvic Organs: Normal. Lymph Nodes: No abdominal, pelvic, inguinal lymphadenopathy. Vasculature: Recanalized paraumbilical vein. Upper abdominal venous collaterals. Small esophageal varices. The main portal, splenic, superior mesenteric veins are patent. No abdominal aorta or iliac aneurysm. No active contrast extravasation or pseudoaneurysm. Musculoskeletal: No significant soft tissue hematoma. No acute pelvic fracture. No spinal fracture. IMPRESSION: 1. No acute intracranial abnormality. 2. 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 3.  No acute traumatic injury to the chest, abdomen, or pelvis. 4. No acute fracture or traumatic malalignment of the thoracic or lumbar spine. Other imaging findings of potential clinical significance: 1. No pulmonary embolus. 2. Small to moderate volume left pleural effusion. 3. Cirrhosis with portal hypertension. No focal hepatic lesion on this single-phase portal venous study. Recommend nonemergent MRI liver protocol for further evaluation. When the patient is clinically stable and  able to follow directions and hold their breath (preferably as an outpatient) further evaluation with dedicated abdominal MRI should be considered. 4. Cholelithiasis no findings of acute cholecystitis. 5. Aortic Atherosclerosis (ICD10-I70.0). Including at least 3 vessel coronary artery calcifications as well as aortic valve leaflet calcifications. Correlate with aortic stenosis. Electronically Signed   By: Tish Frederickson M.D.   On: 05/18/2021 18:37   CT Angio Chest PE W and/or Wo Contrast  Result Date: 05/18/2021 CLINICAL DATA:  Loss of consciousness after fall. Status post fall. Nonlocalized acute abdominal pain. EXAM: CT HEAD WITHOUT CONTRAST CT CERVICAL SPINE WITHOUT CONTRAST CT CHEST, ABDOMEN AND PELVIS WITH CONTRAST TECHNIQUE: Contiguous axial images were obtained from the base of the skull through the vertex without intravenous contrast. Multidetector CT imaging of the cervical spine was performed without intravenous contrast. Multiplanar CT image reconstructions were also generated. Multidetector CT imaging of the chest, abdomen and pelvis was performed following the standard protocol during bolus administration of intravenous contrast. CONTRAST:  OMNIPAQUE IOHEXOL 350 MG/ML SOLN COMPARISON:  None. FINDINGS:  CT HEAD FINDINGS BRAIN: BRAIN Cerebral ventricle sizes are concordant with the degree of cerebral volume loss. Patchy and confluent areas of decreased attenuation are noted throughout the deep and periventricular white matter of the cerebral hemispheres bilaterally, compatible with chronic microvascular ischemic disease. No evidence of large-territorial acute infarction. No parenchymal hemorrhage. No mass lesion. No extra-axial collection. No mass effect or midline shift. No hydrocephalus. Basilar cisterns are patent. Vascular: No hyperdense vessel. Atherosclerotic calcifications are present within the cavernous internal carotid arteries. Skull: No acute fracture or focal lesion. Sinuses/Orbits:  Paranasal sinuses and mastoid air cells are clear. The orbits are unremarkable. Other: None. CT CERVICAL FINDINGS Alignment: Straightening of the normal cervical lordosis likely due to positioning and degenerative changes. Alignment is otherwise normal. Skull base and vertebrae: Moderate to severe multilevel degenerative changes of the spine. Associated multilevel severe osseous neural foraminal stenosis. No severe osseous central canal stenosis. No acute fracture. No aggressive appearing focal osseous lesion or focal pathologic process. Soft tissues and spinal canal: No prevertebral fluid or swelling. No visible canal hematoma. Upper chest: At least moderate volume left pleural effusion. No pneumothorax. Other: None. CHEST: Ports and Devices: None. Lungs/airways: Nonspecific thin walled cystic change of the right apex. Couple scattered pulmonary micronodules. Passive atelectasis of the left lower lobe. Passive atelectasis of the lingula. No focal consolidation. No pulmonary mass. No pulmonary contusion or laceration. No pneumatocele formation. The central airways are patent. Pleura: Small to moderate volume left pleural effusion no right pleural effusion. No pneumothorax. No hemothorax. Lymph Nodes: No mediastinal, hilar, or axillary lymphadenopathy. Mediastinum: No pneumomediastinum. No aortic injury or mediastinal hematoma. The thoracic aorta is normal in caliber. Severe calcified and noncalcified atherosclerotic plaque of the thoracic aorta. At least three-vessel coronary calcifications. Mild aortic valve leaflet calcifications. The heart is normal in size. No significant pericardial effusion. The main pulmonary artery is normal in caliber. No pulmonary embolus. The esophagus is unremarkable. The thyroid is unremarkable. Chest Wall / Breasts: No chest wall mass. Musculoskeletal: No acute rib or sternal fracture. No spinal fracture. ABDOMEN / PELVIS: Liver: Not enlarged. Nodular hepatic contour. No focal lesion.  No laceration or subcapsular hematoma. Biliary System: Layering hyperdensity within gallbladder lumen consistent with radio-opaque gallstones. No biliary ductal dilatation. Pancreas: Normal pancreatic contour. No main pancreatic duct dilatation. Spleen: Enlarged in caliber measuring up to at least 18 cm. No focal lesion. No laceration, subcapsular hematoma, or vascular injury. Adrenal Glands: No nodularity bilaterally. Kidneys: Bilateral kidneys enhance symmetrically. No hydronephrosis. No contusion, laceration, or subcapsular hematoma. Exophytic 1.7 cm fluid density lesion within the right kidney likely represents a simple renal cyst. Similar finding on the left measuring up to 1.8 cm. No injury to the vascular structures or collecting systems. No hydroureter. The urinary bladder is unremarkable. Bowel: No small or large bowel wall thickening or dilatation. The appendix is not definitely identified. Mesentery, Omentum, and Peritoneum: Small to moderate volume simple free fluid ascites. No pneumoperitoneum. No hemoperitoneum. No mesenteric hematoma identified. No organized fluid collection. Pelvic Organs: Normal. Lymph Nodes: No abdominal, pelvic, inguinal lymphadenopathy. Vasculature: Recanalized paraumbilical vein. Upper abdominal venous collaterals. Small esophageal varices. The main portal, splenic, superior mesenteric veins are patent. No abdominal aorta or iliac aneurysm. No active contrast extravasation or pseudoaneurysm. Musculoskeletal: No significant soft tissue hematoma. No acute pelvic fracture. No spinal fracture. IMPRESSION: 1. No acute intracranial abnormality. 2. No acute displaced fracture or traumatic listhesis of the cervical spine in a patient with multilevel severe osseous neural foraminal stenosis due to  degenerative changes. 3.  No acute traumatic injury to the chest, abdomen, or pelvis. 4. No acute fracture or traumatic malalignment of the thoracic or lumbar spine. Other imaging findings of  potential clinical significance: 1. No pulmonary embolus. 2. Small to moderate volume left pleural effusion. 3. Cirrhosis with portal hypertension. No focal hepatic lesion on this single-phase portal venous study. Recommend nonemergent MRI liver protocol for further evaluation. When the patient is clinically stable and able to follow directions and hold their breath (preferably as an outpatient) further evaluation with dedicated abdominal MRI should be considered. 4. Cholelithiasis no findings of acute cholecystitis. 5. Aortic Atherosclerosis (ICD10-I70.0). Including at least 3 vessel coronary artery calcifications as well as aortic valve leaflet calcifications. Correlate with aortic stenosis. Electronically Signed   By: Tish FredericksonMorgane  Naveau M.D.   On: 05/18/2021 18:37   CT Cervical Spine Wo Contrast  Result Date: 05/18/2021 CLINICAL DATA:  Loss of consciousness after fall. Status post fall. Nonlocalized acute abdominal pain. EXAM: CT HEAD WITHOUT CONTRAST CT CERVICAL SPINE WITHOUT CONTRAST CT CHEST, ABDOMEN AND PELVIS WITH CONTRAST TECHNIQUE: Contiguous axial images were obtained from the base of the skull through the vertex without intravenous contrast. Multidetector CT imaging of the cervical spine was performed without intravenous contrast. Multiplanar CT image reconstructions were also generated. Multidetector CT imaging of the chest, abdomen and pelvis was performed following the standard protocol during bolus administration of intravenous contrast. CONTRAST:  100mL OMNIPAQUE IOHEXOL 350 MG/ML SOLN COMPARISON:  None. FINDINGS: CT HEAD FINDINGS BRAIN: BRAIN Cerebral ventricle sizes are concordant with the degree of cerebral volume loss. Patchy and confluent areas of decreased attenuation are noted throughout the deep and periventricular white matter of the cerebral hemispheres bilaterally, compatible with chronic microvascular ischemic disease. No evidence of large-territorial acute infarction. No parenchymal  hemorrhage. No mass lesion. No extra-axial collection. No mass effect or midline shift. No hydrocephalus. Basilar cisterns are patent. Vascular: No hyperdense vessel. Atherosclerotic calcifications are present within the cavernous internal carotid arteries. Skull: No acute fracture or focal lesion. Sinuses/Orbits: Paranasal sinuses and mastoid air cells are clear. The orbits are unremarkable. Other: None. CT CERVICAL FINDINGS Alignment: Straightening of the normal cervical lordosis likely due to positioning and degenerative changes. Alignment is otherwise normal. Skull base and vertebrae: Moderate to severe multilevel degenerative changes of the spine. Associated multilevel severe osseous neural foraminal stenosis. No severe osseous central canal stenosis. No acute fracture. No aggressive appearing focal osseous lesion or focal pathologic process. Soft tissues and spinal canal: No prevertebral fluid or swelling. No visible canal hematoma. Upper chest: At least moderate volume left pleural effusion. No pneumothorax. Other: None. CHEST: Ports and Devices: None. Lungs/airways: Nonspecific thin walled cystic change of the right apex. Couple scattered pulmonary micronodules. Passive atelectasis of the left lower lobe. Passive atelectasis of the lingula. No focal consolidation. No pulmonary mass. No pulmonary contusion or laceration. No pneumatocele formation. The central airways are patent. Pleura: Small to moderate volume left pleural effusion no right pleural effusion. No pneumothorax. No hemothorax. Lymph Nodes: No mediastinal, hilar, or axillary lymphadenopathy. Mediastinum: No pneumomediastinum. No aortic injury or mediastinal hematoma. The thoracic aorta is normal in caliber. Severe calcified and noncalcified atherosclerotic plaque of the thoracic aorta. At least three-vessel coronary calcifications. Mild aortic valve leaflet calcifications. The heart is normal in size. No significant pericardial effusion. The main  pulmonary artery is normal in caliber. No pulmonary embolus. The esophagus is unremarkable. The thyroid is unremarkable. Chest Wall / Breasts: No chest wall mass. Musculoskeletal: No  acute rib or sternal fracture. No spinal fracture. ABDOMEN / PELVIS: Liver: Not enlarged. Nodular hepatic contour. No focal lesion. No laceration or subcapsular hematoma. Biliary System: Layering hyperdensity within gallbladder lumen consistent with radio-opaque gallstones. No biliary ductal dilatation. Pancreas: Normal pancreatic contour. No main pancreatic duct dilatation. Spleen: Enlarged in caliber measuring up to at least 18 cm. No focal lesion. No laceration, subcapsular hematoma, or vascular injury. Adrenal Glands: No nodularity bilaterally. Kidneys: Bilateral kidneys enhance symmetrically. No hydronephrosis. No contusion, laceration, or subcapsular hematoma. Exophytic 1.7 cm fluid density lesion within the right kidney likely represents a simple renal cyst. Similar finding on the left measuring up to 1.8 cm. No injury to the vascular structures or collecting systems. No hydroureter. The urinary bladder is unremarkable. Bowel: No small or large bowel wall thickening or dilatation. The appendix is not definitely identified. Mesentery, Omentum, and Peritoneum: Small to moderate volume simple free fluid ascites. No pneumoperitoneum. No hemoperitoneum. No mesenteric hematoma identified. No organized fluid collection. Pelvic Organs: Normal. Lymph Nodes: No abdominal, pelvic, inguinal lymphadenopathy. Vasculature: Recanalized paraumbilical vein. Upper abdominal venous collaterals. Small esophageal varices. The main portal, splenic, superior mesenteric veins are patent. No abdominal aorta or iliac aneurysm. No active contrast extravasation or pseudoaneurysm. Musculoskeletal: No significant soft tissue hematoma. No acute pelvic fracture. No spinal fracture. IMPRESSION: 1. No acute intracranial abnormality. 2. No acute displaced fracture  or traumatic listhesis of the cervical spine in a patient with multilevel severe osseous neural foraminal stenosis due to degenerative changes. 3.  No acute traumatic injury to the chest, abdomen, or pelvis. 4. No acute fracture or traumatic malalignment of the thoracic or lumbar spine. Other imaging findings of potential clinical significance: 1. No pulmonary embolus. 2. Small to moderate volume left pleural effusion. 3. Cirrhosis with portal hypertension. No focal hepatic lesion on this single-phase portal venous study. Recommend nonemergent MRI liver protocol for further evaluation. When the patient is clinically stable and able to follow directions and hold their breath (preferably as an outpatient) further evaluation with dedicated abdominal MRI should be considered. 4. Cholelithiasis no findings of acute cholecystitis. 5. Aortic Atherosclerosis (ICD10-I70.0). Including at least 3 vessel coronary artery calcifications as well as aortic valve leaflet calcifications. Correlate with aortic stenosis. Electronically Signed   By: Tish Frederickson M.D.   On: 05/18/2021 18:37   CT ABDOMEN PELVIS W CONTRAST  Result Date: 05/18/2021 CLINICAL DATA:  Loss of consciousness after fall. Status post fall. Nonlocalized acute abdominal pain. EXAM: CT HEAD WITHOUT CONTRAST CT CERVICAL SPINE WITHOUT CONTRAST CT CHEST, ABDOMEN AND PELVIS WITH CONTRAST TECHNIQUE: Contiguous axial images were obtained from the base of the skull through the vertex without intravenous contrast. Multidetector CT imaging of the cervical spine was performed without intravenous contrast. Multiplanar CT image reconstructions were also generated. Multidetector CT imaging of the chest, abdomen and pelvis was performed following the standard protocol during bolus administration of intravenous contrast. CONTRAST:  OMNIPAQUE IOHEXOL 350 MG/ML SOLN COMPARISON:  None. FINDINGS: CT HEAD FINDINGS BRAIN: BRAIN Cerebral ventricle sizes are concordant with the  degree of cerebral volume loss. Patchy and confluent areas of decreased attenuation are noted throughout the deep and periventricular white matter of the cerebral hemispheres bilaterally, compatible with chronic microvascular ischemic disease. No evidence of large-territorial acute infarction. No parenchymal hemorrhage. No mass lesion. No extra-axial collection. No mass effect or midline shift. No hydrocephalus. Basilar cisterns are patent. Vascular: No hyperdense vessel. Atherosclerotic calcifications are present within the cavernous internal carotid arteries. Skull: No acute fracture  or focal lesion. Sinuses/Orbits: Paranasal sinuses and mastoid air cells are clear. The orbits are unremarkable. Other: None. CT CERVICAL FINDINGS Alignment: Straightening of the normal cervical lordosis likely due to positioning and degenerative changes. Alignment is otherwise normal. Skull base and vertebrae: Moderate to severe multilevel degenerative changes of the spine. Associated multilevel severe osseous neural foraminal stenosis. No severe osseous central canal stenosis. No acute fracture. No aggressive appearing focal osseous lesion or focal pathologic process. Soft tissues and spinal canal: No prevertebral fluid or swelling. No visible canal hematoma. Upper chest: At least moderate volume left pleural effusion. No pneumothorax. Other: None. CHEST: Ports and Devices: None. Lungs/airways: Nonspecific thin walled cystic change of the right apex. Couple scattered pulmonary micronodules. Passive atelectasis of the left lower lobe. Passive atelectasis of the lingula. No focal consolidation. No pulmonary mass. No pulmonary contusion or laceration. No pneumatocele formation. The central airways are patent. Pleura: Small to moderate volume left pleural effusion no right pleural effusion. No pneumothorax. No hemothorax. Lymph Nodes: No mediastinal, hilar, or axillary lymphadenopathy. Mediastinum: No pneumomediastinum. No aortic injury  or mediastinal hematoma. The thoracic aorta is normal in caliber. Severe calcified and noncalcified atherosclerotic plaque of the thoracic aorta. At least three-vessel coronary calcifications. Mild aortic valve leaflet calcifications. The heart is normal in size. No significant pericardial effusion. The main pulmonary artery is normal in caliber. No pulmonary embolus. The esophagus is unremarkable. The thyroid is unremarkable. Chest Wall / Breasts: No chest wall mass. Musculoskeletal: No acute rib or sternal fracture. No spinal fracture. ABDOMEN / PELVIS: Liver: Not enlarged. Nodular hepatic contour. No focal lesion. No laceration or subcapsular hematoma. Biliary System: Layering hyperdensity within gallbladder lumen consistent with radio-opaque gallstones. No biliary ductal dilatation. Pancreas: Normal pancreatic contour. No main pancreatic duct dilatation. Spleen: Enlarged in caliber measuring up to at least 18 cm. No focal lesion. No laceration, subcapsular hematoma, or vascular injury. Adrenal Glands: No nodularity bilaterally. Kidneys: Bilateral kidneys enhance symmetrically. No hydronephrosis. No contusion, laceration, or subcapsular hematoma. Exophytic 1.7 cm fluid density lesion within the right kidney likely represents a simple renal cyst. Similar finding on the left measuring up to 1.8 cm. No injury to the vascular structures or collecting systems. No hydroureter. The urinary bladder is unremarkable. Bowel: No small or large bowel wall thickening or dilatation. The appendix is not definitely identified. Mesentery, Omentum, and Peritoneum: Small to moderate volume simple free fluid ascites. No pneumoperitoneum. No hemoperitoneum. No mesenteric hematoma identified. No organized fluid collection. Pelvic Organs: Normal. Lymph Nodes: No abdominal, pelvic, inguinal lymphadenopathy. Vasculature: Recanalized paraumbilical vein. Upper abdominal venous collaterals. Small esophageal varices. The main portal, splenic,  superior mesenteric veins are patent. No abdominal aorta or iliac aneurysm. No active contrast extravasation or pseudoaneurysm. Musculoskeletal: No significant soft tissue hematoma. No acute pelvic fracture. No spinal fracture. IMPRESSION: 1. No acute intracranial abnormality. 2. 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 3.  No acute traumatic injury to the chest, abdomen, or pelvis. 4. No acute fracture or traumatic malalignment of the thoracic or lumbar spine. Other imaging findings of potential clinical significance: 1. No pulmonary embolus. 2. Small to moderate volume left pleural effusion. 3. Cirrhosis with portal hypertension. No focal hepatic lesion on this single-phase portal venous study. Recommend nonemergent MRI liver protocol for further evaluation. When the patient is clinically stable and able to follow directions and hold their breath (preferably as an outpatient) further  evaluation with dedicated abdominal MRI should be considered. 4. Cholelithiasis no findings of acute cholecystitis. 5. Aortic Atherosclerosis (ICD10-I70.0). Including at least 3 vessel coronary artery calcifications as well as aortic valve leaflet calcifications. Correlate with aortic stenosis. Electronically Signed   By: Tish Frederickson M.D.   On: 05/18/2021 18:37   US Carotid Bilateral  Result Date: 05/18/2021 CLINICAL DATA:  Syncope and collapse. EXAM: BILATERAL CAROTID DUPLEX ULTRASOUND TECHNIQUE: Wallace Cullens scale imaging, color Doppler and duplex ultrasound were performed of bilateral carotid and vertebral arteries in the neck. COMPARISON:  None. FINDINGS: Criteria: Quantification of carotid stenosis is based on velocity parameters that correlate the residual internal carotid diameter with NASCET-based stenosis levels, using the diameter of the distal internal carotid lumen as the denominator for  stenosis measurement. The following velocity measurements were obtained: RIGHT ICA: 59 cm/sec CCA: 87 cm/sec SYSTOLIC ICA/CCA RATIO:  0.7 ECA: 67 cm/sec LEFT ICA: 76 cm/sec CCA: 69 cm/sec SYSTOLIC ICA/CCA RATIO:  1.1 ECA: 70 cm/sec RIGHT CAROTID ARTERY: Minimal atherosclerotic plaque in the right carotid bulb extending into the proximal ICA. There is atherosclerotic plaque in the external carotid artery. RIGHT VERTEBRAL ARTERY:  Patent with anteverted flow. LEFT CAROTID ARTERY: Mild plaque in the carotid bulb extending into the proximal ICA. LEFT VERTEBRAL ARTERY: There appears to be somewhat retrograde flow in the left vertebral artery. The left subclavian artery appears patent with normal direction. Focal stenosis at the origin of the left subclavian artery is not excluded. CT angiography may provide better evaluation. Upper extremity blood pressures: RIGHT: 110/71 LEFT: 105/78 IMPRESSION: 1. Less than 50% ICA stenosis bilaterally. 2. Retrograde appearance of flow in the left vertebral artery suspicious for subclavian steal syndrome. CT angiography is recommended to evaluate for possible proximal subclavian artery stenosis. Electronically Signed   By: Elgie Collard M.D.   On: 05/18/2021 22:26   ECHOCARDIOGRAM COMPLETE  Result Date: 05/19/2021    ECHOCARDIOGRAM REPORT   Patient Name:   HALL BIRCHARD Date of Exam: 05/19/2021 Medical Rec #:  [id number]    Height:       68.0 in Accession #:    [id number]   Weight:       274.0 lb Date of Birth:  Mar 08, 1951    BSA:          2.336 m Patient Age:    70 years     BP:           104/83 mmHg Patient Gender: M            HR:           117 bpm. Exam Location:  [facility] Procedure: 2D Echo, Color Doppler and Cardiac Doppler Indications:     R55 Syncope  History:         Patient has prior history of Echocardiogram examinations, most                  recent 08/30/2018. COPD and DVT; Risk Factors:Diabetes. Hx of                  liver cirrhosis.  Sonographer:     Humphrey Rolls Referring  Phys:  Kenn File DOUTOVA Diagnosing Phys: Lorine Bears MD  Sonographer Comments: No parasternal window and no subcostal window. Image acquisition challenging due to patient body habitus and Image acquisition challenging due to COPD. IMPRESSIONS  1. Left ventricular ejection fraction, by estimation, is 45 to 50%. The left ventricle has mildly decreased function. Left ventricular endocardial border not optimally defined  to evaluate regional wall motion. Left ventricular diastolic parameters are indeterminate.  2. Right ventricular systolic function is mildly reduced. The right ventricular size is mildly enlarged. There is normal pulmonary artery systolic pressure.  3. Right atrial size was moderately dilated.  4. The mitral valve is normal in structure. No evidence of mitral valve regurgitation. No evidence of mitral stenosis.  5. The aortic valve is normal in structure. Aortic valve regurgitation is not visualized. Mild to moderate aortic valve sclerosis/calcification is present, without any evidence of aortic stenosis.  6. The inferior vena cava is normal in size with greater than 50% respiratory variability, suggesting right atrial pressure of 3 mmHg.  7. Challenging image quality. FINDINGS  Left Ventricle: Left ventricular ejection fraction, by estimation, is 45 to 50%. The left ventricle has mildly decreased function. Left ventricular endocardial border not optimally defined to evaluate regional wall motion. The left ventricular internal cavity size was normal in size. There is no left ventricular hypertrophy. Left ventricular diastolic parameters are indeterminate. Right Ventricle: The right ventricular size is mildly enlarged. No increase in right ventricular wall thickness. Right ventricular systolic function is mildly reduced. There is normal pulmonary artery systolic pressure. The tricuspid regurgitant velocity  is 2.03 m/s, and with an assumed right atrial pressure of 5 mmHg, the estimated right  ventricular systolic pressure is 21.5 mmHg. Left Atrium: Left atrial size was normal in size. Right Atrium: Right atrial size was moderately dilated. Pericardium: There is no evidence of pericardial effusion. Mitral Valve: The mitral valve is normal in structure. No evidence of mitral valve regurgitation. No evidence of mitral valve stenosis. MV peak gradient, 4.0 mmHg. The mean mitral valve gradient is 2.0 mmHg. Tricuspid Valve: The tricuspid valve is normal in structure. Tricuspid valve regurgitation is trivial. No evidence of tricuspid stenosis. Aortic Valve: The aortic valve is normal in structure. Aortic valve regurgitation is not visualized. Mild to moderate aortic valve sclerosis/calcification is present, without any evidence of aortic stenosis. Aortic valve mean gradient measures 6.0 mmHg. Aortic valve peak gradient measures 10.9 mmHg. Aortic valve area, by VTI measures 2.46 cm. Pulmonic Valve: The pulmonic valve was normal in structure. Pulmonic valve regurgitation is not visualized. No evidence of pulmonic stenosis. Aorta: The aortic root is normal in size and structure. Venous: The inferior vena cava is normal in size with greater than 50% respiratory variability, suggesting right atrial pressure of 3 mmHg. IAS/Shunts: No atrial level shunt detected by color flow Doppler.  LEFT VENTRICLE PLAX 2D LVOT diam:     2.40 cm      Diastology LV SV:         57           LV e' medial:    8.49 cm/s LV SV Index:   24           LV E/e' medial:  10.8 LVOT Area:     4.52 cm     LV e' lateral:   9.57 cm/s                             LV E/e' lateral: 9.6  LV Volumes (MOD) LV vol d, MOD A2C: 100.0 ml LV vol d, MOD A4C: 77.0 ml LV vol s, MOD A2C: 48.6 ml LV vol s, MOD A4C: 38.8 ml LV SV MOD A2C:     51.4 ml LV SV MOD A4C:     77.0 ml LV SV MOD BP:  45.4 ml RIGHT VENTRICLE RV Basal diam:  4.18 cm RV Mid diam:    4.87 cm RV S prime:     12.60 cm/s LEFT ATRIUM             Index       RIGHT ATRIUM           Index LA Vol  (A2C):   32.8 ml 14.04 ml/m RA Area:     30.60 cm LA Vol (A4C):   35.7 ml 15.28 ml/m RA Volume:   110.00 ml 47.08 ml/m LA Biplane Vol: 35.8 ml 15.32 ml/m  AORTIC VALVE AV Area (Vmax):    2.18 cm AV Area (Vmean):   2.13 cm AV Area (VTI):     2.46 cm AV Vmax:           165.00 cm/s AV Vmean:          112.000 cm/s AV VTI:            0.230 m AV Peak Grad:      10.9 mmHg AV Mean Grad:      6.0 mmHg LVOT Vmax:         79.60 cm/s LVOT Vmean:        52.700 cm/s LVOT VTI:          0.125 m LVOT/AV VTI ratio: 0.54  AORTA Ao Root diam: 2.60 cm MITRAL VALVE               TRICUSPID VALVE MV Area (PHT): 5.20 cm    TR Peak grad:   16.5 mmHg MV Area VTI:   4.63 cm    TR Vmax:        203.00 cm/s MV Peak grad:  4.0 mmHg MV Mean grad:  2.0 mmHg    SHUNTS MV Vmax:       1.00 m/s    Systemic VTI:  0.12 m MV Vmean:      74.7 cm/s   Systemic Diam: 2.40 cm MV Decel Time: 146 msec MV E velocity: 91.43 cm/s Lorine Bears MD Electronically signed by Lorine Bears MD Signature Date/Time: 05/19/2021/4:35:30 PM    Final    [place] ASCITES (ABDOMEN LIMITED)  Result Date: 05/19/2021 CLINICAL DATA:  Ascites EXAM: LIMITED ABDOMEN ULTRASOUND FOR ASCITES TECHNIQUE: Limited ultrasound survey for ascites was performed in all four abdominal quadrants. COMPARISON:  CT 05/18/2021 FINDINGS: There is moderate volume abdominal ascites seen in the left upper and lower quadrants. No ascites seen in the right upper or right lower quadrants. IMPRESSION: Moderate volume ascites seen in the left upper and lower quadrants. Electronically Signed   By: Caprice Renshaw M.D.   On: 05/19/2021 14:37        Scheduled Meds:  apixaban  5 mg Oral BID   digoxin  0.25 mg Oral Daily   folic acid  1 mg Oral Daily   insulin aspart  0-9 Units Subcutaneous Q4H   insulin glargine-yfgn  30 Units Subcutaneous BID   lactulose  30 g Oral TID   levothyroxine  100 mcg Oral QAC breakfast   LORazepam  0-4 mg Intravenous Q6H   Or   LORazepam  0-4 mg Oral Q6H   [START ON  05/21/2021] LORazepam  0-4 mg Intravenous Q12H   Or   [START ON 05/21/2021] LORazepam  0-4 mg Oral Q12H   metoprolol tartrate  12.5 mg Oral BID   midodrine  5 mg Oral TID WC   rifaximin  550 mg Oral BID  sodium chloride flush  3 mL Intravenous Q12H   thiamine  100 mg Oral Daily   Or   thiamine  100 mg Intravenous Daily   Continuous Infusions:  sodium chloride     magnesium sulfate bolus IVPB      Assessment & Plan:   Active Problems:   Type 2 diabetes mellitus with peripheral neuropathy (HCC)   COPD (chronic obstructive pulmonary disease) (HCC)   Hypotension   PAF (paroxysmal atrial fibrillation) (HCC)   Hypothyroidism   Syncope   Thrombocytopenia (HCC)   Alcoholic cirrhosis of liver without ascites (HCC)   Chronic alcohol abuse   Gastroesophageal reflux disease without esophagitis   Acute respiratory failure (HCC)   Acute hepatic encephalopathy   Prolonged QT interval   70 y.o. male with medical history significant of Alcoholic cirrhosis of liver with ascites, Hepatic encephalopathy syndrome, hypothyrodism, bilateral PE, COPD, Dm2, A.fib, HLD, SLE,   peripheral neuropathy, CAD    Admitted for syncope, hypotension   Present on Admission:   Syncope -possibly secondary to transient hypotension from  afib rvr  CT negative for PE Echo with EF of 45 to 50%.  LV endocardial border not optimally defined to evaluate regional wall motion. Troponin negative Carotid ultrasound no significant ICA stenosis bilaterally.  Findings for possible subclavian steal syndrome.  CT angiography is recommended to evaluate for proximal subclavian artery stenosis.   Afib /flutter rvr- On monitor with fib /flutter. On eliquis 9/3 -heart rate still not controlled well Echo with LV dysfunction as noted above Cards input was appreciated, although may be risky with his current falls they recommend keeping him on anticoagulation given significant elevated CHA2DS2-VASc score. Will try rate control,  loaded with oral dig and then maintenance 0.125 once daily Possible cardioversion at some point in the near future can be considered as outpatient setting   Cardiomyopathy- tachy mediated v.s. alcohol induced Volume overloaded Will need to totally abstain from alcohol use Need afib/flutter rate control v.s. Rhythm...>cards will decide long term plan Increase midodrine to  tid Add lasix  iv bid If bp still stable will add aldactone per gi rec.     Decompensated alcoholic cirrhosis  Since hypotensive spironolactone held  Attempted gentle diuresing given anasarca on admission  Counseled about alcohol cessation but he does not appear to be interested to quit his 2-12 ounce beers daily  GI consulted -input appreciated. Recommend avoiding NSAIDs.  Hold off on Lasix and Aldactone as he does not have ascites and he came in hypotensive 9/3 abdominal ultrasound revealed moderate ascites  GI recommends low-dose diuretics and spironolactone if okay by cardiology  If abdominal distention is worsening patient will need therapeutic paracentesis with complete fluid analysis  Currently on Eliquis will need to be interrupted for 48 hours before going paracentesis  EGD as outpatient for variceal screening, no signs of GI bleed at this time  Possibly macrocytic anemia due to alcohol abuse and cirrhosis -we will check iron panel, B12 and folate levels replete if low  Continue rifaximin and lactulose  Patient not interested in going to alcohol Anonymous he would like to try on his own   Hypomagnesemia Mag 1.5.  Will give IV mag 2 g monitor levels  Alcohol abuse- he reports now he is only drinking 2 12oz beer daily and doesn't appear to want to stop despite cousneling him On icwa protocal     Acute respiratory failure (HCC) thought to be possibly secondary to fluid overload in the emergency department received albumin  if blood pressure allows can try gentle diuresis    Type 2 diabetes  mellitus with peripheral neuropathy (HCC) -  -  BG stable Continue R-ISS      Thrombocytopenia (HCC) likely secondary to liver disease      Hypothyroidism continue Synthroid    Hypotension transient at baseline systolics in the 110 chronically tachycardic    Gastroesophageal reflux disease without esophagitis PPI    COPD (chronic obstructive pulmonary disease) (HCC) currently appears to be stable albuterol as needed        Acute hepatic encephalopathy -continue with lactulose repeat ammonia level    Diarrhea -seems that loose stools much worse than his baseline.  Check gastric panel and C. difficile given foul-smelling diarrhea which is frequent    Prolonged qt - - will monitor on tele avoid QT prolonging medications, rehydrate correct electrolytes     DVT prophylaxis: Eliquis Code Status: Full Family Communication: None at bedside Disposition Plan:  Status is: Inpatient  Remains inpatient appropriate because:Inpatient level of care appropriate due to severity of illness  Dispo: The patient is from: Home              Anticipated d/c is to: SNF- pt wold like to think about it.              Patient currently is not medically stable to d/c.   Difficult to place patient No            LOS: 2 days   Time spent: 35 minutes with more than 50% on COC    Lynn Ito, MD [facility] Pager [phone]  If 7PM-7AM, please contact night-coverage 05/20/2021, 8:13 AM

## 2021-05-20 NOTE — Progress Notes (Signed)
Patient requesting seroquel 200mg  for night time.  He states admitted provider stopped that med when he was admitted and states he has been taking for years.  Pt reports not able to sleep at all last night.  Also questioning q4 accu checks.  Pt ambulates in room independently with no issues, pt gait is steady and he is alert and oriented x 4.  Pt on a diet, and denies any N/V.   Sent secure message to Dr. Opyd per above.  [id number] - Dr. [id number] gave 1 time order of seroquel and reports pt should address with day attending during rounds.

## 2021-05-21 DIAGNOSIS — K7031 Alcoholic cirrhosis of liver with ascites: Secondary | ICD-10-CM | POA: Diagnosis not present

## 2021-05-21 DIAGNOSIS — I480 Paroxysmal atrial fibrillation: Secondary | ICD-10-CM

## 2021-05-21 LAB — GLUCOSE, CAPILLARY
Glucose-Capillary: 79 mg/dL (ref 70–99)
Glucose-Capillary: 93 mg/dL (ref 70–99)
Glucose-Capillary: 90 mg/dL (ref 70–99)
Glucose-Capillary: 109 mg/dL — ABNORMAL HIGH (ref 70–99)

## 2021-05-21 LAB — COMPREHENSIVE METABOLIC PANEL
ALT: 19 U/L (ref 0–44)
AST: 51 U/L — ABNORMAL HIGH (ref 15–41)
Albumin: 2.4 g/dL — ABNORMAL LOW (ref 3.5–5.0)
Alkaline Phosphatase: 115 U/L (ref 38–126)
Anion gap: 6 (ref 5–15)
BUN: 10 mg/dL (ref 8–23)
CO2: 30 mmol/L (ref 22–32)
Calcium: 8.4 mg/dL — ABNORMAL LOW (ref 8.9–10.3)
Chloride: 97 mmol/L — ABNORMAL LOW (ref 98–111)
Creatinine, Ser: 0.57 mg/dL — ABNORMAL LOW (ref 0.61–1.24)
GFR, Estimated: >60 mL/min (ref >60)
Glucose, Bld: 85 mg/dL (ref 70–99)
Potassium: 3.7 mmol/L (ref 3.5–5.1)
Sodium: 133 mmol/L — ABNORMAL LOW (ref 135–145)
Total Bilirubin: 4 mg/dL — ABNORMAL HIGH (ref 0.3–1.2)
Total Protein: 5.8 g/dL — ABNORMAL LOW (ref 6.5–8.1)

## 2021-05-21 LAB — VITAMIN B12: Vitamin B-12: 519 pg/mL (ref 180–914)

## 2021-05-21 LAB — MAGNESIUM: Magnesium: 1.8 mg/dL (ref 1.7–2.4)

## 2021-05-21 MED ORDER — SPIRONOLACTONE 25 MG PO TABS
25.0000 mg | ORAL_TABLET | Freq: Every day | ORAL | Status: DC
  Administered 2021-05-21 – 2021-05-23 (×3): 25 mg via ORAL
  Filled 2021-05-21 (×3): qty 1

## 2021-05-21 NOTE — Progress Notes (Signed)
PROGRESS NOTE    Lawrence Osborn  ZOX:[id number] DOB: 10/10/1950 DOA: 05/18/2021 PCP: Danella Penton, MD    Brief Narrative:  Lawrence Osborn is a 70 y.o. male with medical history significant of Alcoholic cirrhosis of liver with ascites, Hepatic encephalopathy syndrome, hypothyrodism, bilateral PE, COPD, Dm2, A.fib, HLD, SLE,   peripheral neuropathy, CAD     Presented with   syncope lasting 30 seconds he was getting out of his car and fell out and hit concrete.  Hit the front of his head. Did not want to come with EMS initially.  He finally did agree to come because he was still feeling somewhat unsteady chronic diarrhea has been using lactulose Notes peripheral edema Now drinks 2 bears a day Denies any fevers or chills no cough nausea vomiting  reports rush for 1 week on his abdomen      Does not smoke Drinks 2 beers a day Reports his abdomen has gotten smoller His bp runs in low 100 and Hr in 120's   Patient reports although he takes regular lactulose his stools have been usually well formed for the past 3 days he been having very runny frequent diarrhea with makes him up from night with a foul smell.  Denies biotic use.  No change in lactulose dosing that he is aware of he actually stopped taking his lactulose today because of frequent bowel movement  9/2- HR on monitor in 110's denies sob or cp.  9/3- pt appears mildly annoyed by my presence as he was on the phone. Denies sob, dizziness, abd pain.  9/4 scored 5 on CIWA protocol early this a.m. was given Ativan.  Had received his Seroquel last night  Consultants:  GI  Procedures:   Antimicrobials:      Subjective: Patient's sleeping.  Objective: Vitals:   05/20/21 2117 05/20/21 2338 05/21/21 0417 05/21/21 0750  BP: 114/76 91/61 95/68  106/69  Pulse: (!) 115 (!) 120 (!) 116 (!) 109  Resp: Temp: 98.4 F (36.9 C) 98 F (36.7 C) 98.1 F (36.7 C) 98 F (36.7 C)  TempSrc: Oral  Axillary   SpO2: 96% 96% 96% 100%   Weight:      Height:       No intake or output data in the 24 hours ending 05/21/21 0850  Filed Weights   05/18/21 1538 05/19/21 2100  Weight: 124.3 kg 118.1 kg    Examination: Sleepy, does not wake up for the exam CTA with poor respiratory effort no wheeze  irregular tacky S1-S2 no gallop Soft distended No edema Unable to assess neuro exam   Data Reviewed: I have personally reviewed following labs and imaging studies  CBC: Recent Labs  Lab 05/18/21 1602 05/19/21 0410  WBC 5.5 4.2  NEUTROABS  --  2.6  HGB 15.2 12.9*  HCT 42.1 36.4*  MCV 100.7* 102.5*  PLT 94* 68*   Basic Metabolic Panel: Recent Labs  Lab 05/18/21 1602 05/18/21 1759 05/19/21 0410 05/21/21 0559  NA 133*  --  135 133*  K 4.4  --  3.9 3.7  CL 95*  --  100 97*  CO2 31  --  30 30  GLUCOSE 116*  --  85 85  BUN 8  --  8 10  CREATININE 0.82  --  0.71 0.57*  CALCIUM 8.8*  --  8.1* 8.4*  MG 1.8  --  1.5* 1.8  PHOS  --  4.0 4.3  --    GFR: Estimated  Creatinine Clearance: 110.4 mL/min (A) (by C-G formula based on SCr of 0.57 mg/dL (L)). Liver Function Tests: Recent Labs  Lab 05/18/21 1602 05/19/21 0410 05/21/21 0559  AST 50* 39 51*  ALT 20 15 19   ALKPHOS 141* 106 115  BILITOT 3.5* 2.6* 4.0*  PROT 6.8 5.8* 5.8*  ALBUMIN 2.5* 2.4* 2.4*   No results for input(s): LIPASE, AMYLASE in the last 168 hours. Recent Labs  Lab 05/18/21 1602 05/19/21 0409  AMMONIA 97* 77*   Coagulation Profile: Recent Labs  Lab 05/18/21 1602  INR 1.7*   Cardiac Enzymes: Recent Labs  Lab 05/18/21 1759  CKTOTAL 46*   BNP (last 3 results) No results for input(s): PROBNP in the last 8760 hours. HbA1C: Recent Labs    05/18/21 2123  HGBA1C 5.1   CBG: Recent Labs  Lab 05/20/21 0026 05/20/21 0504 05/20/21 0733 05/20/21 1212 05/20/21 1931  GLUCAP 136* 137* 105* 106* 154*   Lipid Profile: No results for input(s): CHOL, HDL, LDLCALC, TRIG, CHOLHDL, LDLDIRECT in the last 72 hours. Thyroid Function  Tests: Recent Labs    05/19/21 0410  TSH 4.994*   Anemia Panel: Recent Labs    05/20/21 1218  VITAMINB12 519  FOLATE 25.0  FERRITIN 204  TIBC 199*  IRON 138   Sepsis Labs: Recent Labs  Lab 05/18/21 1602 05/18/21 1759 05/18/21 2123 05/19/21 0003  LATICACIDVEN 2.6* 2.8* 2.8* 2.1*    Recent Results (from the past 240 hour(s))  Blood culture (routine single)     Status: None (Preliminary result)   Collection Time: 05/18/21  4:02 PM   Specimen: BLOOD  Result Value Ref Range Status   Specimen Description BLOOD BLOOD RIGHT FOREARM  Final   Special Requests   Final    BOTTLES DRAWN AEROBIC AND ANAEROBIC Blood Culture adequate volume   Culture   Final    NO GROWTH 2 DAYS Performed at [facility], [address], [place], [place]    Report Status PENDING  Incomplete  Resp Panel by RT-PCR (Flu A&B, Covid) Nasopharyngeal Swab     Status: None   Collection Time: 05/18/21  4:09 PM   Specimen: Nasopharyngeal Swab; Nasopharyngeal(NP) swabs in vial transport medium  Result Value Ref Range Status   SARS Coronavirus 2 by RT PCR NEGATIVE NEGATIVE Final    Comment: (NOTE) SARS-CoV-2 target nucleic acids are NOT DETECTED.  The SARS-CoV-2 RNA is generally detectable in upper respiratory specimens during the acute phase of infection. The lowest concentration of SARS-CoV-2 viral copies this assay can detect is 138 copies/mL. A negative result does not preclude SARS-Cov-2 infection and should not be used as the sole basis for treatment or other patient management decisions. A negative result may occur with  improper specimen collection/handling, submission of specimen other than nasopharyngeal swab, presence of viral mutation(s) within the areas targeted by this assay, and inadequate number of viral copies(<138 copies/mL). A negative result must be combined with clinical observations, patient history, and epidemiological information. The expected result is  Negative.  Fact Sheet for Patients:  07/18/21  Fact Sheet for Healthcare Providers:  BloggerCourse.com  This test is no t yet approved or cleared by the [place] FDA and  has been authorized for detection and/or diagnosis of SARS-CoV-2 by FDA under an Emergency Use Authorization (EUA). This EUA will remain  in effect (meaning this test can be used) for the duration of the COVID-19 declaration under Section 564(b)(1) of the Act, 21 U.S.C.section 360bbb-3(b)(1), unless the authorization is  terminated  or revoked sooner.       Influenza A by PCR NEGATIVE NEGATIVE Final   Influenza B by PCR NEGATIVE NEGATIVE Final    Comment: (NOTE) The Xpert Xpress SARS-CoV-2/FLU/RSV plus assay is intended as an aid in the diagnosis of influenza from Nasopharyngeal swab specimens and should not be used as a sole basis for treatment. Nasal washings and aspirates are unacceptable for Xpert Xpress SARS-CoV-2/FLU/RSV testing.  Fact Sheet for Patients: BloggerCourse.comhttps://www.fda.gov/media/152166/download  Fact Sheet for Healthcare Providers: SeriousBroker.ithttps://www.fda.gov/media/152162/download  This test is not yet approved or cleared by the [place] FDA and has been authorized for detection and/or diagnosis of SARS-CoV-2 by FDA under an Emergency Use Authorization (EUA). This EUA will remain in effect (meaning this test can be used) for the duration of the COVID-19 declaration under Section 564(b)(1) of the Act, 21 U.S.C. section 360bbb-3(b)(1), unless the authorization is terminated or revoked.  Performed at [facility], [address] [place], [place] [address]          Radiology Studies: ECHOCARDIOGRAM COMPLETE  Result Date: 05/19/2021    ECHOCARDIOGRAM REPORT   Patient Name:   Lawrence KocherHILIP Lear Date of Exam: 05/19/2021 Medical Rec #:  [id number]    Height:       68.0 in Accession #:    [id number]   Weight:       274.0 lb Date of Birth:   [birth year]    BSA:          2.336 m Patient Age:    70 years     BP:           104/83 mmHg Patient Gender: M            HR:           117 bpm. Exam Location:  [facility] Procedure: 2D Echo, Color Doppler and Cardiac Doppler Indications:     R55 Syncope  History:         Patient has prior history of Echocardiogram examinations, most                  recent 08/30/2018. COPD and DVT; Risk Factors:Diabetes. Hx of                  liver cirrhosis.  Sonographer:     Humphrey RollsJoan Heiss Referring Phys:  [id number] ANASTASSIA DOUTOVA Diagnosing Phys: Lorine BearsMuhammad Arida MD  Sonographer Comments: No parasternal window and no subcostal window. Image acquisition challenging due to patient body habitus and Image acquisition challenging due to COPD. IMPRESSIONS  1. Left ventricular ejection fraction, by estimation, is 45 to 50%. The left ventricle has mildly decreased function. Left ventricular endocardial border not optimally defined to evaluate regional wall motion. Left ventricular diastolic parameters are indeterminate.  2. Right ventricular systolic function is mildly reduced. The right ventricular size is mildly enlarged. There is normal pulmonary artery systolic pressure.  3. Right atrial size was moderately dilated.  4. The mitral valve is normal in structure. No evidence of mitral valve regurgitation. No evidence of mitral stenosis.  5. The aortic valve is normal in structure. Aortic valve regurgitation is not visualized. Mild to moderate aortic valve sclerosis/calcification is present, without any evidence of aortic stenosis.  6. The inferior vena cava is normal in size with greater than 50% respiratory variability, suggesting right atrial pressure of 3 mmHg.  7. Challenging image quality. FINDINGS  Left Ventricle: Left ventricular ejection fraction, by estimation, is 45 to 50%. The left ventricle has mildly decreased  function. Left ventricular endocardial border not optimally defined to evaluate regional wall motion. The left ventricular  internal cavity size was normal in size. There is no left ventricular hypertrophy. Left ventricular diastolic parameters are indeterminate. Right Ventricle: The right ventricular size is mildly enlarged. No increase in right ventricular wall thickness. Right ventricular systolic function is mildly reduced. There is normal pulmonary artery systolic pressure. The tricuspid regurgitant velocity  is 2.03 m/s, and with an assumed right atrial pressure of 5 mmHg, the estimated right ventricular systolic pressure is 21.5 mmHg. Left Atrium: Left atrial size was normal in size. Right Atrium: Right atrial size was moderately dilated. Pericardium: There is no evidence of pericardial effusion. Mitral Valve: The mitral valve is normal in structure. No evidence of mitral valve regurgitation. No evidence of mitral valve stenosis. MV peak gradient, 4.0 mmHg. The mean mitral valve gradient is 2.0 mmHg. Tricuspid Valve: The tricuspid valve is normal in structure. Tricuspid valve regurgitation is trivial. No evidence of tricuspid stenosis. Aortic Valve: The aortic valve is normal in structure. Aortic valve regurgitation is not visualized. Mild to moderate aortic valve sclerosis/calcification is present, without any evidence of aortic stenosis. Aortic valve mean gradient measures 6.0 mmHg. Aortic valve peak gradient measures 10.9 mmHg. Aortic valve area, by VTI measures 2.46 cm. Pulmonic Valve: The pulmonic valve was normal in structure. Pulmonic valve regurgitation is not visualized. No evidence of pulmonic stenosis. Aorta: The aortic root is normal in size and structure. Venous: The inferior vena cava is normal in size with greater than 50% respiratory variability, suggesting right atrial pressure of 3 mmHg. IAS/Shunts: No atrial level shunt detected by color flow Doppler.  LEFT VENTRICLE PLAX 2D LVOT diam:     2.40 cm      Diastology LV SV:         57           LV e' medial:    8.49 cm/s LV SV Index:   24           LV E/e' medial:   10.8 LVOT Area:     4.52 cm     LV e' lateral:   9.57 cm/s                             LV E/e' lateral: 9.6  LV Volumes (MOD) LV vol d, MOD A2C: 100.0 ml LV vol d, MOD A4C: 77.0 ml LV vol s, MOD A2C: 48.6 ml LV vol s, MOD A4C: 38.8 ml LV SV MOD A2C:     51.4 ml LV SV MOD A4C:     77.0 ml LV SV MOD BP:      45.4 ml RIGHT VENTRICLE RV Basal diam:  4.18 cm RV Mid diam:    4.87 cm RV S prime:     12.60 cm/s LEFT ATRIUM             Index       RIGHT ATRIUM           Index LA Vol (A2C):   32.8 ml 14.04 ml/m RA Area:     30.60 cm LA Vol (A4C):   35.7 ml 15.28 ml/m RA Volume:   110.00 ml 47.08 ml/m LA Biplane Vol: 35.8 ml 15.32 ml/m  AORTIC VALVE AV Area (Vmax):    2.18 cm AV Area (Vmean):   2.13 cm AV Area (VTI):     2.46 cm AV Vmax:  165.00 cm/s AV Vmean:          112.000 cm/s AV VTI:            0.230 m AV Peak Grad:      10.9 mmHg AV Mean Grad:      6.0 mmHg LVOT Vmax:         79.60 cm/s LVOT Vmean:        52.700 cm/s LVOT VTI:          0.125 m LVOT/AV VTI ratio: 0.54  AORTA Ao Root diam: 2.60 cm MITRAL VALVE               TRICUSPID VALVE MV Area (PHT): 5.20 cm    TR Peak grad:   16.5 mmHg MV Area VTI:   4.63 cm    TR Vmax:        203.00 cm/s MV Peak grad:  4.0 mmHg MV Mean grad:  2.0 mmHg    SHUNTS MV Vmax:       1.00 m/s    Systemic VTI:  0.12 m MV Vmean:      74.7 cm/s   Systemic Diam: 2.40 cm MV Decel Time: 146 msec MV E velocity: 91.43 cm/s Lorine Bears MD Electronically signed by Lorine Bears MD Signature Date/Time: 05/19/2021/4:35:30 PM    Final    [place] ASCITES (ABDOMEN LIMITED)  Result Date: 05/19/2021 CLINICAL DATA:  Ascites EXAM: LIMITED ABDOMEN ULTRASOUND FOR ASCITES TECHNIQUE: Limited ultrasound survey for ascites was performed in all four abdominal quadrants. COMPARISON:  CT 05/18/2021 FINDINGS: There is moderate volume abdominal ascites seen in the left upper and lower quadrants. No ascites seen in the right upper or right lower quadrants. IMPRESSION: Moderate volume ascites seen in  the left upper and lower quadrants. Electronically Signed   By: Caprice Renshaw M.D.   On: 05/19/2021 14:37        Scheduled Meds:  apixaban  5 mg Oral BID   digoxin  0.25 mg Oral Daily   folic acid  1 mg Oral Daily   furosemide  20 mg Intravenous BID   insulin aspart  0-9 Units Subcutaneous TID WC   insulin glargine-yfgn  30 Units Subcutaneous BID   lactulose  30 g Oral TID   levothyroxine  100 mcg Oral QAC breakfast   LORazepam  0-4 mg Intravenous Q4H   Followed by   Melene Muller ON 05/23/2021] LORazepam  0-4 mg Intravenous Q8H   metoprolol tartrate  25 mg Oral BID   midodrine  10 mg Oral TID WC   rifaximin  550 mg Oral BID   sodium chloride flush  3 mL Intravenous Q12H   thiamine  100 mg Oral Daily   Or   thiamine  100 mg Intravenous Daily   Continuous Infusions:  sodium chloride      Assessment & Plan:   Active Problems:   Type 2 diabetes mellitus with peripheral neuropathy (HCC)   COPD (chronic obstructive pulmonary disease) (HCC)   Hypotension   PAF (paroxysmal atrial fibrillation) (HCC)   Hypothyroidism   Syncope   Thrombocytopenia (HCC)   Alcoholic cirrhosis of liver without ascites (HCC)   Chronic alcohol abuse   Gastroesophageal reflux disease without esophagitis   Acute respiratory failure (HCC)   Acute hepatic encephalopathy   Prolonged QT interval   70 y.o. male with medical history significant of Alcoholic cirrhosis of liver with ascites, Hepatic encephalopathy syndrome, hypothyrodism, bilateral PE, COPD, Dm2, A.fib, HLD, SLE,   peripheral neuropathy, CAD  Admitted for syncope, hypotension   Present on Admission:   Syncope -possibly secondary to transient hypotension from  afib rvr  CT negative for PE Echo with EF of 45 to 50%.  LV endocardial border not optimally defined to evaluate regional wall motion. Troponin negative Carotid ultrasound no significant ICA stenosis bilaterally.  Findings for possible subclavian steal syndrome.  CT angiography is  recommended to evaluate for proximal subclavian artery stenosis. 9/4 once more stable, may f/u on cta as above On midodrine, BP normotensive to low  Afib /flutter rvr- On monitor with fib /flutter. On eliquis 9/3 -heart rate still not controlled well Echo with LV dysfunction as noted above Cards input was appreciated, although may be risky with his current falls they recommend keeping him on anticoagulation given significant elevated CHA2DS2-VASc score. Will try rate control, loaded with oral dig and then maintenance 0.125 once daily Possible cardioversion at some point in the near future can be considered as outpatient setting 9/4 heart rate still not well controlled.  Cardiology following on midodrine for hypotension Continue digoxin and beta blk  Cardiomyopathy- tachy mediated v.s. alcohol induced Volume overloaded Will need to totally abstain from alcohol use Need afib/flutter rate control v.s. Rhythm...>cards will decide long term plan Increase midodrine to 10mg  tid 9/4 continue IV Lasix at current dose of 20 mg twice daily, Will add aldactone 25mg  since also needs it for his cirrhosis        Decompensated alcoholic cirrhosis  Since hypotensive spironolactone held  Attempted gentle diuresing given anasarca on admission  Counseled about alcohol cessation but he does not appear to be interested to quit his 2-12 ounce beers daily  GI consulted -input appreciated. Recommend avoiding NSAIDs.  Hold off on Lasix and Aldactone as he does not have ascites and he came in hypotensive 9/3 abdominal ultrasound revealed moderate ascites  GI recommends low-dose diuretics and spironolactone if okay by cardiology  If abdominal distention is worsening patient will need therapeutic paracentesis with complete fluid analysis  Currently on Eliquis will need to be interrupted for 48 hours before going paracentesis  EGD as outpatient for variceal screening, no signs of GI bleed at this time   Possibly macrocytic anemia due to alcohol abuse and cirrhosis -we will check iron panel, B12 and folate levels replete if low  Continue rifaximin and lactulose  Patient not interested in going to alcohol Anonymous he would like to try on his own  Will add Aldactone as above   Hypomagnesemia Supplemented.  Magnesium 1.8 today  Alcohol abuse- he reports now he is only drinking 2 12oz beer daily and doesn't appear to want to stop despite cousneling him On icwa protocal- scored 5 this am     Acute respiratory failure (HCC) thought to be possibly secondary to fluid overload in the emergency department received albumin if blood pressure allows can try gentle diuresis    Type 2 diabetes mellitus with peripheral neuropathy (HCC) -  -  BG stable Continue R-ISS      Thrombocytopenia (HCC) likely secondary to liver disease      Hypothyroidism continue Synthroid    Hypotension transient at baseline systolics in the 110 chronically tachycardic    Gastroesophageal reflux disease without esophagitis PPI    COPD (chronic obstructive pulmonary disease) (HCC) currently appears to be stable albuterol as needed        Acute hepatic encephalopathy -continue with lactulose repeat ammonia level    Diarrhea -seems that loose stools much worse than his baseline.  Check gastric panel and C. difficile given foul-smelling diarrhea which is frequent    Prolonged qt - - will monitor on tele avoid QT prolonging medications, rehydrate correct electrolytes     DVT prophylaxis: Eliquis Code Status: Full Family Communication: Wife at bedside Disposition Plan:  Status is: Inpatient  Remains inpatient appropriate because:Inpatient level of care appropriate due to severity of illness  Dispo: The patient is from: Home              Anticipated d/c is to: SNF- pt wold like to think about it., likely will not go per wife              Patient currently is not medically stable to d/c.   Difficult to  place patient No            LOS: 3 days   Time spent: 35 minutes with more than 50% on COC    Lynn Ito, MD [facility] Pager [phone]  If 7PM-7AM, please contact night-coverage 05/21/2021, 8:50 AM

## 2021-05-21 NOTE — Progress Notes (Addendum)
Progress Note  Patient Name: Lawrence Osborn Date of Encounter: 05/21/2021  [facility] Cardiologist: new Dr. Kirke Corin rounding  Subjective   Sleeping comfortably, denies palpitations, wife at bedside.  Received Ativan earlier for possible withdrawal.  Inpatient Medications    Scheduled Meds:  apixaban  5 mg Oral BID   digoxin  0.25 mg Oral Daily   folic acid  1 mg Oral Daily   furosemide  20 mg Intravenous BID   insulin aspart  0-9 Units Subcutaneous TID WC   insulin glargine-yfgn  30 Units Subcutaneous BID   lactulose  30 g Oral TID   levothyroxine  100 mcg Oral QAC breakfast   LORazepam  0-4 mg Intravenous Q4H   Followed by   Melene Muller ON 05/23/2021] LORazepam  0-4 mg Intravenous Q8H   metoprolol tartrate  25 mg Oral BID   midodrine  10 mg Oral TID WC   rifaximin  550 mg Oral BID   sodium chloride flush  3 mL Intravenous Q12H   spironolactone  25 mg Oral Daily   thiamine  100 mg Oral Daily   Or   thiamine  100 mg Intravenous Daily   Continuous Infusions:  sodium chloride     PRN Meds: sodium chloride, albuterol, LORazepam **OR** LORazepam, sodium chloride flush   Vital Signs    Vitals:   05/20/21 2338 05/21/21 0417 05/21/21 0750 05/21/21 1207  BP: 91/61 95/68 106/69 (!) 116/58  Pulse: (!) 120 (!) 116 (!) 109 97  Resp: 20 20 17 18   Temp: 98 F (36.7 C) 98.1 F (36.7 C) 98 F (36.7 C) 97.8 F (36.6 C)  TempSrc:  Axillary    SpO2: 96% 96% 100% 93%  Weight:      Height:        Intake/Output Summary (Last 24 hours) at 05/21/2021 1406 Last data filed at 05/21/2021 0900 Gross per 24 hour  Intake --  Output 800 ml  Net -800 ml   Last 3 Weights 05/19/2021 05/18/2021 04/11/2021  Weight (lbs) 260 lb 6.4 oz 274 lb 288 lb 1.6 oz  Weight (kg) 118.117 kg 124.286 kg 130.681 kg      Telemetry    Atrial fibrillation, heart rate 104- Personally Reviewed  ECG     - Personally Reviewed  Physical Exam   GEN: No acute distress.   Neck: No JVD Cardiac: Irregular  irregular, tachycardic Respiratory: Diminished breath sounds at bases GI: Soft, nontender, severely distended  MS: No edema; No deformity. Neuro:  Nonfocal  Psych: Normal affect   Labs    High Sensitivity Troponin:   Recent Labs  Lab 05/18/21 1602 05/18/21 1759  TROPONINIHS 10 9      Chemistry Recent Labs  Lab 05/18/21 1602 05/19/21 0410 05/21/21 0559  NA 133* 135 133*  K 4.4 3.9 3.7  CL 95* 100 97*  CO2 31 30 30   GLUCOSE 116* 85 85  BUN 8 8 10   CREATININE 0.82 0.71 0.57*  CALCIUM 8.8* 8.1* 8.4*  PROT 6.8 5.8* 5.8*  ALBUMIN 2.5* 2.4* 2.4*  AST 50* 39 51*  ALT 20 15 19   ALKPHOS 141* 106 115  BILITOT 3.5* 2.6* 4.0*  GFRNONAA >60 >60 >60  ANIONGAP 7 5 6      Hematology Recent Labs  Lab 05/18/21 1602 05/19/21 0410  WBC 5.5 4.2  RBC 4.18* 3.55*  HGB 15.2 12.9*  HCT 42.1 36.4*  MCV 100.7* 102.5*  MCH 36.4* 36.3*  MCHC 36.1* 35.4  RDW 13.8 13.9  PLT  94* 68*    BNP Recent Labs  Lab 05/18/21 1602  BNP 318.3*     DDimer No results for input(s): DDIMER in the last 168 hours.   Radiology    [place] ASCITES (ABDOMEN LIMITED)  Result Date: 05/19/2021 CLINICAL DATA:  Ascites EXAM: LIMITED ABDOMEN ULTRASOUND FOR ASCITES TECHNIQUE: Limited ultrasound survey for ascites was performed in all four abdominal quadrants. COMPARISON:  CT 05/18/2021 FINDINGS: There is moderate volume abdominal ascites seen in the left upper and lower quadrants. No ascites seen in the right upper or right lower quadrants. IMPRESSION: Moderate volume ascites seen in the left upper and lower quadrants. Electronically Signed   By: Caprice Renshaw M.D.   On: 05/19/2021 14:37    Cardiac Studies   TTE 05/20/2021 1. Left ventricular ejection fraction, by estimation, is 45 to 50%. The  left ventricle has mildly decreased function. Left ventricular endocardial  border not optimally defined to evaluate regional wall motion. Left  ventricular diastolic parameters are  indeterminate.   2. Right  ventricular systolic function is mildly reduced. The right  ventricular size is mildly enlarged. There is normal pulmonary artery  systolic pressure.   3. Right atrial size was moderately dilated.   4. The mitral valve is normal in structure. No evidence of mitral valve  regurgitation. No evidence of mitral stenosis.   5. The aortic valve is normal in structure. Aortic valve regurgitation is  not visualized. Mild to moderate aortic valve sclerosis/calcification is  present, without any evidence of aortic stenosis.   6. The inferior vena cava is normal in size with greater than 50%  respiratory variability, suggesting right atrial pressure of 3 mmHg.   7. Challenging image quality.   Patient Profile     70 y.o. male with history of atrial fibs/atrial flutter, alcohol induced cirrhosis, presenting with frequent falls, found to be in atrial fibrillation with rapid ventricular response  Assessment & Plan    afib with rapid ventricular response -Heart rate improved to low 100s -Continue Lopressor, digoxin -If patient is withdrawing from EtOH, this might make tachycardia worse. -Continue therapeutic Eliquis (bleeding risk is high in light of low platelets, frequent falls)  2.  Mildly reduced ejection fraction, EF 45 to 50% -Likely tachycardia mediated -Management of a flutter/A. fib as above -May consider outpatient DC cardioversion.  Paracentesis planned in the near future per GI which may require interruption of anticoagulation.  3.  Cirrhosis, abdominal distention, hypertension, frequent falls -Midodrine -On Lasix, Aldactone as per GI  4.  EtOH use -Received Ativan earlier today -Cessation advised -CIWA protocol as per primary team  No additional cardiac testing or intervention planned.  Please continue metoprolol, digoxin and Eliquis as currently prescribed.  Total encounter time 35 minutes  Greater than 50% was spent in counseling and coordination of care with the patient      Signed, Debbe Odea, MD  05/21/2021, 2:06 PM

## 2021-05-21 NOTE — Progress Notes (Signed)
Patient bed alarm alerted, found patient sitting on side of bed.  Patient attempted to get OOB three more times.  Re-oriented patient, assisted patient back in bed.  Bed alarm activated, bed in lowest position and personal belongings within reach.

## 2021-05-21 NOTE — Progress Notes (Signed)
Patient MEWS score RED at 0419 due to HR, SBP, and neuro status.  Patient has been having soft BP's this admission and has been ST on the monitor as well.  MEWS was yellow previously during dayshift due to these vitals.  Patient is now asleep following 3mg  of ativan, and previously was alert & oriented to alert/confused at 2315.  Notified charge RN - and secure message to Dr. Toniann Fail Opyd. Will continue to monitor

## 2021-05-21 NOTE — Progress Notes (Signed)
Lawrence Repressohini R Zaion Hreha, MD [address]  [address]  [place], [place] [address]  Main: [phone]  Fax: [phone] Pager: [phone]   Subjective: No acute events overnight, patient denies any complaints today.  Oxygenating 90% on 2 L  Objective: Vital signs in last 24 hours: Vitals:   05/20/21 2338 05/21/21 0417 05/21/21 0750 05/21/21 1207  BP: 91/61 95/68 106/69 (!) 116/58  Pulse: (!) 120 (!) 116 (!) 109 97  Resp: 20 20 17 18   Temp: 98 F (36.7 C) 98.1 F (36.7 C) 98 F (36.7 C) 97.8 F (36.6 C)  TempSrc:  Axillary    SpO2: 96% 96% 100% 93%  Weight:      Height:       Weight change:   Intake/Output Summary (Last 24 hours) at 05/21/2021 1210 Last data filed at 05/21/2021 0900 Gross per 24 hour  Intake --  Output 800 ml  Net -800 ml     Exam: Heart:: Tachycardia Lungs: clear to auscultation Abdomen: Soft, diffusely distended, dull to percussion, nontender No pedal edema  Lab Results: CBC Latest Ref Rng & Units 05/19/2021 05/18/2021 04/11/2021  WBC 4.0 - 10.5 K/uL 4.2 5.5 5.0  Hemoglobin 13.0 - 17.0 g/dL 12.9(L) 15.2 13.8  Hematocrit 39.0 - 52.0 % 36.4(L) 42.1 40.4  Platelets 150 - 400 K/uL 68(L) 94(L) 76(L)   CMP Latest Ref Rng & Units 05/21/2021 05/19/2021 05/18/2021  Glucose 70 - 99 mg/dL 85 85 474(Q116(H)  BUN 8 - 23 mg/dL 10 8 8   Creatinine 0.61 - 1.24 mg/dL 5.95(G0.57(L) 3.870.71 5.640.82  Sodium 135 - 145 mmol/L 133(L) 135 133(L)  Potassium 3.5 - 5.1 mmol/L 3.7 3.9 4.4  Chloride 98 - 111 mmol/L 97(L) 100 95(L)  CO2 22 - 32 mmol/L 30 30 31   Calcium 8.9 - 10.3 mg/dL 3.3(I8.4(L) 8.1(L) 8.8(L)  Total Protein 6.5 - 8.1 g/dL 9.5(J5.8(L) 8.8(C5.8(L) 6.8  Total Bilirubin 0.3 - 1.2 mg/dL 4.0(H) 2.6(H) 3.5(H)  Alkaline Phos 38 - 126 U/L 115 106 141(H)  AST 15 - 41 U/L 51(H) 39 50(H)  ALT 0 - 44 U/L 19 15 20     Micro Results: Recent Results (from the past 240 hour(s))  Blood culture (routine single)     Status: None (Preliminary result)   Collection Time: 05/18/21  4:02 PM   Specimen: BLOOD   Result Value Ref Range Status   Specimen Description BLOOD BLOOD RIGHT FOREARM  Final   Special Requests   Final    BOTTLES DRAWN AEROBIC AND ANAEROBIC Blood Culture adequate volume   Culture   Final    NO GROWTH 2 DAYS Performed at [facility], [address] [place], [place] [address]    Report Status PENDING  Incomplete  Resp Panel by RT-PCR (Flu A&B, Covid) Nasopharyngeal Swab     Status: None   Collection Time: 05/18/21  4:09 PM   Specimen: Nasopharyngeal Swab; Nasopharyngeal(NP) swabs in vial transport medium  Result Value Ref Range Status   SARS Coronavirus 2 by RT PCR NEGATIVE NEGATIVE Final    Comment: (NOTE) SARS-CoV-2 target nucleic acids are NOT DETECTED.  The SARS-CoV-2 RNA is generally detectable in upper respiratory specimens during the acute phase of infection. The lowest concentration of SARS-CoV-2 viral copies this assay can detect is 138 copies/mL. A negative result does not preclude SARS-Cov-2 infection and should not be used as the sole basis for treatment or other patient management decisions. A negative result may occur with  improper specimen collection/handling, submission of specimen  other than nasopharyngeal swab, presence of viral mutation(s) within the areas targeted by this assay, and inadequate number of viral copies(<138 copies/mL). A negative result must be combined with clinical observations, patient history, and epidemiological information. The expected result is Negative.  Fact Sheet for Patients:  BloggerCourse.com  Fact Sheet for Healthcare Providers:  SeriousBroker.it  This test is no t yet approved or cleared by the [place] FDA and  has been authorized for detection and/or diagnosis of SARS-CoV-2 by FDA under an Emergency Use Authorization (EUA). This EUA will remain  in effect (meaning this test can be used) for the duration of the COVID-19 declaration under Section  564(b)(1) of the Act, 21 U.S.C.section 360bbb-3(b)(1), unless the authorization is terminated  or revoked sooner.       Influenza A by PCR NEGATIVE NEGATIVE Final   Influenza B by PCR NEGATIVE NEGATIVE Final    Comment: (NOTE) The Xpert Xpress SARS-CoV-2/FLU/RSV plus assay is intended as an aid in the diagnosis of influenza from Nasopharyngeal swab specimens and should not be used as a sole basis for treatment. Nasal washings and aspirates are unacceptable for Xpert Xpress SARS-CoV-2/FLU/RSV testing.  Fact Sheet for Patients: BloggerCourse.com  Fact Sheet for Healthcare Providers: SeriousBroker.it  This test is not yet approved or cleared by the [place] FDA and has been authorized for detection and/or diagnosis of SARS-CoV-2 by FDA under an Emergency Use Authorization (EUA). This EUA will remain in effect (meaning this test can be used) for the duration of the COVID-19 declaration under Section 564(b)(1) of the Act, 21 U.S.C. section 360bbb-3(b)(1), unless the authorization is terminated or revoked.  Performed at [facility], [address] [place], [place] [address]    Studies/Results: [place] ASCITES (ABDOMEN LIMITED)  Result Date: 05/19/2021 CLINICAL DATA:  Ascites EXAM: LIMITED ABDOMEN ULTRASOUND FOR ASCITES TECHNIQUE: Limited ultrasound survey for ascites was performed in all four abdominal quadrants. COMPARISON:  CT 05/18/2021 FINDINGS: There is moderate volume abdominal ascites seen in the left upper and lower quadrants. No ascites seen in the right upper or right lower quadrants. IMPRESSION: Moderate volume ascites seen in the left upper and lower quadrants. Electronically Signed   By: Caprice Renshaw M.D.   On: 05/19/2021 14:37   Medications: I have reviewed the patient's current medications. Prior to Admission:  Medications Prior to Admission  Medication Sig Dispense Refill Last Dose   albuterol (PROVENTIL  HFA;VENTOLIN HFA) 108 (90 Base) MCG/ACT inhaler Inhale 2 puffs into the lungs every 6 (six) hours as needed for wheezing or shortness of breath.   05/18/2021 at 1200   apixaban (ELIQUIS) 5 MG TABS tablet Take 1 tablet (5 mg total) by mouth 2 (two) times daily. 60 tablet  05/18/2021 at 1200   Cyanocobalamin (B-12) 2500 MCG TABS Take 2,500 mcg by mouth daily.   05/18/2021 at 200   ferrous gluconate (FERGON) 324 MG tablet Take 324 mg by mouth daily with breakfast.   05/18/2021 at 1200   furosemide (LASIX) 20 MG tablet Take 20 mg by mouth daily as needed for edema.   05/18/2021 at 1200   insulin glargine (LANTUS) 100 UNIT/ML injection Inject 35 Units into the skin 2 (two) times daily.   05/18/2021 at 1200   lactulose (CHRONULAC) 10 GM/15ML solution Take 20 g by mouth 2 (two) times daily.   05/17/2021 at 2100   levothyroxine (SYNTHROID, LEVOTHROID) 100 MCG tablet Take 100 mcg by mouth daily before breakfast.   05/18/2021 at 1200   nadolol (CORGARD) 40  MG tablet Take 2 tablets (80 mg total) by mouth daily. (Patient taking differently: Take 40 mg by mouth daily.)   05/18/2021 at 1200   omega-3 fish oil (MAXEPA) 1000 MG CAPS capsule Take 1,000 mg by mouth 2 (two) times daily.   05/18/2021 at 1200   omeprazole (PRILOSEC) 20 MG capsule Take 20 mg by mouth daily.   05/18/2021 at 1200   QUEtiapine (SEROQUEL) 100 MG tablet Take 100-200 mg by mouth See admin instructions. Take 100 mg in the morning and 200 mg at bedtime   05/18/2021 at 1200   spironolactone (ALDACTONE) 50 MG tablet Take 50 mg by mouth daily.   05/18/2021 at 1200   traMADol (ULTRAM) 50 MG tablet Take 50 mg by mouth 2 (two) times daily.   05/18/2021 at 1200   diltiazem (CARDIZEM CD) 180 MG 24 hr capsule Take 180 mg by mouth at bedtime. (Patient not taking: Reported on 05/18/2021)   Not Taking   HYDROcodone-acetaminophen (NORCO/VICODIN) 5-325 MG tablet Take 1 tablet by mouth every 4 (four) hours as needed for moderate pain. (Patient not taking: No sig reported) 8 tablet 0 Not  Taking   Semaglutide, 1 MG/DOSE, (OZEMPIC, 1 MG/DOSE,) 2 MG/1.5ML SOPN Inject 1 mg into the skin every Sunday. (Patient not taking: Reported on 05/18/2021)   Not Taking   [facility] WORT PO Take 2 tablets by mouth 2 (two) times daily. (Patient not taking: Reported on 05/18/2021)   Not Taking   Scheduled:  apixaban  5 mg Oral BID   digoxin  0.25 mg Oral Daily   folic acid  1 mg Oral Daily   furosemide  20 mg Intravenous BID   insulin aspart  0-9 Units Subcutaneous TID WC   insulin glargine-yfgn  30 Units Subcutaneous BID   lactulose  30 g Oral TID   levothyroxine  100 mcg Oral QAC breakfast   LORazepam  0-4 mg Intravenous Q4H   Followed by   [START ON 05/23/2021] LORazepam  0-4 mg Intravenous Q8H   metoprolol tartrate  25 mg Oral BID   midodrine  10 mg Oral TID WC   rifaximin  550 mg Oral BID   sodium chloride flush  3 mL Intravenous Q12H   thiamine  100 mg Oral Daily   Or   thiamine  100 mg Intravenous Daily   Continuous:  sodium chloride     PRN:sodium chloride, albuterol, LORazepam **OR** LORazepam, sodium chloride flush Anti-infectives (From admission, onward)    Start     Dose/Rate Route Frequency Ordered Stop   05/19/21 1330  rifaximin (XIFAXAN) tablet 550 mg        55 0 mg Oral 2 times daily 05/19/21 1251        Scheduled Meds:  apixaban  5 mg Oral BID   digoxin  0.25 mg Oral Daily   folic acid  1 mg Oral Daily   furosemide  20 mg Intravenous BID   insulin aspart  0-9 Units Subcutaneous TID WC   insulin glargine-yfgn  30 Units Subcutaneous BID   lactulose  30 g Oral TID   levothyroxine  100 mcg Oral QAC breakfast   LORazepam  0-4 mg Intravenous Q4H   Followed by   07/19/21 ON 05/23/2021] LORazepam  0-4 mg Intravenous Q8H   metoprolol tartrate  25 mg Oral BID   midodrine  10 mg Oral TID WC   rifaximin  550 mg Oral BID   sodium chloride flush  3 mL Intravenous Q12H   thiamine  100 mg Oral Daily   Or   thiamine  100 mg Intravenous Daily   Continuous Infusions:  sodium  chloride     PRN Meds:.sodium chloride, albuterol, LORazepam **OR** LORazepam, sodium chloride flush   Assessment: Active Problems:   Type 2 diabetes mellitus with peripheral neuropathy (HCC)   COPD (chronic obstructive pulmonary disease) (HCC)   Hypotension   PAF (paroxysmal atrial fibrillation) (HCC)   Hypothyroidism   Syncope   Thrombocytopenia (HCC)   Alcoholic cirrhosis of liver without ascites (HCC)   Chronic alcohol abuse   Gastroesophageal reflux disease without esophagitis   Acute respiratory failure (HCC)   Acute hepatic encephalopathy   Prolonged QT interval  Decompensated alcoholic cirrhosis, Childs class C, meld sodium 18 is admitted with hypotension, syncopal episode  Plan: Decompensated alcoholic cirrhosis Patient is volume overloaded, ultrasound revealed moderate ascites Appreciate cardiology recommendations, patient is currently on Lasix 20 mg IV twice daily.  Recommend to start spironolactone 100 mg daily to prevent hyponatremia.  Increase midodrine if needed Continue low-sodium diet If abdominal distention is worsening, patient will need therapeutic paracentesis with complete fluid analysis.  Patient is currently on Eliquis, will need to be interrupted for 48 hours before undergoing paracentesis EGD as outpatient for variceal screening, no signs of GI bleed at this time Macrocytic anemia, likely secondary to alcohol abuse and cirrhosis-check iron panel, B12 and folate levels, replete if low Continue rifaximin and lactulose Patient does not want to go to alcohol Anonymous.  He said he will do it on his own Appreciate cardiology recommendations Follow-up with Dr. Tobi Bastos upon discharge to establish care for cirrhosis  GI will sign off at this time, please call us back with questions or concerns   LOS: 3 days   Khup Sapia 05/21/2021, 12:10 PM

## 2021-05-22 ENCOUNTER — Other Ambulatory Visit: Payer: Self-pay

## 2021-05-22 DIAGNOSIS — I483 Typical atrial flutter: Secondary | ICD-10-CM

## 2021-05-22 DIAGNOSIS — R188 Other ascites: Secondary | ICD-10-CM

## 2021-05-22 DIAGNOSIS — F1010 Alcohol abuse, uncomplicated: Secondary | ICD-10-CM

## 2021-05-22 DIAGNOSIS — K7460 Unspecified cirrhosis of liver: Secondary | ICD-10-CM

## 2021-05-22 DIAGNOSIS — J9601 Acute respiratory failure with hypoxia: Secondary | ICD-10-CM | POA: Diagnosis not present

## 2021-05-22 DIAGNOSIS — I420 Dilated cardiomyopathy: Secondary | ICD-10-CM

## 2021-05-22 LAB — DIGOXIN LEVEL: Digoxin Level: 0.7 ng/mL — ABNORMAL LOW (ref 0.8–2.0)

## 2021-05-22 LAB — BASIC METABOLIC PANEL
Anion gap: 6 (ref 5–15)
BUN: 8 mg/dL (ref 8–23)
CO2: 32 mmol/L (ref 22–32)
Calcium: 8.5 mg/dL — ABNORMAL LOW (ref 8.9–10.3)
Chloride: 97 mmol/L — ABNORMAL LOW (ref 98–111)
Creatinine, Ser: 0.56 mg/dL — ABNORMAL LOW (ref 0.61–1.24)
GFR, Estimated: >60 mL/min (ref >60)
Glucose, Bld: 74 mg/dL (ref 70–99)
Potassium: 4.1 mmol/L (ref 3.5–5.1)
Sodium: 135 mmol/L (ref 135–145)

## 2021-05-22 LAB — GLUCOSE, CAPILLARY
Glucose-Capillary: 71 mg/dL (ref 70–99)
Glucose-Capillary: 115 mg/dL — ABNORMAL HIGH (ref 70–99)
Glucose-Capillary: 112 mg/dL — ABNORMAL HIGH (ref 70–99)
Glucose-Capillary: 92 mg/dL (ref 70–99)

## 2021-05-22 NOTE — Progress Notes (Signed)
Progress Note  Patient Name: Lawrence Osborn Date of Encounter: 05/22/2021  [facility] Cardiologist: [facility]  Subjective   Reports feeling relatively well, Abdomen less distended Reports abdomen significantly distended 3 weeks ago, improved with diuretics Telemetry reviewed, atrial fibrillation rate 100 bpm at rest  Ultrasound results reviewed with him, moderate ascites Tolerating IV Lasix twice daily, spironolactone  Reports having paracentesis in the past  Inpatient Medications    Scheduled Meds:  apixaban  5 mg Oral BID   digoxin  0.25 mg Oral Daily   folic acid  1 mg Oral Daily   furosemide  20 mg Intravenous BID   insulin aspart  0-9 Units Subcutaneous TID WC   insulin glargine-yfgn  30 Units Subcutaneous BID   lactulose  30 g Oral TID   levothyroxine  100 mcg Oral QAC breakfast   LORazepam  0-4 mg Intravenous Q4H   Followed by   Melene Muller ON 05/23/2021] LORazepam  0-4 mg Intravenous Q8H   metoprolol tartrate  25 mg Oral BID   midodrine  10 mg Oral TID WC   rifaximin  550 mg Oral BID   sodium chloride flush  3 mL Intravenous Q12H   spironolactone  25 mg Oral Daily   thiamine  100 mg Oral Daily   Or   thiamine  100 mg Intravenous Daily   Continuous Infusions:  sodium chloride     PRN Meds: sodium chloride, albuterol, LORazepam **OR** LORazepam, sodium chloride flush   Vital Signs    Vitals:   05/22/21 0004 05/22/21 0348 05/22/21 0540 05/22/21 0747  BP: (!) 131/102 117/84  (!) 108/55  Pulse: 100 (!) 109 98 (!) 116  Resp: 20 18  15   Temp: 98.2 F (36.8 C) 98.6 F (37 C)  97.6 F (36.4 C)  TempSrc:      SpO2: 95% 93%  96%  Weight:   114 kg   Height:        Intake/Output Summary (Last 24 hours) at 05/22/2021 1103 Last data filed at 05/22/2021 0353 Gross per 24 hour  Intake --  Output 160 ml  Net -160 ml   Last 3 Weights 05/22/2021 05/19/2021 05/18/2021  Weight (lbs) 251 lb 5.2 oz 260 lb 6.4 oz 274 lb  Weight (kg) 114 kg 118.117 kg 124.286 kg       Telemetry    Atrial fibrillation rate 104 bpm- Personally Reviewed  ECG    - Personally Reviewed  Physical Exam   GEN: No acute distress.   Neck: Unable to estimate JVD Cardiac: Irregularly irregular no murmurs, rubs, or gallops.  Respiratory: Clear to auscultation bilaterally. GI: Soft, nontender, distended MS: No edema; No deformity. Neuro:  Nonfocal  Psych: Normal affect   Labs    High Sensitivity Troponin:   Recent Labs  Lab 05/18/21 1602 05/18/21 1759  TROPONINIHS 10 9      Chemistry Recent Labs  Lab 05/18/21 1602 05/19/21 0410 05/21/21 0559 05/22/21 0546  NA 133* 135 133* 135  K 4.4 3.9 3.7 4.1  CL 95* 100 97* 97*  CO2 31 30 30  32  GLUCOSE 116* 85 85 74  BUN 8 8 10 8   CREATININE 0.82 0.71 0.57* 0.56*  CALCIUM 8.8* 8.1* 8.4* 8.5*  PROT 6.8 5.8* 5.8*  --   ALBUMIN 2.5* 2.4* 2.4*  --   AST 50* 39 51*  --   ALT 20 15 19   --   ALKPHOS 141* 106 115  --   BILITOT 3.5* 2.6* 4.0*  --  GFRNONAA >60 >60 >60 >60  ANIONGAP 7 5 6 6      Hematology Recent Labs  Lab 05/18/21 1602 05/19/21 0410  WBC 5.5 4.2  RBC 4.18* 3.55*  HGB 15.2 12.9*  HCT 42.1 36.4*  MCV 100.7* 102.5*  MCH 36.4* 36.3*  MCHC 36.1* 35.4  RDW 13.8 13.9  PLT 94* 68*    BNP Recent Labs  Lab 05/18/21 1602  BNP 318.3*     DDimer No results for input(s): DDIMER in the last 168 hours.   Radiology    No results found.  Cardiac Studies   Echo  1. Left ventricular ejection fraction, by estimation, is 45 to 50%. The  left ventricle has mildly decreased function. Left ventricular endocardial  border not optimally defined to evaluate regional wall motion. Left  ventricular diastolic parameters are  indeterminate.   2. Right ventricular systolic function is mildly reduced. The right  ventricular size is mildly enlarged. There is normal pulmonary artery  systolic pressure.   3. Right atrial size was moderately dilated.   4. The mitral valve is normal in structure. No  evidence of mitral valve  regurgitation. No evidence of mitral stenosis.   5. The aortic valve is normal in structure. Aortic valve regurgitation is  not visualized. Mild to moderate aortic valve sclerosis/calcification is  present, without any evidence of aortic stenosis.   6. The inferior vena cava is normal in size with greater than 50%  respiratory variability, suggesting right atrial pressure of 3 mmHg.   7. Challenging image quality.   Patient Profile     70 y.o. male with history of atrial fibs/atrial flutter, alcohol induced cirrhosis, presenting with frequent falls, found to be in atrial fibrillation with rapid ventricular response  Assessment & Plan    Permanent atrial flutter with rapid ventricular response Atrial flutter dating back to December 2019, rate typically 95-1 10 Likely contributing to recurrent syncope on arrival exacerbated by hypotension and tachycardia CTA negative for PE Rate improving, unclear if alcohol withdrawal contributing -Continue Lopressor 25 twice daily, digoxin 0.25 daily -If patient is withdrawing from EtOH, this might make tachycardia worse. -Continue therapeutic Eliquis (bleeding risk is high in light of low platelets, frequent falls).  If paracentesis indicated will need to hold 2 days at least -Appears to be asymptomatic from current atrial flutter rate -Potentially might be able to tolerate metoprolol tartrate 37.5 twice daily, will continue to trend his blood pressure    2.  Mildly reduced ejection fraction, EF 45 to 50% Possibly tachycardia mediated -Management of a flutter as above, Rate control, would likely be challenging to obtain rhythm control as he has been in atrial flutter for at least 3 years   3.  Cirrhosis, abdominal distention, hypertension, frequent falls Followed by GI, on Lasix IV twice daily with Aldactone for ascites -Midodrine 10 mg 3 times daily He reports abdomen getting softer with diuresis   4.  EtOH use On  lorazepam sliding scale -CIWA protocol as per primary team  Long discussion with him concerning her alcohol history, history of flutter, recent syncope  Total encounter time more than 35 minutes  Greater than 50% was spent in counseling and coordination of care with the patient   For questions or updates, please contact [facility] Please consult [email or web address] for contact info under        Signed, 05-14-1981, MD  05/22/2021, 11:03 AM

## 2021-05-22 NOTE — Progress Notes (Signed)
PROGRESS NOTE    Lawrence Osborn  WUJ:[id number] DOB: 1950-11-08 DOA: 05/18/2021 PCP: Danella PentonMiller, Mark F, MD    Brief Narrative:  Lawrence Osborn is a 70 y.o. male with medical history significant of Alcoholic cirrhosis of liver with ascites, Hepatic encephalopathy syndrome, hypothyrodism, bilateral PE, COPD, Dm2, A.fib, HLD, SLE,   peripheral neuropathy, CAD     Presented with   syncope lasting 30 seconds he was getting out of his car and fell out and hit concrete.  Hit the front of his head. Did not want to come with EMS initially.  He finally did agree to come because he was still feeling somewhat unsteady chronic diarrhea has been using lactulose Notes peripheral edema Now drinks 2 bears a day In the ED he was found with A. fib flutter.  Cardiology was consulted.  He is placed on CIWA protocol.  He has been withdrawing on the protocol.  Also on Seroquel at home but QTC prolonged and has been held here. He has moderate ascites.  Was started on IV Lasix and Aldactone per GI recommendation.  Also found with CT angiography is recommended to evaluate for proximal subclavian artery stenosis. Consider vascular consult in am.  Now treating for his edema/ascitis. His HR.    Consultants:  GI, cards  Procedures:   Antimicrobials:      Subjective: Awake today. Has no complaints of sob, cp, abd pain. More pleasant this am.   Objective: Vitals:   05/22/21 0004 05/22/21 0348 05/22/21 0540 05/22/21 0747  BP: (!) 131/102 117/84  (!) 108/55  Pulse: 100 (!) 109 98 (!) 116  Resp: 20 18  15   Temp: 98.2 F (36.8 C) 98.6 F (37 C)  97.6 F (36.4 C)  TempSrc:      SpO2: 95% 93%  96%  Weight:   114 kg   Height:        Intake/Output Summary (Last 24 hours) at 05/22/2021 0900 Last data filed at 05/22/2021 0353 Gross per 24 hour  Intake --  Output 160 ml  Net -160 ml    Filed Weights   05/18/21 1538 05/19/21 2100 05/22/21 0540  Weight: 124.3 kg 118.1 kg 114 kg    Examination: Nad,  calm Decrease breath sounds, no wheezing  Irreg, s1/s2 mildly tachy. No gallop Soft distended nontender positive bowel sounds +edema, but less Awake and alert, grossly intact Mood and affect appropriate in current setting    Data Reviewed: I have personally reviewed following labs and imaging studies  CBC: Recent Labs  Lab 05/18/21 1602 05/19/21 0410  WBC 5.5 4.2  NEUTROABS  --  2.6  HGB 15.2 12.9*  HCT 42.1 36.4*  MCV 100.7* 102.5*  PLT 94* 68*   Basic Metabolic Panel: Recent Labs  Lab 05/18/21 1602 05/18/21 1759 05/19/21 0410 05/21/21 0559 05/22/21 0546  NA 133*  --  135 133* 135  K 4.4  --  3.9 3.7 4.1  CL 95*  --  100 97* 97*  CO2 31  --  30 30 32  GLUCOSE 116*  --  85 85 74  BUN 8  --  8 10 8   CREATININE 0.82  --  0.71 0.57* 0.56*  CALCIUM 8.8*  --  8.1* 8.4* 8.5*  MG 1.8  --  1.5* 1.8  --   PHOS  --  4.0 4.3  --   --    GFR: Estimated Creatinine Clearance: 108.3 mL/min (A) (by C-G formula based on SCr of 0.56 mg/dL (L)). Liver Function  Tests: Recent Labs  Lab 05/18/21 1602 05/19/21 0410 05/21/21 0559  AST 50* 39 51*  ALT 20 15 19   ALKPHOS 141* 106 115  BILITOT 3.5* 2.6* 4.0*  PROT 6.8 5.8* 5.8*  ALBUMIN 2.5* 2.4* 2.4*   No results for input(s): LIPASE, AMYLASE in the last 168 hours. Recent Labs  Lab 05/18/21 1602 05/19/21 0409  AMMONIA 97* 77*   Coagulation Profile: Recent Labs  Lab 05/18/21 1602  INR 1.7*   Cardiac Enzymes: Recent Labs  Lab 05/18/21 1759  CKTOTAL 46*   BNP (last 3 results) No results for input(s): PROBNP in the last 8760 hours. HbA1C: No results for input(s): HGBA1C in the last 72 hours.  CBG: Recent Labs  Lab 05/21/21 0753 05/21/21 1209 05/21/21 1621 05/21/21 2113 05/22/21 0748  GLUCAP 79 93 90 109* 71   Lipid Profile: No results for input(s): CHOL, HDL, LDLCALC, TRIG, CHOLHDL, LDLDIRECT in the last 72 hours. Thyroid Function Tests: No results for input(s): TSH, T4TOTAL, FREET4, T3FREE, THYROIDAB  in the last 72 hours.  Anemia Panel: Recent Labs    05/20/21 1218  VITAMINB12 519  FOLATE 25.0  FERRITIN 204  TIBC 199*  IRON 138   Sepsis Labs: Recent Labs  Lab 05/18/21 1602 05/18/21 1759 05/18/21 2123 05/19/21 0003  LATICACIDVEN 2.6* 2.8* 2.8* 2.1*    Recent Results (from the past 240 hour(s))  Blood culture (routine single)     Status: None (Preliminary result)   Collection Time: 05/18/21  4:02 PM   Specimen: BLOOD  Result Value Ref Range Status   Specimen Description BLOOD BLOOD RIGHT FOREARM  Final   Special Requests   Final    BOTTLES DRAWN AEROBIC AND ANAEROBIC Blood Culture adequate volume   Culture   Final    NO GROWTH 2 DAYS Performed at [facility], [address] [place], [place]    Report Status PENDING  Incomplete  Resp Panel by RT-PCR (Flu A&B, Covid) Nasopharyngeal Swab     Status: None   Collection Time: 05/18/21  4:09 PM   Specimen: Nasopharyngeal Swab; Nasopharyngeal(NP) swabs in vial transport medium  Result Value Ref Range Status   SARS Coronavirus 2 by RT PCR NEGATIVE NEGATIVE Final    Comment: (NOTE) SARS-CoV-2 target nucleic acids are NOT DETECTED.  The SARS-CoV-2 RNA is generally detectable in upper respiratory specimens during the acute phase of infection. The lowest concentration of SARS-CoV-2 viral copies this assay can detect is 138 copies/mL. A negative result does not preclude SARS-Cov-2 infection and should not be used as the sole basis for treatment or other patient management decisions. A negative result may occur with  improper specimen collection/handling, submission of specimen other than nasopharyngeal swab, presence of viral mutation(s) within the areas targeted by this assay, and inadequate number of viral copies(<138 copies/mL). A negative result must be combined with clinical observations, patient history, and epidemiological information. The expected result is Negative.  Fact Sheet for Patients:   07/18/21  Fact Sheet for Healthcare Providers:  BloggerCourse.com  This test is no t yet approved or cleared by the [employer] FDA and  has been authorized for detection and/or diagnosis of SARS-CoV-2 by FDA under an Emergency Use Authorization (EUA). This EUA will remain  in effect (meaning this test can be used) for the duration of the COVID-19 declaration under Section 564(b)(1) of the Act, 21 U.S.C.section 360bbb-3(b)(1), unless the authorization is terminated  or revoked sooner.       Influenza A by PCR  NEGATIVE NEGATIVE Final   Influenza B by PCR NEGATIVE NEGATIVE Final    Comment: (NOTE) The Xpert Xpress SARS-CoV-2/FLU/RSV plus assay is intended as an aid in the diagnosis of influenza from Nasopharyngeal swab specimens and should not be used as a sole basis for treatment. Nasal washings and aspirates are unacceptable for Xpert Xpress SARS-CoV-2/FLU/RSV testing.  Fact Sheet for Patients: BloggerCourse.com  Fact Sheet for Healthcare Providers: SeriousBroker.it  This test is not yet approved or cleared by the [place] FDA and has been authorized for detection and/or diagnosis of SARS-CoV-2 by FDA under an Emergency Use Authorization (EUA). This EUA will remain in effect (meaning this test can be used) for the duration of the COVID-19 declaration under Section 564(b)(1) of the Act, 21 U.S.C. section 360bbb-3(b)(1), unless the authorization is terminated or revoked.  Performed at [facility], [address], [place], [place] [address]          Radiology Studies: No results found.      Scheduled Meds:  apixaban  5 mg Oral BID   digoxin  0.25 mg Oral Daily   folic acid  1 mg Oral Daily   furosemide  20 mg Intravenous BID   insulin aspart  0-9 Units Subcutaneous TID WC   insulin glargine-yfgn  30 Units Subcutaneous BID   lactulose   30 g Oral TID   levothyroxine  100 mcg Oral QAC breakfast   LORazepam  0-4 mg Intravenous Q4H   Followed by   Melene Muller ON 05/23/2021] LORazepam  0-4 mg Intravenous Q8H   metoprolol tartrate  25 mg Oral BID   midodrine  10 mg Oral TID WC   rifaximin  550 mg Oral BID   sodium chloride flush  3 mL Intravenous Q12H   spironolactone  25 mg Oral Daily   thiamine  100 mg Oral Daily   Or   thiamine  100 mg Intravenous Daily   Continuous Infusions:  sodium chloride      Assessment & Plan:   Active Problems:   Type 2 diabetes mellitus with peripheral neuropathy (HCC)   COPD (chronic obstructive pulmonary disease) (HCC)   Hypotension   PAF (paroxysmal atrial fibrillation) (HCC)   Hypothyroidism   Syncope   Thrombocytopenia (HCC)   Alcoholic cirrhosis of liver without ascites (HCC)   Chronic alcohol abuse   Gastroesophageal reflux disease without esophagitis   Acute respiratory failure (HCC)   Acute hepatic encephalopathy   Prolonged QT interval   69 y.o. male with medical history significant of Alcoholic cirrhosis of liver with ascites, Hepatic encephalopathy syndrome, hypothyrodism, bilateral PE, COPD, Dm2, A.fib, HLD, SLE,   peripheral neuropathy, CAD admitted with syncope, A. fib flutter.  Syncope -possibly secondary to transient hypotension from  afib rvr  CT negative for PE Echo with EF of 45 to 50%.  LV endocardial border not optimally defined to evaluate regional wall motion. Troponin negative Carotid ultrasound no significant ICA stenosis bilaterally.  Findings for possible subclavian steal syndrome.  CT angiography is recommended to evaluate for proximal subclavian artery stenosis. 9/5- on midodrine BP on low to normotensive Probably still needs control Would consider vascular surgery consult for evaluation of proximal subclavian artery stenosis on a.m.   Afib /flutter rvr- On monitor with fib /flutter. On eliquis 9/3 -heart rate still not controlled well Echo with LV  dysfunction as noted above Cards input was appreciated, although may be risky with his current falls they recommend keeping him on anticoagulation given significant elevated CHA2DS2-VASc score.  Plan try rate control, loaded with oral dig and then maintenance 0.125 once daily Possible cardioversion at some point in the near future can be considered as outpatient setting 9/5 heart rate still not controlled.  Cardiology following we will continue with Eliquis.  Reimbursement is need to hold it for 2 days at least.   Potentially may tolerate titrating metoprolol to 37.5 twice daily ...>will monitor bp to see trend Continue digoxin    Cardiomyopathy- tachy mediated v.s. alcohol induced Volume overloaded Will need to totally abstain from alcohol use Need afib/flutter rate control v.s. Rhythm...>cards will decide long term plan Continue midodrine for BP support titration of meds 9/5  echo EF 45-50% continue iv lasix , I/o, daily wt On Aldactone for his cirrhosis Continue beta-blockers as above Cardiology following         Decompensated alcoholic cirrhosis  Since hypotensive spironolactone held  Attempted gentle diuresing given anasarca on admission  Counseled about alcohol cessation but he does not appear to be interested to quit his 2-12 ounce beers daily  GI consulted -input appreciated. Recommend avoiding NSAIDs.  Hold off on Lasix and Aldactone as he does not have ascites and he came in hypotensive 9/3 abdominal ultrasound revealed moderate ascites  GI recommends low-dose diuretics and spironolactone if okay by cardiology  If abdominal distention is worsening patient will need therapeutic paracentesis with complete fluid analysis  Currently on Eliquis will need to be interrupted for 48 hours before going paracentesis  EGD as outpatient for variceal screening, no signs of GI bleed at this time  Possibly macrocytic anemia due to alcohol abuse and cirrhosis -we will check iron panel,  B12 and folate levels replete if low  Continue rifaximin and lactulose  Patient not interested in going to alcohol Anonymous he would like to try on his own  9/5 tolerating lasix and aldactone Will need to follow-up Dr. Tobi Bastos please discharge status care for cirrhosis GI signed off at this time call with questions   Hypomagnesemia Supplemented and stable    Alcohol abuse- he reports now he is only drinking 2 12oz beer daily and doesn't appear to want to stop despite cousneling him 9/5 scored 3 last night, no ativan given Continue CIWA protocal     Acute respiratory failure (HCC) thought to be possibly secondary to fluid overload in the emergency department received albumin if blood pressure Now on lasix Wean down 02 as tolerated.    Type 2 diabetes mellitus with peripheral neuropathy (HCC) -  -  BG stable Continue R-ISS      Thrombocytopenia (HCC) likely secondary to liver disease      Hypothyroidism continue Synthroid    Hypotension transient at baseline systolics in the 110 chronically tachycardic    Gastroesophageal reflux disease without esophagitis PPI    COPD (chronic obstructive pulmonary disease) (HCC) currently appears to be stable albuterol as needed        Acute hepatic encephalopathy -continue with lactulose repeat ammonia level  MS improved.    Diarrhea -seems that loose stools much worse than his baseline.   Stool study pending?  None reported now   Prolonged qt - - will monitor on tele avoid QT prolonging medications, rehydrate correct electrolytes Hold seroquel. (Explained this to wife) Cards following     DVT prophylaxis: Eliquis Code Status: Full Family Communication: None at bedside Disposition Plan:  Status is: Inpatient  Remains inpatient appropriate because:Inpatient level of care appropriate due to severity of illness  Dispo: The patient is from:  Home              Anticipated d/c is to: SNF- pt wold like to think about it.,  likely will not go per wife              Patient currently is not medically stable to d/c.   Difficult to place patient No    Getting IV Lasix and being managed for heart rate control for his A. fib flutter.  May need paracentesis if his ascites does not improve.  GI signed off but can call if you have questions.  Cardiology following.  He is still tachycardic.        LOS: 4 days   Time spent: 35 minutes with more than 50% on COC    Lynn Ito, MD [facility] Pager [phone]  If 7PM-7AM, please contact night-coverage 05/22/2021, 9:00 AM

## 2021-05-22 NOTE — TOC Initial Note (Signed)
Transition of Care [facility]) - Initial/Assessment Note    Patient Details  Name: Lawrence Osborn MRN: [id number] Date of Birth: 26-Sep-1950  Transition of Care [facility]) CM/SW Contact:    Alberteen Sam, LCSW Phone Number: 05/22/2021, 12:29 PM  Clinical Narrative:                  CSW met with patient at bedside to review SNF recommendations from PT/OT. Patient reports he would like to work up to going home, and is declining SNF at this time. Would be agreeable to home health services. Reports he has a walker at home and does not think he needs a 3in1, reports his wife helps him a great deal at home. Does not currently have home O2, noted to be on O2 in room. TOC will continue to follow for potential oxygen needs at dc.   [facility] has accepted patient for PT. OT and RN. No DME needs identified a this time.   TOC to continue to follow for discharge planning.    Expected Discharge Plan: [place] Barriers to Discharge: Continued Medical Work up   Patient Goals and CMS Choice Patient states their goals for this hospitalization and ongoing recovery are:: to go home CMS Medicare.gov Compare Post Acute Care list provided to:: Patient Choice offered to / list presented to : Patient  Expected Discharge Plan and Services Expected Discharge Plan: [place] Choice: [place] arrangements for the past 2 months: Single Family Home                           HH Arranged: PT, OT, RN [facility] Agency: [place] ([place]) Date HH Agency Contacted: 05/22/21 Time [place]: 1228 Representative spoke with at [place]: Corene Cornea  Prior Living Arrangements/Services Living arrangements for the past 2 months: [place] with:: Spouse Patient language and need for interpreter reviewed:: Yes Do you feel safe going back to the place where you live?: Yes      Need for Family Participation in Patient Care:  Yes (Comment) Care giver support system in place?: Yes (comment)   Criminal Activity/Legal Involvement Pertinent to Current Situation/Hospitalization: No - Comment as needed  Activities of Daily Living Home Assistive Devices/Equipment: None ADL Screening (condition at time of admission) Patient's cognitive ability adequate to safely complete daily activities?: Yes Is the patient deaf or have difficulty hearing?: No Does the patient have difficulty seeing, even when wearing glasses/contacts?: No Does the patient have difficulty concentrating, remembering, or making decisions?: No Patient able to express need for assistance with ADLs?: Yes Does the patient have difficulty dressing or bathing?: No Independently performs ADLs?: Yes (appropriate for developmental age) Does the patient have difficulty walking or climbing stairs?: No Weakness of Legs: None Weakness of Arms/Hands: None  Permission Sought/Granted Permission sought to share information with : Case Manager, Customer service manager, Family Supports Permission granted to share information with : Yes, Verbal Permission Granted     Permission granted to share info w AGENCY: HH        Emotional Assessment Appearance:: Appears stated age Attitude/Demeanor/Rapport: Gracious Affect (typically observed): Calm Orientation: : Oriented to Self, Oriented to Place, Oriented to  Time, Oriented to Situation Alcohol / Substance Use: Not Applicable Psych Involvement: No (comment)  Admission diagnosis:  Acute respiratory failure (HCC) [J96.00] Syncope and collapse [R55] Syncope [R55] Lactic  acid acidosis [E87.2] Acute respiratory failure with hypoxia (HCC) [J96.01] Ascites [R18.8] Syncope, unspecified syncope type [R55] Hepatic cirrhosis, unspecified hepatic cirrhosis type, unspecified whether ascites present [facility]) [K74.60] Patient Active Problem List   Diagnosis Date Noted   Prolonged QT interval 05/19/2021   Atrial flutter  ([place])    Acute respiratory failure ([place]) 05/18/2021   Acute hepatic encephalopathy 05/18/2021   Atrial fibrillation, chronic (HCC)    Alcoholic cirrhosis of liver without ascites ([place])    Sepsis ([place]) 07/30/2019   Toe infection    Cellulitis of left lower extremity    Elevated lactic acid level    Chronic alcohol abuse 09/11/2018   Acute deep vein thrombosis (DVT) of proximal vein of right lower extremity (HCC)    Acute pulmonary embolism (HCC)    Thrombocytopenia (HCC)    Elevated d-dimer 08/31/2018   COPD (chronic obstructive pulmonary disease) ([place]) 08/30/2018   Alcohol use 08/30/2018   Hypotension 08/30/2018   CAD (coronary artery disease) 08/30/2018   PAF (paroxysmal atrial fibrillation) ([place]) 08/30/2018   Hypothyroidism 08/30/2018   Syncope 08/30/2018   Type 2 diabetes mellitus with peripheral neuropathy ([place]) 07/30/2018   H/O herpes zoster 07/26/2017   Lumbar disc disease 07/26/2017   Recurrent major depressive disorder, in partial remission ([place]) 07/16/2016   Thoracic radiculopathy 07/16/2016   Pain management contract agreement 03/26/2016   Benign essential tremor 08/21/2015   Chronic left-sided low back pain 08/21/2015   Depression 08/21/2015   Gastroesophageal reflux disease without esophagitis 08/21/2015   PCP:  Rusty Aus, MD Pharmacy:   [facility] [place], [place] AT [facility] OF [address] [place] [address] Phone: [phone] Fax: [phone]     Social Determinants of Health ([place]) Interventions    Readmission Risk Interventions Readmission Risk Prevention Plan 07/31/2019  Transportation Screening Complete  PCP or Specialist Appt within 5-7 Days Complete  Home Care Screening Complete  Medication Review (RN CM) Complete  Some recent data might be hidden

## 2021-05-22 NOTE — Progress Notes (Signed)
Pt cardiac rhythm is a-fib, which is why HR is elevated.  Pt was previously yellow, providers are aware.

## 2021-05-23 DIAGNOSIS — I4891 Unspecified atrial fibrillation: Secondary | ICD-10-CM

## 2021-05-23 DIAGNOSIS — I5023 Acute on chronic systolic (congestive) heart failure: Secondary | ICD-10-CM

## 2021-05-23 DIAGNOSIS — I4892 Unspecified atrial flutter: Secondary | ICD-10-CM | POA: Diagnosis not present

## 2021-05-23 LAB — CBC
HCT: 39.3 % (ref 39.0–52.0)
Hemoglobin: 14.1 g/dL (ref 13.0–17.0)
MCH: 35.8 pg — ABNORMAL HIGH (ref 26.0–34.0)
MCHC: 35.9 g/dL (ref 30.0–36.0)
MCV: 99.7 fL (ref 80.0–100.0)
Platelets: 65 10*3/uL — ABNORMAL LOW (ref 150–400)
RBC: 3.94 MIL/uL — ABNORMAL LOW (ref 4.22–5.81)
RDW: 13.5 % (ref 11.5–15.5)
WBC: 4.3 10*3/uL (ref 4.0–10.5)
nRBC: 0 % (ref 0.0–0.2)

## 2021-05-23 LAB — BASIC METABOLIC PANEL
Anion gap: 9 (ref 5–15)
BUN: 8 mg/dL (ref 8–23)
CO2: 34 mmol/L — ABNORMAL HIGH (ref 22–32)
Calcium: 8.6 mg/dL — ABNORMAL LOW (ref 8.9–10.3)
Chloride: 91 mmol/L — ABNORMAL LOW (ref 98–111)
Creatinine, Ser: 0.46 mg/dL — ABNORMAL LOW (ref 0.61–1.24)
GFR, Estimated: >60 mL/min (ref >60)
Glucose, Bld: 90 mg/dL (ref 70–99)
Potassium: 3.7 mmol/L (ref 3.5–5.1)
Sodium: 134 mmol/L — ABNORMAL LOW (ref 135–145)

## 2021-05-23 LAB — GLUCOSE, CAPILLARY
Glucose-Capillary: 95 mg/dL (ref 70–99)
Glucose-Capillary: 135 mg/dL — ABNORMAL HIGH (ref 70–99)

## 2021-05-23 LAB — CULTURE, BLOOD (SINGLE)
Culture: NO GROWTH
Special Requests: ADEQUATE

## 2021-05-23 MED ORDER — FUROSEMIDE 40 MG PO TABS: 40.0000 mg | ORAL_TABLET | Freq: Two times a day (BID) | ORAL | Status: DC

## 2021-05-23 MED ORDER — DIGOXIN 250 MCG PO TABS: 0.2500 mg | ORAL_TABLET | Freq: Every day | ORAL | 0 refills | Status: DC

## 2021-05-23 MED ORDER — PREDNISONE 10 MG PO TABS: 10.0000 mg | ORAL_TABLET | Freq: Every day | ORAL | 0 refills | Status: AC

## 2021-05-23 MED ORDER — FUROSEMIDE 20 MG PO TABS: 40.0000 mg | ORAL_TABLET | Freq: Two times a day (BID) | ORAL | 0 refills | Status: AC

## 2021-05-23 MED ORDER — METOPROLOL TARTRATE 25 MG PO TABS: 25.0000 mg | ORAL_TABLET | Freq: Two times a day (BID) | ORAL | 0 refills | Status: DC

## 2021-05-23 MED ORDER — QUETIAPINE FUMARATE 100 MG PO TABS: 50.0000 mg | ORAL_TABLET | Freq: Every day | ORAL | Status: AC

## 2021-05-23 MED ORDER — MIDODRINE HCL 10 MG PO TABS: 10.0000 mg | ORAL_TABLET | Freq: Three times a day (TID) | ORAL | 0 refills | Status: DC

## 2021-05-23 MED ORDER — TORSEMIDE 20 MG PO TABS: 40.0000 mg | ORAL_TABLET | Freq: Once | ORAL | Status: DC

## 2021-05-23 NOTE — Discharge Summary (Signed)
Physician Discharge Summary  Patient ID: Lawrence Osborn MRN: [id number] DOB/AGE: 1950-09-21 70 y.o.  Admit date: 05/18/2021 Discharge date: 05/23/2021  Admission Diagnoses:  Discharge Diagnoses:  Active Problems:   Type 2 diabetes mellitus with peripheral neuropathy (HCC)   COPD (chronic obstructive pulmonary disease) (HCC)   Hypotension   PAF (paroxysmal atrial fibrillation) (HCC)   Hypothyroidism   Syncope   Thrombocytopenia (HCC)   Alcoholic cirrhosis of liver without ascites (HCC)   Chronic alcohol abuse   Gastroesophageal reflux disease without esophagitis   Acute respiratory failure (HCC)   Acute hepatic encephalopathy   Prolonged QT interval   Discharged Condition: good  Hospital Course:  Lawrence Osborn is a 70 y.o. male with medical history significant of Alcoholic cirrhosis of liver with ascites, Hepatic encephalopathy syndrome, hypothyrodism, bilateral PE, COPD, Dm2, A.fib, HLD, SLE,   peripheral neuropathy, CAD, presented with   syncope. Work-up showed ascites, small amount of pleural effusion chest x-ray, mild elevation of BNP.  Echocardiogram showed ejection fraction 45 to 50%.  Patient was seen by cardiology, started IV Lasix for acute on chronic systolic congestive heart failure. Patient also had atrial fibrillation with rapid ventricle response, was started on digoxin and beta-blocker.  Eliquis was continued.  #1.  Paroxysmal atrial fibrillation with rapid rate response. Acute on chronic systolic congestive heart failure with ejection fraction 45 to 50%. Syncope secondary to atrial fibrillation with RVR. Transient hypotension secondary to rapid atrial fibrillation. Patient received IVs Lasix, volume status much better.  Renal function still stable.  Patient also treated with digoxin and beta-blocker.  Heart rate is better controlled. Discussed with cardiology, patient is medically stable to be discharged.  We will continue digoxin and beta-blocker, patient will need to  follow-up with a digoxin level, BMP and magnesium in the next office visit. Patient be also followed by cardiology in the near future. Continue Eliquis for stroke prevention.  #2.  Alcohol use disorder. Decompensated alcoholic liver cirrhosis. Thrombocytopenia secondary to liver cirrhosis. Hepatic encephalopathy Patient has been evaluated by GI, continue diuretics. Patient be also followed by Dr. Tobi Bastos as outpatient. Ultrasound showed a moderate amount of ascites, did not perform paracentesis. Patient may need outpatient paracentesis if condition getting worse, will continue Lasix and Aldactone. Resume home dose lactulose. Patient needs to hold Eliquis for at least 48 hours before paracentesis is performed.  #3.  Acute hypoxemic respiratory failure secondary to acute on chronic congestive heart failure. She has improved, currently patient off oxygen.  4.  Type 2 diabetes. Resume all home medicines.  5.  Skin rashes. Patient developed some skin rash since arriving the hospital.  Etiology still unclear, could be drug reaction.  I opt to treat him with 3 days of lower dose prednisone.  I will hold off rifaximin.  If the rash is getting worse, may consider discontinue digoxin.    Consults: cardiology  Significant Diagnostic Studies:  Echo: Left ventricular ejection fraction, by estimation, is 45 to 50%. The left ventricle has mildly decreased function. Left ventricular endocardial border not optimally defined to evaluate regional wall motion. Left ventricular diastolic parameters are indeterminate. 1. Right ventricular systolic function is mildly reduced. The right ventricular size is mildly enlarged. There is normal pulmonary artery systolic pressure. 2. 3. Right atrial size was moderately dilated. The mitral valve is normal in structure. No evidence of mitral valve regurgitation. No evidence of mitral stenosis. 4. The aortic valve is normal in structure. Aortic valve  regurgitation is not visualized. Mild to moderate aortic valve  sclerosis/calcification is present, without any evidence of aortic stenosis. 5. The inferior vena cava is normal in size with greater than 50% respiratory variability, suggesting right atrial pressure of 3 mmHg. 6. 7. Challenging image quality.  CT  IMPRESSION: 1. No acute intracranial abnormality. 2. 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 3.  No acute traumatic injury to the chest, abdomen, or pelvis.   4. No acute fracture or traumatic malalignment of the thoracic or lumbar spine.   Other imaging findings of potential clinical significance:   1. No pulmonary embolus. 2. Small to moderate volume left pleural effusion. 3. Cirrhosis with portal hypertension. No focal hepatic lesion on this single-phase portal venous study. Recommend nonemergent MRI liver protocol for further evaluation. When the patient is clinically stable and able to follow directions and hold their breath (preferably as an outpatient) further evaluation with dedicated abdominal MRI should be considered. 4. Cholelithiasis no findings of acute cholecystitis. 5. Aortic Atherosclerosis (ICD10-I70.0). Including at least 3 vessel coronary artery calcifications as well as aortic valve leaflet calcifications. Correlate with aortic stenosis.     Electronically Signed   By: Tish Frederickson M.D.   On: 05/18/2021 18:37    Treatments: IV lasix  Discharge Exam: Blood pressure 103/78, pulse (!) 108, temperature 97.8 F (36.6 C), resp. rate 18, height 5\' 8"  (1.727 m), weight 114 kg, SpO2 93 %. General appearance: alert and cooperative Resp: clear to auscultation bilaterally Cardio: regular rate and rhythm, S1, S2 normal, no murmur, click, rub or gallop GI: soft, distended, none tender Extremities: extremities normal,  atraumatic, no cyanosis or edema  Disposition: Discharge disposition: 01-Home or Self Care       Discharge Instructions     Diet - low sodium heart healthy   Complete by: As directed    Dys 3 diet   Increase activity slowly   Complete by: As directed       Allergies as of 05/23/2021   No Known Allergies      Medication List     STOP taking these medications    diltiazem 180 MG 24 hr capsule Commonly known as: CARDIZEM CD   nadolol 40 MG tablet Commonly known as: CORGARD       TAKE these medications    albuterol 108 (90 Base) MCG/ACT inhaler Commonly known as: VENTOLIN HFA Inhale 2 puffs into the lungs every 6 (six) hours as needed for wheezing or shortness of breath.   apixaban 5 MG Tabs tablet Commonly known as: ELIQUIS Take 1 tablet (5 mg total) by mouth 2 (two) times daily.   B-12 2500 MCG Tabs Take 2,500 mcg by mouth daily.   digoxin 0.25 MG tablet Commonly known as: LANOXIN Take 1 tablet (0.25 mg total) by mouth daily. Start taking on: May 24, 2021   ferrous gluconate 324 MG tablet Commonly known as: FERGON Take 324 mg by mouth daily with breakfast.   furosemide 20 MG tablet Commonly known as: LASIX Take 2 tablets (40 mg total) by mouth 2 (two) times daily. What changed:  how much to take when to take this reasons to take this   HYDROcodone-acetaminophen 5-325 MG tablet Commonly known as: NORCO/VICODIN Take 1 tablet by mouth every 4 (four) hours as needed for moderate pain.   insulin glargine 100 UNIT/ML injection Commonly known as: LANTUS Inject 35 Units into the skin 2 (two) times daily.   lactulose  10 GM/15ML solution Commonly known as: CHRONULAC Take 20 g by mouth 2 (two) times daily.   levothyroxine 100 MCG tablet Commonly known as: SYNTHROID Take 100 mcg by mouth daily before breakfast.   metoprolol tartrate 25 MG tablet Commonly known as: LOPRESSOR Take 1 tablet (25 mg total) by mouth 2 (two) times daily.    midodrine 10 MG tablet Commonly known as: PROAMATINE Take 1 tablet (10 mg total) by mouth 3 (three) times daily with meals.   omega-3 fish oil 1000 MG Caps capsule Commonly known as: MAXEPA Take 1,000 mg by mouth 2 (two) times daily.   omeprazole 20 MG capsule Commonly known as: PRILOSEC Take 20 mg by mouth daily.   Ozempic (1 MG/DOSE) 2 MG/1.5ML Sopn Generic drug: Semaglutide (1 MG/DOSE) Inject 1 mg into the skin every Sunday.   predniSONE 10 MG tablet Commonly known as: DELTASONE Take 1 tablet (10 mg total) by mouth daily for 4 days.   QUEtiapine 100 MG tablet Commonly known as: SEROQUEL Take 0.5 tablets (50 mg total) by mouth at bedtime. Take 100 mg in the morning and 200 mg at bedtime What changed:  how much to take when to take this   spironolactone 50 MG tablet Commonly known as: ALDACTONE Take 50 mg by mouth daily.   ST JOHNS WORT PO Take 2 tablets by mouth 2 (two) times daily.   traMADol 50 MG tablet Commonly known as: ULTRAM Take 50 mg by mouth 2 (two) times daily.        Follow-up Information     Miller, Mark F, MD Follow up in 1 week(s).   Specialty: Internal Medicine Contact information: [address] [facility] [place] [address] [phone]         Arida, Muhammad A, MD Follow up in 1 week(s).   Specialty: Cardiology Contact information: [address] [place] [address] [phone]         Anna, Kiran, MD Follow up in 1 week(s).   Specialty: Gastroenterology Contact information: [address] [place] [address] [phone]                34  minutes Signed: Randye Treichler 05/23/2021, 2:30 PM

## 2021-05-23 NOTE — Progress Notes (Signed)
Progress Note  Patient Name: Lawrence Osborn Date of Encounter: 05/23/2021  Primary Cardiologist:   Subjective   No CP or SOB.  No dizziness.  No feelings of racing heart rate.    Inpatient Medications    Scheduled Meds:  apixaban  5 mg Oral BID   digoxin  0.25 mg Oral Daily   folic acid  1 mg Oral Daily   furosemide  20 mg Intravenous BID   insulin aspart  0-9 Units Subcutaneous TID WC   insulin glargine-yfgn  30 Units Subcutaneous BID   lactulose  30 g Oral TID   levothyroxine  100 mcg Oral QAC breakfast   LORazepam  0-4 mg Intravenous Q8H   metoprolol tartrate  25 mg Oral BID   midodrine  10 mg Oral TID WC   rifaximin  550 mg Oral BID   sodium chloride flush  3 mL Intravenous Q12H   spironolactone  25 mg Oral Daily   thiamine  100 mg Oral Daily   Or   thiamine  100 mg Intravenous Daily   Continuous Infusions:  sodium chloride     PRN Meds: sodium chloride, albuterol, LORazepam **OR** LORazepam, sodium chloride flush   Vital Signs    Vitals:   05/22/21 2336 05/23/21 0406 05/23/21 0758 05/23/21 1119  BP: 115/84 110/79 107/69 103/78  Pulse: 88 97 (!) 109 (!) 108  Resp: 16 16 20 18   Temp: (!) 97.1 F (36.2 C) 97.9 F (36.6 C) 98.3 F (36.8 C) 97.8 F (36.6 C)  TempSrc:   Oral   SpO2: 98% 98% 93% 93%  Weight:      Height:        Intake/Output Summary (Last 24 hours) at 05/23/2021 1241 Last data filed at 05/23/2021 1030 Gross per 24 hour  Intake 520 ml  Output 300 ml  Net 220 ml   Last 3 Weights 05/22/2021 05/19/2021 05/18/2021  Weight (lbs) 251 lb 5.2 oz 260 lb 6.4 oz 274 lb  Weight (kg) 114 kg 118.117 kg 124.286 kg      Telemetry    Telemetry leads currently off patient, earlier atrial fibrillation/flutter- Personally Reviewed  ECG    No new tracings- Personally Reviewed  Physical Exam   GEN: No acute distress.  Eating breakfast Neck: JVD difficult to assess due to body habitus Cardiac: IRIR tachycardic, no murmurs, rubs, or gallops.  Respiratory:  Clear to auscultation bilaterally. GI: Obese, firm, distended  MS: No edema; No deformity. Neuro:  Nonfocal  Psych: Normal affect   Labs    High Sensitivity Troponin:   Recent Labs  Lab 05/18/21 1602 05/18/21 1759  TROPONINIHS 10 9      Chemistry Recent Labs  Lab 05/18/21 1602 05/19/21 0410 05/21/21 0559 05/22/21 0546 05/23/21 0520  NA 133* 135 133* 135 134*  K 4.4 3.9 3.7 4.1 3.7  CL 95* 100 97* 97* 91*  CO2 31 30 30  32 34*  GLUCOSE 116* 85 85 74 90  BUN 8 8 10 8 8   CREATININE 0.82 0.71 0.57* 0.56* 0.46*  CALCIUM 8.8* 8.1* 8.4* 8.5* 8.6*  PROT 6.8 5.8* 5.8*  --   --   ALBUMIN 2.5* 2.4* 2.4*  --   --   AST 50* 39 51*  --   --   ALT 20 15 19   --   --   ALKPHOS 141* 106 115  --   --   BILITOT 3.5* 2.6* 4.0*  --   --   GFRNONAA >60 >60 >  60 >60 >60  ANIONGAP 7 5 6 6 9      Hematology Recent Labs  Lab 05/18/21 1602 05/19/21 0410 05/23/21 0520  WBC 5.5 4.2 4.3  RBC 4.18* 3.55* 3.94*  HGB 15.2 12.9* 14.1  HCT 42.1 36.4* 39.3  MCV 100.7* 102.5* 99.7  MCH 36.4* 36.3* 35.8*  MCHC 36.1* 35.4 35.9  RDW 13.8 13.9 13.5  PLT 94* 68* 65*    BNP Recent Labs  Lab 05/18/21 1602  BNP 318.3*     DDimer No results for input(s): DDIMER in the last 168 hours.   Radiology    No results found.  Cardiac Studies   Echo  1. Left ventricular ejection fraction, by estimation, is 45 to 50%. The  left ventricle has mildly decreased function. Left ventricular endocardial  border not optimally defined to evaluate regional wall motion. Left  ventricular diastolic parameters are  indeterminate.   2. Right ventricular systolic function is mildly reduced. The right  ventricular size is mildly enlarged. There is normal pulmonary artery  systolic pressure.   3. Right atrial size was moderately dilated.   4. The mitral valve is normal in structure. No evidence of mitral valve  regurgitation. No evidence of mitral stenosis.   5. The aortic valve is normal in structure.  Aortic valve regurgitation is  not visualized. Mild to moderate aortic valve sclerosis/calcification is  present, without any evidence of aortic stenosis.   6. The inferior vena cava is normal in size with greater than 50%  respiratory variability, suggesting right atrial pressure of 3 mmHg.   7. Challenging image quality.     Patient Profile     70 y.o. male with history of afib/atrial flutter, newly reduced EF 45-50%, alcohol induced cirrhosis, frequent falls, and seen for recent falls with LOC and afib/flutter with RVR.  Assessment & Plan    Permanent Atrial fibrillation / flutter with RVR -- Asx in Afib/flutter, dx 08/2018. Presented after 2 falls with reported LOC and during which time he hit his head. 2019 admission with similar presentation. Etiology of fall suspected as 2/2 RVR and hypotension. CTA without PE.  He does report continued alcohol use at 2 beers per day.    --Continue current rate control.  Rate difficult to control with flutter and withdrawal from EtOH. Up-titration of lopressor limited by soft BP. Digoxin 0.25mg  qd restarted with digoxin level 0.7 on 9/5. Amiodarone not recommended given his cirrhosis.  --Continued on OAC, though as previously noted, he is at elevated risk for bleeding given EtOH, falls, low plts. If paracentesis, will need to hold for at least 2 days. Current CHA2DS2VASc score of at least 5 (HFpEF, hypertension, age, DM2, vascular).  --No current plan at this time for DCCV this admission, though at presentation, it was recommended this be considered at a later date and even though he has been in Afib/flutter since 2019 as no previous attempt at DCCV and concern for tachycardia induced CM.  HFrEF, newly reduced EF  -- No SOB. EF 45-50%. Suspected as possibly 2/2 tachycardia induced CM. CT did show 3V CAC and further workup with MPI could be considered as OP. Volume up at presentation with consideration of abdominal distention 2/2 ascites and improving with  GI consulted. Volume status has significantly improved s/p IV diuresis. He is now on oral diuretics. --Continue torsemide and spironolactone.  --Daily BMET. Monitor I/Os. Maintain electrolytes at goal.  --Due to low BP with diuresis, he was started on midodrine 10mg  TID. Will continue  for now given ongoing soft BP and falls.  --Continue management of cirrhosis / ascites per GI.  --Consider future DCCV as outlined above.  CAC by CT  -No reported chest pain.  CT shows 3V CAD.  EKG without acute changes.  High-sensitivity troponin 10, 9.  EF reduced as above, suspected as 2/2 tachycardia.  He does have risk factors for CAD.  Could consider outpatient stress in the future - defer for now. Aggressive risk factor modification recommended. No plan for invasive ischemic work-up this admission.    Falls/ Reported Syncope x4 --Suspect syncope 2/2 hypotension and RVR. CTA negative for PE.  Electrolytes /TSH wnl, EKG without acute changes, HS Tn minimally elevated, flat trending. Carotids without significant stenosis but recommended CT to further evaluate for subclavian steal syndrome. Head imaging without acute findings. Could consider Zio at discharge though suspect falls likely 2/2 RVR and hypotension with ongoing EtOH use / withdrawal.      Ongoing alcohol use --Complete cessation recommended.  For questions or updates, please contact [facility] Please consult [email or web address] for contact info under        Signed, Lennon Alstrom, PA-C  05/23/2021, 12:41 PM

## 2021-05-23 NOTE — Progress Notes (Signed)
Physical Therapy Treatment Patient Details Name: Lawrence Osborn MRN: [id number] DOB: 01-29-1951 Today's Date: 05/23/2021    History of Present Illness Pt is a 70 y.o. male presenting to hospital 9/1 with loss of consciousness getting out of his car.  Pt reports 4-5 falls in last 2 days d/t syncope.  Pt admitted with acute respiratory failure, syncope, diarrhea, and acute hepatic encephalopathy.  PMH includes anemia, COPD, DM, htn, alcoholic cirrhosis, CAD, PAF on Eliquis, DVT, pneumothorax, PE, and peripheral neuropathy.    PT Comments    Pt resting in bed upon PT arrival; pt's wife present; pt agreeable to PT session.  SBA semi-supine to sitting edge of bed; CGA with transfers; and CGA ambulating 120 feet and then 100 feet with RW (limited distance d/t therapist pacing pt).  Pt's HR 73-91 bpm during ambulation mostly but HR elevated at rest (beginning/end of session and between ambulation trials)--HR fluctuating between 101-124 bpm at rest (nurse notified of pt's HR during session).  Pt's mobility improving; PT follow up recommendations updated to HHPT (TOC notified).  Will continue to focus on strengthening, balance, and progressive functional mobility per pt tolerance.   Follow Up Recommendations  Home health PT     Equipment Recommendations  Rolling walker with 5" wheels;3in1 (PT);Wheelchair (measurements PT);Wheelchair cushion (measurements PT)    Recommendations for Other Services OT consult     Precautions / Restrictions Precautions Precautions: Fall Precaution Comments: monitor HR Restrictions Weight Bearing Restrictions: No    Mobility  Bed Mobility Overal bed mobility: Needs Assistance Bed Mobility: Supine to Sit     Supine to sit: Supervision;HOB elevated     General bed mobility comments: mild increased effort to perform on own    Transfers Overall transfer level: Needs assistance Equipment used: Rolling walker (2 wheeled) Transfers: Sit to/from [employer] Sit to Stand: Min guard Stand pivot transfers: Min guard (stand step turn bed to recliner with RW)       General transfer comment: x1 trial from bed and x1 trial from recliner; vc's for UE placement  Ambulation/Gait Ambulation/Gait assistance: Min guard;+2 safety/equipment (close chair follow for safety) Gait Distance (Feet):  (120 feet; 100 feet) Assistive device: Rolling walker (2 wheeled)   Gait velocity: decreased   General Gait Details: partial step through gait pattern; steady with RW use   Stairs             Wheelchair Mobility    Modified Rankin (Stroke Patients Only)       Balance Overall balance assessment: Needs assistance Sitting-balance support: No upper extremity supported;Feet supported Sitting balance-Leahy Scale: Good Sitting balance - Comments: steady sitting reaching within BOS   Standing balance support: Single extremity supported Standing balance-Leahy Scale: Fair Standing balance comment: steady standing with at least single UE support                            Cognition Arousal/Alertness: Awake/alert Behavior During Therapy: WFL for tasks assessed/performed Overall Cognitive Status: Within Functional Limits for tasks assessed                                        Exercises General Exercises - Lower Extremity Ankle Circles/Pumps: AROM;Strengthening;Both;10 reps;Seated Long Arc Quad: AROM;Strengthening;Both;10 reps;Seated Hip Flexion/Marching: AROM;Strengthening;Both;10 reps;Seated Shoulder Exercises Shoulder Flexion: AROM;Strengthening;Both;10 reps;Seated (to 90 degrees) Elbow Flexion: AROM;Strengthening;Both;10 reps;Seated Elbow Extension: AROM;Strengthening;Both;10 reps;Seated  General Comments  Nursing cleared pt for participation in physical therapy.  Pt agreeable to PT session.      Pertinent Vitals/Pain Pain Assessment: No/denies pain O2 sats 90-91% at rest beginning of session but  93% at rest end of session (on room air).    Home Living                      Prior Function            PT Goals (current goals can now be found in the care plan section) Acute Rehab PT Goals Patient Stated Goal: to not fall PT Goal Formulation: With patient Time For Goal Achievement: 06/02/21 Potential to Achieve Goals: Fair Progress towards PT goals: Progressing toward goals    Frequency    Min 2X/week      PT Plan Discharge plan needs to be updated    Co-evaluation              AM-PAC PT "6 Clicks" Mobility   Outcome Measure  Help needed turning from your back to your side while in a flat bed without using bedrails?: A Little Help needed moving from lying on your back to sitting on the side of a flat bed without using bedrails?: A Little Help needed moving to and from a bed to a chair (including a wheelchair)?: A Little Help needed standing up from a chair using your arms (e.g., wheelchair or bedside chair)?: A Little Help needed to walk in hospital room?: A Little Help needed climbing 3-5 steps with a railing? : A Lot 6 Click Score: 17    End of Session Equipment Utilized During Treatment: Gait belt Activity Tolerance: Patient tolerated treatment well Patient left: in chair;with call bell/phone within reach;with chair alarm set;with family/visitor present Nurse Communication: Mobility status;Precautions;Other (comment) (pt's HR during session) PT Visit Diagnosis: Other abnormalities of gait and mobility (R26.89);Muscle weakness (generalized) (M62.81);Difficulty in walking, not elsewhere classified (R26.2)     Time: 1914-7829 PT Time Calculation (min) (ACUTE ONLY): 50 min  Charges:  $Gait Training: 8-22 mins $Therapeutic Exercise: 8-22 mins $Therapeutic Activity: 8-22 mins                    Hendricks Limes, PT 05/23/21, 10:33 AM

## 2021-05-23 NOTE — Care Management Important Message (Signed)
Important Message  Patient Details  Name: Lawrence Osborn MRN: [id number] Date of Birth: [birth year]   Medicare Important Message Given:  Yes     Olegario Messier A [name] 05/23/2021, 3:19 PM

## 2021-05-24 ENCOUNTER — Telehealth: Payer: Self-pay

## 2021-05-24 NOTE — Telephone Encounter (Signed)
   Patients wife was contacted regarding discharge from hospital stay (05/18/21-05/23/21)  Patients wife understands to follow up with provider ? On  05/30/21 at [facility]. Patients wife understands discharge instructions? Yes  Patients wife understands medications and regiment? Yes Patients wife understands to bring all medications to this visit? Yes   A BMP and Digoxin Level will be drawn at time of visit.  Patients wife stated that patient has not had swelling since being home. He is mostly sleeping. Has ate some, but remains very tired.  She was grateful for the follow up call.

## 2021-05-24 NOTE — Telephone Encounter (Signed)
-----   Message from Gibson Ramp, RN sent at 05/24/2021  1:37 PM EDT ----- Regarding: FW: TCM  ----- Message ----- From: Dalia Heading Sent: 05/24/2021  12:20 PM EDT To: Mickie Bail [facility] Triage Subject: RE: TCM                                        9/13 TCM Appt with JV ----- Message ----- From: Lennon Alstrom, PA-C Sent: 05/23/2021   2:33 PM EDT To: Yvonne Kendall, MD, [facility] Triage, # Subject: TCM                                            Hello,  This patient was admitted and seen at [facility] for Afib/flutter and newly reduced EF.  We are expecting discharge today 9/6.  Can you please (1) Schedule for follow-up TCM appointment with Dr. Kirke Corin or APP within the next 1-2  weeks?   (2) Order BMET and digoxin level to occur in the next 1 week (at the medical mall if not able to get into clinic for labs within the next week).  Thank you! Signed, Lennon Alstrom, PA-C 05/23/2021, 2:29 PM Pager [phone]

## 2021-05-30 ENCOUNTER — Other Ambulatory Visit: Payer: Self-pay

## 2021-05-30 ENCOUNTER — Ambulatory Visit (INDEPENDENT_AMBULATORY_CARE_PROVIDER_SITE_OTHER): Payer: Medicare Other | Admitting: Physician Assistant

## 2021-05-30 ENCOUNTER — Encounter: Payer: Self-pay | Admitting: Physician Assistant

## 2021-05-30 VITALS — BP 90/60 | HR 106 | Ht 68.0 in | Wt 233.0 lb

## 2021-05-30 DIAGNOSIS — E039 Hypothyroidism, unspecified: Secondary | ICD-10-CM

## 2021-05-30 DIAGNOSIS — I4892 Unspecified atrial flutter: Secondary | ICD-10-CM

## 2021-05-30 DIAGNOSIS — Z79899 Other long term (current) drug therapy: Secondary | ICD-10-CM | POA: Diagnosis not present

## 2021-05-30 DIAGNOSIS — I4891 Unspecified atrial fibrillation: Secondary | ICD-10-CM

## 2021-05-30 DIAGNOSIS — I5022 Chronic systolic (congestive) heart failure: Secondary | ICD-10-CM | POA: Diagnosis not present

## 2021-05-30 DIAGNOSIS — R079 Chest pain, unspecified: Secondary | ICD-10-CM | POA: Diagnosis not present

## 2021-05-30 DIAGNOSIS — K7030 Alcoholic cirrhosis of liver without ascites: Secondary | ICD-10-CM

## 2021-05-30 DIAGNOSIS — R0681 Apnea, not elsewhere classified: Secondary | ICD-10-CM

## 2021-05-30 DIAGNOSIS — E1169 Type 2 diabetes mellitus with other specified complication: Secondary | ICD-10-CM

## 2021-05-30 DIAGNOSIS — Z9181 History of falling: Secondary | ICD-10-CM

## 2021-05-30 DIAGNOSIS — Z87898 Personal history of other specified conditions: Secondary | ICD-10-CM

## 2021-05-30 DIAGNOSIS — Z789 Other specified health status: Secondary | ICD-10-CM

## 2021-05-30 DIAGNOSIS — Z794 Long term (current) use of insulin: Secondary | ICD-10-CM

## 2021-05-30 MED ORDER — DIGOXIN 125 MCG PO TABS: 0.1250 mg | ORAL_TABLET | Freq: Every day | ORAL | 5 refills | Status: DC

## 2021-05-30 NOTE — Patient Instructions (Signed)
Medication Instructions:   Your physician has recommended you make the following change in your medication:   DECREASE Digoxin 0.125 mg daily  *If you need a refill on your cardiac medications before your next appointment, please call your pharmacy*   Lab Work:  Your physician recommends that you return for lab work in: 1-2 weeks    - Digoxin Level   Testing/Procedures: [facility] MYOVIEW  Your caregiver has ordered a Chartered certified accountant) Stress Test with nuclear imaging. The purpose of this test is to evaluate the blood supply to your heart muscle. This procedure is referred to as a "Non-Invasive Stress Test." This is because other than having an IV started in your vein, nothing is inserted or "invades" your body. Cardiac stress tests are done to find areas of poor blood flow to the heart by determining the extent of coronary artery disease (CAD). Some patients exercise on a treadmill, which naturally increases the blood flow to your heart, while others who are  unable to walk on a treadmill due to physical limitations have a pharmacologic/chemical stress agent called Lexiscan . This medicine will mimic walking on a treadmill by temporarily increasing your coronary blood flow.   Please note: these test may take anywhere between 2-4 hours to complete  PLEASE REPORT TO [facility] ENTRANCE  THE VOLUNTEERS AT THE FIRST DESK WILL DIRECT YOU WHERE TO GO  Date of Procedure:_____________________________________  Arrival Time for Procedure:______________________________  Instructions regarding medication:   __X__ : Take HALF dose of insulin the EVENING prior and HOLD all diabetes medication including insulin the morning of procedure  __X__:  Hold betablocker(s) night before procedure and morning of procedure: Metoprolol  __X__:  Hold other medications as follows: Do Not take Furosemide the morning of procedure   PLEASE NOTIFY THE OFFICE AT LEAST 24 HOURS IN ADVANCE IF YOU ARE UNABLE TO  KEEP YOUR APPOINTMENT.  [phone] AND  PLEASE NOTIFY NUCLEAR MEDICINE AT [facility] AT LEAST 24 HOURS IN ADVANCE IF YOU ARE UNABLE TO KEEP YOUR APPOINTMENT. [phone]  How to prepare for your Myoview test:  Do not eat or drink after midnight No caffeine for 24 hours prior to test No smoking 24 hours prior to test. Your medication may be taken with water.  If your doctor stopped a medication because of this test, do not take that medication. Please wear a short sleeve shirt. No perfume, cologne or lotion.    Follow-Up: At [facility], you and your health needs are our priority.  As part of our continuing mission to provide you with exceptional heart care, we have created designated Provider Care Teams.  These Care Teams include your primary Cardiologist (physician) and Advanced Practice Providers (APPs -  Physician Assistants and Nurse Practitioners) who all work together to provide you with the care you need, when you need it.  We recommend signing up for the patient portal called "MyChart".  Sign up information is provided on this After Visit Summary.  MyChart is used to connect with patients for Virtual Visits (Telemedicine).  Patients are able to view lab/test results, encounter notes, upcoming appointments, etc.  Non-urgent messages can be sent to your provider as well.   To learn more about what you can do with MyChart, go to [email or web address].    Your next appointment:    Follow up after Lexiscan  The format for your next appointment:   In Person  Provider:   You may see Dr. Kirke Corin or one of the following Advanced Practice  Providers on your designated Care Team:   Nicolasa Ducking, NP Eula Listen, PA-C Marisue Ivan, PA-C Cadence Fransico Michael, [place]   Other Instructions  You have been referred to Electrophysiology.

## 2021-05-30 NOTE — Progress Notes (Signed)
Office Visit    Patient Name: Lawrence Osborn Date of Encounter: 05/30/2021  PCP:  Rusty Aus, MD   [place]  Cardiologist:  Dr. Fletcher Anon Advanced Practice Provider:  No care team member to display Electrophysiologist:  None [phone]   Chief Complaint    Chief Complaint  Patient presents with   Other    F/u Hosp. Afib/Syncope. Pt is fall risk unable to stand for weight or orthos today. Meds reviewed verbally with pt.    70 y.o. 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  Past Medical History    Past Medical History:  Diagnosis Date   Anemia    Cirrhosis of liver (HCC)    COPD (chronic obstructive pulmonary disease) ([place])    Diabetes mellitus without complication (HCC)    DVT (deep venous thrombosis) ([place])    mid calf to groin   GI bleed    Hypertension    Peritonitis ([place])    Pneumothorax    Past Surgical History:  Procedure Laterality Date   APPENDECTOMY     COLONOSCOPY     DORSAL SLIT N/A 09/20/2020   Procedure: DORSAL SLIT;  Surgeon: Abbie Sons, MD;  Location: [facility] ORS;  Service: Urology;  Laterality: N/A;   HERNIA REPAIR      Allergies  No Known Allergies  History of Present Illness    Lawrence Osborn is a 70 y.o. male with PMH as above.  He has history of asymptomatic atrial fibrillation on anticoagulation with alcohol use/frequent falls.  He also has history of alcohol liver cirrhosis and frequent diarrhea on lactulose for liver cirrhosis/hepatic encephalopathy. In 2019, he was seen by [facility] at [facility] for weakness and falls.  EF 50 to 55%. Carotids with mild stenosis.  It was thought that the etiology of his fall could be transient atrial fibrillation with RVR and dehydration. He was continued on nadolol in digoxin.  Recommendation was to discontinue anticoagulation given  heavy alcohol intake/falls; however, he continued on anticoagulation.   On 05/18/2021, he was admitted to [facility] after 4 syncopal episodes and found to be in atrial flutter with RVR and acute HFrEF with EF 45 to 50%.   During 2 of the falls, he struck the front and back of his head, though head CT negative. Carotid US without significant stenosis but possible proximal subclavian artery stenosis/suspicious for subclavian steal syndrome with recommendation for CTA.  US abdomen showed moderate ascites.  CTA negative for PE and showed moderate left pleural effusion and cirrhosis, cholelithiasis, aortic atherosclerosis, 3V CAC, AV calcification with possible AS.  He was IV diuresed during admission with improvement in his symptoms.  He was he was discharged on furosemide 40 mg BID, spironolactone 25 mg qd, digoxin 0.25 mg qd, metoprolol 25 mg BID, midodrine, and apixaban.    9/12 labs at his PCP showed digoxin 1.1 ng/mL, K 3.6, creatinine 0.7, BUN 15, AST 49, ALT 23, albumin 3.4, bilirubin 3.4, alk phosphatase 130, magnesium 1.6, hemoglobin 15.5, hematocrit 43.2, platelet count 89, RBC 4.46.  Today, 05/30/2021, he is seen in clinic for hospital follow-up.  He is joined today by his wife, who provides the majority of the information during today's visit/speaks for the patient for most of the visit.  She indicates significant concern for his health.  Since released from the  hospital, she reports to seizure episodes.  It was recommended this be discussed with the patient's PCP and referral to neurology offered as well.  Also discussed was that repeat head imaging may be indicated to rule out a slow head bleed/SAH, particularly given she reports a change in personality since the events as described directly above.  She reports that her husband's speech and energy has been significantly affected.  He has lost a lot of weight since discharge.  She has noted some apnea episodes as well.  She is concerned that he will fall  during PT and politely declines both PT/OT.  She also does not want a nurse.  Concern today expressed regarding anticoagulation and falls.  She reports that he has gotten a 4 wheeled walker to help him ambulate around the house.  In terms of his atrial fibrillation/flutter, he remains asymptomatic.  He reports some chest pain, described more as a tightness.  He is unable to provide further details regarding the pain, though he does say this is new for him.  No shortness of breath.  He continues to note significant improvement in his abdominal distention and lower extremity edema since he was IV diuresed during his admission.  He denies any recent presyncope or syncope.  No recent falls.  No signs or symptoms of bleeding.  He reports compliance with his medications.  He has cut down to 1 beer per day.  His wife reports that she is managing his medications and monitoring his BP, heart rate, and pressure each day.  They wonder about his digoxin, given his recent digoxin level as above.   Home Medications   Current Outpatient Medications  Medication Instructions   albuterol (PROVENTIL HFA;VENTOLIN HFA) 108 (90 Base) MCG/ACT inhaler 2 puffs, Inhalation, Every 6 hours PRN   apixaban (ELIQUIS) 5 mg, Oral, 2 times daily   B-12 2,500 mcg, Oral, Daily   digoxin (LANOXIN) 0.25 mg, Oral, Daily   ferrous gluconate (FERGON) 324 mg, Oral, Daily with breakfast   furosemide (LASIX) 40 mg, Oral, 2 times daily   HYDROcodone-acetaminophen (NORCO/VICODIN) 5-325 MG tablet 1 tablet, Oral, Every 4 hours PRN   insulin glargine (LANTUS) 35 Units, Subcutaneous, 2 times daily   lactulose (CHRONULAC) 20 g, Oral, 2 times daily   levothyroxine (SYNTHROID) 100 mcg, Oral, Daily before breakfast   metoprolol tartrate (LOPRESSOR) 25 mg, Oral, 2 times daily   midodrine (PROAMATINE) 10 mg, Oral, 3 times daily with meals   omega-3 fish oil (MAXEPA) 1,000 mg, Oral, 2 times daily   omeprazole (PRILOSEC) 20 mg, Oral, Daily   Ozempic (1  MG/DOSE) 1 mg, Subcutaneous, Every Sun   QUEtiapine (SEROQUEL) 50 mg, Oral, Daily at bedtime, Take 100 mg in the morning and 200 mg at bedtime   spironolactone (ALDACTONE) 50 mg, Oral, Daily   [place] PO 2 tablets, Oral, 2 times daily   traMADol (ULTRAM) 50 mg, Oral, 2 times daily     Review of Systems    He reports chest pain, PND/apnea.  His wife reports weight loss and that her husband has been slow to respond and seems more fatigued and less like himself.  She also reports 2 seizure episodes.  He does admit to fatigue.  He reports improved abdominal distention and lower extremity edema. He denies palpitations, dyspnea, orthopnea, n, v, dizziness, syncope, weight gain, or early satiety. All other systems reviewed and are otherwise negative except as noted above.  Physical Exam    VS:  BP 90/60 (  BP Location: Right Arm, Patient Position: Sitting, Cuff Size: Large)   Pulse (!) 106   Ht _0  (1.727 m)   Wt 233 lb (105.7 kg)   SpO2 95%   BMI 35.43 kg/m  , BMI Body mass index is 35.43 kg/m. GEN: Well nourished, well developed, in no acute distress.  Joined by his wife. HEENT: normal. Neck: Supple, no carotid bruits, or masses.  JVD difficult to assess due to body habitus. Cardiac: Tachycardic, IRIR, 1/6 systolic murmur.  No rubs, or gallops. No clubbing, cyanosis, or pitting edema.  Radials/DP/PT 2+ and equal bilaterally.  Respiratory: Reduced breath sounds bilaterally. GI: nontender, slightly distended, BS + x 4. MS: no deformity or atrophy. Skin: warm and dry, no rash. Neuro:  Strength and sensation are intact. Psych: Normal affect.  Accessory Clinical Findings    ECG personally reviewed by me today -atrial flutter with variable AV block, RBBB, ventricular rate 106 bpm- no acute changes.  VITALS Reviewed today   Temp Readings from Last 3 Encounters:  05/23/21 97.8 F (36.6 C)  04/11/21 98.1 F (36.7 C) (Oral)  09/20/20 97.7 F (36.5 C) (Oral)   BP Readings from  Last 3 Encounters:  05/30/21 90/60  05/23/21 103/78  04/11/21 106/76   Pulse Readings from Last 3 Encounters:  05/30/21 (!) 106  05/23/21 (!) 108  04/11/21 (!) 107    Wt Readings from Last 3 Encounters:  05/30/21 233 lb (105.7 kg)  05/22/21 251 lb 5.2 oz (114 kg)  04/11/21 288 lb 1.6 oz (130.7 kg)     LABS  reviewed today   Lab Results  Component Value Date   WBC 4.3 05/23/2021   HGB 14.1 05/23/2021   HCT 39.3 05/23/2021   MCV 99.7 05/23/2021   PLT 65 (L) 05/23/2021   Lab Results  Component Value Date   CREATININE 0.46 (L) 05/23/2021   BUN 8 05/23/2021   NA 134 (L) 05/23/2021   K 3.7 05/23/2021   CL 91 (L) 05/23/2021   CO2 34 (H) 05/23/2021   Lab Results  Component Value Date   ALT 19 05/21/2021   AST 51 (H) 05/21/2021   ALKPHOS 115 05/21/2021   BILITOT 4.0 (H) 05/21/2021   No results found for: CHOL, HDL, LDLCALC, LDLDIRECT, TRIG, CHOLHDL  Lab Results  Component Value Date   HGBA1C 5.1 05/18/2021   Lab Results  Component Value Date   TSH 4.994 (H) 05/19/2021     STUDIES/PROCEDURES reviewed today   Echo 05/19/21  1. Left ventricular ejection fraction, by estimation, is 45 to 50%. The  left ventricle has mildly decreased function. Left ventricular endocardial  border not optimally defined to evaluate regional wall motion. Left  ventricular diastolic parameters are  indeterminate.   2. Right ventricular systolic function is mildly reduced. The right  ventricular size is mildly enlarged. There is normal pulmonary artery  systolic pressure.   3. Right atrial size was moderately dilated.   4. The mitral valve is normal in structure. No evidence of mitral valve  regurgitation. No evidence of mitral stenosis.   5. The aortic valve is normal in structure. Aortic valve regurgitation is  not visualized. Mild to moderate aortic valve sclerosis/calcification is  present, without any evidence of aortic stenosis.   6. The inferior vena cava is normal in size with  greater than 50%  respiratory variability, suggesting right atrial pressure of 3 mmHg.   7. Challenging image quality.   US Carotids 05/18/21 IMPRESSION: 1. Less than 50%  ICA stenosis bilaterally. 2. Retrograde appearance of flow in the left vertebral artery suspicious for subclavian steal syndrome. CT angiography is recommended to evaluate for possible proximal subclavian artery stenosis.  08/2018 Echo - Left ventricle: Systolic function was normal. The estimated    ejection fraction was in the range of 50% to 55%.  - Aortic valve: Valve area (VTI): 1.36 cm^2. Valve area (Vmax):    1.47 cm^2. Valve area (Vmean): 1.49 cm^2.  - Left atrium: The atrium was mildly dilated.  - Right ventricle: The cavity size was moderately dilated. Systolic    pressure was increased.  - Right atrium: The atrium was mildly dilated.  - Tricuspid valve: There was moderate regurgitation.  - Pulmonary arteries: PA peak pressure: 51 mm Hg (S).   Assessment & Plan    Permanent atrial fibrillation/flutter with RVR --Remains in atrial flutter with RVR today and denies tachypalpitations but does have a history of falls as above. Recent echo as above with EF 45-50%.  --Medications: Reduce to digoxin 0.125 given 9/12 elevated level with repeat labs in ~2w. Continue current dose BB as up-titration limited by low BP.  Continue OAC - evaluate risks versus benefits of [place] at each RTC. CHA2DS2-VASc score of at least 5 (HFpEF, HTN, age, DM2, vascular). No missed doses. No recent falls. No reported s/x of bleeding; however, given sz / personality changes reported by wife, concern noted for slow head bleed /SAH with recommendation for repeat head imaging as below and deferred by pt today.  --Labs: CBC, BMET today. Repeat digoxin level in 2 weeks.  Defer repeat thyroid labs to PCP as below. --Referrals/Studies: Refer to EP for further discussion of tx options in aflutter and as up-titration of BB limited by BP, antiarrhythmic  therapy limited by LFTs, and he is higher risk on long term [place] given history of falls and long term EtOH.  MPI ordered for further ischemic evaluation prior to EP consideration of possible treatment options as below. If NSR restored, repeat echo at that time to reassess EF-deferred for now.  Given his apnea, WatchPat recommended but declined.  Complete EtOH cessation recommended by time he meets with EP.  HFrEF, EF 45-50% --Denies SOB and ongoing improved volume status since admission with IV diuresis.  Continue spironolactone, lasix.  Up titration of diuretics limited by soft BP/hypotension.  Repeat BMET.  9/22 Echo EF 45 to 50%, and unfortunately, unable to assess if RWMA.  Discussed that reduced EF could be 2/2 RVR, though given reduced EF, MPI recommended for further ischemic evaluation and given his CP reported today with known RF for CAD including recent CT with CAC.  Obtain MPI prior to EP discussion regarding possible antiarrhythmic options.  Continue to follow with GI for management of cirrhosis/ascites.  Chest pain/tightness --Vague description of chest pain/tightness.  Given reduced EF and RF for CAD, MPI ordered as above.  Discussed that CP could be due to volume as well, though he remains relatively euvolemic since discharge.  Continue current PPI and GI follow-up.  Recommend aggressive risk factor modification.  Discussed lifestyle changes.  Encouraged increasing activity, heart healthy diet.  H/o falls/syncope --Denies further syncope/falls since discharge with previous episodes thought 2/2 RVR and hypotension in the setting of ongoing EtOH use and uncontrolled ventricular rates.  Recent CTA negative for PE, carotids without significant stenosis.  If further falls, could consider repeat imaging to further evaluate for subclavian steal syndrome -will defer for now.  Recommended pt consider repeat head imaging if ongoing  concerning neuro symptoms as outlined above and to rule out SAH.  Check  BMET.  Maintain electrolytes at goal.  Continue current midodrine for BP support.  ?Seizures/slow reaction time, patient reported --As above in HPI with history of recent fall before admission and during which time he has had.  Recommended patient establish with neuro and repeat head imaging, declined today.  Continue to encourage repeat head imaging and neuro work-up at RTC.   DM2 on insulin -- 05/18/2021 hemoglobin A1c 5.1.  Glycemic control recommended for risk factor modification.  Continue SSI.  Further recommendations and management per PCP.  Cirrhosis/elevated LFTs Ongoing EtOH use --Known cirrhosis as above in HPI with patient report that he has reduced alcohol intake to 1 beer per day.  Recent AST 49, ALT 23.  Ascites noted during his admission as above.  Continue current diuretics.  Continue to follow with GI as indicated.  Complete EtOH cessation recommended to reduce the risks of bleeding and given cirrhosis/A. fib/A-flutter.  Advised against EtOH use with his current medications.  Association between EtOH use and A. fib/flutter discussed today as well.  ?Apneic episodes, witnessed/patient reported --Recommended watch pat with patient preference to defer.  Discussed association with atrial fibrillation/flutter with recommendation for treatment of sleep apnea if present.  Patient prefers to think about this for now -we did discuss the risks associated with ongoing untreated apnea.  Hypothyroidism --Recent TSH 4.994 with recommendation for repeat thyroid labs including free T4 with PCP.  Recommend control of thyroid, given this influences ventricular rates.  For now, continue current Synthroid.  Medication changes:  --Reduced to digoxin 0.125 mg qd Labs ordered:  --BMET --CBC --Repeat digoxin level in 2 weeks Studies / Imaging ordered:  --MPI --Electrophysiology referral Future considerations:  --WatchPat --?EtOH use? -- ?Head CT? -- Able to up-titrate medications? -- If NSR,  repeat echo to reassess EF -- Wean midodrine as tolerated  Disposition:  --RTC after MPI  *Please be aware that the above documentation was completed voice recognition software and may contain dictation errors.     Arvil Chaco, PA-C 05/30/2021

## 2021-06-01 ENCOUNTER — Encounter: Payer: Self-pay | Admitting: Physician Assistant

## 2021-06-08 ENCOUNTER — Other Ambulatory Visit
Admission: RE | Admit: 2021-06-08 | Discharge: 2021-06-08 | Disposition: A | Discharge status: Home / Self Care | Payer: Medicare Other | Attending: Physician Assistant | Admitting: Physician Assistant

## 2021-06-08 ENCOUNTER — Other Ambulatory Visit: Payer: Medicare Other

## 2021-06-08 DIAGNOSIS — Z79899 Other long term (current) drug therapy: Secondary | ICD-10-CM | POA: Diagnosis present

## 2021-06-08 DIAGNOSIS — I4892 Unspecified atrial flutter: Secondary | ICD-10-CM | POA: Insufficient documentation

## 2021-06-08 DIAGNOSIS — I4891 Unspecified atrial fibrillation: Secondary | ICD-10-CM | POA: Diagnosis present

## 2021-06-08 LAB — DIGOXIN LEVEL: Digoxin Level: 0.8 ng/mL (ref 0.8–2.0)

## 2021-06-19 ENCOUNTER — Encounter: Admission: RE | Admit: 2021-06-19 | Payer: Medicare Other | Source: Ambulatory Visit

## 2021-06-21 ENCOUNTER — Institutional Professional Consult (permissible substitution): Payer: Medicare Other | Admitting: Cardiology

## 2021-06-23 ENCOUNTER — Emergency Department
Admission: EM | Admit: 2021-06-23 | Discharge: 2021-06-24 | Disposition: A | Discharge status: Home / Self Care | Payer: Medicare Other | Attending: Emergency Medicine | Admitting: Emergency Medicine

## 2021-06-23 ENCOUNTER — Emergency Department: Payer: Medicare Other

## 2021-06-23 DIAGNOSIS — R42 Dizziness and giddiness: Secondary | ICD-10-CM | POA: Diagnosis present

## 2021-06-23 DIAGNOSIS — J449 Chronic obstructive pulmonary disease, unspecified: Secondary | ICD-10-CM | POA: Insufficient documentation

## 2021-06-23 DIAGNOSIS — I4811 Longstanding persistent atrial fibrillation: Secondary | ICD-10-CM | POA: Insufficient documentation

## 2021-06-23 DIAGNOSIS — R55 Syncope and collapse: Secondary | ICD-10-CM | POA: Diagnosis not present

## 2021-06-23 DIAGNOSIS — I2510 Atherosclerotic heart disease of native coronary artery without angina pectoris: Secondary | ICD-10-CM | POA: Diagnosis not present

## 2021-06-23 DIAGNOSIS — E1140 Type 2 diabetes mellitus with diabetic neuropathy, unspecified: Secondary | ICD-10-CM | POA: Insufficient documentation

## 2021-06-23 DIAGNOSIS — Z87891 Personal history of nicotine dependence: Secondary | ICD-10-CM | POA: Insufficient documentation

## 2021-06-23 DIAGNOSIS — Z79899 Other long term (current) drug therapy: Secondary | ICD-10-CM | POA: Insufficient documentation

## 2021-06-23 DIAGNOSIS — R5381 Other malaise: Secondary | ICD-10-CM

## 2021-06-23 DIAGNOSIS — Z20822 Contact with and (suspected) exposure to covid-19: Secondary | ICD-10-CM | POA: Diagnosis not present

## 2021-06-23 DIAGNOSIS — I110 Hypertensive heart disease with heart failure: Secondary | ICD-10-CM | POA: Diagnosis not present

## 2021-06-23 DIAGNOSIS — E039 Hypothyroidism, unspecified: Secondary | ICD-10-CM | POA: Insufficient documentation

## 2021-06-23 DIAGNOSIS — Z794 Long term (current) use of insulin: Secondary | ICD-10-CM | POA: Diagnosis not present

## 2021-06-23 DIAGNOSIS — I509 Heart failure, unspecified: Secondary | ICD-10-CM | POA: Diagnosis not present

## 2021-06-23 DIAGNOSIS — E119 Type 2 diabetes mellitus without complications: Secondary | ICD-10-CM

## 2021-06-23 DIAGNOSIS — Z7901 Long term (current) use of anticoagulants: Secondary | ICD-10-CM | POA: Diagnosis not present

## 2021-06-23 LAB — CBC WITH DIFFERENTIAL/PLATELET
Abs Immature Granulocytes: 0.03 10*3/uL (ref 0.00–0.07)
Basophils Absolute: 0 10*3/uL (ref 0.0–0.1)
Basophils Relative: 1 %
Eosinophils Absolute: 0.1 10*3/uL (ref 0.0–0.5)
Eosinophils Relative: 4 %
HCT: 44 % (ref 39.0–52.0)
Hemoglobin: 15.5 g/dL (ref 13.0–17.0)
Immature Granulocytes: 1 %
Lymphocytes Relative: 19 %
Lymphs Abs: 0.6 10*3/uL — ABNORMAL LOW (ref 0.7–4.0)
MCH: 34.6 pg — ABNORMAL HIGH (ref 26.0–34.0)
MCHC: 35.2 g/dL (ref 30.0–36.0)
MCV: 98.2 fL (ref 80.0–100.0)
Monocytes Absolute: 0.4 10*3/uL (ref 0.1–1.0)
Monocytes Relative: 13 %
Neutro Abs: 1.8 10*3/uL (ref 1.7–7.7)
Neutrophils Relative %: 62 %
Platelets: 63 10*3/uL — ABNORMAL LOW (ref 150–400)
RBC: 4.48 MIL/uL (ref 4.22–5.81)
RDW: 13 % (ref 11.5–15.5)
WBC: 3 10*3/uL — ABNORMAL LOW (ref 4.0–10.5)
nRBC: 0 % (ref 0.0–0.2)

## 2021-06-23 LAB — COMPREHENSIVE METABOLIC PANEL
ALT: 21 U/L (ref 0–44)
AST: 53 U/L — ABNORMAL HIGH (ref 15–41)
Albumin: 2.8 g/dL — ABNORMAL LOW (ref 3.5–5.0)
Alkaline Phosphatase: 124 U/L (ref 38–126)
Anion gap: 12 (ref 5–15)
BUN: 8 mg/dL (ref 8–23)
CO2: 26 mmol/L (ref 22–32)
Calcium: 8.9 mg/dL (ref 8.9–10.3)
Chloride: 95 mmol/L — ABNORMAL LOW (ref 98–111)
Creatinine, Ser: 0.71 mg/dL (ref 0.61–1.24)
GFR, Estimated: >60 mL/min (ref >60)
Glucose, Bld: 143 mg/dL — ABNORMAL HIGH (ref 70–99)
Potassium: 4.1 mmol/L (ref 3.5–5.1)
Sodium: 133 mmol/L — ABNORMAL LOW (ref 135–145)
Total Bilirubin: 2.5 mg/dL — ABNORMAL HIGH (ref 0.3–1.2)
Total Protein: 7.1 g/dL (ref 6.5–8.1)

## 2021-06-23 LAB — URINALYSIS, COMPLETE (UACMP) WITH MICROSCOPIC
Bacteria, UA: NONE SEEN
Bilirubin Urine: NEGATIVE
Glucose, UA: NEGATIVE mg/dL
Ketones, ur: NEGATIVE mg/dL
Leukocytes,Ua: NEGATIVE
Nitrite: NEGATIVE
Protein, ur: NEGATIVE mg/dL
Specific Gravity, Urine: 1.01 (ref 1.005–1.030)
Squamous Epithelial / HPF: NONE SEEN (ref 0–5)
pH: 6 (ref 5.0–8.0)

## 2021-06-23 LAB — DIGOXIN LEVEL: Digoxin Level: 0.6 ng/mL — ABNORMAL LOW (ref 0.8–2.0)

## 2021-06-23 LAB — RESP PANEL BY RT-PCR (FLU A&B, COVID) ARPGX2
Influenza A by PCR: NEGATIVE
Influenza B by PCR: NEGATIVE
SARS Coronavirus 2 by RT PCR: NEGATIVE

## 2021-06-23 MED ORDER — SODIUM CHLORIDE 0.9 % IV BOLUS
500.0000 mL | Freq: Once | INTRAVENOUS | Status: AC
  Administered 2021-06-23: 500 mL via INTRAVENOUS

## 2021-06-23 MED ORDER — METOPROLOL TARTRATE 25 MG PO TABS
25.0000 mg | ORAL_TABLET | ORAL | Status: AC
  Administered 2021-06-23: 25 mg via ORAL
  Filled 2021-06-23: qty 1

## 2021-06-23 MED ORDER — DIGOXIN 0.25 MG/ML IJ SOLN
0.2500 mg | INTRAMUSCULAR | Status: AC
  Administered 2021-06-23: 0.25 mg via INTRAVENOUS
  Filled 2021-06-23: qty 2

## 2021-06-23 MED ORDER — APIXABAN 5 MG PO TABS
5.0000 mg | ORAL_TABLET | ORAL | Status: AC
  Administered 2021-06-23: 5 mg via ORAL
  Filled 2021-06-23: qty 1

## 2021-06-23 MED ORDER — INSULIN GLARGINE-YFGN 100 UNIT/ML ~~LOC~~ SOLN
35.0000 [IU] | Freq: Once | SUBCUTANEOUS | Status: AC
  Administered 2021-06-23: 35 [IU] via SUBCUTANEOUS
  Filled 2021-06-23: qty 0.35

## 2021-06-23 MED ORDER — LACTULOSE 10 GM/15ML PO SOLN
20.0000 g | ORAL | Status: DC
  Filled 2021-06-23: qty 30

## 2021-06-23 MED ORDER — MIDODRINE HCL 5 MG PO TABS
10.0000 mg | ORAL_TABLET | ORAL | Status: AC
  Administered 2021-06-23: 10 mg via ORAL
  Filled 2021-06-23: qty 2

## 2021-06-23 NOTE — ED Notes (Signed)
ERMD at bedside at this time 

## 2021-06-23 NOTE — ED Triage Notes (Addendum)
Brought in from home per EMS for near syncope/ syncopal episode. Per EMS initially ST w/ PVC's  per CM , BGL=149. Pt presents to ED AAOx4, breathing even-unlabored.  Pt reports had  4 episodes of syncope episodes in 24 hrs last month and was seen &admitted that time here. PMHX CHF,Afib, DM

## 2021-06-23 NOTE — ED Notes (Signed)
This RN responds to call light. Pt noted to have been incontinent while trying to use urinal. Full bed change performed, appropriate peri care provided. Pt requests new brief. Pt noted to be pursed lip breathing with Afib in 120's upon arrival. Pt denies SOB states this feels like his norm.   Given warm blankets. Primary RN aware. Urine sent to lab.

## 2021-06-23 NOTE — ED Provider Notes (Signed)
[facility] Emergency Department Provider Note  ____________________________________________  Time seen: Approximately 11:25 PM  I have reviewed the triage vital signs and the nursing notes.   HISTORY  Chief Complaint Near Syncope    HPI Lawrence Osborn is a 70 y.o. male with a history of cirrhosis COPD diabetes hypertension who was the ED due to an episode of dizziness.  He reports being in his usual state of health yesterday, and this morning on waking up he felt more tired than usual.  He stayed in bed all day, skipping his midday dose of medication.  This evening he got out of bed to go to the kitchen for food and water and got dizzy.  He denies passing out or hitting his head.  No chest pain or shortness of breath.  No other acute symptoms.  Denies fever or body aches.  No cough.  He reports compliance with his medications prior to today.    Past Medical History:  Diagnosis Date  . Anemia   . Cirrhosis of liver (HCC)   . COPD (chronic obstructive pulmonary disease) (HCC)   . Diabetes mellitus without complication (HCC)   . DVT (deep venous thrombosis) (HCC)    mid calf to groin  . GI bleed   . Hypertension   . Peritonitis (HCC)   . Pneumothorax      Patient Active Problem List   Diagnosis Date Noted  . Acute on chronic HFrEF (heart failure with reduced ejection fraction) (HCC)   . Prolonged QT interval 05/19/2021  . Atrial fibrillation and flutter (HCC)   . Acute respiratory failure (HCC) 05/18/2021  . Acute hepatic encephalopathy 05/18/2021  . Atrial fibrillation, chronic (HCC)   . Alcoholic cirrhosis of liver without ascites (HCC)   . Sepsis (HCC) 07/30/2019  . Toe infection   . Cellulitis of left lower extremity   . Elevated lactic acid level   . Chronic alcohol abuse 09/11/2018  . Acute deep vein thrombosis (DVT) of proximal vein of right lower extremity (HCC)   . Acute pulmonary embolism (HCC)   . Thrombocytopenia (HCC)   .  Elevated d-dimer 08/31/2018  . COPD (chronic obstructive pulmonary disease) (HCC) 08/30/2018  . Alcohol use 08/30/2018  . Hypotension 08/30/2018  . CAD (coronary artery disease) 08/30/2018  . PAF (paroxysmal atrial fibrillation) (HCC) 08/30/2018  . Hypothyroidism 08/30/2018  . Syncope 08/30/2018  . Type 2 diabetes mellitus with peripheral neuropathy (HCC) 07/30/2018  . H/O herpes zoster 07/26/2017  . Lumbar disc disease 07/26/2017  . Recurrent major depressive disorder, in partial remission (HCC) 07/16/2016  . Thoracic radiculopathy 07/16/2016  . Pain management contract agreement 03/26/2016  . Benign essential tremor 08/21/2015  . Chronic left-sided low back pain 08/21/2015  . Depression 08/21/2015  . Gastroesophageal reflux disease without esophagitis 08/21/2015     Past Surgical History:  Procedure Laterality Date  . APPENDECTOMY    . COLONOSCOPY    . DORSAL SLIT N/A 09/20/2020   Procedure: DORSAL SLIT;  Surgeon: Riki Altes, MD;  Location: [facility] ORS;  Service: Urology;  Laterality: N/A;  . HERNIA REPAIR       Prior to Admission medications   Medication Sig Start Date End Date Taking? Authorizing Provider  albuterol (PROVENTIL HFA;VENTOLIN HFA) 108 (90 Base) MCG/ACT inhaler Inhale 2 puffs into the lungs every 6 (six) hours as needed for wheezing or shortness of breath.    [provider]  apixaban (ELIQUIS) 5 MG TABS tablet Take 1 tablet (5 mg total)  by mouth 2 (two) times daily. 08/03/19   Alford Highland, MD  Cyanocobalamin (B-12) 2500 MCG TABS Take 2,500 mcg by mouth daily.    [provider]  digoxin (LANOXIN) 0.125 MG tablet Take 1 tablet (0.125 mg total) by mouth daily. 05/30/21 11/26/21  Marisue Ivan D, PA-C  ferrous gluconate (FERGON) 324 MG tablet Take 324 mg by mouth daily with breakfast.    [provider]  furosemide (LASIX) 20 MG tablet Take 2 tablets (40 mg total) by mouth 2 (two) times daily. 05/23/21   Marrion Coy, MD   HYDROcodone-acetaminophen (NORCO/VICODIN) 5-325 MG tablet Take 1 tablet by mouth every 4 (four) hours as needed for moderate pain. 09/20/20   Stoioff, Verna Czech, MD  insulin glargine (LANTUS) 100 UNIT/ML injection Inject 35 Units into the skin 2 (two) times daily.    [provider]  lactulose (CHRONULAC) 10 GM/15ML solution Take 20 g by mouth 2 (two) times daily. 04/18/21   [provider]  levothyroxine (SYNTHROID, LEVOTHROID) 100 MCG tablet Take 100 mcg by mouth daily before breakfast.    [provider]  metoprolol tartrate (LOPRESSOR) 25 MG tablet Take 1 tablet (25 mg total) by mouth 2 (two) times daily. 05/23/21   Marrion Coy, MD  midodrine (PROAMATINE) 10 MG tablet Take 1 tablet (10 mg total) by mouth 3 (three) times daily with meals. 05/23/21   Marrion Coy, MD  omega-3 fish oil (MAXEPA) 1000 MG CAPS capsule Take 1,000 mg by mouth 2 (two) times daily.    [provider]  omeprazole (PRILOSEC) 20 MG capsule Take 20 mg by mouth daily.    [provider]  QUEtiapine (SEROQUEL) 100 MG tablet Take 0.5 tablets (50 mg total) by mouth at bedtime. Take 100 mg in the morning and 200 mg at bedtime 05/23/21   Marrion Coy, MD  Semaglutide, 1 MG/DOSE, (OZEMPIC, 1 MG/DOSE,) 2 MG/1.5ML SOPN Inject 1 mg into the skin every Sunday.    [provider]  spironolactone (ALDACTONE) 50 MG tablet Take 50 mg by mouth daily. 04/18/21   [provider]  ST JOHNS WORT PO Take 2 tablets by mouth 2 (two) times daily.    [provider]  traMADol (ULTRAM) 50 MG tablet Take 50 mg by mouth 2 (two) times daily.    [provider]     Allergies Patient has no known allergies.   Family History  Problem Relation Age of Onset  . Hypothyroidism Mother   . CAD Father     Social History Social History   Tobacco Use  . Smoking status: Former  . Smokeless tobacco: Never  Substance Use Topics  . Alcohol use: Yes    Alcohol/week: 22.0 standard  drinks    Types: 1 Glasses of wine, 21 Cans of beer per week    Comment: 3 beers a day  . Drug use: Not Currently    Review of Systems  Constitutional:   No fever or chills.  ENT:   No sore throat. No rhinorrhea. Cardiovascular:   No chest pain or syncope. Respiratory:   No dyspnea or cough. Gastrointestinal:   Negative for abdominal pain, vomiting and diarrhea.  Musculoskeletal:   Negative for focal pain or swelling All other systems reviewed and are negative except as documented above in ROS and HPI.  ____________________________________________   PHYSICAL EXAM:  VITAL SIGNS: ED Triage Vitals  Enc Vitals Group     BP 06/23/21 2030 114/72     Pulse Rate 06/23/21 2030 Marland Kitchen)  118     Resp 06/23/21 2030 16     Temp --      Temp src --      SpO2 06/23/21 2030 93 %     Weight --      Height --      Head Circumference --      Peak Flow --      Pain Score 06/23/21 2105 0     Pain Loc --      Pain Edu? --      Excl. in GC? --     Vital signs reviewed, nursing assessments reviewed.   Constitutional:   Alert and oriented. Non-toxic appearance. Eyes:   Conjunctivae are normal. EOMI. PERRL. ENT      Head:   Normocephalic and atraumatic.      Nose:   Normal      Mouth/Throat:   Normal      Neck:   No meningismus. Full ROM. Hematological/Lymphatic/Immunilogical:   No cervical lymphadenopathy. Cardiovascular:   Tachycardia rate of 120, . Symmetric bilateral radial and DP pulses.  No murmurs. Cap refill less than 2 seconds. Respiratory:   Normal respiratory effort without tachypnea/retractions. Breath sounds are clear and equal bilaterally. No wheezes/rales/rhonchi. Gastrointestinal:   Soft and nontender. Non distended. There is no CVA tenderness.  No rebound, rigidity, or guarding. Genitourinary:   deferred Musculoskeletal:   Normal range of motion in all extremities. No joint effusions.  No lower extremity tenderness.  No edema. Neurologic:   Normal speech and language.   Motor grossly intact. No acute focal neurologic deficits are appreciated.  Skin:    Skin is warm, dry and intact. No rash noted.  No petechiae, purpura, or bullae.  ____________________________________________    LABS (pertinent positives/negatives) (all labs ordered are listed, but only abnormal results are displayed) Labs Reviewed  COMPREHENSIVE METABOLIC PANEL - Abnormal; Notable for the following components:      Result Value   Sodium 133 (*)    Chloride 95 (*)    Glucose, Bld 143 (*)    Albumin 2.8 (*)    AST 53 (*)    Total Bilirubin 2.5 (*)    All other components within normal limits  CBC WITH DIFFERENTIAL/PLATELET - Abnormal; Notable for the following components:   WBC 3.0 (*)    MCH 34.6 (*)    Platelets 63 (*)    Lymphs Abs 0.6 (*)    All other components within normal limits  URINALYSIS, COMPLETE (UACMP) WITH MICROSCOPIC - Abnormal; Notable for the following components:   Color, Urine YELLOW (*)    APPearance CLEAR (*)    Hgb urine dipstick MODERATE (*)    All other components within normal limits  DIGOXIN LEVEL - Abnormal; Notable for the following components:   Digoxin Level 0.6 (*)    All other components within normal limits  RESP PANEL BY RT-PCR (FLU A&B, COVID) [id number]   ____________________________________________   EKG  Interpreted by me Atrial flutter rate of 118.  Right axis, right bundle branch block.  No acute ischemic changes.  Unchanged compared to previous EKG on May 30, 2021.  ____________________________________________    RADIOLOGY  No results found.  ____________________________________________   PROCEDURES Procedures  ____________________________________________  DIFFERENTIAL DIAGNOSIS   Electrolyte abnormality, UTI, pneumonia, viral illness, subtherapeutic use of digoxin/metoprolol/midodrine  CLINICAL IMPRESSION / ASSESSMENT AND PLAN / ED COURSE  Medications ordered in the ED: Medications  lactulose (CHRONULAC)  10 GM/15ML solution 20 g (20 g Oral Not  Given 06/23/21 2315)  sodium chloride 0.9 % bolus 500 mL (0 mLs Intravenous Stopped 06/23/21 2201)  apixaban (ELIQUIS) tablet 5 mg (5 mg Oral Given 06/23/21 2314)  insulin glargine-yfgn (SEMGLEE) injection 35 Units (35 Units Subcutaneous Given 06/23/21 2320)  metoprolol tartrate (LOPRESSOR) tablet 25 mg (25 mg Oral Given 06/23/21 2314)  midodrine (PROAMATINE) tablet 10 mg (10 mg Oral Given 06/23/21 2313)  digoxin (LANOXIN) 0.25 MG/ML injection 0.25 mg (0.25 mg Intravenous Given 06/23/21 2321)    Pertinent labs & imaging results that were available during my care of the patient were reviewed by me and considered in my medical decision making (see chart for details).  Lawrence Osborn was evaluated in Emergency Department on 06/23/2021 for the symptoms described in the history of present illness. He was evaluated in the context of the global COVID-19 pandemic, which necessitated consideration that the patient might be at risk for infection with the SARS-CoV-2 virus that causes COVID-19. Institutional protocols and algorithms that pertain to the evaluation of patients at risk for COVID-19 are in a state of rapid change based on information released by regulatory bodies including the CDC and federal and state organizations. These policies and algorithms were followed during the patient's care in the ED.   Patient presents with episode of dizziness with standing, consistent with orthostatics, possibly related to missing his dose of midodrine or not taking his metoprolol or digoxin as prescribed versus dehydration and viral illness.  EKG is unremarkable, vital signs are normal except for heart rate of 118 which is pretty consistent with his established atrial flutter.  He reports a heart rate of 118 is normal for him.  Labs show mild leukopenia with lymphocytopenia consistent with a viral illness.  COVID and flu are negative, urinalysis and serum labs are unremarkable except  for subtherapeutic digoxin level of 0.6.  Patient is eager to be discharged and not interested in hospitalization.  We will give the patient his evening doses of medications, extra loading dose of digoxin 0.25 mg IV, and patient can be discharged home.  Clinical Course as of 06/23/21 2332  Fri Jun 23, 2021  2332 Chest x-ray unremarkable. [PS]    Clinical Course User Index [PS] Sharman Cheek, MD     ____________________________________________   FINAL CLINICAL IMPRESSION(S) / ED DIAGNOSES    Final diagnoses:  Malaise and fatigue  Near syncope  Longstanding persistent atrial fibrillation (HCC)  Type 2 diabetes mellitus without complication, with long-term current use of insulin (HCC)  Chronic congestive heart failure, unspecified heart failure type [facility])     ED Discharge Orders     None       Portions of this note were generated with dragon dictation software. Dictation errors may occur despite best attempts at proofreading.    Sharman Cheek, MD 06/23/21 [phone]

## 2021-06-23 NOTE — Discharge Instructions (Addendum)
Your digoxin level was 0.6 today which is low.  This was allowing your heart rate to go too fast which may be contributing to the dizziness.  We gave you an extra loading dose of IV diltiazem to help improve this.  Your other labs are okay.  Continue taking all of your home medications.  Follow your diet plan, stay hydrated, and follow-up with your doctor on Monday.

## 2021-06-24 NOTE — ED Notes (Signed)
Called pt's wife Dois Davenport at [phone], made aware of pt's status and advise for discharge

## 2021-06-27 ENCOUNTER — Other Ambulatory Visit: Payer: Self-pay

## 2021-06-27 ENCOUNTER — Encounter
Admission: RE | Admit: 2021-06-27 | Discharge: 2021-06-27 | Disposition: A | Discharge status: Home / Self Care | Payer: Medicare Other | Source: Ambulatory Visit | Attending: Physician Assistant | Admitting: Physician Assistant

## 2021-06-27 DIAGNOSIS — R079 Chest pain, unspecified: Secondary | ICD-10-CM | POA: Insufficient documentation

## 2021-06-27 MED ORDER — TECHNETIUM TC 99M TETROFOSMIN IV KIT
10.0000 | PACK | Freq: Once | INTRAVENOUS | Status: AC | PRN
  Administered 2021-06-27: 10.4 via INTRAVENOUS

## 2021-06-27 MED ORDER — REGADENOSON 0.4 MG/5ML IV SOLN
0.4000 mg | Freq: Once | INTRAVENOUS | Status: AC
  Administered 2021-06-27: 0.4 mg via INTRAVENOUS
  Filled 2021-06-27: qty 5

## 2021-06-27 MED ORDER — TECHNETIUM TC 99M TETROFOSMIN IV KIT
30.0000 | PACK | Freq: Once | INTRAVENOUS | Status: AC | PRN
  Administered 2021-06-27: 32.7 via INTRAVENOUS

## 2021-06-28 ENCOUNTER — Telehealth: Payer: Self-pay | Admitting: Cardiovascular Disease

## 2021-06-28 LAB — NM MYOCAR MULTI W/SPECT W/WALL MOTION / EF
Estimated workload: 1
Exercise duration (min): 0 min
Exercise duration (sec): 0 s
LV dias vol: 51 mL (ref 62–150)
LV sys vol: 29 mL
MPHR: 152 {beats}/min
Nuc Stress EF: 58 %
Peak HR: 121 {beats}/min
Percent HR: 79 %
Rest HR: 118 {beats}/min
Rest Nuclear Isotope Dose: 10.4 mCi
SDS: 1
SRS: 12
SSS: 2
ST Depression (mm): 0 mm
Stress Nuclear Isotope Dose: 32.7 mCi
TID: 0.75

## 2021-06-28 NOTE — Telephone Encounter (Signed)
DPR on file. Spoke with the patients wife. Prelim reviewed the patients 06/27/21 stress test results. Advised the pt wife that once reviewed by the ordering provider a nurse will call with the finalized results and provider's recommendation.  Patient is currently scheduled to see Dr. Kirke Corin on 07/13/21.  Pt wife sts that the patient is having almost daily episodes of dizziness, pre-syncope, syncope. Pt can usually tell when the episode is coming on. They are short in duration lasting 20-30 seconds. He denies diaphoresis, palpations or feeling like his heart is racing. EMS was last called to the pt home on 06/23/21 he was transported to the ED for evaluation. Advised the pt wife that I would recommend that the patient be seen sooner. They do not want to cancel the appt with Dr. Kirke Corin, adv that I would leave this appt in place. Scheduled an appt with Cadence Furth, PA on 07/04/21 @ 2pm. Pt wife aware of the appt date and time. Adv her that the patient is to seek emergent care if any reoccurrence of syncope.  Pt wife verbalized and voiced appreciation for the call back.

## 2021-06-28 NOTE — Telephone Encounter (Signed)
Please call with stress test resutls.

## 2021-06-29 ENCOUNTER — Telehealth: Payer: Self-pay

## 2021-06-29 DIAGNOSIS — E785 Hyperlipidemia, unspecified: Secondary | ICD-10-CM

## 2021-06-29 MED ORDER — EZETIMIBE 10 MG PO TABS: 10.0000 mg | ORAL_TABLET | Freq: Every day | ORAL | 3 refills | Status: DC

## 2021-06-29 NOTE — Telephone Encounter (Signed)
-----   Message from Lennon Alstrom, PA-C sent at 06/28/2021  5:02 PM EDT ----- [facility] news! Stress test low risk.  Coronary and aortic calcifications were noted, but without any blockages.  Recommend risk factor modification.  His last LDL was 107.   Given his elevated liver enzymes in the past, and as the CT also showed cirrhosis, we will hold off on adding a statin.  (1) Start Zetia 10mg  daily (if agreeable) (2) Repeat lipid and liver function in 6-8 weeks

## 2021-06-29 NOTE — Telephone Encounter (Signed)
Called and spoke with patients wife and informed her of the result note as documented below. Patients wife stated he will get his Lipid and Liver Labs done at Primary care as the evaluate it all the time. I saw that the patient cancelled an appointment with Dr. Lalla Brothers and I offered to reschedule it and patients wife stated they would talk about it at the follow apt with Cadence Furth on 07/04/21. Prescription for Zetia sent to patients pharmacy.

## 2021-06-30 ENCOUNTER — Ambulatory Visit: Payer: Medicare Other | Admitting: Physician Assistant

## 2021-07-03 NOTE — Progress Notes (Signed)
Cardiology Office Note:    Date:  07/05/2021   ID:  Lawrence Osborn, DOB Jun 27, 1951, MRN [id number]  PCP:  Lawrence Penton, MD  [facility] Cardiologist:  None  [facility] Electrophysiologist:  None   Referring MD: Lawrence Penton, MD   Chief Complaint: Follow-up for stress test, needs orthostatics  History of Present Illness:    Lawrence Osborn is a 71 y.o. 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   In 2019, he was seen by Lawrence Osborn at [place] [facility] for weakness and falls.  EF 50 to 55%. Carotids with mild stenosis.  It was thought that the etiology of his fall could be transient atrial fibrillation with RVR and dehydration. He was continued on nadolol in digoxin.  Recommendation was to discontinue anticoagulation given heavy alcohol intake/falls; however, he continued on anticoagulation.   On 05/18/2021, he was admitted to [facility] after 4 syncopal episodes and found to be in atrial flutter with RVR and acute HFrEF with EF 45 to 50%.   During 2 of the falls, he struck the front and back of his head, though head CT negative. Carotid US without significant stenosis but possible proximal subclavian artery stenosis/suspicious for subclavian steal syndrome with recommendation for CTA.  US abdomen showed moderate ascites.  CTA negative for PE and showed moderate left pleural effusion and cirrhosis, cholelithiasis, aortic atherosclerosis, 3V CAC, AV calcification with possible AS.  He was IV diuresed during admission with improvement in his symptoms.  He was he was discharged on furosemide 40 mg BID, spironolactone 25 mg qd, digoxin 0.25 mg qd, metoprolol 25 mg BID, midodrine, and apixaban.    Last seen 05/30/2021 for hospital follow-up.  She reported seizure-like episodes.  He was in a flutter with RVR but denied any symptoms.  Digoxin was reduced, needs  repeat labs.  Patient was referred to EP.  For new cardiomyopathy his stress test was ordered.  Seen in the ED 06/23/2021 for near syncope due to an episode of dizziness felt to be consistent with orthostatic hypotension possibly related to missing dose of midodrine or not taking his metoprolol or digoxin versus dehydration and viral illness.  Patient was wanting to be discharged.  He was given his evening dose of meds with an extra loading dose of digoxin IV.  Today,the stress test was reviewed in detail. Wife reports patient is now having seizures. Sent a note to PCP. Patient's wife is very upset. Patient had a seizure on Saturday. Has post-ictal confusion. Not on anti-seizure medications. He sees PCP next week. Recently started on midodrine. Orthostatics positive. Normal heart rate is around 118bpm.  LDL 107  Referral to neurology.    Past Medical History:  Diagnosis Date   Anemia    Cirrhosis of liver (HCC)    COPD (chronic obstructive pulmonary disease) (HCC)    Diabetes mellitus without complication (HCC)    DVT (deep venous thrombosis) (HCC)    mid calf to groin   GI bleed    Hypertension    Peritonitis (HCC)    Pneumothorax     Past Surgical History:  Procedure Laterality Date   APPENDECTOMY     COLONOSCOPY     DORSAL SLIT N/A 09/20/2020   Procedure: DORSAL SLIT;  Surgeon: Lawrence Altes, MD;  Location: [facility] ORS;  Service: Urology;  Laterality: N/A;  HERNIA REPAIR      Current Medications: Current Meds  Medication Sig   albuterol (PROVENTIL HFA;VENTOLIN HFA) 108 (90 Base) MCG/ACT inhaler Inhale 2 puffs into the lungs every 6 (six) hours as needed for wheezing or shortness of breath.   apixaban (ELIQUIS) 5 MG TABS tablet Take 1 tablet (5 mg total) by mouth 2 (two) times daily.   Cyanocobalamin (B-12) 2500 MCG TABS Take 2,500 mcg by mouth daily.   digoxin (LANOXIN) 0.125 MG tablet Take 1 tablet (0.125 mg total) by mouth daily.   ezetimibe (ZETIA) 10 MG tablet Take 1  tablet (10 mg total) by mouth daily.   ferrous gluconate (FERGON) 324 MG tablet Take 324 mg by mouth daily with breakfast.   furosemide (LASIX) 20 MG tablet Take 2 tablets (40 mg total) by mouth 2 (two) times daily.   insulin glargine (LANTUS) 100 UNIT/ML injection Inject 35 Units into the skin 2 (two) times daily.   lactulose (CHRONULAC) 10 GM/15ML solution Take 20 g by mouth 2 (two) times daily.   levothyroxine (SYNTHROID, LEVOTHROID) 100 MCG tablet Take 100 mcg by mouth daily before breakfast.   metoprolol tartrate (LOPRESSOR) 25 MG tablet Take 1 tablet (25 mg total) by mouth 2 (two) times daily.   midodrine (PROAMATINE) 10 MG tablet Take 1 tablet (10 mg total) by mouth 3 (three) times daily with meals.   omega-3 fish oil (MAXEPA) 1000 MG CAPS capsule Take 1,000 mg by mouth 2 (two) times daily.   omeprazole (PRILOSEC) 20 MG capsule Take 20 mg by mouth daily.   QUEtiapine (SEROQUEL) 100 MG tablet Take 0.5 tablets (50 mg total) by mouth at bedtime. Take 100 mg in the morning and 200 mg at bedtime (Patient taking differently: Take 100 mg by mouth at bedtime.)   Semaglutide, 1 MG/DOSE, (OZEMPIC, 1 MG/DOSE,) 2 MG/1.5ML SOPN Inject 1 mg into the skin every Sunday.   spironolactone (ALDACTONE) 50 MG tablet Take 50 mg by mouth daily.   ST JOHNS WORT PO Take 2 tablets by mouth 2 (two) times daily.   traMADol (ULTRAM) 50 MG tablet Take 50 mg by mouth 2 (two) times daily.     Allergies:   Patient has no known allergies.   Social History   Socioeconomic History   Marital status: Married    Spouse name: Not on file   Number of children: Not on file   Years of education: Not on file   Highest education level: Not on file  Occupational History   Not on file  Tobacco Use   Smoking status: Former   Smokeless tobacco: Never  Substance and Sexual Activity   Alcohol use: Yes    Alcohol/week: 22.0 standard drinks    Types: 1 Glasses of wine, 21 Cans of beer per week    Comment: 3 beers a day    Drug use: Not Currently   Sexual activity: Yes  Other Topics Concern   Not on file  Social History Narrative   Not on file   Social Determinants of Health   Financial Resource Strain: Not on file  Food Insecurity: Not on file  Transportation Needs: Not on file  Physical Activity: Not on file  Stress: Not on file  Social Connections: Not on file     Family History: The patient's family history includes CAD in his father; Hypothyroidism in his mother.  ROS:   Please see the history of present illness.     All other systems reviewed and are negative.  EKGs/Labs/Other Studies Reviewed:    The following studies were reviewed today:  Echo 05/19/21  1. Left ventricular ejection fraction, by estimation, is 45 to 50%. The  left ventricle has mildly decreased function. Left ventricular endocardial  border not optimally defined to evaluate regional wall motion. Left  ventricular diastolic parameters are  indeterminate.   2. Right ventricular systolic function is mildly reduced. The right  ventricular size is mildly enlarged. There is normal pulmonary artery  systolic pressure.   3. Right atrial size was moderately dilated.   4. The mitral valve is normal in structure. No evidence of mitral valve  regurgitation. No evidence of mitral stenosis.   5. The aortic valve is normal in structure. Aortic valve regurgitation is  not visualized. Mild to moderate aortic valve sclerosis/calcification is  present, without any evidence of aortic stenosis.   6. The inferior vena cava is normal in size with greater than 50%  respiratory variability, suggesting right atrial pressure of 3 mmHg.   7. Challenging image quality.   EKG:  EKG is  ordered today.  The ekg ordered today demonstrates Aflutter, 127bpm, LAD, RBBB, possible rate related changes.   Recent Labs: 05/18/2021: B Natriuretic Peptide 318.3 05/19/2021: TSH 4.994 05/21/2021: Magnesium 1.8 06/23/2021: ALT 21; BUN 8; Creatinine, Ser 0.71;  Hemoglobin 15.5; Platelets 63; Potassium 4.1; Sodium 133  Recent Lipid Panel No results found for: CHOL, TRIG, HDL, CHOLHDL, VLDL, LDLCALC, LDLDIRECT    Physical Exam:    VS:  BP 111/69 (BP Location: Right Arm, Patient Position: Sitting, Cuff Size: Normal)   Pulse (!) 127   Ht 5\' 8"  (1.727 m)   SpO2 95%   BMI 35.43 kg/m     Wt Readings from Last 3 Encounters:  05/30/21 233 lb (105.7 kg)  05/22/21 251 lb 5.2 oz (114 kg)  04/11/21 288 lb 1.6 oz (130.7 kg)     GEN:  Well nourished, well developed in no acute distress HEENT: Normal NECK: No JVD; No carotid bruits LYMPHATICS: No lymphadenopathy CARDIAC: Reg Irreg, no murmurs, rubs, gallops RESPIRATORY:  Clear to auscultation without rales, wheezing or rhonchi  ABDOMEN: Soft, non-tender, non-distended MUSCULOSKELETAL:  No edema; No deformity  SKIN: Warm and dry NEUROLOGIC:  Alert and oriented x 3 PSYCHIATRIC:  Normal affect   ASSESSMENT:    1. Atrial fibrillation and flutter (HCC)   2. Chest pain, unspecified type   3. Alcoholic cirrhosis, unspecified whether ascites present (HCC)   4. Current drinker of alcohol   5. History of syncope   6. Hypotension, unspecified hypotension type   7. Personal history of fall   8. Chronic HFrEF (heart failure with reduced ejection fraction) (HCC)    PLAN:    In order of problems listed above:  Permanent A. Fib/flutter with RVR Appears has been in aflutter for at least 2 years. Has not had cardioversion in the past. He is on Eliquis and denies missing doses. Stress test was ordered to evaluate for ischemia, CT images showed coronary/aortic atherosclerosis. Pt was supposed to see EP, but was unable to bc the stress test had ot be rescheduled. Patient is in rapid aflutter today. He is asymptomatic. He is on digoxin 0.125mg  daily and Lopressor 25mg  BID. Cannot increase BB with orthostatic hypotension. Patient is on Eliquis 5mg  BID and denies missing doses. Discussed possibility of  cardioversion, but patient and the wife would like to wait on this and see Dr. 04/13/21 next week. I will re-schedule apt with EP.   HFrEF, EF  45 to 50% Possibly from longstanding aflutter. Recent Myoview Lexiscan was low risk with no ischemia as above. Patient is on lasix 40mg  BID and spiro 50mg  daily. He appears euvolemic on exam. Continue Toprol. GDMT limited by orthostatic hypotension with SBPs down into the 70s. Can consider repeat echo once NSR vs rate control is established.  Chest pain MPI as above. No chest pain reported. He does have coronary/aortic calcifications. No ASA with Eliquis. Continue Zetia. Not on statin 2/2 liver cirrhosis. Continue BB.   History of falls/syncope No further syncope, but is having suspected seizures.  Recent CTA negative for PE, carotids without significant stenosis. Im sure that aflutter RVR and orthostatic hypotension do not help with this.   Seizures Wfie reports seizures are worsening. No prior history of seizures. Patient sees PCP next week.  Alcohol use Cirrhosis of the liver/elevated LFTs No significant volume overload on exam. Continue lasix and spironolactone. ETOH cessation advised. Most recent AST 53, ALT 21. Follows with PCP.   Orthostatic hypotension Orthostatics positive. He has been on midodrine 10mg  TID since hospital discarhge. He is also on Torpol 50mg  daily, spiro, and lasix   Disposition: Follow up with Dr.    Signed, Antoine Fiallos , PA-C  07/05/2021 7:35 AM    [facility]

## 2021-07-04 ENCOUNTER — Encounter: Payer: Self-pay | Admitting: Medical

## 2021-07-04 ENCOUNTER — Ambulatory Visit (INDEPENDENT_AMBULATORY_CARE_PROVIDER_SITE_OTHER): Payer: Medicare Other | Admitting: Medical

## 2021-07-04 ENCOUNTER — Other Ambulatory Visit: Payer: Self-pay

## 2021-07-04 VITALS — BP 111/69 | HR 127 | Ht 68.0 in

## 2021-07-04 DIAGNOSIS — I4891 Unspecified atrial fibrillation: Secondary | ICD-10-CM | POA: Diagnosis not present

## 2021-07-04 DIAGNOSIS — R079 Chest pain, unspecified: Secondary | ICD-10-CM

## 2021-07-04 DIAGNOSIS — I4892 Unspecified atrial flutter: Secondary | ICD-10-CM

## 2021-07-04 DIAGNOSIS — Z87898 Personal history of other specified conditions: Secondary | ICD-10-CM

## 2021-07-04 DIAGNOSIS — Z9181 History of falling: Secondary | ICD-10-CM

## 2021-07-04 DIAGNOSIS — Z789 Other specified health status: Secondary | ICD-10-CM | POA: Diagnosis not present

## 2021-07-04 DIAGNOSIS — I5022 Chronic systolic (congestive) heart failure: Secondary | ICD-10-CM

## 2021-07-04 DIAGNOSIS — K7030 Alcoholic cirrhosis of liver without ascites: Secondary | ICD-10-CM

## 2021-07-04 DIAGNOSIS — I959 Hypotension, unspecified: Secondary | ICD-10-CM

## 2021-07-04 NOTE — Patient Instructions (Signed)
Medication Instructions:  No changes at this time.  *If you need a refill on your cardiac medications before your next appointment, please call your pharmacy*   Lab Work: None  If you have labs (blood work) drawn today and your tests are completely normal, you will receive your results only by: MyChart Message (if you have MyChart) OR A paper copy in the mail If you have any lab test that is abnormal or we need to change your treatment, we will call you to review the results.   Testing/Procedures: None   Follow-Up: At [facility], you and your health needs are our priority.  As part of our continuing mission to provide you with exceptional heart care, we have created designated Provider Care Teams.  These Care Teams include your primary Cardiologist (physician) and Advanced Practice Providers (APPs -  Physician Assistants and Nurse Practitioners) who all work together to provide you with the care you need, when you need it.   Your next appointment:   Keep upcoming appointment with Dr. Kirke Corin 07/13/21 at 4:00 pm  Reschedule appointment with Dr. Lalla Brothers

## 2021-07-13 ENCOUNTER — Ambulatory Visit: Payer: Medicare Other | Admitting: Cardiovascular Disease

## 2021-07-31 ENCOUNTER — Other Ambulatory Visit: Payer: Self-pay | Admitting: Neurology

## 2021-07-31 ENCOUNTER — Other Ambulatory Visit (HOSPITAL_COMMUNITY): Payer: Self-pay | Admitting: Neurology

## 2021-07-31 DIAGNOSIS — R569 Unspecified convulsions: Secondary | ICD-10-CM

## 2021-08-09 ENCOUNTER — Other Ambulatory Visit: Payer: Self-pay

## 2021-08-09 ENCOUNTER — Ambulatory Visit
Admission: RE | Admit: 2021-08-09 | Discharge: 2021-08-09 | Disposition: A | Discharge status: Home / Self Care | Payer: Medicare Other | Source: Ambulatory Visit | Attending: Neurology | Admitting: Neurology

## 2021-08-09 DIAGNOSIS — R569 Unspecified convulsions: Secondary | ICD-10-CM | POA: Insufficient documentation

## 2021-11-02 ENCOUNTER — Other Ambulatory Visit: Payer: Self-pay

## 2021-11-08 ENCOUNTER — Other Ambulatory Visit: Payer: Self-pay | Admitting: Gastroenterology

## 2021-11-08 DIAGNOSIS — K766 Portal hypertension: Secondary | ICD-10-CM

## 2021-11-08 DIAGNOSIS — K7682 Hepatic encephalopathy: Secondary | ICD-10-CM

## 2021-11-08 DIAGNOSIS — D696 Thrombocytopenia, unspecified: Secondary | ICD-10-CM

## 2021-11-08 DIAGNOSIS — K7031 Alcoholic cirrhosis of liver with ascites: Secondary | ICD-10-CM

## 2022-01-25 ENCOUNTER — Encounter: Payer: Self-pay | Admitting: Cardiovascular Disease

## 2022-01-25 ENCOUNTER — Ambulatory Visit (INDEPENDENT_AMBULATORY_CARE_PROVIDER_SITE_OTHER): Payer: Medicare Other | Admitting: Cardiovascular Disease

## 2022-01-25 VITALS — BP 110/60 | HR 118 | Ht 68.0 in | Wt 225.0 lb

## 2022-01-25 DIAGNOSIS — I5022 Chronic systolic (congestive) heart failure: Secondary | ICD-10-CM

## 2022-01-25 DIAGNOSIS — I482 Chronic atrial fibrillation, unspecified: Secondary | ICD-10-CM | POA: Diagnosis not present

## 2022-01-25 NOTE — Progress Notes (Signed)
?  ?Cardiology Office Note ? ? ?Date:  01/25/2022  ? ?ID:  Lawrence Osborn, DOB 1951-04-30, MRN [id number] ? ?PCP:  Danella Penton, MD  ?Cardiologist:   Lorine Bears, MD  ? ?Chief Complaint  ?Patient presents with  ? Other  ?  OD f/u no complaints today. Meds reviewed verbally with pt.  ? ? ?  ?History of Present Illness: ?Lawrence Osborn is a 71 y.o. male who presents for follow-up visit regarding atrial flutter/fibrillation.  He has known history of type 2 diabetes, previous bilateral pulmonary embolism, hypothyroidism, alcoholic liver cirrhosis and hyperlipidemia. ?He was diagnosed with atrial fibrillation in 2019 in the setting of alcohol intoxication.  Echocardiogram showed normal LV systolic function.  He was hospitalized in September 2022 due to recurrent syncopal episodes due to suspected orthostatic hypotension and tachycardia.  He was hypertensive on presentation and tachycardic.  CTA was negative for pulmonary embolism.  His syncope was felt to be due to hypotension in the setting of suboptimally controlled ventricular rate.  He was treated with rate control.  Digoxin was added.  Echocardiogram showed an ejection fraction of 45 to 50%, moderately dilated right atrium mildly reduced RV systolic function with normal pulmonary pressure. ?Subsequent Lexiscan Myoview done in October 2022 showed no evidence of ischemia with normal ejection fraction. ?He was hospitalized at [facility] in December after an abnormal EEG and possible seizure activity.  He was thought to have toxic metabolic encephalopathy in the setting of decompensated liver disease and noncompliance with lactulose.  Eliquis was discontinued due to platelet count of less than 50,000. ?He has been doing reasonably well and his mental status improved with using lactulose.  No chest pain or worsening dyspnea.  He is chronically tachycardic with a heart rate around 110 bpm.  He takes digoxin 0.125 mg once daily and has not missed any dose. ? ?Past Medical  History:  ?Diagnosis Date  ? Anemia   ? Cirrhosis of liver (HCC)   ? COPD (chronic obstructive pulmonary disease) (HCC)   ? Diabetes mellitus without complication (HCC)   ? DVT (deep venous thrombosis) (HCC)   ? mid calf to groin  ? GI bleed   ? Hypertension   ? Peritonitis (HCC)   ? Pneumothorax   ? ? ?Past Surgical History:  ?Procedure Laterality Date  ? APPENDECTOMY    ? COLONOSCOPY    ? DORSAL SLIT N/A 09/20/2020  ? Procedure: DORSAL SLIT;  Surgeon: Riki Altes, MD;  Location: [facility] ORS;  Service: Urology;  Laterality: N/A;  ? HERNIA REPAIR    ? ? ? ?Current Outpatient Medications  ?Medication Sig Dispense Refill  ? albuterol (PROVENTIL HFA;VENTOLIN HFA) 108 (90 Base) MCG/ACT inhaler Inhale 2 puffs into the lungs every 6 (six) hours as needed for wheezing or shortness of breath.    ? apixaban (ELIQUIS) 5 MG TABS tablet Take 1 tablet (5 mg total) by mouth 2 (two) times daily. 60 tablet   ? Cyanocobalamin (B-12) 2500 MCG TABS Take 2,500 mcg by mouth daily.    ? digoxin (LANOXIN) 0.125 MG tablet Take 1 tablet (0.125 mg total) by mouth daily. 30 tablet 5  ? ezetimibe (ZETIA) 10 MG tablet Take 1 tablet (10 mg total) by mouth daily. 90 tablet 3  ? ferrous gluconate (FERGON) 324 MG tablet Take 324 mg by mouth daily with breakfast.    ? furosemide (LASIX) 20 MG tablet Take 2 tablets (40 mg total) by mouth 2 (two) times daily. 60 tablet 0  ?  insulin glargine (LANTUS) 100 UNIT/ML injection Inject 35 Units into the skin 2 (two) times daily.    ? lactulose (CHRONULAC) 10 GM/15ML solution Take 20 g by mouth 2 (two) times daily.    ? levothyroxine (SYNTHROID, LEVOTHROID) 100 MCG tablet Take 100 mcg by mouth daily before breakfast.    ? metoprolol tartrate (LOPRESSOR) 25 MG tablet Take 1 tablet (25 mg total) by mouth 2 (two) times daily. 60 tablet 0  ? midodrine (PROAMATINE) 10 MG tablet Take 1 tablet (10 mg total) by mouth 3 (three) times daily with meals. 30 tablet 0  ? omega-3 fish oil (MAXEPA) 1000 MG CAPS capsule Take  1,000 mg by mouth 2 (two) times daily.    ? omeprazole (PRILOSEC) 20 MG capsule Take 20 mg by mouth daily.    ? QUEtiapine (SEROQUEL) 100 MG tablet Take 0.5 tablets (50 mg total) by mouth at bedtime. Take 100 mg in the morning and 200 mg at bedtime (Patient taking differently: Take 100 mg by mouth at bedtime.)    ? spironolactone (ALDACTONE) 50 MG tablet Take 50 mg by mouth daily.    ? ST JOHNS WORT PO Take 2 tablets by mouth 2 (two) times daily.    ? traMADol (ULTRAM) 50 MG tablet Take 50 mg by mouth 2 (two) times daily.    ? zinc sulfate 220 (50 Zn) MG capsule Take by mouth daily.    ? ?No current facility-administered medications for this visit.  ? ? ?Allergies:   Patient has no known allergies.  ? ? ?Social History:  The patient  reports that he has quit smoking. He has never used smokeless tobacco. He reports current alcohol use of about 22.0 standard drinks per week. He reports that he does not currently use drugs.  ? ?Family History:  The patient's family history includes CAD in his father; Hypothyroidism in his mother.  ? ? ?ROS:  Please see the history of present illness.   Otherwise, review of systems are positive for none.   All other systems are reviewed and negative.  ? ? ?PHYSICAL EXAM: ?VS:  BP 110/60 (BP Location: Left Arm, Patient Position: Sitting, Cuff Size: Normal)   Pulse (!) 118   Ht 5\' 8"  (1.727 m)   Wt 225 lb (102.1 kg)   SpO2 98%   BMI 34.21 kg/m?  , BMI Body mass index is 34.21 kg/m?. ?GEN: Well nourished, well developed, in no acute distress  ?HEENT: normal  ?Neck: no JVD, carotid bruits, or masses ?Cardiac: Irregularly irregular and tachycardic; no murmurs, rubs, or gallops, mild bilateral leg edema ?Respiratory:  clear to auscultation bilaterally, normal work of breathing ?GI: soft, nontender, nondistended, + BS ?MS: no deformity or atrophy  ?Skin: warm and dry, no rash ?Neuro:  Strength and sensation are intact ?Psych: euthymic mood, full affect ? ? ?EKG:  EKG is ordered  today. ?The ekg ordered today demonstrates atypical atrial flutter with ventricular rate of 118 bpm.  Right bundle branch block ? ? ?Recent Labs: ?05/18/2021: B Natriuretic Peptide 318.3 ?05/19/2021: TSH 4.994 ?05/21/2021: Magnesium 1.8 ?06/23/2021: ALT 21; BUN 8; Creatinine, Ser 0.71; Hemoglobin 15.5; Platelets 63; Potassium 4.1; Sodium 133  ? ? ?Lipid Panel ?No results found for: CHOL, TRIG, HDL, CHOLHDL, VLDL, LDLCALC, LDLDIRECT ?  ? ?Wt Readings from Last 3 Encounters:  ?01/25/22 225 lb (102.1 kg)  ?05/30/21 233 lb (105.7 kg)  ?05/22/21 251 lb 5.2 oz (114 kg)  ?  ? ? ? ?   ? View :  No data to display.  ?  ?  ?  ? ? ? ? ?ASSESSMENT AND PLAN: ? ?1.  Atrial flutter/fibrillation: He is no longer on anticoagulation due to liver cirrhosis and thrombocytopenia.  Our only option now is to continue rate control.  Not able to increase the dose of metoprolol due to low blood pressure.  I requested digoxin level.  If his digoxin level is still low, we will plan on increasing the dose of digoxin to 0.25 mg once daily. ? ?2.  Chronic systolic heart failure with mildly reduced ejection fraction: He appears to be euvolemic on spironolactone 50 mg daily and furosemide 40 mg twice daily. ? ?3.  History of syncope: Possibly due to seizures in the setting of toxic encephalopathy but he did have an episode that was highly suggestive of orthostatic hypotension. ? ?4.  Liver cirrhosis: The patient is going to have a EGD in the near future to check for esophageal varices. ? ? ? ?Disposition:   FU in 4 months. ? ?Signed, ? ?Lorine BearsMuhammad Immanuel Fedak, MD  ?01/25/2022 3:02 PM    ?[facility] ?

## 2022-01-25 NOTE — Patient Instructions (Signed)
Medication Instructions:  ?Your physician recommends that you continue on your current medications as directed. Please refer to the Current Medication list given to you today. ? ?*If you need a refill on your cardiac medications before your next appointment, please call your pharmacy* ? ? ?Lab Work: ?Digoxin level today ? ?Please stop at the [facility] to have your lab drawn. Stop at Registration desk to check in. ? ?If you have labs (blood work) drawn today and your tests are completely normal, you will receive your results only by: ?MyChart Message (if you have MyChart) OR ?A paper copy in the mail ?If you have any lab test that is abnormal or we need to change your treatment, we will call you to review the results. ? ? ?Testing/Procedures: ?None ordered ? ? ?Follow-Up: ?At [facility], you and your health needs are our priority.  As part of our continuing mission to provide you with exceptional heart care, we have created designated Provider Care Teams.  These Care Teams include your primary Cardiologist (physician) and Advanced Practice Providers (APPs -  Physician Assistants and Nurse Practitioners) who all work together to provide you with the care you need, when you need it. ? ?We recommend signing up for the patient portal called "MyChart".  Sign up information is provided on this After Visit Summary.  MyChart is used to connect with patients for Virtual Visits (Telemedicine).  Patients are able to view lab/test results, encounter notes, upcoming appointments, etc.  Non-urgent messages can be sent to your provider as well.   ?To learn more about what you can do with MyChart, go to [email or web address].   ? ?Your next appointment:   ?4 month(s) ? ?The format for your next appointment:   ?In Person ? ?Provider:   ?You may see Lorine Bears, MD or one of the following Advanced Practice Providers on your designated Care Team:   ?Nicolasa Ducking, NP ?Eula Listen, PA-C ?Cadence Fransico Michael, PA-C ? ?Other  Instructions ?N/A ? ?Important Information About Sugar ? ? ? ? ? ? ?

## 2022-01-30 ENCOUNTER — Encounter: Payer: Self-pay | Admitting: Internal Medicine

## 2022-01-31 ENCOUNTER — Encounter: Payer: Self-pay | Admitting: Internal Medicine

## 2022-01-31 ENCOUNTER — Ambulatory Visit: Payer: Medicare Other | Admitting: Certified Registered Nurse Anesthetist

## 2022-01-31 ENCOUNTER — Encounter
Admission: RE | Disposition: A | Discharge status: Home / Self Care | Payer: Self-pay | Source: Home / Self Care | Attending: Internal Medicine

## 2022-01-31 ENCOUNTER — Ambulatory Visit
Admission: RE | Admit: 2022-01-31 | Discharge: 2022-01-31 | Disposition: A | Discharge status: Home / Self Care | Payer: Medicare Other | Attending: Internal Medicine | Admitting: Internal Medicine

## 2022-01-31 DIAGNOSIS — E119 Type 2 diabetes mellitus without complications: Secondary | ICD-10-CM | POA: Diagnosis not present

## 2022-01-31 DIAGNOSIS — Z86718 Personal history of other venous thrombosis and embolism: Secondary | ICD-10-CM | POA: Insufficient documentation

## 2022-01-31 DIAGNOSIS — F32A Depression, unspecified: Secondary | ICD-10-CM | POA: Diagnosis not present

## 2022-01-31 DIAGNOSIS — Z87891 Personal history of nicotine dependence: Secondary | ICD-10-CM | POA: Insufficient documentation

## 2022-01-31 DIAGNOSIS — E785 Hyperlipidemia, unspecified: Secondary | ICD-10-CM | POA: Insufficient documentation

## 2022-01-31 DIAGNOSIS — I10 Essential (primary) hypertension: Secondary | ICD-10-CM | POA: Diagnosis not present

## 2022-01-31 DIAGNOSIS — I864 Gastric varices: Secondary | ICD-10-CM | POA: Diagnosis not present

## 2022-01-31 DIAGNOSIS — E039 Hypothyroidism, unspecified: Secondary | ICD-10-CM | POA: Diagnosis not present

## 2022-01-31 DIAGNOSIS — Z794 Long term (current) use of insulin: Secondary | ICD-10-CM | POA: Insufficient documentation

## 2022-01-31 DIAGNOSIS — I4891 Unspecified atrial fibrillation: Secondary | ICD-10-CM | POA: Diagnosis not present

## 2022-01-31 DIAGNOSIS — K766 Portal hypertension: Secondary | ICD-10-CM | POA: Diagnosis present

## 2022-01-31 DIAGNOSIS — J449 Chronic obstructive pulmonary disease, unspecified: Secondary | ICD-10-CM | POA: Diagnosis not present

## 2022-01-31 DIAGNOSIS — K3189 Other diseases of stomach and duodenum: Secondary | ICD-10-CM | POA: Diagnosis not present

## 2022-01-31 DIAGNOSIS — Z79899 Other long term (current) drug therapy: Secondary | ICD-10-CM | POA: Insufficient documentation

## 2022-01-31 DIAGNOSIS — G709 Myoneural disorder, unspecified: Secondary | ICD-10-CM | POA: Diagnosis not present

## 2022-01-31 DIAGNOSIS — K7460 Unspecified cirrhosis of liver: Secondary | ICD-10-CM | POA: Diagnosis not present

## 2022-01-31 DIAGNOSIS — I2510 Atherosclerotic heart disease of native coronary artery without angina pectoris: Secondary | ICD-10-CM | POA: Insufficient documentation

## 2022-01-31 DIAGNOSIS — M329 Systemic lupus erythematosus, unspecified: Secondary | ICD-10-CM | POA: Insufficient documentation

## 2022-01-31 HISTORY — PX: ESOPHAGOGASTRODUODENOSCOPY (EGD) WITH PROPOFOL: SHX5813

## 2022-01-31 HISTORY — DX: Systemic lupus erythematosus, unspecified: M32.9

## 2022-01-31 HISTORY — DX: Cardiac arrhythmia, unspecified: I49.9

## 2022-01-31 HISTORY — DX: Hyperlipidemia, unspecified: E78.5

## 2022-01-31 HISTORY — DX: Hypothyroidism, unspecified: E03.9

## 2022-01-31 LAB — CBC WITH DIFFERENTIAL/PLATELET
Abs Immature Granulocytes: 0.01 10*3/uL (ref 0.00–0.07)
Basophils Absolute: 0 10*3/uL (ref 0.0–0.1)
Basophils Relative: 1 %
Eosinophils Absolute: 0.2 10*3/uL (ref 0.0–0.5)
Eosinophils Relative: 6 %
HCT: 39 % (ref 39.0–52.0)
Hemoglobin: 13.8 g/dL (ref 13.0–17.0)
Immature Granulocytes: 0 %
Lymphocytes Relative: 17 %
Lymphs Abs: 0.5 10*3/uL — ABNORMAL LOW (ref 0.7–4.0)
MCH: 33.1 pg (ref 26.0–34.0)
MCHC: 35.4 g/dL (ref 30.0–36.0)
MCV: 93.5 fL (ref 80.0–100.0)
Monocytes Absolute: 0.4 10*3/uL (ref 0.1–1.0)
Monocytes Relative: 15 %
Neutro Abs: 1.8 10*3/uL (ref 1.7–7.7)
Neutrophils Relative %: 61 %
Platelets: 58 10*3/uL — ABNORMAL LOW (ref 150–400)
RBC: 4.17 MIL/uL — ABNORMAL LOW (ref 4.22–5.81)
RDW: 13.2 % (ref 11.5–15.5)
WBC: 3 10*3/uL — ABNORMAL LOW (ref 4.0–10.5)
nRBC: 0 % (ref 0.0–0.2)

## 2022-01-31 LAB — GLUCOSE, CAPILLARY: Glucose-Capillary: 121 mg/dL — ABNORMAL HIGH (ref 70–99)

## 2022-01-31 SURGERY — ESOPHAGOGASTRODUODENOSCOPY (EGD) WITH PROPOFOL
Anesthesia: General

## 2022-01-31 MED ORDER — PROPOFOL 10 MG/ML IV BOLUS
INTRAVENOUS | Status: DC | PRN
  Administered 2022-01-31: 20 mg via INTRAVENOUS
  Administered 2022-01-31: 60 mg via INTRAVENOUS
  Administered 2022-01-31: 20 mg via INTRAVENOUS

## 2022-01-31 MED ORDER — PROPOFOL 500 MG/50ML IV EMUL
INTRAVENOUS | Status: DC | PRN
  Administered 2022-01-31: 150 ug/kg/min via INTRAVENOUS

## 2022-01-31 MED ORDER — LIDOCAINE HCL (CARDIAC) PF 100 MG/5ML IV SOSY
PREFILLED_SYRINGE | INTRAVENOUS | Status: DC | PRN
  Administered 2022-01-31: 50 mg via INTRAVENOUS

## 2022-01-31 MED ORDER — SODIUM CHLORIDE 0.9 % IV SOLN
INTRAVENOUS | Status: DC
  Administered 2022-01-31: 1000 mL via INTRAVENOUS

## 2022-01-31 NOTE — Transfer of Care (Signed)
Immediate Anesthesia Transfer of Care Note ? ?Patient: Lawrence Osborn ? ?Procedure(s) Performed: ESOPHAGOGASTRODUODENOSCOPY (EGD) WITH PROPOFOL ? ?Patient Location: Endoscopy Unit ? ?Anesthesia Type:General ? ?Level of Consciousness: drowsy ? ?Airway & Oxygen Therapy: Patient Spontanous Breathing and Patient connected to face mask oxygen ? ?Post-op Assessment: Report given to RN and Post -op Vital signs reviewed and stable ? ?Post vital signs: Reviewed and stable ? ?Last Vitals:  ?Vitals Value Taken Time  ?BP    ?Temp    ?Pulse 113 01/31/22 1335  ?Resp 11 01/31/22 1335  ?SpO2 99 % 01/31/22 1335  ?Vitals shown include unvalidated device data. ? ?Last Pain:  ?Vitals:  ? 01/31/22 1220  ?TempSrc: Temporal  ?PainSc: 0-No pain  ?   ? ?  ? ?Complications: No notable events documented. ?

## 2022-01-31 NOTE — Op Note (Signed)
[facility] ?Gastroenterology ?Patient Name: Lawrence Osborn ?Procedure Date: 01/31/2022 1:00 PM ?MRN: [id number] ?Account #: [id number] ?Date of Birth: 01/01/1951 ?Admit Type: Outpatient ?Age: 71 ?[facility] ?Gender: Male ?Note Status: Finalized ?Instrument Name: Upper Endoscope [id number] ?Procedure:             Upper GI endoscopy ?Indications:           Portal venous hypertension ?Providers:             Boykin Nearingeodoro K. Norma Fredricksonoledo MD, MD ?Referring MD:          Danella PentonMark F. Miller, MD (Referring MD) ?Medicines:             Propofol per Anesthesia ?Complications:         No immediate complications. ?Procedure:             Pre-Anesthesia Assessment: ?                       - The risks and benefits of the procedure and the  ?                       sedation options and risks were discussed with the  ?                       patient. All questions were answered and informed  ?                       consent was obtained. ?                       - Patient identification and proposed procedure were  ?                       verified prior to the procedure by the nurse. The  ?                       procedure was verified in the procedure room. ?                       - ASA Grade Assessment: III - A patient with severe  ?                       systemic disease. ?                       - After reviewing the risks and benefits, the patient  ?                       was deemed in satisfactory condition to undergo the  ?                       procedure. ?                       After obtaining informed consent, the endoscope was  ?                       passed under direct vision. Throughout the procedure,  ?  the patient's blood pressure, pulse, and oxygen  ?                       saturations were monitored continuously. The Endoscope  ?                       was introduced through the mouth, and advanced to the  ?                       third part of duodenum. The upper GI endoscopy was  ?                        accomplished without difficulty. The patient tolerated  ?                       the procedure well. ?Findings: ?     The examined esophagus was normal. ?     Moderate portal hypertensive gastropathy was found in the entire  ?     examined stomach. ?     Varices with no bleeding were found in the gastric fundus. There were no  ?     stigmata of recent bleeding. They were 20 mm in largest diameter. ?     The examined duodenum was normal. ?     The exam was otherwise without abnormality. ?Impression:            - Normal esophagus. ?                       - Portal hypertensive gastropathy. ?                       - Type 1 isolated gastric varices (IGV1, varices  ?                       located in the fundus), without bleeding. ?                       - Normal examined duodenum. ?                       - The examination was otherwise normal. ?                       - No specimens collected. ?Recommendation:        - Patient has a contact number available for  ?                       emergencies. The signs and symptoms of potential  ?                       delayed complications were discussed with the patient.  ?                       Return to normal activities tomorrow. Written  ?                       discharge instructions were provided to the patient. ?                       - Resume  previous diet. ?                       - Continue present medications. ?                       - Give a beta blocker with dosage titrated by the  ?                       heart rate. ?                       - Return to physician assistant in 4 weeks. ?                       - Follow up with Jacob Moores, PA-C in the GI office.  ?                       [phone] ?                       - Telephone GI office to schedule appointment in 1  ?                       week. ?Procedure Code(s):     --- Professional --- ?                       [phone], Esophagogastroduodenoscopy, flexible,  ?                       transoral; diagnostic,  including collection of  ?                       specimen(s) by brushing or washing, when performed  ?                       (separate procedure) ?Diagnosis Code(s):     --- Professional --- ?                       I86.4, Gastric varices ?                       K31.89, Other diseases of stomach and duodenum ?                       K76.6, Portal hypertension ?CPT copyright 2019 American Medical Association. All rights reserved. ?The codes documented in this report are preliminary and upon coder review may  ?be revised to meet current compliance requirements. ?Stanton Kidney MD, MD ?01/31/2022 1:39:13 PM ?This report has been signed electronically. ?Number of Addenda: 0 ?Note Initiated On: 01/31/2022 1:00 PM ?Estimated Blood Loss:  Estimated blood loss: none. Estimated blood loss: none. ?     [facility] ?

## 2022-01-31 NOTE — Interval H&P Note (Signed)
History and Physical Interval Note: ? ?01/31/2022 ?1:20 PM ? ?Lawrence Osborn  has presented today for surgery, with the diagnosis of K70.31  - Alcoholic cirrhosis of liver with ascites  ?D69.59 - Thrombocytopenia, secondary ?K76.82  - Encephalopathy, hepatic ?K76.6  - Portal venous hypertension.  The various methods of treatment have been discussed with the patient and family. After consideration of risks, benefits and other options for treatment, the patient has consented to  Procedure(s) with comments: ?ESOPHAGOGASTRODUODENOSCOPY (EGD) WITH PROPOFOL (N/A) - IDDM as a surgical intervention.  The patient's history has been reviewed, patient examined, no change in status, stable for surgery.  I have reviewed the patient's chart and labs.  Questions were answered to the patient's satisfaction.   ? ? ?[place], [place] ? ? ?

## 2022-01-31 NOTE — Anesthesia Procedure Notes (Signed)
Procedure Name: [place] ?Date/Time: 01/31/2022 1:19 PM ?Performed by: Tollie Eth, CRNA ?Pre-anesthesia Checklist: Patient identified, Emergency Drugs available, Suction available and Patient being monitored ?Patient Re-evaluated:Patient Re-evaluated prior to induction ?Oxygen Delivery Method: Simple face mask ?Induction Type: IV induction ?Placement Confirmation: positive ETCO2 ? ? ? ? ?

## 2022-01-31 NOTE — Anesthesia Preprocedure Evaluation (Signed)
Anesthesia Evaluation  ?Patient identified by MRN, date of birth, ID band ?Patient awake ? ? ? ?Reviewed: ?Allergy & Precautions, H&P , NPO status , Patient's Chart, lab work & pertinent test results, reviewed documented beta blocker date and time  ? ?Airway ?Mallampati: II ? ? ?Neck ROM: full ? ? ? Dental ? ?(+) Poor Dentition ?  ?Pulmonary ?COPD, former smoker,  ?  ?Pulmonary exam normal ? ? ? ? ? ? ? Cardiovascular ?Exercise Tolerance: Poor ?hypertension, On Medications ?+ CAD  ?Normal cardiovascular exam+ dysrhythmias  ?Rhythm:regular Rate:Normal ? ? ?  ?Neuro/Psych ?Depression  Neuromuscular disease negative psych ROS  ? GI/Hepatic ?negative GI ROS, Neg liver ROS,   ?Endo/Other  ?diabetes, Well ControlledHypothyroidism  ? Renal/GU ?negative Renal ROS  ?negative genitourinary ?  ?Musculoskeletal ? ? Abdominal ?  ?Peds ? Hematology ?negative hematology ROS ?(+)   ?Anesthesia Other Findings ?Past Medical History: ?No date: Anemia ?No date: Cirrhosis of liver (HCC) ?No date: COPD (chronic obstructive pulmonary disease) (HCC) ?No date: Diabetes mellitus without complication (HCC) ?No date: DVT (deep venous thrombosis) (HCC) ?    Comment:  mid calf to groin ?No date: Dysrhythmia ?    Comment:  atrial fibrillation ?No date: GI bleed ?No date: HLD (hyperlipidemia) ?No date: Hypertension ?No date: Hypothyroidism ?No date: Lupus (systemic lupus erythematosus) (HCC) ?No date: Peritonitis (HCC) ?No date: Pneumothorax ?Past Surgical History: ?No date: APPENDECTOMY ?No date: COLONOSCOPY ?09/20/2020: DORSAL SLIT; N/A ?    Comment:  Procedure: DORSAL SLIT;  Surgeon: Riki Altes, MD;  ?             Location: [facility] ORS;  Service: Urology;  Laterality: N/A; ?No date: HERNIA REPAIR ? ? Reproductive/Obstetrics ?negative OB ROS ? ?  ? ? ? ? ? ? ? ? ? ? ? ? ? ?  ?  ? ? ? ? ? ? ? ? ?Anesthesia Physical ?Anesthesia Plan ? ?ASA: 4 ? ?Anesthesia Plan: General  ? ?Post-op Pain Management:    ? ?Induction:  ? ?PONV Risk Score and Plan:  ? ?Airway Management Planned:  ? ?Additional Equipment:  ? ?Intra-op Plan:  ? ?Post-operative Plan:  ? ?Informed Consent: I have reviewed the patients History and Physical, chart, labs and discussed the procedure including the risks, benefits and alternatives for the proposed anesthesia with the patient or authorized representative who has indicated his/her understanding and acceptance.  ? ? ? ?Dental Advisory Given ? ?Plan Discussed with: CRNA ? ?Anesthesia Plan Comments:   ? ? ? ? ? ? ?Anesthesia Quick Evaluation ? ?

## 2022-01-31 NOTE — H&P (Signed)
Outpatient short stay form Pre-procedure ?01/31/2022 1:17 PM ?Lawrence Osborn K. Norma Fredrickson, M.D. ? ?Primary Physician: Bethann Punches, M.D. ? ?Reason for visit:  Portal venous hypertension, cirrhosis, ascites, hx of esophageal varices. ? ?History of present illness:   ?Patient with personal history of esophageal varices and cirrhosis presents for secondary prevention of bleeding. Patient denies intractable heartburn, dysphagia, hemetemesis, abdominal pain, nausea or vomiting.  ? ? ? ?Current Facility-Administered Medications:  ?  0.9 %  sodium chloride infusion, , Intravenous, Continuous, [place], Boykin Nearing, MD, Last Rate: 20 mL/hr at 01/31/22 1314, Continued from Pre-op at 01/31/22 1314 ? ?Medications Prior to Admission  ?Medication Sig Dispense Refill Last Dose  ? Cyanocobalamin (B-12) 2500 MCG TABS Take 2,500 mcg by mouth daily.   01/30/2022  ? ferrous gluconate (FERGON) 324 MG tablet Take 324 mg by mouth daily with breakfast.   Past Week  ? furosemide (LASIX) 20 MG tablet Take 2 tablets (40 mg total) by mouth 2 (two) times daily. 60 tablet 0 01/30/2022  ? insulin glargine (LANTUS) 100 UNIT/ML injection Inject 35 Units into the skin 2 (two) times daily.   01/30/2022  ? lactulose (CHRONULAC) 10 GM/15ML solution Take 20 g by mouth 2 (two) times daily.   01/30/2022  ? levETIRAcetam (KEPPRA XR) 500 MG 24 hr tablet Take 750 mg by mouth daily.   01/30/2022  ? levothyroxine (SYNTHROID, LEVOTHROID) 100 MCG tablet Take 100 mcg by mouth daily before breakfast.   01/30/2022  ? metoprolol tartrate (LOPRESSOR) 25 MG tablet Take 1 tablet (25 mg total) by mouth 2 (two) times daily. 60 tablet 0 01/30/2022  ? midodrine (PROAMATINE) 10 MG tablet Take 1 tablet (10 mg total) by mouth 3 (three) times daily with meals. 30 tablet 0 01/30/2022  ? omega-3 fish oil (MAXEPA) 1000 MG CAPS capsule Take 1,000 mg by mouth 2 (two) times daily.   Past Week  ? omeprazole (PRILOSEC) 20 MG capsule Take 20 mg by mouth daily.   01/30/2022  ? polyethylene glycol (MIRALAX /  GLYCOLAX) 17 g packet Take 17 g by mouth 2 (two) times daily.   01/30/2022  ? spironolactone (ALDACTONE) 50 MG tablet Take 50 mg by mouth daily.   01/30/2022  ? albuterol (PROVENTIL HFA;VENTOLIN HFA) 108 (90 Base) MCG/ACT inhaler Inhale 2 puffs into the lungs every 6 (six) hours as needed for wheezing or shortness of breath.    at prn  ? digoxin (LANOXIN) 0.125 MG tablet Take 1 tablet (0.125 mg total) by mouth daily. 30 tablet 5   ? ezetimibe (ZETIA) 10 MG tablet Take 1 tablet (10 mg total) by mouth daily. 90 tablet 3   ? QUEtiapine (SEROQUEL) 100 MG tablet Take 0.5 tablets (50 mg total) by mouth at bedtime. Take 100 mg in the morning and 200 mg at bedtime (Patient taking differently: Take 100 mg by mouth at bedtime.)     ? [facility] [place] WORT PO Take 2 tablets by mouth 2 (two) times daily. (Patient not taking: Reported on 01/31/2022)   Not Taking  ? traMADol (ULTRAM) 50 MG tablet Take 50 mg by mouth 2 (two) times daily. (Patient not taking: Reported on 01/31/2022)   Not Taking  ? zinc sulfate 220 (50 Zn) MG capsule Take by mouth daily. (Patient not taking: Reported on 01/31/2022)   Not Taking  ? ? ? ?No Known Allergies ? ? ?Past Medical History:  ?Diagnosis Date  ? Anemia   ? Cirrhosis of liver (HCC)   ? COPD (chronic obstructive pulmonary disease) (HCC)   ?  Diabetes mellitus without complication (HCC)   ? DVT (deep venous thrombosis) (HCC)   ? mid calf to groin  ? Dysrhythmia   ? atrial fibrillation  ? GI bleed   ? HLD (hyperlipidemia)   ? Hypertension   ? Hypothyroidism   ? Lupus (systemic lupus erythematosus) (HCC)   ? Peritonitis (HCC)   ? Pneumothorax   ? ? ?Review of systems:  Otherwise negative.  ? ? ?Physical Exam ? ?Gen: Alert, oriented. Appears stated age.  ?HEENT: [place]/AT. PERRLA. ?Lungs: CTA, no wheezes. ?CV: RR nl S1, S2. ?Abd: soft, benign, no masses. BS+ ?Ext: No edema. Pulses 2+ ? ? ? ?Planned procedures: Proceed with EGD The patient understands the nature of the planned procedure, indications, risks,  alternatives and potential complications including but not limited to bleeding, infection, perforation, damage to internal organs and possible oversedation/side effects from anesthesia. The patient agrees and gives consent to proceed.  ?Please refer to procedure notes for findings, recommendations and patient disposition/instructions.  ? ? ? ?Eithel Ryall K. Norma Fredrickson, M.D. ?Gastroenterology ?01/31/2022  1:17 PM ? ? ? ? ? ? ?

## 2022-02-01 NOTE — Anesthesia Postprocedure Evaluation (Signed)
Anesthesia Post Note  Patient: Treshun Wold  Procedure(s) Performed: ESOPHAGOGASTRODUODENOSCOPY (EGD) WITH PROPOFOL  Patient location during evaluation: PACU Anesthesia Type: General Level of consciousness: awake and alert Pain management: pain level controlled Vital Signs Assessment: post-procedure vital signs reviewed and stable Respiratory status: spontaneous breathing, nonlabored ventilation, respiratory function stable and patient connected to nasal cannula oxygen Cardiovascular status: blood pressure returned to baseline and stable Postop Assessment: no apparent nausea or vomiting Anesthetic complications: no   No notable events documented.   Last Vitals:  Vitals:   01/31/22 1220 01/31/22 1335  BP: 93/69 112/77  Pulse: (!) 120   Resp: 18   Temp: 36.8 C 36.7 C  SpO2: 100%     Last Pain:  Vitals:   01/31/22 1343  TempSrc:   PainSc: 0-No pain                 Yevette Edwards

## 2022-02-02 ENCOUNTER — Encounter: Payer: Self-pay | Admitting: Internal Medicine

## 2022-02-19 ENCOUNTER — Other Ambulatory Visit
Admission: RE | Admit: 2022-02-19 | Discharge: 2022-02-19 | Disposition: A | Discharge status: Home / Self Care | Payer: Medicare Other | Source: Ambulatory Visit | Attending: Cardiovascular Disease | Admitting: Cardiovascular Disease

## 2022-02-19 DIAGNOSIS — I482 Chronic atrial fibrillation, unspecified: Secondary | ICD-10-CM | POA: Diagnosis present

## 2022-02-19 DIAGNOSIS — I5022 Chronic systolic (congestive) heart failure: Secondary | ICD-10-CM | POA: Diagnosis present

## 2022-02-19 LAB — DIGOXIN LEVEL: Digoxin Level: 0.4 ng/mL — ABNORMAL LOW (ref 0.8–2.0)

## 2022-02-22 ENCOUNTER — Telehealth: Payer: Self-pay | Admitting: Cardiovascular Disease

## 2022-02-22 DIAGNOSIS — I482 Chronic atrial fibrillation, unspecified: Secondary | ICD-10-CM

## 2022-02-22 DIAGNOSIS — Z79899 Other long term (current) drug therapy: Secondary | ICD-10-CM

## 2022-02-22 NOTE — Telephone Encounter (Signed)
Iran Ouch, MD  02/21/2022  5:33 PM EDT     Digoxin level is low.  Recommend increasing digoxin to 0.25 mg once daily.  Repeat digoxin level in 1 month.

## 2022-02-22 NOTE — Telephone Encounter (Signed)
Attempted to call the patient. No answer- I left a message to please call back.  

## 2022-02-22 NOTE — Telephone Encounter (Signed)
Spouse is returning call

## 2022-02-23 MED ORDER — DIGOXIN 250 MCG PO TABS: 0.2500 mg | ORAL_TABLET | Freq: Every day | ORAL | 1 refills | Status: DC

## 2022-02-23 NOTE — Telephone Encounter (Signed)
I called and spoke with Dois Davenport (ok per [facility]). I have advised her of the patient's lab results- digoxin level is low at 0.4.  Per Dr. Kirke Corin: 1) increase digoxin to 0.25 mg once daily 2) repeat a digoxin level in 1 month  Dois Davenport voices understanding of all of the above and is agreeable. She will have the patient have a repeat digoxin level in ~ 1  month at the [facility].  She was very appreciative of the call back.

## 2022-03-07 DIAGNOSIS — G40909 Epilepsy, unspecified, not intractable, without status epilepticus: Secondary | ICD-10-CM

## 2022-05-29 ENCOUNTER — Ambulatory Visit: Payer: Medicare Other | Admitting: Physician Assistant

## 2022-05-29 ENCOUNTER — Encounter: Payer: Self-pay | Admitting: Cardiovascular Disease

## 2022-05-29 ENCOUNTER — Ambulatory Visit: Payer: Medicare Other | Attending: Physician Assistant | Admitting: Cardiovascular Disease

## 2022-05-29 VITALS — BP 90/70 | HR 115 | Ht 68.0 in | Wt 200.0 lb

## 2022-05-29 DIAGNOSIS — I5022 Chronic systolic (congestive) heart failure: Secondary | ICD-10-CM | POA: Insufficient documentation

## 2022-05-29 DIAGNOSIS — I4891 Unspecified atrial fibrillation: Secondary | ICD-10-CM | POA: Diagnosis not present

## 2022-05-29 DIAGNOSIS — Z87898 Personal history of other specified conditions: Secondary | ICD-10-CM | POA: Diagnosis not present

## 2022-05-29 DIAGNOSIS — I4892 Unspecified atrial flutter: Secondary | ICD-10-CM | POA: Insufficient documentation

## 2022-05-29 NOTE — Progress Notes (Signed)
Cardiology Office Note   Date:  05/29/2022   ID:  Lawrence Osborn, DOB 21-Jun-1951, MRN [id number]  PCP:  Lawrence Penton, MD  Cardiologist:   Lawrence Bears, MD   Chief Complaint  Patient presents with   Follow-up    No new cardiac concerns      History of Present Illness: Lawrence Osborn is a 71 y.o. male who presents for follow-up visit regarding atrial flutter/fibrillation.  He has known history of type 2 diabetes, previous bilateral pulmonary embolism, hypothyroidism, alcoholic liver cirrhosis and hyperlipidemia. He was diagnosed with atrial fibrillation in 2019 in the setting of alcohol intoxication.  Echocardiogram showed normal LV systolic function.  He was hospitalized in September 2022 due to recurrent syncopal episodes due to suspected orthostatic hypotension and tachycardia.  He was hypertensive on presentation and tachycardic.  CTA was negative for pulmonary embolism.  His syncope was felt to be due to hypotension in the setting of suboptimally controlled ventricular rate.  He was treated with rate control.  Digoxin was added.  Echocardiogram showed an ejection fraction of 45 to 50%, moderately dilated right atrium mildly reduced RV systolic function with normal pulmonary pressure. Subsequent Lexiscan Myoview done in October 2022 showed no evidence of ischemia with normal ejection fraction. He was hospitalized at [facility] in December after an abnormal EEG and possible seizure activity.  He was thought to have toxic metabolic encephalopathy in the setting of decompensated liver disease and noncompliance with lactulose.  Eliquis was discontinued due to platelet count of less than 50,000. During last visit, I increased his digoxin to 0.25 mg once daily.  He was hospitalized in June at [facility] with toxic metabolic encephalopathy and possible seizures.  He continues to be tachycardic but denies chest pain or worsening dyspnea.  No palpitations.  Past Medical History:  Diagnosis Date   Anemia     Cirrhosis of liver (HCC)    COPD (chronic obstructive pulmonary disease) (HCC)    Diabetes mellitus without complication (HCC)    DVT (deep venous thrombosis) (HCC)    mid calf to groin   Dysrhythmia    atrial fibrillation   GI bleed    HLD (hyperlipidemia)    Hypertension    Hypothyroidism    Lupus (systemic lupus erythematosus) (HCC)    Peritonitis (HCC)    Pneumothorax     Past Surgical History:  Procedure Laterality Date   APPENDECTOMY     COLONOSCOPY     DORSAL SLIT N/A 09/20/2020   Procedure: DORSAL SLIT;  Surgeon: Lawrence Altes, MD;  Location: [facility] ORS;  Service: Urology;  Laterality: N/A;   ESOPHAGOGASTRODUODENOSCOPY (EGD) WITH PROPOFOL N/A 01/31/2022   Procedure: ESOPHAGOGASTRODUODENOSCOPY (EGD) WITH PROPOFOL;  Surgeon: Lawrence, Boykin Nearing, MD;  Location: [facility] ENDOSCOPY;  Service: Gastroenterology;  Laterality: N/A;  IDDM   HERNIA REPAIR       Current Outpatient Medications  Medication Sig Dispense Refill   albuterol (PROVENTIL HFA;VENTOLIN HFA) 108 (90 Base) MCG/ACT inhaler Inhale 2 puffs into the lungs every 6 (six) hours as needed for wheezing or shortness of breath.     Cyanocobalamin (B-12) 2500 MCG TABS Take 2,500 mcg by mouth daily.     digoxin (LANOXIN) 0.25 MG tablet Take 1 tablet (0.25 mg total) by mouth daily. 90 tablet 1   ezetimibe (ZETIA) 10 MG tablet Take 1 tablet (10 mg total) by mouth daily. 90 tablet 3   ferrous gluconate (FERGON) 324 MG tablet Take 324 mg by mouth daily with breakfast.  furosemide (LASIX) 20 MG tablet Take 2 tablets (40 mg total) by mouth 2 (two) times daily. 60 tablet 0   insulin glargine (LANTUS) 100 UNIT/ML injection Inject 35 Units into the skin 2 (two) times daily.     lactulose (CHRONULAC) 10 GM/15ML solution Take 20 g by mouth 2 (two) times daily.     levETIRAcetam (KEPPRA XR) 500 MG 24 hr tablet Take 750 mg by mouth daily.     levothyroxine (SYNTHROID, LEVOTHROID) 100 MCG tablet Take 100 mcg by mouth daily before  breakfast.     midodrine (PROAMATINE) 10 MG tablet Take 1 tablet (10 mg total) by mouth 3 (three) times daily with meals. 30 tablet 0   nadolol (CORGARD) 40 MG tablet Take 40 mg by mouth daily.     omega-3 fish oil (MAXEPA) 1000 MG CAPS capsule Take 1,000 mg by mouth 2 (two) times daily.     omeprazole (PRILOSEC) 20 MG capsule Take 20 mg by mouth daily.     polyethylene glycol (MIRALAX / GLYCOLAX) 17 g packet Take 17 g by mouth 2 (two) times daily.     QUEtiapine (SEROQUEL) 100 MG tablet Take 0.5 tablets (50 mg total) by mouth at bedtime. Take 100 mg in the morning and 200 mg at bedtime (Patient taking differently: Take 100 mg by mouth at bedtime.)     spironolactone (ALDACTONE) 50 MG tablet Take 50 mg by mouth daily.     traMADol (ULTRAM) 50 MG tablet Take 50 mg by mouth every 12 (twelve) hours as needed.     zinc sulfate 220 (50 Zn) MG capsule Take by mouth daily.     No current facility-administered medications for this visit.    Allergies:   Patient has no known allergies.    Social History:  The patient  reports that he has quit smoking. His smoking use included cigarettes. He has never used smokeless tobacco. He reports that he does not currently use alcohol after a past usage of about 3.0 standard drinks of alcohol per week. He reports that he does not currently use drugs.   Family History:  The patient's family history includes CAD in his father; Hypothyroidism in his mother.    ROS:  Please see the history of present illness.   Otherwise, review of systems are positive for none.   All other systems are reviewed and negative.    PHYSICAL EXAM: VS:  BP 90/70 (BP Location: Right Arm, Patient Position: Sitting, Cuff Size: Large)   Pulse (!) 115   Ht 5\' 8"  (1.727 m)   Wt 200 lb (90.7 kg)   SpO2 98%   BMI 30.41 kg/m  , BMI Body mass index is 30.41 kg/m. GEN: Well nourished, well developed, in no acute distress  HEENT: normal  Neck: no JVD, carotid bruits, or masses Cardiac:  Irregularly irregular and tachycardic; no murmurs, rubs, or gallops, mild bilateral leg edema Respiratory:  clear to auscultation bilaterally, normal work of breathing GI: soft, nontender, nondistended, + BS MS: no deformity or atrophy  Skin: warm and dry, no rash Neuro:  Strength and sensation are intact Psych: euthymic mood, full affect   EKG:  EKG is ordered today. The ekg ordered today demonstrates atypical atrial flutter with ventricular rate of 115 bpm.  Right bundle branch block   Recent Labs: 06/23/2021: ALT 21; BUN 8; Creatinine, Ser 0.71; Potassium 4.1; Sodium 133 01/31/2022: Hemoglobin 13.8; Platelets 58    Lipid Panel No results found for: "CHOL", "TRIG", "HDL", "CHOLHDL", "VLDL", "  LDLCALC", "LDLDIRECT"    Wt Readings from Last 3 Encounters:  05/29/22 200 lb (90.7 kg)  01/31/22 225 lb 7.4 oz (102.3 kg)  01/25/22 225 lb (102.1 kg)           No data to display            ASSESSMENT AND PLAN:  1.  Chronic atrial flutter: He remains in atypical atrial flutter with mild tachycardia.  In spite of that, he seems to tolerate this rhythm reasonably well.  Unfortunately, management options are limited due to underlying liver cirrhosis and thrombocytopenia.  We are not able to anticoagulate him and thus not able to proceed with cardioversion safely.  Continue rate control with nadolol and digoxin.  2.  Chronic systolic heart failure with mildly reduced ejection fraction: He appears to be euvolemic on spironolactone 50 mg daily and furosemide 40 mg twice daily.  3.  History of syncope: Possibly due to seizures in the setting of toxic encephalopathy but he did have an episode that was highly suggestive of orthostatic hypotension.  4.  Liver cirrhosis: He quit drinking alcohol more than 18 months ago.    Disposition:   FU in 6 months.  Signed,  Lawrence Bears, MD  05/29/2022 2:20 PM    [facility]

## 2022-05-29 NOTE — Patient Instructions (Signed)
Medication Instructions:  Your physician recommends that you continue on your current medications as directed. Please refer to the Current Medication list given to you today.  *If you need a refill on your cardiac medications before your next appointment, please call your pharmacy*   Lab Work: None ordered   If you have labs (blood work) drawn today and your tests are completely normal, you will receive your results only by: MyChart Message (if you have MyChart) OR A paper copy in the mail If you have any lab test that is abnormal or we need to change your treatment, we will call you to review the results.   Testing/Procedures: None ordered    Follow-Up: At [facility], you and your health needs are our priority.  As part of our continuing mission to provide you with exceptional heart care, we have created designated Provider Care Teams.  These Care Teams include your primary Cardiologist (physician) and Advanced Practice Providers (APPs -  Physician Assistants and Nurse Practitioners) who all work together to provide you with the care you need, when you need it.  We recommend signing up for the patient portal called "MyChart".  Sign up information is provided on this After Visit Summary.  MyChart is used to connect with patients for Virtual Visits (Telemedicine).  Patients are able to view lab/test results, encounter notes, upcoming appointments, etc.  Non-urgent messages can be sent to your provider as well.   To learn more about what you can do with MyChart, go to [email or web address].    Your next appointment:   6 month(s)  The format for your next appointment:   In Person  Provider:   You may see Dr. Lorine Bears or one of the following Advanced Practice Providers on your designated Care Team:   Nicolasa Ducking, NP Eula Listen, PA-C Cadence Fransico Michael, PA-C Charlsie Quest, NP    Other Instructions   Important Information About Sugar

## 2022-07-13 ENCOUNTER — Emergency Department: Payer: Medicare Other

## 2022-07-13 ENCOUNTER — Encounter: Payer: Self-pay | Admitting: Internal Medicine

## 2022-07-13 ENCOUNTER — Other Ambulatory Visit: Payer: Self-pay

## 2022-07-13 ENCOUNTER — Inpatient Hospital Stay
Admission: EM | Admit: 2022-07-13 | Discharge: 2022-07-16 | DRG: 441 | Disposition: A | Discharge status: Home Health | Payer: Medicare Other | Attending: Family Medicine | Admitting: Family Medicine

## 2022-07-13 DIAGNOSIS — E785 Hyperlipidemia, unspecified: Secondary | ICD-10-CM | POA: Diagnosis present

## 2022-07-13 DIAGNOSIS — K766 Portal hypertension: Secondary | ICD-10-CM | POA: Diagnosis present

## 2022-07-13 DIAGNOSIS — E1142 Type 2 diabetes mellitus with diabetic polyneuropathy: Secondary | ICD-10-CM | POA: Diagnosis present

## 2022-07-13 DIAGNOSIS — R4182 Altered mental status, unspecified: Secondary | ICD-10-CM | POA: Diagnosis present

## 2022-07-13 DIAGNOSIS — G9341 Metabolic encephalopathy: Secondary | ICD-10-CM | POA: Diagnosis present

## 2022-07-13 DIAGNOSIS — I2510 Atherosclerotic heart disease of native coronary artery without angina pectoris: Secondary | ICD-10-CM | POA: Diagnosis present

## 2022-07-13 DIAGNOSIS — E039 Hypothyroidism, unspecified: Secondary | ICD-10-CM | POA: Diagnosis present

## 2022-07-13 DIAGNOSIS — R9431 Abnormal electrocardiogram [ECG] [EKG]: Secondary | ICD-10-CM | POA: Diagnosis present

## 2022-07-13 DIAGNOSIS — J449 Chronic obstructive pulmonary disease, unspecified: Secondary | ICD-10-CM | POA: Diagnosis present

## 2022-07-13 DIAGNOSIS — I10 Essential (primary) hypertension: Secondary | ICD-10-CM | POA: Diagnosis present

## 2022-07-13 DIAGNOSIS — R400 Somnolence: Secondary | ICD-10-CM

## 2022-07-13 DIAGNOSIS — R569 Unspecified convulsions: Secondary | ICD-10-CM | POA: Diagnosis not present

## 2022-07-13 DIAGNOSIS — M329 Systemic lupus erythematosus, unspecified: Secondary | ICD-10-CM | POA: Diagnosis present

## 2022-07-13 DIAGNOSIS — K7682 Hepatic encephalopathy: Secondary | ICD-10-CM | POA: Diagnosis present

## 2022-07-13 DIAGNOSIS — I4719 Other supraventricular tachycardia: Secondary | ICD-10-CM | POA: Diagnosis present

## 2022-07-13 DIAGNOSIS — E7220 Disorder of urea cycle metabolism, unspecified: Secondary | ICD-10-CM | POA: Diagnosis present

## 2022-07-13 DIAGNOSIS — Z8673 Personal history of transient ischemic attack (TIA), and cerebral infarction without residual deficits: Secondary | ICD-10-CM

## 2022-07-13 DIAGNOSIS — K7031 Alcoholic cirrhosis of liver with ascites: Secondary | ICD-10-CM | POA: Diagnosis present

## 2022-07-13 DIAGNOSIS — E1165 Type 2 diabetes mellitus with hyperglycemia: Secondary | ICD-10-CM | POA: Diagnosis present

## 2022-07-13 DIAGNOSIS — Z794 Long term (current) use of insulin: Secondary | ICD-10-CM | POA: Diagnosis not present

## 2022-07-13 DIAGNOSIS — I6502 Occlusion and stenosis of left vertebral artery: Secondary | ICD-10-CM | POA: Diagnosis present

## 2022-07-13 DIAGNOSIS — Z87891 Personal history of nicotine dependence: Secondary | ICD-10-CM

## 2022-07-13 DIAGNOSIS — Z79890 Hormone replacement therapy (postmenopausal): Secondary | ICD-10-CM

## 2022-07-13 DIAGNOSIS — I672 Cerebral atherosclerosis: Secondary | ICD-10-CM | POA: Diagnosis present

## 2022-07-13 DIAGNOSIS — Z86718 Personal history of other venous thrombosis and embolism: Secondary | ICD-10-CM

## 2022-07-13 DIAGNOSIS — K219 Gastro-esophageal reflux disease without esophagitis: Secondary | ICD-10-CM | POA: Diagnosis present

## 2022-07-13 DIAGNOSIS — D696 Thrombocytopenia, unspecified: Secondary | ICD-10-CM | POA: Diagnosis present

## 2022-07-13 DIAGNOSIS — L03314 Cellulitis of groin: Secondary | ICD-10-CM | POA: Diagnosis present

## 2022-07-13 DIAGNOSIS — Z79899 Other long term (current) drug therapy: Secondary | ICD-10-CM

## 2022-07-13 DIAGNOSIS — I700 Atherosclerosis of aorta: Secondary | ICD-10-CM | POA: Diagnosis present

## 2022-07-13 DIAGNOSIS — R29810 Facial weakness: Secondary | ICD-10-CM | POA: Diagnosis present

## 2022-07-13 DIAGNOSIS — G40909 Epilepsy, unspecified, not intractable, without status epilepticus: Secondary | ICD-10-CM | POA: Diagnosis present

## 2022-07-13 DIAGNOSIS — Z8249 Family history of ischemic heart disease and other diseases of the circulatory system: Secondary | ICD-10-CM

## 2022-07-13 DIAGNOSIS — I4892 Unspecified atrial flutter: Secondary | ICD-10-CM | POA: Diagnosis present

## 2022-07-13 DIAGNOSIS — I824Y1 Acute embolism and thrombosis of unspecified deep veins of right proximal lower extremity: Secondary | ICD-10-CM | POA: Diagnosis present

## 2022-07-13 DIAGNOSIS — I480 Paroxysmal atrial fibrillation: Secondary | ICD-10-CM | POA: Diagnosis present

## 2022-07-13 HISTORY — DX: Local infection of the skin and subcutaneous tissue, unspecified: L08.9

## 2022-07-13 LAB — URINALYSIS, ROUTINE W REFLEX MICROSCOPIC
Bilirubin Urine: NEGATIVE
Glucose, UA: 150 mg/dL — AB
Hgb urine dipstick: NEGATIVE
Ketones, ur: 5 mg/dL — AB
Leukocytes,Ua: NEGATIVE
Nitrite: NEGATIVE
Protein, ur: NEGATIVE mg/dL
Specific Gravity, Urine: 1.021 (ref 1.005–1.030)
pH: 6 (ref 5.0–8.0)

## 2022-07-13 LAB — CBG MONITORING, ED: Glucose-Capillary: 155 mg/dL — ABNORMAL HIGH (ref 70–99)

## 2022-07-13 LAB — COMPREHENSIVE METABOLIC PANEL
ALT: 22 U/L (ref 0–44)
AST: 50 U/L — ABNORMAL HIGH (ref 15–41)
Albumin: 3.3 g/dL — ABNORMAL LOW (ref 3.5–5.0)
Alkaline Phosphatase: 132 U/L — ABNORMAL HIGH (ref 38–126)
Anion gap: 9 (ref 5–15)
BUN: 15 mg/dL (ref 8–23)
CO2: 25 mmol/L (ref 22–32)
Calcium: 9.2 mg/dL (ref 8.9–10.3)
Chloride: 104 mmol/L (ref 98–111)
Creatinine, Ser: 0.81 mg/dL (ref 0.61–1.24)
GFR, Estimated: >60 mL/min (ref >60)
Glucose, Bld: 181 mg/dL — ABNORMAL HIGH (ref 70–99)
Potassium: 5.2 mmol/L — ABNORMAL HIGH (ref 3.5–5.1)
Sodium: 138 mmol/L (ref 135–145)
Total Bilirubin: 3.4 mg/dL — ABNORMAL HIGH (ref 0.3–1.2)
Total Protein: 7.3 g/dL (ref 6.5–8.1)

## 2022-07-13 LAB — BLOOD GAS, VENOUS
Acid-Base Excess: 5 mmol/L — ABNORMAL HIGH (ref 0.0–2.0)
Bicarbonate: 29.9 mmol/L — ABNORMAL HIGH (ref 20.0–28.0)
O2 Saturation: 62.8 %
Patient temperature: 37
pCO2, Ven: 44 mmHg (ref 44–60)
pH, Ven: 7.44 — ABNORMAL HIGH (ref 7.25–7.43)
pO2, Ven: 41 mmHg (ref 32–45)

## 2022-07-13 LAB — CBC WITH DIFFERENTIAL/PLATELET
Abs Immature Granulocytes: 0.01 10*3/uL (ref 0.00–0.07)
Basophils Absolute: 0 10*3/uL (ref 0.0–0.1)
Basophils Relative: 1 %
Eosinophils Absolute: 0.2 10*3/uL (ref 0.0–0.5)
Eosinophils Relative: 4 %
HCT: 42 % (ref 39.0–52.0)
Hemoglobin: 14.7 g/dL (ref 13.0–17.0)
Immature Granulocytes: 0 %
Lymphocytes Relative: 11 %
Lymphs Abs: 0.5 10*3/uL — ABNORMAL LOW (ref 0.7–4.0)
MCH: 31.5 pg (ref 26.0–34.0)
MCHC: 35 g/dL (ref 30.0–36.0)
MCV: 90.1 fL (ref 80.0–100.0)
Monocytes Absolute: 0.5 10*3/uL (ref 0.1–1.0)
Monocytes Relative: 12 %
Neutro Abs: 2.9 10*3/uL (ref 1.7–7.7)
Neutrophils Relative %: 72 %
Platelets: 50 10*3/uL — ABNORMAL LOW (ref 150–400)
RBC: 4.66 MIL/uL (ref 4.22–5.81)
RDW: 13.7 % (ref 11.5–15.5)
WBC: 4 10*3/uL (ref 4.0–10.5)
nRBC: 0 % (ref 0.0–0.2)

## 2022-07-13 LAB — TROPONIN I (HIGH SENSITIVITY)
Troponin I (High Sensitivity): 10 ng/L (ref <18)
Troponin I (High Sensitivity): 10 ng/L (ref <18)

## 2022-07-13 LAB — URINE DRUG SCREEN, QUALITATIVE (ARMC ONLY)
Amphetamines, Ur Screen: NOT DETECTED
Barbiturates, Ur Screen: NOT DETECTED
Benzodiazepine, Ur Scrn: NOT DETECTED
Cannabinoid 50 Ng, Ur ~~LOC~~: NOT DETECTED
Cocaine Metabolite,Ur ~~LOC~~: NOT DETECTED
MDMA (Ecstasy)Ur Screen: NOT DETECTED
Methadone Scn, Ur: NOT DETECTED
Opiate, Ur Screen: NOT DETECTED
Phencyclidine (PCP) Ur S: NOT DETECTED
Tricyclic, Ur Screen: POSITIVE — AB

## 2022-07-13 LAB — MAGNESIUM: Magnesium: 1.7 mg/dL (ref 1.7–2.4)

## 2022-07-13 LAB — LACTIC ACID, PLASMA
Lactic Acid, Venous: 3 mmol/L (ref 0.5–1.9)
Lactic Acid, Venous: 1.9 mmol/L (ref 0.5–1.9)
Lactic Acid, Venous: 2 mmol/L (ref 0.5–1.9)

## 2022-07-13 LAB — SALICYLATE LEVEL: Salicylate Lvl: <7 mg/dL — ABNORMAL LOW (ref 7.0–30.0)

## 2022-07-13 LAB — AMMONIA: Ammonia: 79 umol/L — ABNORMAL HIGH (ref 9–35)

## 2022-07-13 LAB — CK: Total CK: 73 U/L (ref 49–397)

## 2022-07-13 LAB — DIGOXIN LEVEL: Digoxin Level: 0.8 ng/mL (ref 0.8–2.0)

## 2022-07-13 LAB — ETHANOL: Alcohol, Ethyl (B): <10 mg/dL (ref <10)

## 2022-07-13 LAB — BRAIN NATRIURETIC PEPTIDE: B Natriuretic Peptide: 95.6 pg/mL (ref 0.0–100.0)

## 2022-07-13 LAB — PROTIME-INR
INR: 1.5 — ABNORMAL HIGH (ref 0.8–1.2)
Prothrombin Time: 18.3 seconds — ABNORMAL HIGH (ref 11.4–15.2)

## 2022-07-13 LAB — ACETAMINOPHEN LEVEL: Acetaminophen (Tylenol), Serum: <10 ug/mL — ABNORMAL LOW (ref 10–30)

## 2022-07-13 MED ORDER — LEVETIRACETAM IN NACL 1000 MG/100ML IV SOLN
1000.0000 mg | Freq: Two times a day (BID) | INTRAVENOUS | Status: DC
  Administered 2022-07-14 – 2022-07-16 (×5): 1000 mg via INTRAVENOUS
  Filled 2022-07-13 (×5): qty 100

## 2022-07-13 MED ORDER — LORAZEPAM 2 MG/ML IJ SOLN
0.5000 mg | Freq: Once | INTRAMUSCULAR | Status: AC
  Administered 2022-07-13: 1 mg via INTRAVENOUS

## 2022-07-13 MED ORDER — LORAZEPAM 2 MG/ML IJ SOLN
INTRAMUSCULAR | Status: AC
  Administered 2022-07-13: 2 mg via INTRAVENOUS
  Filled 2022-07-13: qty 1

## 2022-07-13 MED ORDER — NADOLOL 20 MG PO TABS
20.0000 mg | ORAL_TABLET | Freq: Every day | ORAL | Status: DC
  Administered 2022-07-15 – 2022-07-16 (×2): 20 mg via ORAL
  Filled 2022-07-13 (×3): qty 1

## 2022-07-13 MED ORDER — SODIUM CHLORIDE 0.9 % IV BOLUS
1000.0000 mL | Freq: Once | INTRAVENOUS | Status: AC
  Administered 2022-07-13: 1000 mL via INTRAVENOUS

## 2022-07-13 MED ORDER — LEVOTHYROXINE SODIUM 100 MCG PO TABS
100.0000 ug | ORAL_TABLET | Freq: Every day | ORAL | Status: DC
  Administered 2022-07-15 – 2022-07-16 (×2): 100 ug via ORAL
  Filled 2022-07-13: qty 2
  Filled 2022-07-13 (×2): qty 1

## 2022-07-13 MED ORDER — INSULIN ASPART 100 UNIT/ML IJ SOLN
0.0000 [IU] | INTRAMUSCULAR | Status: DC
  Administered 2022-07-14 – 2022-07-15 (×4): 2 [IU] via SUBCUTANEOUS
  Filled 2022-07-13 (×5): qty 1

## 2022-07-13 MED ORDER — EZETIMIBE 10 MG PO TABS
10.0000 mg | ORAL_TABLET | Freq: Every day | ORAL | Status: DC
  Administered 2022-07-15 – 2022-07-16 (×2): 10 mg via ORAL
  Filled 2022-07-13 (×3): qty 1

## 2022-07-13 MED ORDER — LORAZEPAM 2 MG/ML IJ SOLN
INTRAMUSCULAR | Status: AC
  Filled 2022-07-13: qty 1

## 2022-07-13 MED ORDER — SODIUM CHLORIDE 0.9 % IV SOLN
1500.0000 mg | Freq: Once | INTRAVENOUS | Status: AC
  Administered 2022-07-13: 1500 mg via INTRAVENOUS
  Filled 2022-07-13: qty 30

## 2022-07-13 MED ORDER — SODIUM CHLORIDE 0.9 % IV SOLN: Freq: Once | INTRAVENOUS | Status: AC

## 2022-07-13 MED ORDER — SODIUM CHLORIDE 0.9 % IV SOLN: INTRAVENOUS | Status: AC

## 2022-07-13 MED ORDER — LORAZEPAM 2 MG/ML IJ SOLN: 2.0000 mg | Freq: Once | INTRAMUSCULAR | Status: AC

## 2022-07-13 MED ORDER — CHLORHEXIDINE GLUCONATE 0.12% ORAL RINSE (MEDLINE KIT)
15.0000 mL | Freq: Two times a day (BID) | OROMUCOSAL | Status: DC
  Administered 2022-07-16: 15 mL via OROMUCOSAL
  Filled 2022-07-13 (×2): qty 15

## 2022-07-13 MED ORDER — THIAMINE HCL 100 MG/ML IJ SOLN
500.0000 mg | Freq: Every day | INTRAVENOUS | Status: DC
  Administered 2022-07-13 – 2022-07-15 (×3): 500 mg via INTRAVENOUS
  Filled 2022-07-13 (×4): qty 5

## 2022-07-13 MED ORDER — DIGOXIN 250 MCG PO TABS
0.2500 mg | ORAL_TABLET | Freq: Every day | ORAL | Status: DC
  Administered 2022-07-15 – 2022-07-16 (×2): 0.25 mg via ORAL
  Filled 2022-07-13 (×3): qty 1

## 2022-07-13 MED ORDER — MAGNESIUM SULFATE IN D5W 1-5 GM/100ML-% IV SOLN
1.0000 g | Freq: Once | INTRAVENOUS | Status: AC
  Administered 2022-07-13: 1 g via INTRAVENOUS
  Filled 2022-07-13: qty 100

## 2022-07-13 MED ORDER — LACTULOSE ENEMA
300.0000 mL | Freq: Once | ORAL | Status: AC
  Administered 2022-07-14: 300 mL via RECTAL
  Filled 2022-07-13: qty 300

## 2022-07-13 MED ORDER — SODIUM CHLORIDE 0.9% FLUSH
3.0000 mL | Freq: Two times a day (BID) | INTRAVENOUS | Status: DC
  Administered 2022-07-13 – 2022-07-16 (×5): 3 mL via INTRAVENOUS

## 2022-07-13 MED ORDER — PANTOPRAZOLE SODIUM 40 MG IV SOLR
40.0000 mg | Freq: Two times a day (BID) | INTRAVENOUS | Status: DC
  Administered 2022-07-13 – 2022-07-16 (×6): 40 mg via INTRAVENOUS
  Filled 2022-07-13 (×6): qty 10

## 2022-07-13 MED ORDER — IOHEXOL 350 MG/ML SOLN
100.0000 mL | Freq: Once | INTRAVENOUS | Status: AC | PRN
  Administered 2022-07-13: 100 mL via INTRAVENOUS

## 2022-07-13 MED ORDER — ACETAMINOPHEN 650 MG RE SUPP: 650.0000 mg | Freq: Four times a day (QID) | RECTAL | Status: DC | PRN

## 2022-07-13 MED ORDER — SODIUM CHLORIDE 0.9 % IV SOLN
2000.0000 mg | Freq: Once | INTRAVENOUS | Status: AC
  Administered 2022-07-14: 2000 mg via INTRAVENOUS
  Filled 2022-07-13: qty 20

## 2022-07-13 MED ORDER — ORAL CARE MOUTH RINSE
15.0000 mL | OROMUCOSAL | Status: DC
  Administered 2022-07-14 (×3): 15 mL via OROMUCOSAL
  Filled 2022-07-13 (×8): qty 15

## 2022-07-13 MED ORDER — ACETAMINOPHEN 325 MG PO TABS: 650.0000 mg | ORAL_TABLET | Freq: Four times a day (QID) | ORAL | Status: DC | PRN

## 2022-07-13 NOTE — ED Provider Notes (Signed)
Clinical history discussed and consulted with the hospitalist Dr. Girard Cooter.  We will start rectal lactulose, hospitalist to admit for further care and treatment for altered mental status, encephalopathy with ongoing following by Dr. Jacqualin Combes, MD 07/13/22 [phone]

## 2022-07-13 NOTE — ED Notes (Signed)
Pt did move right arm in CT while RN was attempting to obtain larger IV access. Neuro aware and with RN in CT scan.

## 2022-07-13 NOTE — ED Notes (Signed)
Returned from MRI at this time.

## 2022-07-13 NOTE — ED Notes (Signed)
Patient had to be repositioned in bed as he was trying to get out of bed. Bed alarm turned on.

## 2022-07-13 NOTE — Consult Note (Signed)
Neurology Consultation Reason for Consult: c/f Seizure, AMS Requesting Physician: Conni Slipper  CC: Shaking, AMS  History is obtained from: Wife and chart review   HPI: Lawrence Osborn is a 71 y.o. male with a past medical history significant for alcoholic cirrhosis (no longer drinking), possible lupus, diabetes, hypertension, hyperlipidemia, atrial fibrillation not on anticoagulation due to thrombocytopenia, COPD, anemia, seizure disorder versus spells of hepatic encephalopathy  Wife reports that he was in his usual state of health yesterday, doing quite well walking around outside.  He was fine when he went to bed around 8:30 PM.  When he got up to pee this morning he was somewhat confused, and that he did not remember where the bathroom was.  He did come back to the bedroom and laid down.  Subsequently around noon the wife found him on the floor and thinks he must of been trying to get out of bed.  Initially he was shaking but still able to talk (belligerent, cursing which is not atypical during one of his spells).  Subsequently he was sleeping.  He ended up soiling himself (which is not unusual when he is encephalopathic especially as he is taking his lactulose).  Due to his continued altered states she called EMS and he was brought to [facility] for further evaluation, where he was loaded with fosphenytoin and I was called for evaluation   She initially reported that at baseline he walks independently but then reported that he mainly used furniture surface inside the home and has a walker but does not use it.  He does manage his ADLs but she manages IADLs for him such as finances.  He has had multiple admissions for altered mental status recently, with lack of clarity about whether seizure activity may be underlying some of these episodes.  At one point he was tapered off of Keppra but most recently in June 2023 at [facility] EEG was felt to be on the ictal/interictal continuum.  He was discharged on Keppra 1000  mg twice daily at that time, though he was eventually tapered to 500 mg twice daily per wife, though she is unsure which physician made that change.  Result Date: 03/08/2022 Dorina Hoyer, MD 03/08/2022 1:11 PM FINAL LONGTERM VIDEO EEG MONITORING REPORT Identifying Information NAME: Lawrence Osborn MRN: [id number] DOB: 09/17/1896 LOC: 4304/4304-01 HISTORY: 71 y.o. male with history of seizure disorder, who was found unresponsive with questionable seizure like activity (left eye gaze, BUE shaking). Eval for seizures. INDICATION: Seizures Study Information EEG Start: March 07, 2022 at [facility] EEG End: March 08, 2022 at 1031 Duration: 16 hours and 28 minutes EEG TECHNICAL DESCRIPTION Conditions of Recording: Continuous EEG with simultaneous video recording was performed utilizing 21 active electrodes placed according to the international 10-20 system. The study was recorded digitally with a bandpass of 1-70Hz  and a sampling rate of 200Hz  and was reviewed with the possibility of multiple reformatting. The study was digitally processed with potential spike and seizure events identified for physician analysis and review. Patient recognized events were identified by a push button marker and reviewed by the physician. Simultaneous video was reviewed for all patient events. INITIAL EEG DESCRIPTION: 03/07/22 at 1803 to 03/08/22 at 1031 Relevant Medications: Keppra 1000 mg BID, Propofol ggt Background Activity Frequency (maximal in waking): There is diffuse low amplitude 1-3 hz delta slowing. There are frequent triphasic waves. PDR: Absent Asymmetry/ Focal slowing: No Amplitude: Low (< 20 uV) Continuity: Continuous Sleep Activity/ State Change: State change present without N2 sleep  Reactivity: Unclear Periodic/ Rhythmic Patterns/ Epileptiform Activity: Yes. There are abundant generalized periodic discharges with triphasic morphology, at 1-1.5 hz, which increased in frequency (1.5 -2 hz) and prevalence over the  recording. Seizures: No. Patient Events: No. FINAL INTERPRETATION: Abnormal EEG due to: - Diffuse background delta slowing - Abundant generalized periodic discharges with triphasic morphology, increasing over the recording in frequency up to 2.0 hz, which meets criteria for the ictal interictal continuum CLINICAL CORRELATION / SUMMARY The diffuse background slowing is consistent with moderate-severe encephalopathy, which is a non-specific indicator of diffuse cerebral dysfunction as seen in delirium, metabolic derangement, toxicity, or other types of diffuse encephalopathy. Triphasic waves and GPDs with triphasic wave morphology can be seen in encephalopathy and are often seen in the setting of metabolic etiologies. Over the course of the recording, the GPD's reach frequency of 2 Hz, which technically meets criteria for the ictal interictal continuum; however, morphology suggests these discharges are likely indicative of metabolic encephalopathy. This study was read concurrently with Salina April, M.D. who agrees with the above interpretation. Radford Pax, M.D. Epilepsy Fellow Attending Attestation I concurrently reviewed this EEG with the epilepsy fellow Radford Pax, M.D. I reviewed the fellow's note and made changes as needed. I agree with the findings and interpretation. Salina April, MD Attending Epileptologist   LKW: 8:30 PM on 10/26 Thrombolytic given?: No, thrombocytopenia and outside of the Premorbid modified rankin scale:      3 - Moderate disability. Requires some help, but able to walk unassisted.   ROS: Unable to obtain due to altered mental status.   Past Medical History:  Diagnosis Date   Anemia    Cirrhosis of liver (HCC)    COPD (chronic obstructive pulmonary disease) (HCC)    Diabetes mellitus without complication (HCC)    DVT (deep venous thrombosis) (HCC)    mid calf to groin   Dysrhythmia    atrial fibrillation   GI bleed    HLD (hyperlipidemia)    Hypertension     Hypothyroidism    Lupus (systemic lupus erythematosus) (HCC)    Peritonitis (HCC)    Pneumothorax    Past Surgical History:  Procedure Laterality Date   APPENDECTOMY     COLONOSCOPY     DORSAL SLIT N/A 09/20/2020   Procedure: DORSAL SLIT;  Surgeon: Riki Altes, MD;  Location: [facility] ORS;  Service: Urology;  Laterality: N/A;   ESOPHAGOGASTRODUODENOSCOPY (EGD) WITH PROPOFOL N/A 01/31/2022   Procedure: ESOPHAGOGASTRODUODENOSCOPY (EGD) WITH PROPOFOL;  Surgeon: Toledo, Boykin Nearing, MD;  Location: [facility] ENDOSCOPY;  Service: Gastroenterology;  Laterality: N/A;  IDDM   HERNIA REPAIR     Current Outpatient Medications  Medication Instructions   albuterol (PROVENTIL HFA;VENTOLIN HFA) 108 (90 Base) MCG/ACT inhaler 2 puffs, Inhalation, Every 6 hours PRN   B-12 2,500 mcg, Oral, Daily   digoxin (LANOXIN) 0.25 mg, Oral, Daily   ezetimibe (ZETIA) 10 mg, Oral, Daily   ferrous gluconate (FERGON) 324 mg, Oral, Daily with breakfast   furosemide (LASIX) 40 mg, Oral, 2 times daily   insulin glargine (LANTUS) 35 Units, Subcutaneous, 2 times daily   lactulose (CHRONULAC) 20 g, Oral, 2 times daily   levETIRAcetam (KEPPRA XR) 750 mg, Oral, Daily   levothyroxine (SYNTHROID) 100 mcg, Oral, Daily before breakfast   midodrine (PROAMATINE) 10 mg, Oral, 3 times daily with meals   nadolol (CORGARD) 40 mg, Oral, Daily   omega-3 fish oil (MAXEPA) 1,000 mg, Oral, 2 times daily   omeprazole (PRILOSEC) 20 mg, Oral, Daily  polyethylene glycol (MIRALAX / GLYCOLAX) 17 g, Oral, 2 times daily   QUEtiapine (SEROQUEL) 50 mg, Oral, Daily at bedtime, Take 100 mg in the morning and 200 mg at bedtime   spironolactone (ALDACTONE) 50 mg, Oral, Daily   traMADol (ULTRAM) 50 mg, Oral, Every 12 hours PRN   zinc sulfate 220 (50 Zn) MG capsule Oral, Daily     Family History  Problem Relation Age of Onset   Hypothyroidism Mother    CAD Father      Social History:  reports that he has quit smoking. His smoking use included  cigarettes. He has never used smokeless tobacco. He reports that he does not currently use alcohol after a past usage of about 3.0 standard drinks of alcohol per week. He reports that he does not currently use drugs.   Exam: Current vital signs: BP (!) 149/97   Pulse (!) 110   Temp 98.3 F (36.8 C)   Resp 19   Ht 5\' 8"  (1.727 m)   Wt 100.9 kg   SpO2 100%   BMI 33.82 kg/m  Vital signs in last 24 hours: Temp:  [97.2 F (36.2 C)-98.3 F (36.8 C)] 98.3 F (36.8 C) (10/27 1450) Pulse Rate:  [110-114] 110 (10/27 1450) Resp:  [14-21] 19 (10/27 1450) BP: (149-157)/(97-138) 149/97 (10/27 1450) SpO2:  [99 %-100 %] 100 % (10/27 1450) Weight:  [100.9 kg] 100.9 kg (10/27 1407)   Physical Exam  Constitutional: Appears chronically ill Psych: Minimally interactive Eyes: No scleral injection HENT: No oropharyngeal obstruction.  Abrasions on his forehead MSK: no joint deformities.  Cardiovascular: Tachycardic.  Perfusing extremities well.  Regular rhythm on the monitor Respiratory: Effort normal, non-labored breathing GI: Soft.  Moans to touch of the abdomen Skin: Scattered abrasions  Neuro: Mental Status/sensory/motor: Obtunded.  Localizes with the left upper extremity to noxious stimulation but now with the right which is flaccid.  Trace movement of the bilateral lower extremities to noxious stimulation.  Not following any commands.  Minimally responsive to voice. Cranial Nerves: II:  Pupils are equal, round, and reactive to light.  III,IV, VI: Gaze is mostly midline with occasional roving eye movements V: Facial sensation is symmetric to light eyelash brush VII: Facial movement is notable for slight left facial droop on grimace.  VIII: No clear response to voice  NIHSS total 27 Performed at ~3:50 PM    I have reviewed labs in epic and the results pertinent to this consultation are:  Basic Metabolic Panel: Recent Labs  Lab 07/13/22 1405 07/13/22 1552  NA 138  --   K 5.2*   --   CL 104  --   CO2 25  --   GLUCOSE 181*  --   BUN 15  --   CREATININE 0.81  --   CALCIUM 9.2  --   MG  --  1.7    CBC: Recent Labs  Lab 07/13/22 1405  WBC 4.0  NEUTROABS 2.9  HGB 14.7  HCT 42.0  MCV 90.1  PLT 50*    Coagulation Studies: No results for input(s): "LABPROT", "INR" in the last 72 hours.   Lab Results  Component Value Date   CKTOTAL 73 07/13/2022   TROPONINI <0.03 08/29/2018    I have reviewed the images obtained:  Head CT and C-spine personally reviewed, no acute intracranial process 1. No acute intracranial abnormality. Old right parietal infarct. 2. No acute cervical spine fracture or traumatic listhesis.   CTA personally reviewed, no actionable LVO though left  vertebral occlusion appears new from prior  CTA neck: 1. 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 There is some reconstitution of enhancement within this vessel beginning at the V3 segment level. Notably, retrograde flow was described within the cervical left vertebral artery on the prior carotid artery duplex of 05/18/2021. The right vertebral artery is patent within the neck. Nonstenotic atherosclerotic plaque at the origin of this vessel. 2. The common carotid and internal carotid arteries are patent within the neck without hemodynamically significant stenosis. Atherosclerotic plaque, bilaterally. 3.  Aortic Atherosclerosis (ICD10-I70.0).  CTA head: 1. No intracranial large vessel occlusion is identified. 2. Intracranial atherosclerotic disease, as described. Most notably, there is moderate atherosclerotic narrowing of the distal petrous/proximal cavernous left ICA.  MRI brain No acute intracranial process. Redemonstrated mild right hippocampal atrophy, compared to the left, without signal abnormality.  Stat EEG personally reviewed: This study showed generalized periodic discharges ( GPD) with triphasic morphology at 1-2Hz  with improvement in  frequency after IV ativan but no significant change in pattern and clinical status. Given h/o hepatic cirrhosis, this EEG pattern is most likely due to toxic-metabolic causes like hyperammonemia. Additionally, there is severe diffuse encephalopathy. No seizures or definite epileptiform discharges were seen throughout the recording.  If suspicion for ictal-interictal activity remains a concern, a prolonged study can be considered.   Impression: Most likely recurrent toxic/metabolic encephalopathy specifically hepatic encephalopathy in the setting of elevated ammonia, though it is not as severely elevated as previously and wife reports that he has been more adherent with his lactulose compared to prior presentations.  Seizure and postictal state was considered but did not seem to explain his initial examination notable for profound right upper extremity weakness with marked asymmetry compared to the left side which was localizing.  Therefore he was taken for emergent CTA to confirm there was no large vessel occlusion, followed by stat EEG to rule out subclinical status.  As this was negative, MRI brain was obtained to rule out acute intracranial process such as stroke given the unexplained focal findings.  Of note I would expect seizure activity from either of the potential foci (right hippocampal, right parietal), to result in left-sided shaking and left-sided Todd's paralysis, therefore his right-sided weakness on initial examination remains of unclear etiology. I do think it is prudent to re-load with Keppra and resume prior dose that was effective at time of discharge from [facility].   Initial recommendations: -Emergent CTA head and neck -Stat EEG -MRI brain  Additional Recommendations: - Keppra 2000 mg load, then 1000 mg BID - Appreciate management of comorbidities per primary team, including monitoring ability to protect airway - Neurology will continue to follow  Tyrrell [phone] [facility] coverage for [facility] is from 8 AM to 4 AM in-house and 4 PM to 8 PM by telephone/video. 8 PM to 8 AM emergent questions or overnight urgent questions should be addressed to Teleneurology On-call or [facility] neurohospitalist; contact information can be found on AMION

## 2022-07-13 NOTE — Assessment & Plan Note (Signed)
Accu-Cheks every 4 hours sliding scale to prevent hypoglycemia. We will hold home regimen as patient is n.p.o. and altered currently. A1c 8.4.  Day team MD to decide on additional therapy regimen for patient's poorly controlled diabetes mellitus type 2.

## 2022-07-13 NOTE — H&P (Signed)
History and Physical    Chief Complaint: AMS   HISTORY OF PRESENT ILLNESS: Miki Blank is an 71 y.o. male  brought by EMS for AMS and shaking thought to be from seizures.  HPI per chart review and ED MD. Pt was brought for AMS and found down on the floor , BGT 200 by EMS, per report wife saw pt twitching and was worried about seizure.  Neurologist was consulted stat for stroke evaluation.  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 Patient does not meet criteria for TNKase.  Patient was started on Keppra and given Ativan in the emergency room for suspected Possible seizures.  Pt has PMH as below: Past Medical History:  Diagnosis Date   Anemia    Cirrhosis of liver ([place])    COPD (chronic obstructive pulmonary disease) (HCC)    Diabetes mellitus without complication (HCC)    DVT (deep venous thrombosis) (HCC)    mid calf to groin   Dysrhythmia    atrial fibrillation   GI bleed    HLD (hyperlipidemia)    Hypertension    Hypothyroidism    Lupus (systemic lupus erythematosus) ([place])    Peritonitis ([place])    Pneumothorax    Syncope 08/30/2018   Thoracic radiculopathy 07/16/2016   Toe infection    Review of Systems  Unable to perform ROS: Acuity of condition   No Known Allergies  Past Surgical History:  Procedure Laterality Date   APPENDECTOMY     COLONOSCOPY     DORSAL SLIT N/A 09/20/2020   Procedure: DORSAL SLIT;  Surgeon: Abbie Sons, MD;  Location: [facility] ORS;  Service: Urology;  Laterality: N/A;   ESOPHAGOGASTRODUODENOSCOPY (EGD) WITH PROPOFOL N/A 01/31/2022   Procedure: ESOPHAGOGASTRODUODENOSCOPY (EGD) WITH PROPOFOL;  Surgeon: Toledo, Benay Pike, MD;  Location: [facility] ENDOSCOPY;  Service: Gastroenterology;  Laterality: N/A;  IDDM   HERNIA REPAIR     Social History   Socioeconomic History   Marital status: Married    Spouse name: Not on file   Number of children: Not on  file   Years of education: Not on file   Highest education level: Not on file  Occupational History   Not on file  Tobacco Use   Smoking status: Former    Types: Cigarettes   Smokeless tobacco: Never  Vaping Use   Vaping Use: Never used  Substance and Sexual Activity   Alcohol use: Not Currently    Alcohol/week: 3.0 standard drinks of alcohol    Types: 1 Glasses of wine, 2 Cans of beer per week    Comment: 3 beers a day   Drug use: Not Currently   Sexual activity: Yes  Other Topics Concern   Not on file  Social History Narrative   Not on file   Social Determinants of Health   Financial Resource Strain: Not on file  Food Insecurity: Not on file  Transportation Needs: Not on file  Physical Activity: Not on file  Stress: Not on file  Social Connections: Not on file     CURRENT MEDS:  Current Facility-Administered Medications (Endocrine & Metabolic):    insulin aspart (novoLOG) injection 0-6 Units   levothyroxine (SYNTHROID) tablet 100 mcg  Current Outpatient Medications (Endocrine & Metabolic):    insulin glargine (LANTUS) 100 UNIT/ML injection, Inject 35 Units into the skin 2 (two) times daily.   levothyroxine (SYNTHROID, LEVOTHROID) 100 MCG tablet, Take 100 mcg  by mouth daily before breakfast.  Current Facility-Administered Medications (Cardiovascular):    digoxin (LANOXIN) tablet 0.25 mg   ezetimibe (ZETIA) tablet 10 mg   nadolol (CORGARD) tablet 20 mg  Current Outpatient Medications (Cardiovascular):    digoxin (LANOXIN) 0.25 MG tablet, Take 1 tablet (0.25 mg total) by mouth daily.   ezetimibe (ZETIA) 10 MG tablet, Take 1 tablet (10 mg total) by mouth daily.   furosemide (LASIX) 20 MG tablet, Take 2 tablets (40 mg total) by mouth 2 (two) times daily.   midodrine (PROAMATINE) 10 MG tablet, Take 1 tablet (10 mg total) by mouth 3 (three) times daily with meals. (Patient taking differently: Take 10 mg by mouth in the morning and at bedtime.)   nadolol (CORGARD) 40 MG  tablet, Take 40 mg by mouth daily.   spironolactone (ALDACTONE) 50 MG tablet, Take 50 mg by mouth daily.   Current Outpatient Medications (Respiratory):    albuterol (PROVENTIL HFA;VENTOLIN HFA) 108 (90 Base) MCG/ACT inhaler, Inhale 2 puffs into the lungs every 6 (six) hours as needed for wheezing or shortness of breath.  Current Facility-Administered Medications (Analgesics):    acetaminophen (TYLENOL) tablet 650 mg **OR** acetaminophen (TYLENOL) suppository 650 mg  Current Outpatient Medications (Analgesics):    traMADol (ULTRAM) 50 MG tablet, Take 50 mg by mouth every 12 (twelve) hours as needed.   Current Outpatient Medications (Hematological):    Cyanocobalamin (B-12) 2500 MCG TABS, Take 2,500 mcg by mouth daily.   ferrous gluconate (FERGON) 324 MG tablet, Take 324 mg by mouth daily with breakfast.  Current Facility-Administered Medications (Other):    0.9 %  sodium chloride infusion   chlorhexidine gluconate (MEDLINE KIT) (PERIDEX) 0.12 % solution 15 mL   [COMPLETED] levETIRAcetam (KEPPRA) 2,000 mg in sodium chloride 0.9 % 250 mL IVPB **IN FOLLOWED-BY LINKED GROUP WITH** levETIRAcetam (KEPPRA) IVPB 1000 mg/100 mL premix   Oral care mouth rinse   pantoprazole (PROTONIX) injection 40 mg   sodium chloride flush (NS) 0.9 % injection 3 mL   thiamine (VITAMIN B1) 500 mg in normal saline (50 mL) IVPB  Current Outpatient Medications (Other):    docusate sodium (COLACE) 100 MG capsule, Take 100 mg by mouth 2 (two) times daily.   lactulose (CHRONULAC) 10 GM/15ML solution, Take 20 g by mouth 2 (two) times daily.   levETIRAcetam (KEPPRA XR) 500 MG 24 hr tablet, Take 500 mg by mouth daily.   omega-3 fish oil (MAXEPA) 1000 MG CAPS capsule, Take 1,000 mg by mouth 2 (two) times daily.   omeprazole (PRILOSEC) 20 MG capsule, Take 20 mg by mouth daily.   QUEtiapine (SEROQUEL) 100 MG tablet, Take 0.5 tablets (50 mg total) by mouth at bedtime. Take 100 mg in the morning and 200 mg at bedtime  (Patient taking differently: Take 200 mg by mouth at bedtime.)   zinc sulfate 220 (50 Zn) MG capsule, Take by mouth daily.   polyethylene glycol (MIRALAX / GLYCOLAX) 17 g packet, Take 17 g by mouth 2 (two) times daily. (Patient not taking: Reported on 07/13/2022)    ED Course: Pt in Ed Pt is sedated and nonverbal and minimally responsive to sternal rub. Labs shows thrombocytopenia. NL creatinine and ast of 50 and elevated lactic acid.   Vitals:   07/13/22 2059 07/13/22 2130 07/13/22 2330 07/14/22 0329  BP: (!) 141/101 124/74 (!) 150/94 115/67  Pulse: (!) 105 (!) 104 (!) 106 (!) 104  Resp: _0 Temp: 98.2 F (36.8 C) (!) 97.4 F (36.3 C)  97.6 F (36.4 C) (!) 97.2 F (36.2 C)  TempSrc: Oral Bladder Oral Oral  SpO2: 97% 97% 98% 96%  Weight:      Height:       Total I/O In: 378 [IV Piggyback:378] Out: 1440 [Urine:1440] SpO2: 96 % Blood work in ed shows: Normal VBG, potassium 5.2 glucose 181, alk phos 132, albumin 3.3, AST 50, ammonia 79, total bili 3.4. Thrombocytopenia with a platelet count of 50,000. Lactic of 2.0 from 3.0 PT/INR of 18.3 and 1.5 respectively.  Urinalysis shows glucose.  Results for orders placed or performed during the hospital encounter of 07/13/22 (from the past 48 hour(s))  Acetaminophen level     Status: Abnormal   Collection Time: 07/13/22  2:05 PM  Result Value Ref Range   Acetaminophen (Tylenol), Serum <10 (L) 10 - 30 ug/mL    Comment: (NOTE) Therapeutic concentrations vary significantly. A range of 10-30 ug/mL  may be an effective concentration for many patients. However, some  are best treated at concentrations outside of this range. Acetaminophen concentrations >150 ug/mL at 4 hours after ingestion  and >50 ug/mL at 12 hours after ingestion are often associated with  toxic reactions.  Performed at [facility], [address] [place], [place] [address]   Brain natriuretic peptide     Status: None   Collection Time: 07/13/22   2:05 PM  Result Value Ref Range   B Natriuretic Peptide 95.6 0.0 - 100.0 pg/mL    Comment: Performed at [facility], [address] [place], [place] [address]  Comprehensive metabolic panel     Status: Abnormal   Collection Time: 07/13/22  2:05 PM  Result Value Ref Range   Sodium 138 135 - 145 mmol/L   Potassium 5.2 (H) 3.5 - 5.1 mmol/L    Comment: HEMOLYSIS AT THIS LEVEL MAY AFFECT RESULT   Chloride 104 98 - 111 mmol/L   CO2 25 22 - 32 mmol/L   Glucose, Bld 181 (H) 70 - 99 mg/dL    Comment: Glucose reference range applies only to samples taken after fasting for at least 8 hours.   BUN 15 8 - 23 mg/dL   Creatinine, Ser 0.81 0.61 - 1.24 mg/dL   Calcium 9.2 8.9 - 10.3 mg/dL   Total Protein 7.3 6.5 - 8.1 g/dL   Albumin 3.3 (L) 3.5 - 5.0 g/dL   AST 50 (H) 15 - 41 U/L    Comment: HEMOLYSIS AT THIS LEVEL MAY AFFECT RESULT   ALT 22 0 - 44 U/L    Comment: HEMOLYSIS AT THIS LEVEL MAY AFFECT RESULT   Alkaline Phosphatase 132 (H) 38 - 126 U/L   Total Bilirubin 3.4 (H) 0.3 - 1.2 mg/dL    Comment: HEMOLYSIS AT THIS LEVEL MAY AFFECT RESULT   GFR, Estimated >60 >60 mL/min    Comment: (NOTE) Calculated using the CKD-EPI Creatinine Equation (2021)    Anion gap 9 5 - 15    Comment: Performed at [facility], [address] [place], [place] [address]  Ethanol     Status: None   Collection Time: 07/13/22  2:05 PM  Result Value Ref Range   Alcohol, Ethyl (B) <10 <10 mg/dL    Comment: (NOTE) Lowest detectable limit for serum alcohol is 10 mg/dL.  For medical purposes only. Performed at [facility], [address] [place], [place] [address]   Lactic acid, plasma     Status: Abnormal   Collection Time: 07/13/22  2:05 PM  Result Value Ref Range   Lactic Acid, Venous 3.0 (HH) 0.5 - 1.9 mmol/L    Comment: CRITICAL RESULT CALLED TO, READ BACK BY AND VERIFIED WITH ERICA MEDLIN 07/13/22 _0  BY SB Performed at [facility], [address]  [place], [place] [address]   Salicylate level     Status: Abnormal   Collection Time: 07/13/22  2:05 PM  Result Value Ref Range   Salicylate Lvl <5.8 (L) 7.0 - 30.0 mg/dL    Comment: Performed at [facility], [place], [place] [address]  Troponin I (High Sensitivity)     Status: None   Collection Time: 07/13/22  2:05 PM  Result Value Ref Range   Troponin I (High Sensitivity) 10 <18 ng/L    Comment: (NOTE) Elevated high sensitivity troponin I (hsTnI) values and significant  changes across serial measurements may suggest ACS but many other  chronic and acute conditions are known to elevate hsTnI results.  Refer to the "Links" section for chest pain algorithms and additional  guidance. Performed at [facility], [address] [place], [place] [address]   CBC with Differential     Status: Abnormal   Collection Time: 07/13/22  2:05 PM  Result Value Ref Range   WBC 4.0 4.0 - 10.5 K/uL   RBC 4.66 4.22 - 5.81 MIL/uL   Hemoglobin 14.7 13.0 - 17.0 g/dL   HCT 42.0 39.0 - 52.0 %   MCV 90.1 80.0 - 100.0 fL   MCH 31.5 26.0 - 34.0 pg   MCHC 35.0 30.0 - 36.0 g/dL   RDW 13.7 11.5 - 15.5 %   Platelets 50 (L) 150 - 400 K/uL    Comment: Immature Platelet Fraction may be clinically indicated, consider ordering this additional test [id number]    nRBC 0.0 0.0 - 0.2 %   Neutrophils Relative % 72 %   Neutro Abs 2.9 1.7 - 7.7 K/uL   Lymphocytes Relative 11 %   Lymphs Abs 0.5 (L) 0.7 - 4.0 K/uL   Monocytes Relative 12 %   Monocytes Absolute 0.5 0.1 - 1.0 K/uL   Eosinophils Relative 4 %   Eosinophils Absolute 0.2 0.0 - 0.5 K/uL   Basophils Relative 1 %   Basophils Absolute 0.0 0.0 - 0.1 K/uL   WBC Morphology MORPHOLOGY UNREMARKABLE    RBC Morphology MORPHOLOGY UNREMARKABLE    Smear Review PLATELET COUNT CONFIRMED BY SMEAR    Immature Granulocytes 0 %   Abs Immature Granulocytes 0.01 0.00 - 0.07 K/uL    Comment: Performed at [facility], [address] [place], [place] [address]  Urinalysis, Routine w reflex microscopic Urine, Catheterized     Status: Abnormal   Collection Time: 07/13/22  2:05 PM  Result Value Ref Range   Color, Urine YELLOW (A) YELLOW   APPearance CLEAR (A) CLEAR   Specific Gravity, Urine 1.021 1.005 - 1.030   pH 6.0 5.0 - 8.0   Glucose, UA 150 (A) NEGATIVE mg/dL   Hgb urine dipstick NEGATIVE NEGATIVE   Bilirubin Urine NEGATIVE NEGATIVE   Ketones, ur 5 (A) NEGATIVE mg/dL   Protein, ur NEGATIVE NEGATIVE mg/dL   Nitrite NEGATIVE NEGATIVE   Leukocytes,Ua NEGATIVE NEGATIVE    Comment: Performed at [facility], [address], [place],  [address]  Urine Drug Screen, Qualitative     Status: Abnormal   Collection Time: 07/13/22  2:05 PM  Result Value Ref Range   Tricyclic, Ur Screen POSITIVE (A) NONE DETECTED  Amphetamines, Ur Screen NONE DETECTED NONE DETECTED   MDMA (Ecstasy)Ur Screen NONE DETECTED NONE DETECTED   Cocaine Metabolite,Ur [place] NONE DETECTED NONE DETECTED   Opiate, Ur Screen NONE DETECTED NONE DETECTED   Phencyclidine (PCP) Ur S NONE DETECTED NONE DETECTED   Cannabinoid 50 Ng, Ur [place] NONE DETECTED NONE DETECTED   Barbiturates, Ur Screen NONE DETECTED NONE DETECTED   Benzodiazepine, Ur Scrn NONE DETECTED NONE DETECTED   Methadone Scn, Ur NONE DETECTED NONE DETECTED    Comment: (NOTE) Tricyclics + metabolites, urine    Cutoff 1000 ng/mL Amphetamines + metabolites, urine  Cutoff 1000 ng/mL MDMA (Ecstasy), urine              Cutoff 500 ng/mL Cocaine Metabolite, urine          Cutoff 300 ng/mL Opiate + metabolites, urine        Cutoff 300 ng/mL Phencyclidine (PCP), urine         Cutoff 25 ng/mL Cannabinoid, urine                 Cutoff 50 ng/mL Barbiturates + metabolites, urine  Cutoff 200 ng/mL Benzodiazepine, urine              Cutoff 200 ng/mL Methadone, urine                   Cutoff 300 ng/mL  The urine drug screen provides only a preliminary, unconfirmed analytical test result  and should not be used for non-medical purposes. Clinical consideration and professional judgment should be applied to any positive drug screen result due to possible interfering substances. A more specific alternate chemical method must be used in order to obtain a confirmed analytical result. Gas chromatography / mass spectrometry (GC/MS) is the preferred confirm atory method. Performed at [facility], [address] [place], [place] [address]   Blood gas, venous     Status: Abnormal   Collection Time: 07/13/22  2:05 PM  Result Value Ref Range   pH, Ven 7.44 (H) 7.25 - 7.43   pCO2, Ven 44 44 - 60 mmHg   pO2, Ven 41 32 - 45 mmHg   Bicarbonate 29.9 (H) 20.0 - 28.0 mmol/L   Acid-Base Excess 5.0 (H) 0.0 - 2.0 mmol/L   O2 Saturation 62.8 %   Patient temperature 37.0    Collection site VEIN     Comment: Performed at [facility] [address] [place], [place] [address]  Ammonia     Status: Abnormal   Collection Time: 07/13/22  2:08 PM  Result Value Ref Range   Ammonia 79 (H) 9 - 35 umol/L    Comment: Performed at [facility], [address] [place], [place] [address]  Digoxin level     Status: None   Collection Time: 07/13/22  2:58 PM  Result Value Ref Range   Digoxin Level 0.8 0.8 - 2.0 ng/mL    Comment: Performed at [facility], [place], [place] [address]  Troponin I (High Sensitivity)     Status: None   Collection Time: 07/13/22  3:52 PM  Result Value Ref Range   Troponin I (High Sensitivity) 10 <18 ng/L    Comment: (NOTE) Elevated high sensitivity troponin I (hsTnI) values and significant  changes across serial measurements may suggest ACS but many other  chronic and acute conditions are known to elevate hsTnI results.  Refer to the "Links" section for chest pain algorithms and additional  guidance. Performed  at [facility], [address], [place], [place] [address]   Magnesium     Status: None    Collection Time: 07/13/22  3:52 PM  Result Value Ref Range   Magnesium 1.7 1.7 - 2.4 mg/dL    Comment: Performed at [facility], [address] [place], [place] [address]  Lactic acid, plasma     Status: None   Collection Time: 07/13/22  6:18 PM  Result Value Ref Range   Lactic Acid, Venous 1.9 0.5 - 1.9 mmol/L    Comment: Performed at [facility], [address] [place], [place] [address]  CBG monitoring, ED     Status: Abnormal   Collection Time: 07/13/22  6:33 PM  Result Value Ref Range   Glucose-Capillary 155 (H) 70 - 99 mg/dL    Comment: Glucose reference range applies only to samples taken after fasting for at least 8 hours.  Lactic acid, plasma     Status: Abnormal   Collection Time: 07/13/22  9:02 PM  Result Value Ref Range   Lactic Acid, Venous 2.0 (HH) 0.5 - 1.9 mmol/L    Comment: CRITICAL RESULT CALLED TO, READ BACK BY AND VERIFIED WITH GABRIEL LASTRE _0  ON 07/13/22 SKL Performed at [facility], [address] [place], [place] [address]   CK     Status: None   Collection Time: 07/13/22  9:02 PM  Result Value Ref Range   Total CK 73 49 - 397 U/L    Comment: Performed at [facility], [address] [place], [place] [address]  Protime-INR     Status: Abnormal   Collection Time: 07/13/22  9:02 PM  Result Value Ref Range   Prothrombin Time 18.3 (H) 11.4 - 15.2 seconds   INR 1.5 (H) 0.8 - 1.2    Comment: (NOTE) INR goal varies based on device and disease states. Performed at [facility], [address] [place], [place] [address]   Hemoglobin A1c     Status: Abnormal   Collection Time: 07/13/22  9:02 PM  Result Value Ref Range   Hgb A1c MFr Bld 8.4 (H) 4.8 - 5.6 %    Comment: (NOTE) Pre diabetes:          5.7%-6.4%  Diabetes:              >6.4%  Glycemic control for   <7.0% adults with diabetes    Mean Plasma Glucose 194.38 mg/dL    Comment: Performed at [facility] [address]  [place], [place] [address]  CBG monitoring, ED     Status: Abnormal   Collection Time: 07/14/22 12:01 AM  Result Value Ref Range   Glucose-Capillary 125 (H) 70 - 99 mg/dL    Comment: Glucose reference range applies only to samples taken after fasting for at least 8 hours.   Comment 1 Notify RN     In Ed pt received  Meds ordered this encounter  Medications   LORazepam (ATIVAN) injection 0.5 mg   LORazepam (ATIVAN) injection 0.5 mg   0.9 %  sodium chloride infusion   LORazepam (ATIVAN) 2 MG/ML injection    Vennie Homans E: cabinet override   fosPHENYtoin (CEREBYX) 1,500 mg PE in sodium chloride 0.9 % 50 mL IVPB   sodium chloride 0.9 % bolus 1,000 mL   iohexol (OMNIPAQUE) 350 MG/ML injection 100 mL   LORazepam (ATIVAN) injection 2 mg   LORazepam (ATIVAN) 2 MG/ML injection    [place], Doroteo Bradford  L: cabinet override   thiamine (VITAMIN B1) 500 mg in normal saline (50 mL) IVPB   pantoprazole (PROTONIX) injection 40 mg   magnesium sulfate IVPB 1 g 100 mL   lactulose (CHRONULAC) enema 200 gm   digoxin (LANOXIN) tablet 0.25 mg   ezetimibe (ZETIA) tablet 10 mg   levothyroxine (SYNTHROID) tablet 100 mcg   nadolol (CORGARD) tablet 20 mg   sodium chloride flush (NS) 0.9 % injection 3 mL   0.9 %  sodium chloride infusion   OR Linked Order Group    acetaminophen (TYLENOL) tablet 650 mg    acetaminophen (TYLENOL) suppository 650 mg   chlorhexidine gluconate (MEDLINE KIT) (PERIDEX) 0.12 % solution 15 mL   Oral care mouth rinse   insulin aspart (novoLOG) injection 0-6 Units    Order Specific Question:   Correction coverage:    Answer:   Very Sensitive (ESRD/Dialysis)    Order Specific Question:   CBG < 70:    Answer:   Implement Hypoglycemia Standing Orders and refer to Hypoglycemia Standing Orders sidebar report    Order Specific Question:   CBG 70 - 120:    Answer:   0 units    Order Specific Question:   CBG 121 - 150:    Answer:   0 units    Order Specific Question:   CBG 151 - 200:    Answer:    1 unit    Order Specific Question:   CBG 201-250:    Answer:   2 units    Order Specific Question:   CBG 251-300:    Answer:   3 units    Order Specific Question:   CBG 301-350:    Answer:   4 units    Order Specific Question:   CBG 351-400:    Answer:   5 units    Order Specific Question:   CBG > 400    Answer:   Give 6 units and call MD   FOLLOWED BY Linked Order Group    levETIRAcetam (KEPPRA) 2,000 mg in sodium chloride 0.9 % 250 mL IVPB    levETIRAcetam (KEPPRA) IVPB 1000 mg/100 mL premix    Unresulted Labs (From admission, onward)     Start     Ordered   07/14/22 0500  Comprehensive metabolic panel  Tomorrow morning,   STAT        07/13/22 2026   07/14/22 0500  CBC  Tomorrow morning,   STAT        07/13/22 2026   07/13/22 2022  HIV Antibody (routine testing w rflx)  (HIV Antibody (Routine testing w reflex) panel)  Once,   URGENT        07/13/22 2026   07/13/22 1535  Levetiracetam level  Once,   URGENT        07/13/22 1534             Admission Imaging : MR BRAIN WO CONTRAST  Result Date: 07/13/2022 CLINICAL DATA:  Unresponsive, concern for seizure EXAM: MRI HEAD WITHOUT CONTRAST TECHNIQUE: Multiplanar, multiecho pulse sequences of the brain and surrounding structures were obtained without intravenous contrast. COMPARISON:  08/09/2021 FINDINGS: Brain: No restricted diffusion to suggest acute or subacute infarct. No acute hemorrhage, mass, mass effect, or midline shift. No hydrocephalus or extra-axial collection. 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 Confluent and scattered T2 hyperintense signal in the periventricular white matter, likely the sequela of moderate to severe chronic small vessel ischemic disease.  Mildly advanced cerebral volume loss for age. Unchanged mild right hippocampal atrophy compared to the left. The hippocampi are symmetric in signal. No heterotopia or evidence of cortical dysgenesis.  Vascular: Normal arterial flow voids. Skull and upper cervical spine: Normal marrow signal. Sinuses/Orbits: Minimal mucosal thickening in the left maxillary sinus. Otherwise clear paranasal sinuses. No acute finding in the orbits. Other: The mastoids are well aerated. IMPRESSION: No acute intracranial process. Redemonstrated mild right hippocampal atrophy, compared to the left, without signal abnormality. Electronically Signed   By: Merilyn Baba M.D.   On: 07/13/2022 21:45   EEG adult  Result Date: 07/13/2022 Lora Havens, MD     07/13/2022  6:40 PM Patient Name: Isahia Hollerbach MRN: [id number] Epilepsy Attending: Lora Havens Referring Physician/Provider: Lorenza Chick, MD Date: 07/13/2022 Duration: 28.24 mins Patient history: 71yo M with hepatic cirrhosis now with ams and twitching. EEG to evaluate for seizure. Level of alertness: lethargic AEDs during EEG study: Ativan Technical aspects: This EEG study was done with scalp electrodes positioned according to the 10-20 International system of electrode placement. Electrical activity was reviewed with band pass filter of 1-_0 , sensitivity of 7 uV/mm, display speed of 79m/sec with a _1  notched filter applied as appropriate. EEG data were recorded continuously and digitally stored.  Video monitoring was available and reviewed as appropriate. Description: EEG showed continuous generalized 2.5-3.5 Hz delta slowing. Generalized periodic discharges ( GPD) with triphasic morphology at  1-_2  were also noted. IV ativan 227mwas administered after which frequency of GPDs improved to ._3 .  Hyperventilation and photic stimulation were not performed.   ABNORMALITY - Periodic discharges with triphasic morphology, generalized ( GPDs) - Continuous slow, generalized IMPRESSION: This study showed generalized periodic discharges ( GPD) with triphasic morphology at 1-_4  with improvement in frequency after IV ativan but no significant change in pattern and clinical  status. Given h/o hepatic cirrhosis, this EEG pattern is most likely due to toxic-metabolic causes like hyperammonemia. Additionally, there is severe diffuse encephalopathy. No seizures or definite epileptiform discharges were seen throughout the recording.  If suspicion for ictal-interictal activity remains a concern, a prolonged study can be considered. Priyanka O Barbra Sarks CT ANGIO HEAD NECK W WO CM  Result Date: 07/13/2022 CLINICAL DATA:  Provided history: Neuro deficit, acute, stroke suspected. EXAM: CT ANGIOGRAPHY HEAD AND NECK TECHNIQUE: Multidetector CT imaging of the head and neck was performed using the standard protocol during bolus administration of intravenous contrast. Multiplanar CT image reconstructions and MIPs were obtained to evaluate the vascular anatomy. Carotid stenosis measurements (when applicable) are obtained utilizing NASCET criteria, using the distal internal carotid diameter as the denominator. RADIATION DOSE REDUCTION: This exam was performed according to the departmental dose-optimization program which includes automated exposure control, adjustment of the mA and/or kV according to patient size and/or use of iterative reconstruction technique. CONTRAST:  10034mMNIPAQUE IOHEXOL 350 MG/ML SOLN COMPARISON:  Noncontrast head CT performed earlier today 07/13/2022. Cervical spine CT performed earlier today 07/13/2022. Carotid artery duplex 05/18/2021. FINDINGS: CTA NECK FINDINGS Aortic arch: Standard aortic branching. Atherosclerotic plaque within the visualized aortic arch and proximal major branch vessels of the neck. Streak and beam hardening artifact arising from a dense left-sided contrast bolus partially obscures the left subclavian artery. Within this limitation, there is no appreciable hemodynamically significant innominate or proximal subclavian artery stenosis. Right carotid system: CCA and ICA patent within the neck without stenosis. Atherosclerotic plaque about the carotid  bifurcation and within the proximal ICA. Left carotid system:  CCA and ICA patent within the neck. Nonstenotic atherosclerotic plaque at the CCA origin. Atherosclerotic plaque about the left carotid bifurcation and within the proximal left ICA, resulting in less than 50% stenosis of the proximal ICA. Vertebral arteries: The right vertebral artery is patent within the neck. Nonstenotic atherosclerotic plaque at the origin of this vessel. 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 There is some reconstitution of enhancement within this vessel beginning at the V3 segment. Skeleton: Cervical spondylosis. No acute fracture or aggressive osseous lesion. Other neck: No neck mass or cervical lymphadenopathy. Upper chest: No consolidation within the imaged lung apices. Minimal paraseptal emphysema. Review of the MIP images confirms the above findings CTA HEAD FINDINGS Anterior circulation: The intracranial internal carotid arteries are patent. Atherosclerotic plaque within both vessels. Moderate stenosis within the distal petrous/proximal cavernous left ICA. No more than mild stenosis elsewhere within the intracranial internal carotid arteries. The M1 middle cerebral arteries are patent. Atherosclerotic irregularity of the M2 and more distal MCA vessels, bilaterally. No M2 proximal branch occlusion or high-grade proximal stenosis is identified. The anterior cerebral arteries are patent. No intracranial aneurysm is identified. Posterior circulation: The intracranial vertebral arteries are patent. The basilar artery is patent. The posterior cerebral arteries are patent. Atherosclerotic irregularity of both vessels without high-grade proximal stenosis. Hypoplastic P1 segments with sizable posterior communicating arteries, bilaterally. Venous sinuses: Within the limitations of contrast timing, no convincing thrombus. Anatomic variants: None significant. Review of the MIP images confirms the above  findings Exam findings discussed with Dr. Curly Shores by telephone at 4:50 p.m. on 07/13/2022. IMPRESSION: CTA neck: 1. 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 There is some reconstitution of enhancement within this vessel beginning at the V3 segment level. Notably, retrograde flow was described within the cervical left vertebral artery on the prior carotid artery duplex of 05/18/2021. The right vertebral artery is patent within the neck. Nonstenotic atherosclerotic plaque at the origin of this vessel. 2. The common carotid and internal carotid arteries are patent within the neck without hemodynamically significant stenosis. Atherosclerotic plaque, bilaterally. 3.  Aortic Atherosclerosis (ICD10-I70.0). CTA head: 1. No intracranial large vessel occlusion is identified. 2. Intracranial atherosclerotic disease, as described. Most notably, there is moderate atherosclerotic narrowing of the distal petrous/proximal cavernous left ICA. Electronically Signed   By: Kellie Simmering D.O.   On: 07/13/2022 17:22   CT Head Wo Contrast  Result Date: 07/13/2022 CLINICAL DATA:  Altered mental status.  Possible fall. EXAM: CT HEAD WITHOUT CONTRAST CT CERVICAL SPINE WITHOUT CONTRAST TECHNIQUE: Multidetector CT imaging of the head and cervical spine was performed following the standard protocol without intravenous contrast. Multiplanar CT image reconstructions of the cervical spine were also generated. RADIATION DOSE REDUCTION: This exam was performed according to the departmental dose-optimization program which includes automated exposure control, adjustment of the mA and/or kV according to patient size and/or use of iterative reconstruction technique. COMPARISON:  MRI brain dated August 09, 2021. CT head and cervical spine dated May 18, 2021. FINDINGS: CT HEAD FINDINGS Brain: No evidence of acute infarction, hemorrhage, hydrocephalus, extra-axial collection or mass lesion/mass effect. 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 Stable mild atrophy and chronic microvascular ischemic changes. Vascular: Atherosclerotic vascular calcification of the carotid siphons. No hyperdense vessel. Skull: Normal. Negative for fracture or focal lesion. Sinuses/Orbits: No acute finding. Other: None. CT CERVICAL SPINE FINDINGS Alignment: No traumatic malalignment. Chronic straightening of the normal cervical lordosis. Skull base and vertebrae: No  acute fracture. No primary bone lesion or focal pathologic process. Soft tissues and spinal canal: No prevertebral fluid or swelling. No visible canal hematoma. Disc levels: Degenerative interbody ankylosis at C5-C6 again noted. Unchanged moderate disc height loss at C3-C4. Similar moderate left neuroforaminal stenosis at C3-C4 and C5-C6 due to uncovertebral hypertrophy. Upper chest: Negative. Other: None. IMPRESSION: 1. No acute intracranial abnormality. Old right parietal infarct. 2. No acute cervical spine fracture or traumatic listhesis. Electronically Signed   By: Titus Dubin M.D.   On: 07/13/2022 15:01   CT Cervical Spine Wo Contrast  Result Date: 07/13/2022 CLINICAL DATA:  Altered mental status.  Possible fall. EXAM: CT HEAD WITHOUT CONTRAST CT CERVICAL SPINE WITHOUT CONTRAST TECHNIQUE: Multidetector CT imaging of the head and cervical spine was performed following the standard protocol without intravenous contrast. Multiplanar CT image reconstructions of the cervical spine were also generated. RADIATION DOSE REDUCTION: This exam was performed according to the departmental dose-optimization program which includes automated exposure control, adjustment of the mA and/or kV according to patient size and/or use of iterative reconstruction technique. COMPARISON:  MRI brain dated August 09, 2021. CT head and cervical spine dated May 18, 2021. FINDINGS: CT HEAD FINDINGS Brain: No evidence of acute infarction, hemorrhage, hydrocephalus, extra-axial collection or mass  lesion/mass effect. 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 Stable mild atrophy and chronic microvascular ischemic changes. Vascular: Atherosclerotic vascular calcification of the carotid siphons. No hyperdense vessel. Skull: Normal. Negative for fracture or focal lesion. Sinuses/Orbits: No acute finding. Other: None. CT CERVICAL SPINE FINDINGS Alignment: No traumatic malalignment. Chronic straightening of the normal cervical lordosis. Skull base and vertebrae: No acute fracture. No primary bone lesion or focal pathologic process. Soft tissues and spinal canal: No prevertebral fluid or swelling. No visible canal hematoma. Disc levels: Degenerative interbody ankylosis at C5-C6 again noted. Unchanged moderate disc height loss at C3-C4. Similar moderate left neuroforaminal stenosis at C3-C4 and C5-C6 due to uncovertebral hypertrophy. Upper chest: Negative. Other: None. IMPRESSION: 1. No acute intracranial abnormality. Old right parietal infarct. 2. No acute cervical spine fracture or traumatic listhesis. Electronically Signed   By: Titus Dubin M.D.   On: 07/13/2022 15:01   DG Chest Portable 1 View  Result Date: 07/13/2022 CLINICAL DATA:  Altered mental status, shaking EXAM: PORTABLE CHEST 1 VIEW COMPARISON:  Portable exam 1419 hours without priors for comparison FINDINGS: Enlargement of cardiac silhouette. Mediastinal contours and pulmonary vascularity normal. Lungs clear. No acute infiltrate, pleural effusion, or pneumothorax. Atherosclerotic calcification aorta. Scattered endplate spur formation thoracic spine. IMPRESSION: Enlargement of cardiac silhouette without acute infiltrate. Aortic Atherosclerosis (ICD10-I70.0). Electronically Signed   By: Lavonia Dana M.D.   On: 07/13/2022 14:50      Physical Examination: Vitals:   07/13/22 2059 07/13/22 2130 07/13/22 2330 07/14/22 0329  BP: (!) 141/101 124/74 (!) 150/94 115/67  Pulse: (!) 105 (!) 104 (!) 106 (!) 104  Temp: 98.2 F (36.8 C) (!) 97.4 F (36.3  C) 97.6 F (36.4 C) (!) 97.2 F (36.2 C)  Resp: _0 Height:      Weight:      SpO2: 97% 97% 98% 96%  TempSrc: Oral Bladder Oral Oral  BMI (Calculated):       Physical Exam Vitals and nursing note reviewed.  Constitutional:      General: He is not in acute distress. HENT:     Head: Normocephalic.  Eyes:     Pupils: Pupils are equal, round, and reactive to light.  Cardiovascular:  Rate and Rhythm: Normal rate and regular rhythm.     Pulses: Normal pulses.     Heart sounds: Normal heart sounds.  Pulmonary:     Effort: Pulmonary effort is normal.     Breath sounds: Normal breath sounds.  Abdominal:     General: Bowel sounds are normal. There is no distension.     Palpations: Abdomen is soft. There is no shifting dullness, hepatomegaly or mass.  Musculoskeletal:     Right lower leg: No edema.     Left lower leg: No edema.  Skin:    General: Skin is warm.  Neurological:     Mental Status: He is unresponsive.     GCS: GCS eye subscore is 1. GCS verbal subscore is 2. GCS motor subscore is 4.     Deep Tendon Reflexes:     Reflex Scores:      Bicep reflexes are 1+ on the right side and 1+ on the left side.      Patellar reflexes are 1+ on the right side and 1+ on the left side.     Assessment and Plan: * AMS (altered mental status) Most likely differential in this patient as hepatic encephalopathy less likely seizures. Neurology consulted patient's status post imaging angio evaluation. Patient also status post EGD which was negative for seizure-like activities. We will admit patient to progressive unit with neurochecks. N.p.o., oxygen, telemetry monitoring. Seizure precautions aspiration precautions fall precautions CIWA precautions. CTA head and neck showed : CTA neck:  1. 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 There is some reconstitution of enhancement within this vessel beginning at the V3 segment level. Notably,  retrograde flow was described within the cervical left vertebral artery on the prior carotid artery duplex of 05/18/2021. The right vertebral artery is patent within the neck. Nonstenotic atherosclerotic plaque at the origin of this vessel. 2. The common carotid and internal carotid arteries are patent within the neck without hemodynamically significant stenosis. Atherosclerotic plaque, bilaterally. 3.  Aortic Atherosclerosis (ICD10-I70.0). CTA head: 1. No intracranial large vessel occlusion is identified. 2. Intracranial atherosclerotic disease, as described. Most notably, there is moderate atherosclerotic narrowing of the distal petrous/proximal cavernous left ICA. Neuro checks.  Supplemental oxygen to prevent any hypoxia or dysrhythmia.  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   Seizure disorder ([place]) Seizure precautions. Pt s/p EEG and neurology consulted. Cont keppra.  Avoid meds that lower seizure threshold, I.e tramadol / Levaquin etc.   Acute hepatic encephalopathy (HCC) Elevated ammonia level  And Patient given a single lactulose enema in the emergency room. Will defer to a.m. team to start on a p.o. regimen once patient's mentation is cleared patient is able to swallow.  Last USG of abd  was in 05/2021 which showed ascites.    PAF (paroxysmal atrial fibrillation) (HCC) EKG today shows sinus tachycardia with 13, QTc prolonged at 604. Significant Q waves in leads II, III, aVF, V3 to V6.         We will continue patient on digoxin, nadolol starting from tomorrow.    Prolonged QT interval Pt has prolonged qtc over 600. Keep mag above 2.0 We will hold any qt prolonging meds.  Continuous telemetry.    CAD (coronary artery disease) Resume bb, statin therapy per home regimen once med rec is available.  Antiplatelet therapy held due to thrombocytopenia and elevated INR.  Pt's  last stress test was in 06/2021 showing:    Normal pharmacologic myocardial  perfusion stress test without significant ischemia or scar.   The left ventricular ejection fraction is normal (55-65%).   Aortic atherosclerosis and coronary artery calcification are noted on the attenuation correction CT.  There is also a small left pleural effusion.  Findings compatible with the patient's history of cirrhosis are noted.   EKG demonstrates atypical atrial flutter versus atrial tachycardia with 2:1 conduction (vetnricular rate ~120 bpm).   This is a low risk study.    COPD (chronic obstructive pulmonary disease) (HCC) Stable.  No wheezing on exam.  o2 to keep sats about 93%.  Hypothyroidism Continue with 100 mcg of levothyroxine per home regimen starting tomorrow morning.  Thrombocytopenia (HCC)    Latest Ref Rng & Units 07/13/2022    2:05 PM 01/31/2022   12:08 PM 06/23/2021    8:25 PM  CBC  WBC 4.0 - 10.5 K/uL 4.0  3.0  3.0   Hemoglobin 13.0 - 17.0 g/dL 14.7  13.8  15.5   Hematocrit 39.0 - 52.0 % 42.0  39.0  44.0   Platelets 150 - 400 K/uL 50  58  63   Avoid anticoagulation and antiplatelet therapies secondary to thrombocytopenia which I suspect is from portal hypertension and cirrhosis. We will continue patient on nadolol starting tomorrow morning.   Gastroesophageal reflux disease without esophagitis Start patient on proton pump inhibitor therapy continue patient on PPI therapy.   Type 2 diabetes mellitus with peripheral neuropathy (HCC) Accu-Cheks every 4 hours sliding scale to prevent hypoglycemia. We will hold home regimen as patient is n.p.o. and altered currently. A1c 8.4.  Day team MD to decide on additional therapy regimen for patient's poorly controlled diabetes mellitus type 2.   Acute deep vein thrombosis (DVT) of proximal vein of right lower extremity (HCC) H/O DVT. We will continue scd's.     DVT prophylaxis:  SCDs  Code Status:  Full code  Family CommunicationEzequiel Ganser (Spouse)  [phone]    Disposition Plan:   Home  Consults called:  Neurology Dr. Curly Shores  Admission status: Inpatient  Unit/ Expected LOS: Progressive, 3 to 4 days.   Para Skeans MD [facility]  6 PM- 2 AM. Please contact me via secure Chat 6 PM-2 AM. [phone] ( Pager ) To contact the [facility] Attending or Consulting provider [place] or covering provider during after hours [place], for this patient.   Check the care team in [facility] and look for a) attending/consulting [facility] provider listed and b) the [facility] team listed Log into [email or web address] and use [place]'s universal password to access. If you do not have the password, please contact the hospital operator. Locate the [facility] provider you are looking for under [facility] and page to a number that you can be directly reached. If you still have difficulty reaching the provider, please page the [facility] (Director on Call) for the Hospitalists listed on amion for assistance. [email or web address] 07/14/2022, 5:53 AM

## 2022-07-13 NOTE — Procedures (Signed)
Patient Name: Lawrence Osborn  MRN: [id number]  Epilepsy Attending: Lora Havens  Referring Physician/Provider: Lorenza Chick, MD  Date: 07/13/2022 Duration: 28.24 mins  Patient history: 71yo M with hepatic cirrhosis now with ams and twitching. EEG to evaluate for seizure.   Level of alertness: lethargic   AEDs during EEG study: Ativan  Technical aspects: This EEG study was done with scalp electrodes positioned according to the 10-20 International system of electrode placement. Electrical activity was reviewed with band pass filter of 1-70Hz , sensitivity of 7 uV/mm, display speed of 19mm/sec with a 60Hz  notched filter applied as appropriate. EEG data were recorded continuously and digitally stored.  Video monitoring was available and reviewed as appropriate.  Description: EEG showed continuous generalized 2.5-3.5 Hz delta slowing. Generalized periodic discharges ( GPD) with triphasic morphology at  1-2Hz  were also noted. IV ativan 2mg  was administered after which frequency of GPDs improved to .1Hz .  Hyperventilation and photic stimulation were not performed.     ABNORMALITY - Periodic discharges with triphasic morphology, generalized ( GPDs) - Continuous slow, generalized  IMPRESSION: This study showed generalized periodic discharges ( GPD) with triphasic morphology at 1-2Hz  with improvement in frequency after IV ativan but no significant change in pattern and clinical status. Given h/o hepatic cirrhosis, this EEG pattern is most likely due to toxic-metabolic causes like hyperammonemia. Additionally, there is severe diffuse encephalopathy. No seizures or definite epileptiform discharges were seen throughout the recording.   If suspicion for ictal-interictal activity remains a concern, a prolonged study can be considered.    Ashleigh Arya Barbra Sarks

## 2022-07-13 NOTE — Assessment & Plan Note (Addendum)
EKG today shows sinus tachycardia with 13, QTc prolonged at 604. Significant Q waves in leads II, III, aVF, V3 to V6.         We will continue patient on digoxin, nadolol starting from tomorrow.

## 2022-07-13 NOTE — Assessment & Plan Note (Signed)
Stable.  No wheezing on exam.  o2 to keep sats about 93%.

## 2022-07-13 NOTE — ED Notes (Signed)
Patient being taken to MRI at this time.

## 2022-07-13 NOTE — ED Notes (Signed)
Per Neuro MD, 2MG  IVP ativan given during EEG. Tech at bedside.

## 2022-07-13 NOTE — ED Notes (Signed)
Seizure precautions in place, pt will open eyes to verbal stimuli.

## 2022-07-13 NOTE — ED Provider Notes (Signed)
[facility] Provider Note    Event Date/Time   First MD Initiated Contact with Patient 07/13/22 1400     (approximate)   History   Altered Mental Status (Found down by wife on floor. LKW [phone] by spouse)   HPI  Lawrence Osborn is a 71 y.o. male who was found down the floor.  He has a history of hepatic encephalopathy hepatic cirrhosis history of ascites diabetes type 2 insulin use.  EMS reports his fingerstick glucose was over 200.  He takes Keppra 1000 mg twice a day.  He also takes dig.  EMS reports he was not responding to them but he would get up and walk to the stretcher but tried to sit down repeatedly.  EMS reports wife thought he may have had a seizure but cannot say if he has seizures.  She apparently saw him twitching EMS reported he was twitching as well please see physical exam      Physical Exam   Triage Vital Signs: ED Triage Vitals  Enc Vitals Group     BP --      Pulse Rate 07/13/22 1401 (!) 114     Resp 07/13/22 1401 20     Temp --      Temp src --      SpO2 07/13/22 1401 99 %     Weight 07/13/22 1407 222 lb 7.1 oz (100.9 kg)     Height 07/13/22 1407 5\' 8"  (1.727 m)     Head Circumference --      Peak Flow --      Pain Score 07/13/22 1402 0     Pain Loc --      Pain Edu? --      Excl. in [place]? --     Most recent vital signs: Vitals:   07/13/22 1445 07/13/22 1450  BP:  (!) 149/97  Pulse: (!) 110 (!) 110  Resp: 16 19  Temp: 98.3 F (36.8 C) 98.3 F (36.8 C)  SpO2: 100% 100%     General: Patient staring ahead.  He will blink his eyes occasionally he response to threat.  He occasionally will open and close his mouth.  His eyelids seem to be twitching and sometimes his left arm is twitching as well.  He has no obvious gross focal motor movements. Head normocephalic atraumatic but he has what appears to be rug burns on his forehead. CV:  Good peripheral perfusion.  Heart regular rate and rhythm no audible murmurs Resp:  Normal  effort.  Lungs are clear Abd:  No distention.  Soft and nontender Extremities: Patient has dried stool on his arms and hands and other parts of his body.  It looks like its been there for at least a few hours.   ED Results / Procedures / Treatments   Labs (all labs ordered are listed, but only abnormal results are displayed) Labs Reviewed  ACETAMINOPHEN LEVEL - Abnormal; Notable for the following components:      Result Value   Acetaminophen (Tylenol), Serum <10 (*)    All other components within normal limits  LACTIC ACID, PLASMA - Abnormal; Notable for the following components:   Lactic Acid, Venous 3.0 (*)    All other components within normal limits  SALICYLATE LEVEL - Abnormal; Notable for the following components:   Salicylate Lvl <7.3 (*)    All other components within normal limits  CBC WITH DIFFERENTIAL/PLATELET - Abnormal; Notable for the following components:   Platelets 50 (*)  Lymphs Abs 0.5 (*)    All other components within normal limits  URINALYSIS, ROUTINE W REFLEX MICROSCOPIC - Abnormal; Notable for the following components:   Color, Urine YELLOW (*)    APPearance CLEAR (*)    Glucose, UA 150 (*)    Ketones, ur 5 (*)    All other components within normal limits  URINE DRUG SCREEN, QUALITATIVE ([facility] ONLY) - Abnormal; Notable for the following components:   Tricyclic, Ur Screen POSITIVE (*)    All other components within normal limits  BLOOD GAS, VENOUS - Abnormal; Notable for the following components:   pH, Ven 7.44 (*)    Bicarbonate 29.9 (*)    Acid-Base Excess 5.0 (*)    All other components within normal limits  AMMONIA - Abnormal; Notable for the following components:   Ammonia 79 (*)    All other components within normal limits  BRAIN NATRIURETIC PEPTIDE  ETHANOL  DIGOXIN LEVEL  COMPREHENSIVE METABOLIC PANEL  LEVETIRACETAM LEVEL  TROPONIN I (HIGH SENSITIVITY)  TROPONIN I (HIGH SENSITIVITY)     EKG  EKG read and interpreted by me looks very  similar to EKG from 05/29/2022.  That was read as a flutter.  This is likely the same.  Heart rate is 113 difficult to tell but probably left axis.  Patient has a nonspecific intraventricular conduction delay but this may actually be left bundle.  It looks similar but wider to his EKG from last month QTc is also prolonged and it is longer than last month.   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  I interpreted and reviewed both of the CT  PROCEDURES:  Critical Care performed: Critical care time 20 minutes.  This includes speaking to neurology signing out the patient to the oncoming physician seeing the patient watching him as he got his Ativan and of course reviewing his old records and his current studies.  Procedures   MEDICATIONS ORDERED IN ED: Medications  LORazepam (ATIVAN) 2 MG/ML injection (  Not Given 07/13/22 1414)  sodium chloride 0.9 % bolus 1,000 mL (has no administration in time range)  LORazepam (ATIVAN) injection 0.5 mg (1 mg Intravenous Given 07/13/22 1405)  LORazepam (ATIVAN) injection 0.5 mg (1 mg Intravenous Given 07/13/22 1402)  0.9 %  sodium chloride infusion ( Intravenous New Bag/Given 07/13/22 1415)  fosPHENYtoin (CEREBYX) 1,500 mg PE in sodium chloride 0.9 % 50 mL IVPB (0 mg PE Intravenous Stopped 07/13/22 1506)     IMPRESSION / MDM / ASSESSMENT AND PLAN / ED COURSE  I reviewed the triage vital signs and the nursing notes. ----------------------------------------- 2:19 PM on 07/13/2022 ----------------------------------------- Patient was twitching.  We gave him 2 doses of Ativan he is now stopped twitching.  His eyes are closed.  Blood pressure heart rate O2 sats and respiratory rate remained unchanged.  The computers showing a caution warning about giving IV Keppra with his prolonged QTc.  I will give him fosphenytoin  instead.  Differential diagnosis includes, but is not limited to, hepatic encephalopathy due to ammonia, seizures, stroke, sepsis although this seems unlikely.  Patient's presentation is most consistent with acute presentation with potential threat to life or bodily function.  The patient is on the cardiac monitor to evaluate for evidence of arrhythmia and/or significant heart rate changes.  No arrhythmias have been seen.  I have signed the  patient out to the oncoming physician pending results of the rest of his lab work.  Neurology is going to come and see the patient briefly we will see if we can get an EEG on him.    FINAL CLINICAL IMPRESSION(S) / ED DIAGNOSES   Final diagnoses:  Altered mental status, unspecified altered mental status type  Hepatic encephalopathy (HCC)     Rx / DC Orders   ED Discharge Orders     None        Note:  This document was prepared using Dragon voice recognition software and may include unintentional dictation errors.   Arnaldo Natal, MD 07/13/22 1539

## 2022-07-13 NOTE — Assessment & Plan Note (Signed)
Start patient on proton pump inhibitor therapy continue patient on PPI therapy.

## 2022-07-13 NOTE — Assessment & Plan Note (Signed)
Continue with 100 mcg of levothyroxine per home regimen starting tomorrow morning.

## 2022-07-13 NOTE — Progress Notes (Signed)
Eeg done 

## 2022-07-13 NOTE — Assessment & Plan Note (Signed)
Pt has prolonged qtc over 600. Keep mag above 2.0 We will hold any qt prolonging meds.  Continuous telemetry.

## 2022-07-13 NOTE — ED Provider Notes (Signed)
Discussed case, exam, and EEG with Dr. Curly Shores.  Neurology recommended admission here with ongoing neurology follow-up, but at this point does not appear to be clear evidence of seizure-like activity and is suspected that underlying encephalopathy possibly hepatic is likely the driving factor of today's presentation.   Delman Kitten, MD 07/13/22 [phone]

## 2022-07-13 NOTE — Assessment & Plan Note (Signed)
Resume bb, statin therapy per home regimen once med rec is available.  Antiplatelet therapy held due to thrombocytopenia and elevated INR.  Pt's  last stress test was in 06/2021 showing:  .  Normal pharmacologic myocardial perfusion stress test without significant ischemia or scar. .  The left ventricular ejection fraction is normal (55-65%). Marland Kitchen  Aortic atherosclerosis and coronary artery calcification are noted on the attenuation correction CT.  There is also a small left pleural effusion.  Findings compatible with the patient's history of cirrhosis are noted. .  EKG demonstrates atypical atrial flutter versus atrial tachycardia with 2:1 conduction (vetnricular rate ~120 bpm). .  This is a low risk study.

## 2022-07-13 NOTE — Assessment & Plan Note (Signed)
Patient given a single lactulose enema in the emergency room. Will defer to a.m. team to start on a p.o. regimen once patient's mentation is cleared patient is able to swallow.

## 2022-07-13 NOTE — ED Provider Notes (Signed)
    Dr. Curly Shores from neurology consulting with patient at this time (1600)  Trop normal Magnesium 1.7  Neurology advised patient does not have evidence of stroke such as LVO by imaging.  At this point neurology obtaining a stat EEG in case patient is having any epileptiform type activity. Dr. Curly Shores in ED evalating    Delman Kitten, MD 07/13/22 [phone]

## 2022-07-13 NOTE — Assessment & Plan Note (Signed)
    Latest Ref Rng & Units 07/13/2022    2:05 PM 01/31/2022   12:08 PM 06/23/2021    8:25 PM  CBC  WBC 4.0 - 10.5 K/uL 4.0  3.0  3.0   Hemoglobin 13.0 - 17.0 g/dL 14.7  13.8  15.5   Hematocrit 39.0 - 52.0 % 42.0  39.0  44.0   Platelets 150 - 400 K/uL 50  58  63   Avoid anticoagulation and antiplatelet therapies secondary to thrombocytopenia which I suspect is from portal hypertension and cirrhosis. We will continue patient on nadolol starting tomorrow morning.

## 2022-07-13 NOTE — Assessment & Plan Note (Signed)
Most likely differential in this patient as hepatic encephalopathy less likely seizures. Neurology consulted patient's status post imaging angio evaluation. Patient also status post EGD which was negative for seizure-like activities. We will admit patient to progressive unit with neurochecks. N.p.o., oxygen, telemetry monitoring. Seizure precautions aspiration precautions fall precautions CIWA precautions. CTA head and neck showed : CTA neck: 1. 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 There is some reconstitution of enhancement within this vessel beginning at the V3 segment level. Notably, retrograde flow was described within the cervical left vertebral artery on the prior carotid artery duplex of 05/18/2021. The right vertebral artery is patent within the neck. Nonstenotic atherosclerotic plaque at the origin of this vessel. 2. The common carotid and internal carotid arteries are patent within the neck without hemodynamically significant stenosis. Atherosclerotic plaque, bilaterally. 3.  Aortic Atherosclerosis (ICD10-I70.0). CTA head: 1. No intracranial large vessel occlusion is identified. 2. Intracranial atherosclerotic disease, as described. Most notably, there is moderate atherosclerotic narrowing of the distal petrous/proximal cavernous left ICA. Neuro checks.  Supplemental oxygen to prevent any hypoxia or dysrhythmia.  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

## 2022-07-13 NOTE — ED Triage Notes (Signed)
Pt brought in by EMS for unresponsiveness, wife states found pt shaking and head first down. Pt non-verbal upon arrival. Per EMS pt was able to ambulate but did not speak to them. Abrasion to head, wife reports to EMS being tested for seizures.

## 2022-07-14 ENCOUNTER — Encounter: Payer: Self-pay | Admitting: Internal Medicine

## 2022-07-14 ENCOUNTER — Other Ambulatory Visit: Payer: Self-pay

## 2022-07-14 DIAGNOSIS — R569 Unspecified convulsions: Secondary | ICD-10-CM

## 2022-07-14 DIAGNOSIS — K7682 Hepatic encephalopathy: Secondary | ICD-10-CM

## 2022-07-14 DIAGNOSIS — Z8673 Personal history of transient ischemic attack (TIA), and cerebral infarction without residual deficits: Secondary | ICD-10-CM

## 2022-07-14 DIAGNOSIS — R400 Somnolence: Secondary | ICD-10-CM | POA: Diagnosis not present

## 2022-07-14 LAB — COMPREHENSIVE METABOLIC PANEL
ALT: 19 U/L (ref 0–44)
AST: 43 U/L — ABNORMAL HIGH (ref 15–41)
Albumin: 3.3 g/dL — ABNORMAL LOW (ref 3.5–5.0)
Alkaline Phosphatase: 112 U/L (ref 38–126)
Anion gap: 7 (ref 5–15)
BUN: 11 mg/dL (ref 8–23)
CO2: 25 mmol/L (ref 22–32)
Calcium: 8.7 mg/dL — ABNORMAL LOW (ref 8.9–10.3)
Chloride: 105 mmol/L (ref 98–111)
Creatinine, Ser: 0.63 mg/dL (ref 0.61–1.24)
GFR, Estimated: >60 mL/min (ref >60)
Glucose, Bld: 164 mg/dL — ABNORMAL HIGH (ref 70–99)
Potassium: 4.4 mmol/L (ref 3.5–5.1)
Sodium: 137 mmol/L (ref 135–145)
Total Bilirubin: 4.5 mg/dL — ABNORMAL HIGH (ref 0.3–1.2)
Total Protein: 6.9 g/dL (ref 6.5–8.1)

## 2022-07-14 LAB — CBC
HCT: 42.5 % (ref 39.0–52.0)
Hemoglobin: 14.4 g/dL (ref 13.0–17.0)
MCH: 31.5 pg (ref 26.0–34.0)
MCHC: 33.9 g/dL (ref 30.0–36.0)
MCV: 93 fL (ref 80.0–100.0)
Platelets: 48 10*3/uL — ABNORMAL LOW (ref 150–400)
RBC: 4.57 MIL/uL (ref 4.22–5.81)
RDW: 13.8 % (ref 11.5–15.5)
WBC: 4.1 10*3/uL (ref 4.0–10.5)
nRBC: 0 % (ref 0.0–0.2)

## 2022-07-14 LAB — HEMOGLOBIN A1C
Hgb A1c MFr Bld: 8.4 % — ABNORMAL HIGH (ref 4.8–5.6)
Mean Plasma Glucose: 194.38 mg/dL

## 2022-07-14 LAB — GLUCOSE, CAPILLARY
Glucose-Capillary: 196 mg/dL — ABNORMAL HIGH (ref 70–99)
Glucose-Capillary: 243 mg/dL — ABNORMAL HIGH (ref 70–99)

## 2022-07-14 LAB — MAGNESIUM: Magnesium: 1.9 mg/dL (ref 1.7–2.4)

## 2022-07-14 LAB — CBG MONITORING, ED
Glucose-Capillary: 125 mg/dL — ABNORMAL HIGH (ref 70–99)
Glucose-Capillary: 142 mg/dL — ABNORMAL HIGH (ref 70–99)

## 2022-07-14 LAB — HIV ANTIBODY (ROUTINE TESTING W REFLEX): HIV Screen 4th Generation wRfx: NONREACTIVE

## 2022-07-14 LAB — PHOSPHORUS: Phosphorus: 3 mg/dL (ref 2.5–4.6)

## 2022-07-14 NOTE — ED Notes (Signed)
Patient is sleeping at this time. No apparent distress. Will continue to round on patient.

## 2022-07-14 NOTE — Assessment & Plan Note (Signed)
H/O DVT. We will continue scd's.

## 2022-07-14 NOTE — ED Notes (Signed)
Patient continues to sleep. No distress. Patient revitalized.

## 2022-07-14 NOTE — ED Notes (Signed)
Patient insisted on using commode to have a BM. Patient had a large BM. Assisted patient to and from commode safely. Patient had an unsteady gait, and needed a great deal of assistance to prevent fall. Patient sheets and gown changed as stool had stained them. Bed alarm in place. Patient is more vocal, but is still confused.

## 2022-07-14 NOTE — ED Notes (Signed)
Flexiseal inserted/ applied at this time

## 2022-07-14 NOTE — Progress Notes (Signed)
PROGRESS NOTE    Lawrence Osborn  GYK:[id number] DOB: [birth year] DOA: 07/13/2022 PCP: Rusty Aus, MD   Brief Narrative: This 71 years old male with PMH significant for liver cirrhosis, COPD, diabetes melitis, DVT, atrial fibrillation, history of GI bleed, hypothyroidism, hypertension, lupus presented in the ED for shaking,  thought to be from the seizures.  History is obtained from the chart,  Patient was found down on the floor with altered mentation.  Blood sugar was found to be 200 per EMS, wife reports patient started twitching and she was worried about seizures.  Stroke code was called on patient's arrival.  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  Neurologist recommended EEG which shows no evidence of seizures.  Patient was started on Keppra and given Ativan for suspected possible seizures.  Assessment & Plan:   Principal Problem:   AMS (altered mental status) Active Problems:   Acute hepatic encephalopathy (HCC)   Seizure disorder (HCC)   PAF (paroxysmal atrial fibrillation) (HCC)   Prolonged QT interval   CAD (coronary artery disease)   COPD (chronic obstructive pulmonary disease) (HCC)   Hypothyroidism   Thrombocytopenia (HCC)   Gastroesophageal reflux disease without esophagitis   Type 2 diabetes mellitus with peripheral neuropathy (HCC)   Acute deep vein thrombosis (DVT) of proximal vein of right lower extremity (HCC)  Acute metabolic encephalopathy: Likely due to hepatic encephalopathy in the setting of liver cirrhosis, less likely seizures. Neurology was consulted for stroke.  Imaging no large vessel occlusion identified. Ammonia level elevated, Patient was given lactulose enema in the ED. EEG: No evidence of seizures.  Mental status has improved. Continue neurochecks every 4 hours Continue seizure precautions, aspiration precautions and fall precautions Continue CIWA protocol.  Continue  lactulose  Seizure disorder: Continue seizure precautions. S/p EEG: No evidence of seizures. Continue Keppra twice daily Avoid medications that lower seizure threshold, tramadol/Levaquin. Neurology recommended to continue Keppra, close outpatient follow-up with neurology.  Paroxysmal atrial fibrillation: Continue digoxin, nadolol.  Heart rate reasonably controlled He is not on anticoagulation likely due to the history of GI bleed.  Coronary artery disease: Stable.  Patient has normal stress test 06/2021. LVEF 55 to 65%.  Continue home medications.  COPD: Stable, not in any acute exacerbation Continue supplemental oxygen and keep saturation above 93%  Hypothyroidism: Continue levothyroxine 100 mcg daily  Thrombocytopenia: Avoid anticoagulation, antiplatelet therapy. Likely secondary to portal hypertension and liver cirrhosis  GERD: Continue pantoprazole therapy  Type 2 diabetes: Hold p.o. diabetic medications Continue sliding scale  History of DVT: Patient is not on anticoagulation could be due to the history of GI bleed.  DVT prophylaxis: SCDs Code Status: Full code Family Communication: No family at bedside Disposition Plan:   Status is: Inpatient Remains inpatient appropriate because: Admitted for altered mental status likely secondary to hepatic encephalopathy.   Consultants:  Neurology  Procedures: CT head, CTA head and neck Antimicrobials: None  Subjective: Patient was seen and examined at bedside.  Overnight events noted. Patient is improving.  He is alert and oriented following commands.  Objective: Vitals:   07/14/22 0730 07/14/22 0745 07/14/22 0800 07/14/22 0851  BP: 109/68  112/73 128/80  Pulse: (!) 104 (!) 104 (!) 104 (!) 107  Resp: (!) _0 Temp: 97.7 F (36.5 C) (!) 97.5 F (36.4 C) 97.6 F (36.4 C) (!) 97.5 F (36.4 C)  TempSrc: Bladder   Oral  SpO2: 97%  98% 99% 98%  Weight:      Height:        Intake/Output Summary (Last 24  hours) at 07/14/2022 1222 Last data filed at 07/14/2022 0738 Gross per 24 hour  Intake 852.95 ml  Output 1940 ml  Net -1087.05 ml   Filed Weights   07/13/22 1407  Weight: 100.9 kg    Examination:  General exam: Appears comfortable, not in any acute distress, deconditioned Respiratory system: CTA bilaterally, respiratory effort normal, RR 15 Cardiovascular system: S1 & S2 heard, regular rate and rhythm, no murmur. Gastrointestinal system: Abdomen is soft, distended, non tender, BS+ Central nervous system: Alert and oriented X2. No focal neurological deficits. Extremities: No edema, no cyanosis, no clubbing Skin: No rashes, lesions or ulcers Psychiatry: Judgement and insight appear normal. Mood & affect appropriate.     Data Reviewed: I have personally reviewed following labs and imaging studies  CBC: Recent Labs  Lab 07/13/22 1405 07/14/22 0825  WBC 4.0 4.1  NEUTROABS 2.9  --   HGB 14.7 14.4  HCT 42.0 42.5  MCV 90.1 93.0  PLT 50* 48*   Basic Metabolic Panel: Recent Labs  Lab 07/13/22 1405 07/13/22 1552 07/14/22 0825  NA 138  --  137  K 5.2*  --  4.4  CL 104  --  105  CO2 25  --  25  GLUCOSE 181*  --  164*  BUN 15  --  11  CREATININE 0.81  --  0.63  CALCIUM 9.2  --  8.7*  MG  --  1.7 1.9  PHOS  --   --  3.0   GFR: Estimated Creatinine Clearance: 100.3 mL/min (by C-G formula based on SCr of 0.63 mg/dL). Liver Function Tests: Recent Labs  Lab 07/13/22 1405 07/14/22 0825  AST 50* 43*  ALT 22 19  ALKPHOS 132* 112  BILITOT 3.4* 4.5*  PROT 7.3 6.9  ALBUMIN 3.3* 3.3*   No results for input(s): "LIPASE", "AMYLASE" in the last 168 hours. Recent Labs  Lab 07/13/22 1408  AMMONIA 79*   Coagulation Profile: Recent Labs  Lab 07/13/22 2102  INR 1.5*   Cardiac Enzymes: Recent Labs  Lab 07/13/22 2102  CKTOTAL 73   BNP (last 3 results) No results for input(s): "PROBNP" in the last 8760 hours. HbA1C: Recent Labs    07/13/22 2102  HGBA1C 8.4*    CBG: Recent Labs  Lab 07/13/22 1833 07/14/22 0001 07/14/22 0714  GLUCAP 155* 125* 142*   Lipid Profile: No results for input(s): "CHOL", "HDL", "LDLCALC", "TRIG", "CHOLHDL", "LDLDIRECT" in the last 72 hours. Thyroid Function Tests: No results for input(s): "TSH", "T4TOTAL", "FREET4", "T3FREE", "THYROIDAB" in the last 72 hours. Anemia Panel: No results for input(s): "VITAMINB12", "FOLATE", "FERRITIN", "TIBC", "IRON", "RETICCTPCT" in the last 72 hours. Sepsis Labs: Recent Labs  Lab 07/13/22 1405 07/13/22 1818 07/13/22 2102  LATICACIDVEN 3.0* 1.9 2.0*    No results found for this or any previous visit (from the past 240 hour(s)).   Radiology Studies: MR BRAIN WO CONTRAST  Result Date: 07/13/2022 CLINICAL DATA:  Unresponsive, concern for seizure EXAM: MRI HEAD WITHOUT CONTRAST TECHNIQUE: Multiplanar, multiecho pulse sequences of the brain and surrounding structures were obtained without intravenous contrast. COMPARISON:  08/09/2021 FINDINGS: Brain: No restricted diffusion to suggest acute or subacute infarct. No acute hemorrhage, mass, mass effect, or midline shift. No hydrocephalus or extra-axial collection. 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 Confluent and scattered T2 hyperintense signal in the periventricular  white matter, likely the sequela of moderate to severe chronic small vessel ischemic disease. Mildly advanced cerebral volume loss for age. Unchanged mild right hippocampal atrophy compared to the left. The hippocampi are symmetric in signal. No heterotopia or evidence of cortical dysgenesis. Vascular: Normal arterial flow voids. Skull and upper cervical spine: Normal marrow signal. Sinuses/Orbits: Minimal mucosal thickening in the left maxillary sinus. Otherwise clear paranasal sinuses. No acute finding in the orbits. Other: The mastoids are well aerated. IMPRESSION: No acute intracranial process. Redemonstrated mild  right hippocampal atrophy, compared to the left, without signal abnormality. Electronically Signed   By: Merilyn Baba M.D.   On: 07/13/2022 21:45   EEG adult  Result Date: 07/13/2022 Lora Havens, MD     07/13/2022  6:40 PM Patient Name: Lawrence Osborn MRN: [id number] Epilepsy Attending: Lora Havens Referring Physician/Provider: Lorenza Chick, MD Date: 07/13/2022 Duration: 28.24 mins Patient history: 71yo M with hepatic cirrhosis now with ams and twitching. EEG to evaluate for seizure. Level of alertness: lethargic AEDs during EEG study: Ativan Technical aspects: This EEG study was done with scalp electrodes positioned according to the 10-20 International system of electrode placement. Electrical activity was reviewed with band pass filter of 1-_0 , sensitivity of 7 uV/mm, display speed of 49m/sec with a _1  notched filter applied as appropriate. EEG data were recorded continuously and digitally stored.  Video monitoring was available and reviewed as appropriate. Description: EEG showed continuous generalized 2.5-3.5 Hz delta slowing. Generalized periodic discharges ( GPD) with triphasic morphology at  1-_2  were also noted. IV ativan 261mwas administered after which frequency of GPDs improved to ._3 .  Hyperventilation and photic stimulation were not performed.   ABNORMALITY - Periodic discharges with triphasic morphology, generalized ( GPDs) - Continuous slow, generalized IMPRESSION: This study showed generalized periodic discharges ( GPD) with triphasic morphology at 1-_4  with improvement in frequency after IV ativan but no significant change in pattern and clinical status. Given h/o hepatic cirrhosis, this EEG pattern is most likely due to toxic-metabolic causes like hyperammonemia. Additionally, there is severe diffuse encephalopathy. No seizures or definite epileptiform discharges were seen throughout the recording.  If suspicion for ictal-interictal activity remains a concern, a prolonged  study can be considered. Priyanka O Barbra Sarks CT ANGIO HEAD NECK W WO CM  Result Date: 07/13/2022 CLINICAL DATA:  Provided history: Neuro deficit, acute, stroke suspected. EXAM: CT ANGIOGRAPHY HEAD AND NECK TECHNIQUE: Multidetector CT imaging of the head and neck was performed using the standard protocol during bolus administration of intravenous contrast. Multiplanar CT image reconstructions and MIPs were obtained to evaluate the vascular anatomy. Carotid stenosis measurements (when applicable) are obtained utilizing NASCET criteria, using the distal internal carotid diameter as the denominator. RADIATION DOSE REDUCTION: This exam was performed according to the departmental dose-optimization program which includes automated exposure control, adjustment of the mA and/or kV according to patient size and/or use of iterative reconstruction technique. CONTRAST:  1005mMNIPAQUE IOHEXOL 350 MG/ML SOLN COMPARISON:  Noncontrast head CT performed earlier today 07/13/2022. Cervical spine CT performed earlier today 07/13/2022. Carotid artery duplex 05/18/2021. FINDINGS: CTA NECK FINDINGS Aortic arch: Standard aortic branching. Atherosclerotic plaque within the visualized aortic arch and proximal major branch vessels of the neck. Streak and beam hardening artifact arising from a dense left-sided contrast bolus partially obscures the left subclavian artery. Within this limitation, there is no appreciable hemodynamically significant innominate or proximal subclavian artery stenosis. Right carotid system: CCA and ICA patent within the neck without stenosis. Atherosclerotic  plaque about the carotid bifurcation and within the proximal ICA. Left carotid system: CCA and ICA patent within the neck. Nonstenotic atherosclerotic plaque at the CCA origin. Atherosclerotic plaque about the left carotid bifurcation and within the proximal left ICA, resulting in less than 50% stenosis of the proximal ICA. Vertebral arteries: The right  vertebral artery is patent within the neck. Nonstenotic atherosclerotic plaque at the origin of this vessel. 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 There is some reconstitution of enhancement within this vessel beginning at the V3 segment. Skeleton: Cervical spondylosis. No acute fracture or aggressive osseous lesion. Other neck: No neck mass or cervical lymphadenopathy. Upper chest: No consolidation within the imaged lung apices. Minimal paraseptal emphysema. Review of the MIP images confirms the above findings CTA HEAD FINDINGS Anterior circulation: The intracranial internal carotid arteries are patent. Atherosclerotic plaque within both vessels. Moderate stenosis within the distal petrous/proximal cavernous left ICA. No more than mild stenosis elsewhere within the intracranial internal carotid arteries. The M1 middle cerebral arteries are patent. Atherosclerotic irregularity of the M2 and more distal MCA vessels, bilaterally. No M2 proximal branch occlusion or high-grade proximal stenosis is identified. The anterior cerebral arteries are patent. No intracranial aneurysm is identified. Posterior circulation: The intracranial vertebral arteries are patent. The basilar artery is patent. The posterior cerebral arteries are patent. Atherosclerotic irregularity of both vessels without high-grade proximal stenosis. Hypoplastic P1 segments with sizable posterior communicating arteries, bilaterally. Venous sinuses: Within the limitations of contrast timing, no convincing thrombus. Anatomic variants: None significant. Review of the MIP images confirms the above findings Exam findings discussed with Dr. Curly Shores by telephone at 4:50 p.m. on 07/13/2022. IMPRESSION: CTA neck: 1. 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 There is some reconstitution of enhancement within this vessel beginning at the V3 segment level. Notably, retrograde flow was described  within the cervical left vertebral artery on the prior carotid artery duplex of 05/18/2021. The right vertebral artery is patent within the neck. Nonstenotic atherosclerotic plaque at the origin of this vessel. 2. The common carotid and internal carotid arteries are patent within the neck without hemodynamically significant stenosis. Atherosclerotic plaque, bilaterally. 3.  Aortic Atherosclerosis (ICD10-I70.0). CTA head: 1. No intracranial large vessel occlusion is identified. 2. Intracranial atherosclerotic disease, as described. Most notably, there is moderate atherosclerotic narrowing of the distal petrous/proximal cavernous left ICA. Electronically Signed   By: Kellie Simmering D.O.   On: 07/13/2022 17:22   CT Head Wo Contrast  Result Date: 07/13/2022 CLINICAL DATA:  Altered mental status.  Possible fall. EXAM: CT HEAD WITHOUT CONTRAST CT CERVICAL SPINE WITHOUT CONTRAST TECHNIQUE: Multidetector CT imaging of the head and cervical spine was performed following the standard protocol without intravenous contrast. Multiplanar CT image reconstructions of the cervical spine were also generated. RADIATION DOSE REDUCTION: This exam was performed according to the departmental dose-optimization program which includes automated exposure control, adjustment of the mA and/or kV according to patient size and/or use of iterative reconstruction technique. COMPARISON:  MRI brain dated August 09, 2021. CT head and cervical spine dated May 18, 2021. FINDINGS: CT HEAD FINDINGS Brain: No evidence of acute infarction, hemorrhage, hydrocephalus, extra-axial collection or mass lesion/mass effect. 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 Stable mild atrophy and chronic microvascular ischemic changes. Vascular: Atherosclerotic vascular calcification of the carotid siphons. No hyperdense vessel. Skull: Normal. Negative for fracture or focal lesion. Sinuses/Orbits: No acute finding. Other: None. CT CERVICAL SPINE FINDINGS Alignment:  No  traumatic malalignment. Chronic straightening of the normal cervical lordosis. Skull base and vertebrae: No acute fracture. No primary bone lesion or focal pathologic process. Soft tissues and spinal canal: No prevertebral fluid or swelling. No visible canal hematoma. Disc levels: Degenerative interbody ankylosis at C5-C6 again noted. Unchanged moderate disc height loss at C3-C4. Similar moderate left neuroforaminal stenosis at C3-C4 and C5-C6 due to uncovertebral hypertrophy. Upper chest: Negative. Other: None. IMPRESSION: 1. No acute intracranial abnormality. Old right parietal infarct. 2. No acute cervical spine fracture or traumatic listhesis. Electronically Signed   By: Titus Dubin M.D.   On: 07/13/2022 15:01   CT Cervical Spine Wo Contrast  Result Date: 07/13/2022 CLINICAL DATA:  Altered mental status.  Possible fall. EXAM: CT HEAD WITHOUT CONTRAST CT CERVICAL SPINE WITHOUT CONTRAST TECHNIQUE: Multidetector CT imaging of the head and cervical spine was performed following the standard protocol without intravenous contrast. Multiplanar CT image reconstructions of the cervical spine were also generated. RADIATION DOSE REDUCTION: This exam was performed according to the departmental dose-optimization program which includes automated exposure control, adjustment of the mA and/or kV according to patient size and/or use of iterative reconstruction technique. COMPARISON:  MRI brain dated August 09, 2021. CT head and cervical spine dated May 18, 2021. FINDINGS: CT HEAD FINDINGS Brain: No evidence of acute infarction, hemorrhage, hydrocephalus, extra-axial collection or mass lesion/mass effect. 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 Stable mild atrophy and chronic microvascular ischemic changes. Vascular: Atherosclerotic vascular calcification of the carotid siphons. No hyperdense vessel. Skull: Normal. Negative for fracture or focal lesion. Sinuses/Orbits: No acute finding. Other: None. CT CERVICAL  SPINE FINDINGS Alignment: No traumatic malalignment. Chronic straightening of the normal cervical lordosis. Skull base and vertebrae: No acute fracture. No primary bone lesion or focal pathologic process. Soft tissues and spinal canal: No prevertebral fluid or swelling. No visible canal hematoma. Disc levels: Degenerative interbody ankylosis at C5-C6 again noted. Unchanged moderate disc height loss at C3-C4. Similar moderate left neuroforaminal stenosis at C3-C4 and C5-C6 due to uncovertebral hypertrophy. Upper chest: Negative. Other: None. IMPRESSION: 1. No acute intracranial abnormality. Old right parietal infarct. 2. No acute cervical spine fracture or traumatic listhesis. Electronically Signed   By: Titus Dubin M.D.   On: 07/13/2022 15:01   DG Chest Portable 1 View  Result Date: 07/13/2022 CLINICAL DATA:  Altered mental status, shaking EXAM: PORTABLE CHEST 1 VIEW COMPARISON:  Portable exam 1419 hours without priors for comparison FINDINGS: Enlargement of cardiac silhouette. Mediastinal contours and pulmonary vascularity normal. Lungs clear. No acute infiltrate, pleural effusion, or pneumothorax. Atherosclerotic calcification aorta. Scattered endplate spur formation thoracic spine. IMPRESSION: Enlargement of cardiac silhouette without acute infiltrate. Aortic Atherosclerosis (ICD10-I70.0). Electronically Signed   By: Lavonia Dana M.D.   On: 07/13/2022 14:50    Scheduled Meds:  chlorhexidine gluconate (MEDLINE KIT)  15 mL Mouth Rinse BID   digoxin  0.25 mg Oral Daily   ezetimibe  10 mg Oral Daily   insulin aspart  0-6 Units Subcutaneous Q4H   levothyroxine  100 mcg Oral Q0600   nadolol  20 mg Oral Daily   mouth rinse  15 mL Mouth Rinse 10 times per day   pantoprazole (PROTONIX) IV  40 mg Intravenous Q12H   sodium chloride flush  3 mL Intravenous Q12H   Continuous Infusions:  sodium chloride Stopped (07/14/22 0829)   levETIRAcetam 1,000 mg (07/14/22 1047)   thiamine (VITAMIN B1) injection  500 mg (07/14/22 1013)     LOS: 1 day  Time spent: 50 mins    Shawna Clamp, MD [facility]   If 7PM-7AM, please contact night-coverage

## 2022-07-14 NOTE — Progress Notes (Signed)
Neurology Progress Note  Patient ID: Lawrence Osborn is a 71 y.o. with PMHx of  has a past medical history of Anemia, Cirrhosis of liver (HCC), COPD (chronic obstructive pulmonary disease) (HCC), Diabetes mellitus without complication (HCC), DVT (deep venous thrombosis) (HCC), Dysrhythmia, GI bleed, HLD (hyperlipidemia), Hypertension, Hypothyroidism, Lupus (systemic lupus erythematosus) (HCC), Peritonitis (HCC), Pneumothorax, Syncope (08/30/2018), Thoracic radiculopathy (07/16/2016), and Toe infection.   Subjective: - Amnestic to recent events, no acute complaints  Exam: Current vital signs: BP 128/80 (BP Location: Right Arm)   Pulse (!) 107   Temp (!) 97.5 F (36.4 C) (Oral)   Resp 14   Ht 5\' 8"  (1.727 m)   Wt 100.9 kg   SpO2 98%   BMI 33.82 kg/m  Vital signs in last 24 hours: Temp:  [97.2 F (36.2 C)-98.3 F (36.8 C)] 97.5 F (36.4 C) (10/28 0851) Pulse Rate:  [95-114] 107 (10/28 0851) Resp:  [12-22] 14 (10/28 0851) BP: (109-157)/(67-138) 128/80 (10/28 0851) SpO2:  [96 %-100 %] 98 % (10/28 0851) Weight:  [100.9 kg] 100.9 kg (10/27 1407)   Gen: In bed, comfortable  Resp: non-labored breathing, no grossly audible wheezing Cardiac: Perfusing extremities well  Abd: soft, nt  Neuro: MS: Awake, alert, oriented to [facility], October 2023 but reports is age 36.  Following some commands.  Fluent speech.  No evidence of neglect.  Normal CN: Pupils equal round reactive to light.  Saccadic pursuits with some saccadic intrusions.  Face symmetric, tongue midline Motor: Mild to moderate asterixis of the bilateral upper extremities.  Equally moves bilateral lower extremities to command antigravity Sensory: Intact to light touch in all 4 extremities   Pertinent Labs:   Basic Metabolic Panel: Recent Labs  Lab 07/13/22 1405 07/13/22 1552 07/14/22 0825  NA 138  --  137  K 5.2*  --  4.4  CL 104  --  105  CO2 25  --  25  GLUCOSE 181*  --  164*  BUN 15  --  11  CREATININE 0.81  --  0.63   CALCIUM 9.2  --  8.7*  MG  --  1.7 1.9  PHOS  --   --  3.0    CBC: Recent Labs  Lab 07/13/22 1405 07/14/22 0825  WBC 4.0 4.1  NEUTROABS 2.9  --   HGB 14.7 14.4  HCT 42.0 42.5  MCV 90.1 93.0  PLT 50* 48*    Coagulation Studies: Recent Labs    07/13/22 25-Jan-2101  LABPROT 18.3*  INR 1.5*      Impression: Marked improvement in mental status after receiving Keppra and with lactulose enema and supportive care with per primary team.  Appreciate primary team following up reason for keppra dose reduction; wife could not remember details at the time of our conversation and I am unable to find notes documenting this in the chart at this time.  Patient denies any side effects from the medication but he also reports he thought it was for his ammonia.  His examination is markedly improving today though he remains slightly encephalopathic and does have some asterixis consistent with a toxic metabolic encephalopathy likely secondary to hepatic encephalopathy given his history and ammonia levels (though not as severely elevated as in past admissions, ammonia levels do not correlate well with encephalopathy severity)  While EEGs have not clearly captured ictal activity, he has had triphasics reaching a frequency that meets criteria for seizures and he has significant risk factor of a prior cortical stroke and hippocampal atrophy, with  multiple presentations for altered mental status where it is hard to distinguish hepatic encephalopathy versus seizure.  Recommendations: -Resume Keppra 1 g twice daily -Would recommend not tapering this medication unless there are severe side effects and considering an alternative antiseizure medication if he is not tolerating Keppra -Close outpatient follow-up with neurology, inpatient neurology will be available as needed, please do not hesitate to reach out if additional questions or concerns arise.  Lesleigh Noe MD-PhD [facility] [phone]

## 2022-07-14 NOTE — ED Notes (Signed)
BG 125 - No insulin needed at this time

## 2022-07-14 NOTE — ED Notes (Signed)
Preoccupied with sleep. Arousable to voice, able to hold limited conversation, oriented to place, month, name. Answered age wrong "57", disoriented to why here. Asking for wife. NAD, calm, interactive, resps e/u, speaking clearly in phrases. VSS.

## 2022-07-14 NOTE — ED Notes (Signed)
Remains preoccupied with sleep, arousable to voice and interactive, NAD, calm, morning labs drawn and sent, up to floor via stretcher, remains NPO at this time.

## 2022-07-14 NOTE — ED Notes (Signed)
Patient only took in about 500cc of Lactulose before he defecated it out

## 2022-07-14 NOTE — ED Notes (Signed)
Valarie Merino RN aware of assigned bed

## 2022-07-14 NOTE — Assessment & Plan Note (Addendum)
Seizure precautions. Pt s/p EEG and neurology consulted. Cont keppra.  Avoid meds that lower seizure threshold, I.e tramadol / Levaquin etc.

## 2022-07-15 DIAGNOSIS — R400 Somnolence: Secondary | ICD-10-CM | POA: Diagnosis not present

## 2022-07-15 LAB — GLUCOSE, CAPILLARY
Glucose-Capillary: 141 mg/dL — ABNORMAL HIGH (ref 70–99)
Glucose-Capillary: 142 mg/dL — ABNORMAL HIGH (ref 70–99)
Glucose-Capillary: 183 mg/dL — ABNORMAL HIGH (ref 70–99)
Glucose-Capillary: 187 mg/dL — ABNORMAL HIGH (ref 70–99)
Glucose-Capillary: 232 mg/dL — ABNORMAL HIGH (ref 70–99)
Glucose-Capillary: 233 mg/dL — ABNORMAL HIGH (ref 70–99)
Glucose-Capillary: 236 mg/dL — ABNORMAL HIGH (ref 70–99)

## 2022-07-15 MED ORDER — CHLORHEXIDINE GLUCONATE CLOTH 2 % EX PADS
6.0000 | MEDICATED_PAD | Freq: Every day | CUTANEOUS | Status: DC
  Administered 2022-07-15 – 2022-07-16 (×2): 6 via TOPICAL

## 2022-07-15 NOTE — Evaluation (Signed)
Occupational Therapy Evaluation Patient Details Name: Lawrence Osborn MRN: [id number] DOB: May 23, 1951 Today's Date: 07/15/2022   History of Present Illness Pt is a 71 y.o. male presenting to hospital 10/27 with AMS (found down by wife on floor) and shaking (thought to be from seizures).  Pt admited with acute metabolic encephalopathy, seizure disorder, and paroxysmal a-fib.    PMH includes anemia, COPD, DM, htn, alcoholic cirrhosis, CAD, PAF on Eliquis, DVT, pneumothorax, PE, peripheral neuropathy, hepatic encephalopathy.   Clinical Impression   Pt agreeable to OT/PT co-treatment to maximize safety and participation. Pt presenting with decreased independence in self care, balance, functional mobility/transfers, and endurance. At baseline, pt is independent with ADLs and lives with his wife who can provide assistance as needed upon discharge. Pt currently functioning at supervision-Min A for bed mobility, Min guard for functional transfers, and Min guard for functional mobility around the unit without an AD. Pt completed UB dressing with Min A and Max A for posterior hygiene in standing. Pt will benefit from acute OT to increase overall independence in the areas of ADLs and functional mobility in order to safely discharge home. Pt could benefit from [facility] following D/C to decrease falls risk, improve balance, and maximize independence in self-care within own home environment.      Recommendations for follow up therapy are one component of a multi-disciplinary discharge planning process, led by the attending physician.  Recommendations may be updated based on patient status, additional functional criteria and insurance authorization.   Follow Up Recommendations  Home health OT    Assistance Recommended at Discharge Frequent or constant Supervision/Assistance  Patient can return home with the following A little help with walking and/or transfers;A little help with bathing/dressing/bathroom;Assistance  with cooking/housework;Help with stairs or ramp for entrance;Assist for transportation    Functional Status Assessment  Patient has had a recent decline in their functional status and demonstrates the ability to make significant improvements in function in a reasonable and predictable amount of time.  Equipment Recommendations  None recommended by OT    Recommendations for Other Services       Precautions / Restrictions Precautions Precautions: Fall Precaution Comments: Seizure precautions Restrictions Weight Bearing Restrictions: No      Mobility Bed Mobility Overal bed mobility: Needs Assistance Bed Mobility: Supine to Sit, Sit to Supine     Supine to sit: Min assist Sit to supine: Supervision   General bed mobility comments: assist for trunk elevation    Transfers Overall transfer level: Needs assistance Equipment used: None Transfers: Sit to/from Stand Sit to Stand: Min guard           General transfer comment: general shakiness noted but overall steady standing with UE support      Balance Overall balance assessment: Needs assistance Sitting-balance support: No upper extremity supported, Feet supported Sitting balance-Leahy Scale: Good     Standing balance support: No upper extremity supported, During functional activity Standing balance-Leahy Scale: Good Standing balance comment: no LOB                           ADL either performed or assessed with clinical judgement   ADL Overall ADL's : Needs assistance/impaired                 Upper Body Dressing : Minimal assistance;Standing Upper Body Dressing Details (indicate cue type and reason): to don/doff gown     Toilet Transfer: Min Pension scheme manager Details (indicate cue type  and reason): simulated with STS from EOB, no AD Toileting- Clothing Manipulation and Hygiene: Maximal assistance;Sit to/from stand Toileting - Clothing Manipulation Details (indicate cue type and  reason): pt requesting assistance with posterior hygiene     Functional mobility during ADLs: Min guard;+2 for safety/equipment (no AD, ~227ft)       Vision Baseline Vision/History: 1 Wears glasses Patient Visual Report: No change from baseline       Perception     Praxis      Pertinent Vitals/Pain Pain Assessment Pain Assessment: No/denies pain     Hand Dominance     Extremity/Trunk Assessment Upper Extremity Assessment Upper Extremity Assessment: Generalized weakness (pt endorses BUE shakiness/tremors at baseline)   Lower Extremity Assessment Lower Extremity Assessment: Generalized weakness   Cervical / Trunk Assessment Cervical / Trunk Assessment: Normal   Communication Communication Communication: No difficulties   Cognition Arousal/Alertness: Awake/alert Behavior During Therapy: WFL for tasks assessed/performed Overall Cognitive Status: Within Functional Limits for tasks assessed                                 General Comments: A&O x4 (general situation--reports here d/t fall but does not know how he fell)     General Comments  bruise noted to R side of forehead    Exercises Other Exercises Other Exercises: OT provided education re: role of OT, OT POC, post acute recs, sitting up for all meals, EOB/OOB mobility with assistance, home/fall safety.     Shoulder Instructions      Home Living Family/patient expects to be discharged to:: Private residence Living Arrangements: Spouse/significant other Available Help at Discharge: Family;Available 24 hours/day Type of Home: House Home Access: Stairs to enter [employer] of Steps: 2 Entrance Stairs-Rails: Right;Left;Can reach both Home Layout: One level     Bathroom Shower/Tub: Occupational psychologist: Standard     Home Equipment: Rollator (4 wheels);Cane - single point;Grab bars - tub/shower;Shower seat          Prior Functioning/Environment Prior Level of  Function : Independent/Modified Independent;History of Falls (last six months)               ADLs Comments: Independent with ADLs. Wife provides transportation.        OT Problem List: Decreased strength;Decreased activity tolerance;Impaired balance (sitting and/or standing);Decreased knowledge of use of DME or AE;Decreased knowledge of precautions      OT Treatment/Interventions: Self-care/ADL training;Therapeutic exercise;Patient/family education;Balance training;Energy conservation;Therapeutic activities;DME and/or AE instruction    OT Goals(Current goals can be found in the care plan section) Acute Rehab OT Goals Patient Stated Goal: to go home OT Goal Formulation: With patient Time For Goal Achievement: 07/29/22 Potential to Achieve Goals: Good   OT Frequency: Min 2X/week    Co-evaluation PT/OT/SLP Co-Evaluation/Treatment: Yes Reason for Co-Treatment: For patient/therapist safety;To address functional/ADL transfers PT goals addressed during session: Mobility/safety with mobility;Balance OT goals addressed during session: ADL's and self-care      AM-PAC OT "6 Clicks" Daily Activity     Outcome Measure Help from another person eating meals?: None Help from another person taking care of personal grooming?: A Little Help from another person toileting, which includes using toliet, bedpan, or urinal?: A Little Help from another person bathing (including washing, rinsing, drying)?: A Little Help from another person to put on and taking off regular upper body clothing?: A Little Help from another person to put on and taking off  regular lower body clothing?: A Little 6 Click Score: 19   End of Session Equipment Utilized During Treatment: Gait belt Nurse Communication: Mobility status  Activity Tolerance: Patient tolerated treatment well Patient left: in bed;with call bell/phone within reach  OT Visit Diagnosis: Unsteadiness on feet (R26.81);Muscle weakness (generalized)  (M62.81);History of falling (Z91.81)                Time: 0960-4540 OT Time Calculation (min): 22 min Charges:  OT General Charges $OT Visit: 1 Visit OT Evaluation $OT Eval Low Complexity: 1 Low  [facility] MS, OTR/L ascom [phone]  07/15/22, 5:19 PM

## 2022-07-15 NOTE — Evaluation (Signed)
Physical Therapy Evaluation Patient Details Name: Lawrence Osborn MRN: [id number] DOB: [birth year] Today's Date: 07/15/2022  History of Present Illness  Pt is a 71 y.o. male presenting to hospital 10/27 with AMS (found down by wife on floor) and shaking (thought to be from seizures).  Pt admited with acute metabolic encephalopathy, seizure disorder, and paroxysmal a-fib.    PMH includes anemia, COPD, DM, htn, alcoholic cirrhosis, CAD, PAF on Eliquis, DVT, pneumothorax, PE, peripheral neuropathy, hepatic encephalopathy.  Clinical Impression  Prior to hospital admission, pt was independent with ambulation; lives with his wife in 1 level home with 2 STE B railings.  Currently pt is min assist supine to sitting edge of bed; CGA with transfers; CGA (plus 2nd for safety) ambulating 200 feet (no AD use); and SBA sit to supine in bed.  Pt demonstrating overall body "shakiness" during mobility (pt reports normally having this "shakiness" but is a bit worse than normal) but no overt loss of balance noted.  Pt would benefit from skilled PT to address noted impairments and functional limitations (see below for any additional details).  Upon hospital discharge, pt would benefit from [facility].    Recommendations for follow up therapy are one component of a multi-disciplinary discharge planning process, led by the attending physician.  Recommendations may be updated based on patient status, additional functional criteria and insurance authorization.  Follow Up Recommendations Home health PT      Assistance Recommended at Discharge Frequent or constant Supervision/Assistance  Patient can return home with the following  A little help with walking and/or transfers;A little help with bathing/dressing/bathroom;Assistance with cooking/housework;Help with stairs or ramp for entrance;Assist for transportation    Equipment Recommendations    Recommendations for Other Services  OT consult    Functional Status Assessment  Patient has had a recent decline in their functional status and demonstrates the ability to make significant improvements in function in a reasonable and predictable amount of time.     Precautions / Restrictions Precautions Precautions: Fall Precaution Comments: Seizure precautions Restrictions Weight Bearing Restrictions: No      Mobility  Bed Mobility Overal bed mobility: Needs Assistance Bed Mobility: Supine to Sit, Sit to Supine     Supine to sit: Min assist Sit to supine: Supervision   General bed mobility comments: assist for trunk supine to sitting    Transfers Overall transfer level: Needs assistance Equipment used: None Transfers: Sit to/from Stand Sit to Stand: Min guard           General transfer comment: general shakiness noted but overall steady standing with UE support    Ambulation/Gait Ambulation/Gait assistance: Min guard, +2 safety/equipment Gait Distance (Feet): 200 Feet Assistive device: None   Gait velocity: decreased     General Gait Details: partial step through gait pattern; general body shakiness noted with decreased B LE step length (improved step length noted with vc's which decreased pt's overall shakiness)  Stairs            Wheelchair Mobility    Modified Rankin (Stroke Patients Only)       Balance Overall balance assessment: Needs assistance Sitting-balance support: No upper extremity supported, Feet supported Sitting balance-Leahy Scale: Good Sitting balance - Comments: steady sitting reaching within BOS   Standing balance support: No upper extremity supported, During functional activity Standing balance-Leahy Scale: Good Standing balance comment: general shakiness but no overt loss of balance noted  Pertinent Vitals/Pain Pain Assessment Pain Assessment: No/denies pain HR 104-111 bpm during sessions activities.    Home Living Family/patient expects to be discharged to::  Private residence Living Arrangements: Spouse/significant other Available Help at Discharge: Family;Available 24 hours/day Type of Home: House Home Access: Stairs to enter Entrance Stairs-Rails: Right;Left;Can reach both Entrance Stairs-Number of Steps: 2   Home Layout: One level Home Equipment: Rollator (4 wheels);Cane - single point;Grab bars - tub/shower;Shower seat      Prior Function Prior Level of Function : Independent/Modified Independent               ADLs Comments: pt's wife drives (doesn't allow pt to drive)     Hand Dominance        Extremity/Trunk Assessment   Upper Extremity Assessment Upper Extremity Assessment: Defer to OT evaluation;Overall WFL for tasks assessed    Lower Extremity Assessment Lower Extremity Assessment: Generalized weakness    Cervical / Trunk Assessment Cervical / Trunk Assessment: Normal  Communication   Communication: No difficulties  Cognition Arousal/Alertness: Awake/alert Behavior During Therapy: WFL for tasks assessed/performed Overall Cognitive Status: Within Functional Limits for tasks assessed                                 General Comments: A&O x4 (general situation--reports here d/t fall but does not know how he fell)        General Comments General comments (skin integrity, edema, etc.): bruising noted to forehead    Exercises     Assessment/Plan    PT Assessment Patient needs continued PT services  PT Problem List Decreased strength;Decreased balance;Decreased mobility;Decreased knowledge of use of DME;Decreased knowledge of precautions       PT Treatment Interventions DME instruction;Gait training;Stair training;Functional mobility training;Therapeutic activities;Therapeutic exercise;Balance training;Patient/family education    PT Goals (Current goals can be found in the Care Plan section)  Acute Rehab PT Goals Patient Stated Goal: to improve mobility PT Goal Formulation: With  patient Time For Goal Achievement: 07/29/22 Potential to Achieve Goals: Good    Frequency Min 2X/week     Co-evaluation PT/OT/SLP Co-Evaluation/Treatment: Yes Reason for Co-Treatment: For patient/therapist safety;To address functional/ADL transfers PT goals addressed during session: Mobility/safety with mobility;Balance OT goals addressed during session: ADL's and self-care       AM-PAC PT "6 Clicks" Mobility  Outcome Measure Help needed turning from your back to your side while in a flat bed without using bedrails?: None Help needed moving from lying on your back to sitting on the side of a flat bed without using bedrails?: A Little Help needed moving to and from a bed to a chair (including a wheelchair)?: A Little Help needed standing up from a chair using your arms (e.g., wheelchair or bedside chair)?: A Little Help needed to walk in hospital room?: A Little Help needed climbing 3-5 steps with a railing? : A Little 6 Click Score: 19    End of Session Equipment Utilized During Treatment: Gait belt Activity Tolerance: Patient tolerated treatment well Patient left: in bed;with call bell/phone within reach;with bed alarm set Nurse Communication: Mobility status;Precautions;Other (comment) (pt requesting bath) PT Visit Diagnosis: Unsteadiness on feet (R26.81);Muscle weakness (generalized) (M62.81);History of falling (Z91.81)    Time: [id number] PT Time Calculation (min) (ACUTE ONLY): 22 min   Charges:   PT Evaluation $PT Eval Low Complexity: 1 Low         Devontae Casasola, PT 07/15/22, 4:57 PM

## 2022-07-15 NOTE — Progress Notes (Signed)
PROGRESS NOTE    Lawrence Osborn  UVO:[id number] DOB: 02-28-1951 DOA: 07/13/2022 PCP: Rusty Aus, MD   Brief Narrative: This 71 years old male with PMH significant for liver cirrhosis, COPD, diabetes melitis, DVT, atrial fibrillation, history of GI bleed, hypothyroidism, hypertension, lupus presented in the ED for shaking,  thought to be from the seizures.  History is obtained from the chart,  Patient was found down on the floor with altered mentation.  Blood sugar was found to be 200 per EMS, wife reports patient started twitching and she was worried about seizures.  Stroke code was called on patient's arrival.  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  Neurologist recommended EEG which shows no evidence of seizures.  Patient was started on Keppra and given Ativan for suspected possible seizures.  Assessment & Plan:   Principal Problem:   AMS (altered mental status) Active Problems:   Acute hepatic encephalopathy (HCC)   Seizure disorder (HCC)   PAF (paroxysmal atrial fibrillation) (HCC)   Prolonged QT interval   CAD (coronary artery disease)   COPD (chronic obstructive pulmonary disease) (HCC)   Hypothyroidism   Thrombocytopenia (HCC)   Gastroesophageal reflux disease without esophagitis   Type 2 diabetes mellitus with peripheral neuropathy (HCC)   Acute deep vein thrombosis (DVT) of proximal vein of right lower extremity (HCC)  Acute metabolic encephalopathy:> Improved. Likely due to hepatic encephalopathy in the setting of liver cirrhosis, less likely seizures. Neurology was consulted for stroke.  Imaging no large vessel occlusion identified. Ammonia level elevated, Patient was given lactulose enema in the ED. EEG: No evidence of seizures.  Mental status has improved. Continue neurochecks every 4 hours Continue seizure precautions, aspiration precautions and fall precautions. Continue CIWA protocol.   Continue lactulose. Patient seems back to his baseline mental status.  Seizure disorder: Continue seizure precautions. S/p EEG: No evidence of seizures. Continue Keppra twice daily. Avoid medications that lower seizure threshold, like tramadol/Levaquin. Neurology recommended to continue Keppra, close outpatient follow-up with neurology.  Paroxysmal atrial fibrillation: Continue digoxin, nadolol.  Heart rate reasonably controlled. He is not on anticoagulation likely due to the history of GI bleed.  Coronary artery disease: Stable.  Patient has normal stress test in 06/2021. Echo: LVEF 55 to 65%.  Continue home medications.  COPD: Stable, not in any acute exacerbation. Continue supplemental oxygen and keep saturation above 93%.  Hypothyroidism: Continue levothyroxine 100 mcg daily  Thrombocytopenia: Avoid anticoagulation, antiplatelet therapy. Likely secondary to portal hypertension and liver cirrhosis  GERD: Continue pantoprazole therapy.  Type 2 diabetes: Hold p.o. diabetic medications Continue sliding scale.  History of DVT: Patient is not on anticoagulation could be due to the history of GI bleed.  DVT prophylaxis: SCDs Code Status: Full code Family Communication: No family at bedside Disposition Plan:   Status is: Inpatient Remains inpatient appropriate because: Admitted for altered mental status likely secondary to hepatic encephalopathy.  Patient has 2 good sized bowel movement.  Patient is fully awake alert following commands.  PT and OT evaluation pending, anticipated discharge 07/16/2022 after PT assessment.   Consultants:  Neurology  Procedures: CT head, CTA head and neck Antimicrobials: None  Subjective: Patient was seen and examined at bedside.  Overnight events noted. Patient is much alert and awake,  back to his baseline mental status. Sitting comfortably in the chair,  having breakfast.  PT and OT assessment pending.  Objective: Vitals:    07/14/22 2325 07/15/22  1448 07/15/22 0423 07/15/22 0810  BP: 133/75 133/83  135/78  Pulse: (!) 110 (!) 102  92  Resp: _0 Temp: 98 F (36.7 C) 97.7 F (36.5 C)  97.7 F (36.5 C)  TempSrc:      SpO2: 96% 96%  97%  Weight:   98.6 kg   Height:        Intake/Output Summary (Last 24 hours) at 07/15/2022 1134 Last data filed at 07/15/2022 0418 Gross per 24 hour  Intake 329.99 ml  Output 1175 ml  Net -845.01 ml   Filed Weights   07/13/22 1407 07/14/22 1655 07/15/22 0423  Weight: 100.9 kg 100.8 kg 98.6 kg    Examination:  General exam: Appears comfortable, not in any acute distress, deconditioned. Respiratory system: CTA bilaterally, respiratory effort normal, RR 14 Cardiovascular system: S1 & S2 heard, regular rate and rhythm, no murmur. Gastrointestinal system: Abdomen is soft, distended, non tender, BS+ Central nervous system: Alert and oriented x3, no focal neurological deficits. Extremities: No edema, no cyanosis, no clubbing Skin: No rashes, lesions or ulcers Psychiatry: Judgement and insight appear normal. Mood & affect appropriate.     Data Reviewed: I have personally reviewed following labs and imaging studies  CBC: Recent Labs  Lab 07/13/22 1405 07/14/22 0825  WBC 4.0 4.1  NEUTROABS 2.9  --   HGB 14.7 14.4  HCT 42.0 42.5  MCV 90.1 93.0  PLT 50* 48*   Basic Metabolic Panel: Recent Labs  Lab 07/13/22 1405 07/13/22 1552 07/14/22 0825  NA 138  --  137  K 5.2*  --  4.4  CL 104  --  105  CO2 25  --  25  GLUCOSE 181*  --  164*  BUN 15  --  11  CREATININE 0.81  --  0.63  CALCIUM 9.2  --  8.7*  MG  --  1.7 1.9  PHOS  --   --  3.0   GFR: Estimated Creatinine Clearance: 99.2 mL/min (by C-G formula based on SCr of 0.63 mg/dL). Liver Function Tests: Recent Labs  Lab 07/13/22 1405 07/14/22 0825  AST 50* 43*  ALT 22 19  ALKPHOS 132* 112  BILITOT 3.4* 4.5*  PROT 7.3 6.9  ALBUMIN 3.3* 3.3*   No results for input(s): "LIPASE", "AMYLASE" in  the last 168 hours. Recent Labs  Lab 07/13/22 1408  AMMONIA 79*   Coagulation Profile: Recent Labs  Lab 07/13/22 2102  INR 1.5*   Cardiac Enzymes: Recent Labs  Lab 07/13/22 2102  CKTOTAL 73   BNP (last 3 results) No results for input(s): "PROBNP" in the last 8760 hours. HbA1C: Recent Labs    07/13/22 2102  HGBA1C 8.4*   CBG: Recent Labs  Lab 07/14/22 1610 07/14/22 2003 07/15/22 0004 07/15/22 0425 07/15/22 0811  GLUCAP 196* 243* 183* 142* 141*   Lipid Profile: No results for input(s): "CHOL", "HDL", "LDLCALC", "TRIG", "CHOLHDL", "LDLDIRECT" in the last 72 hours. Thyroid Function Tests: No results for input(s): "TSH", "T4TOTAL", "FREET4", "T3FREE", "THYROIDAB" in the last 72 hours. Anemia Panel: No results for input(s): "VITAMINB12", "FOLATE", "FERRITIN", "TIBC", "IRON", "RETICCTPCT" in the last 72 hours. Sepsis Labs: Recent Labs  Lab 07/13/22 1405 07/13/22 1818 07/13/22 2102  LATICACIDVEN 3.0* 1.9 2.0*    No results found for this or any previous visit (from the past 240 hour(s)).   Radiology Studies: MR BRAIN WO CONTRAST  Result Date: 07/13/2022 CLINICAL DATA:  Unresponsive, concern for seizure EXAM: MRI HEAD WITHOUT CONTRAST TECHNIQUE: Multiplanar,  multiecho pulse sequences of the brain and surrounding structures were obtained without intravenous contrast. COMPARISON:  08/09/2021 FINDINGS: Brain: No restricted diffusion to suggest acute or subacute infarct. No acute hemorrhage, mass, mass effect, or midline shift. No hydrocephalus or extra-axial collection. 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 Confluent and scattered T2 hyperintense signal in the periventricular white matter, likely the sequela of moderate to severe chronic small vessel ischemic disease. Mildly advanced cerebral volume loss for age. Unchanged mild right hippocampal atrophy compared to the left. The hippocampi are symmetric in signal.  No heterotopia or evidence of cortical dysgenesis. Vascular: Normal arterial flow voids. Skull and upper cervical spine: Normal marrow signal. Sinuses/Orbits: Minimal mucosal thickening in the left maxillary sinus. Otherwise clear paranasal sinuses. No acute finding in the orbits. Other: The mastoids are well aerated. IMPRESSION: No acute intracranial process. Redemonstrated mild right hippocampal atrophy, compared to the left, without signal abnormality. Electronically Signed   By: Merilyn Baba M.D.   On: 07/13/2022 21:45   EEG adult  Result Date: 07/13/2022 Lora Havens, MD     07/13/2022  6:40 PM Patient Name: Randel Hargens MRN: [id number] Epilepsy Attending: Lora Havens Referring Physician/Provider: Lorenza Chick, MD Date: 07/13/2022 Duration: 28.24 mins Patient history: 71yo M with hepatic cirrhosis now with ams and twitching. EEG to evaluate for seizure. Level of alertness: lethargic AEDs during EEG study: Ativan Technical aspects: This EEG study was done with scalp electrodes positioned according to the 10-20 International system of electrode placement. Electrical activity was reviewed with band pass filter of 1-_0 , sensitivity of 7 uV/mm, display speed of 81m/sec with a _1  notched filter applied as appropriate. EEG data were recorded continuously and digitally stored.  Video monitoring was available and reviewed as appropriate. Description: EEG showed continuous generalized 2.5-3.5 Hz delta slowing. Generalized periodic discharges ( GPD) with triphasic morphology at  1-_2  were also noted. IV ativan 237mwas administered after which frequency of GPDs improved to ._3 .  Hyperventilation and photic stimulation were not performed.   ABNORMALITY - Periodic discharges with triphasic morphology, generalized ( GPDs) - Continuous slow, generalized IMPRESSION: This study showed generalized periodic discharges ( GPD) with triphasic morphology at 1-_4  with improvement in frequency after IV ativan  but no significant change in pattern and clinical status. Given h/o hepatic cirrhosis, this EEG pattern is most likely due to toxic-metabolic causes like hyperammonemia. Additionally, there is severe diffuse encephalopathy. No seizures or definite epileptiform discharges were seen throughout the recording.  If suspicion for ictal-interictal activity remains a concern, a prolonged study can be considered. Priyanka O Barbra Sarks CT ANGIO HEAD NECK W WO CM  Result Date: 07/13/2022 CLINICAL DATA:  Provided history: Neuro deficit, acute, stroke suspected. EXAM: CT ANGIOGRAPHY HEAD AND NECK TECHNIQUE: Multidetector CT imaging of the head and neck was performed using the standard protocol during bolus administration of intravenous contrast. Multiplanar CT image reconstructions and MIPs were obtained to evaluate the vascular anatomy. Carotid stenosis measurements (when applicable) are obtained utilizing NASCET criteria, using the distal internal carotid diameter as the denominator. RADIATION DOSE REDUCTION: This exam was performed according to the departmental dose-optimization program which includes automated exposure control, adjustment of the mA and/or kV according to patient size and/or use of iterative reconstruction technique. CONTRAST:  10043mMNIPAQUE IOHEXOL 350 MG/ML SOLN COMPARISON:  Noncontrast head CT performed earlier today 07/13/2022. Cervical spine CT performed earlier today 07/13/2022. Carotid artery duplex 05/18/2021. FINDINGS: CTA NECK FINDINGS Aortic arch: Standard  aortic branching. Atherosclerotic plaque within the visualized aortic arch and proximal major branch vessels of the neck. Streak and beam hardening artifact arising from a dense left-sided contrast bolus partially obscures the left subclavian artery. Within this limitation, there is no appreciable hemodynamically significant innominate or proximal subclavian artery stenosis. Right carotid system: CCA and ICA patent within the neck without  stenosis. Atherosclerotic plaque about the carotid bifurcation and within the proximal ICA. Left carotid system: CCA and ICA patent within the neck. Nonstenotic atherosclerotic plaque at the CCA origin. Atherosclerotic plaque about the left carotid bifurcation and within the proximal left ICA, resulting in less than 50% stenosis of the proximal ICA. Vertebral arteries: The right vertebral artery is patent within the neck. Nonstenotic atherosclerotic plaque at the origin of this vessel. 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 There is some reconstitution of enhancement within this vessel beginning at the V3 segment. Skeleton: Cervical spondylosis. No acute fracture or aggressive osseous lesion. Other neck: No neck mass or cervical lymphadenopathy. Upper chest: No consolidation within the imaged lung apices. Minimal paraseptal emphysema. Review of the MIP images confirms the above findings CTA HEAD FINDINGS Anterior circulation: The intracranial internal carotid arteries are patent. Atherosclerotic plaque within both vessels. Moderate stenosis within the distal petrous/proximal cavernous left ICA. No more than mild stenosis elsewhere within the intracranial internal carotid arteries. The M1 middle cerebral arteries are patent. Atherosclerotic irregularity of the M2 and more distal MCA vessels, bilaterally. No M2 proximal branch occlusion or high-grade proximal stenosis is identified. The anterior cerebral arteries are patent. No intracranial aneurysm is identified. Posterior circulation: The intracranial vertebral arteries are patent. The basilar artery is patent. The posterior cerebral arteries are patent. Atherosclerotic irregularity of both vessels without high-grade proximal stenosis. Hypoplastic P1 segments with sizable posterior communicating arteries, bilaterally. Venous sinuses: Within the limitations of contrast timing, no convincing thrombus. Anatomic variants: None  significant. Review of the MIP images confirms the above findings Exam findings discussed with Dr. Curly Shores by telephone at 4:50 p.m. on 07/13/2022. IMPRESSION: CTA neck: 1. 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 There is some reconstitution of enhancement within this vessel beginning at the V3 segment level. Notably, retrograde flow was described within the cervical left vertebral artery on the prior carotid artery duplex of 05/18/2021. The right vertebral artery is patent within the neck. Nonstenotic atherosclerotic plaque at the origin of this vessel. 2. The common carotid and internal carotid arteries are patent within the neck without hemodynamically significant stenosis. Atherosclerotic plaque, bilaterally. 3.  Aortic Atherosclerosis (ICD10-I70.0). CTA head: 1. No intracranial large vessel occlusion is identified. 2. Intracranial atherosclerotic disease, as described. Most notably, there is moderate atherosclerotic narrowing of the distal petrous/proximal cavernous left ICA. Electronically Signed   By: Kellie Simmering D.O.   On: 07/13/2022 17:22   CT Head Wo Contrast  Result Date: 07/13/2022 CLINICAL DATA:  Altered mental status.  Possible fall. EXAM: CT HEAD WITHOUT CONTRAST CT CERVICAL SPINE WITHOUT CONTRAST TECHNIQUE: Multidetector CT imaging of the head and cervical spine was performed following the standard protocol without intravenous contrast. Multiplanar CT image reconstructions of the cervical spine were also generated. RADIATION DOSE REDUCTION: This exam was performed according to the departmental dose-optimization program which includes automated exposure control, adjustment of the mA and/or kV according to patient size and/or use of iterative reconstruction technique. COMPARISON:  MRI brain dated August 09, 2021. CT head and cervical spine dated May 18, 2021. FINDINGS:  CT HEAD FINDINGS Brain: No evidence of acute infarction, hemorrhage, hydrocephalus,  extra-axial collection or mass lesion/mass effect. 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 Stable mild atrophy and chronic microvascular ischemic changes. Vascular: Atherosclerotic vascular calcification of the carotid siphons. No hyperdense vessel. Skull: Normal. Negative for fracture or focal lesion. Sinuses/Orbits: No acute finding. Other: None. CT CERVICAL SPINE FINDINGS Alignment: No traumatic malalignment. Chronic straightening of the normal cervical lordosis. Skull base and vertebrae: No acute fracture. No primary bone lesion or focal pathologic process. Soft tissues and spinal canal: No prevertebral fluid or swelling. No visible canal hematoma. Disc levels: Degenerative interbody ankylosis at C5-C6 again noted. Unchanged moderate disc height loss at C3-C4. Similar moderate left neuroforaminal stenosis at C3-C4 and C5-C6 due to uncovertebral hypertrophy. Upper chest: Negative. Other: None. IMPRESSION: 1. No acute intracranial abnormality. Old right parietal infarct. 2. No acute cervical spine fracture or traumatic listhesis. Electronically Signed   By: Titus Dubin M.D.   On: 07/13/2022 15:01   CT Cervical Spine Wo Contrast  Result Date: 07/13/2022 CLINICAL DATA:  Altered mental status.  Possible fall. EXAM: CT HEAD WITHOUT CONTRAST CT CERVICAL SPINE WITHOUT CONTRAST TECHNIQUE: Multidetector CT imaging of the head and cervical spine was performed following the standard protocol without intravenous contrast. Multiplanar CT image reconstructions of the cervical spine were also generated. RADIATION DOSE REDUCTION: This exam was performed according to the departmental dose-optimization program which includes automated exposure control, adjustment of the mA and/or kV according to patient size and/or use of iterative reconstruction technique. COMPARISON:  MRI brain dated August 09, 2021. CT head and cervical spine dated May 18, 2021. FINDINGS: CT HEAD FINDINGS Brain: No evidence of acute infarction,  hemorrhage, hydrocephalus, extra-axial collection or mass lesion/mass effect. 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 Stable mild atrophy and chronic microvascular ischemic changes. Vascular: Atherosclerotic vascular calcification of the carotid siphons. No hyperdense vessel. Skull: Normal. Negative for fracture or focal lesion. Sinuses/Orbits: No acute finding. Other: None. CT CERVICAL SPINE FINDINGS Alignment: No traumatic malalignment. Chronic straightening of the normal cervical lordosis. Skull base and vertebrae: No acute fracture. No primary bone lesion or focal pathologic process. Soft tissues and spinal canal: No prevertebral fluid or swelling. No visible canal hematoma. Disc levels: Degenerative interbody ankylosis at C5-C6 again noted. Unchanged moderate disc height loss at C3-C4. Similar moderate left neuroforaminal stenosis at C3-C4 and C5-C6 due to uncovertebral hypertrophy. Upper chest: Negative. Other: None. IMPRESSION: 1. No acute intracranial abnormality. Old right parietal infarct. 2. No acute cervical spine fracture or traumatic listhesis. Electronically Signed   By: Titus Dubin M.D.   On: 07/13/2022 15:01   DG Chest Portable 1 View  Result Date: 07/13/2022 CLINICAL DATA:  Altered mental status, shaking EXAM: PORTABLE CHEST 1 VIEW COMPARISON:  Portable exam 1419 hours without priors for comparison FINDINGS: Enlargement of cardiac silhouette. Mediastinal contours and pulmonary vascularity normal. Lungs clear. No acute infiltrate, pleural effusion, or pneumothorax. Atherosclerotic calcification aorta. Scattered endplate spur formation thoracic spine. IMPRESSION: Enlargement of cardiac silhouette without acute infiltrate. Aortic Atherosclerosis (ICD10-I70.0). Electronically Signed   By: Lavonia Dana M.D.   On: 07/13/2022 14:50    Scheduled Meds:  chlorhexidine gluconate (MEDLINE KIT)  15 mL Mouth Rinse BID   Chlorhexidine Gluconate Cloth  6 each Topical Daily   digoxin  0.25 mg Oral  Daily   ezetimibe  10 mg Oral Daily   insulin aspart  0-6 Units Subcutaneous Q4H   levothyroxine  100 mcg Oral Q0600   nadolol  20  mg Oral Daily   pantoprazole (PROTONIX) IV  40 mg Intravenous Q12H   sodium chloride flush  3 mL Intravenous Q12H   Continuous Infusions:  sodium chloride Stopped (07/14/22 0829)   levETIRAcetam 1,000 mg (07/15/22 0023)   thiamine (VITAMIN B1) injection 500 mg (07/15/22 0822)     LOS: 2 days    Time spent: 35 mins    Jamieon Lannen, MD [facility]   If 7PM-7AM, please contact night-coverage

## 2022-07-16 DIAGNOSIS — R400 Somnolence: Secondary | ICD-10-CM | POA: Diagnosis not present

## 2022-07-16 LAB — GLUCOSE, CAPILLARY
Glucose-Capillary: 148 mg/dL — ABNORMAL HIGH (ref 70–99)
Glucose-Capillary: 141 mg/dL — ABNORMAL HIGH (ref 70–99)
Glucose-Capillary: 232 mg/dL — ABNORMAL HIGH (ref 70–99)

## 2022-07-16 LAB — LEVETIRACETAM LEVEL: Levetiracetam Lvl: 7.1 ug/mL — ABNORMAL LOW (ref 10.0–40.0)

## 2022-07-16 MED ORDER — LEVETIRACETAM 1000 MG PO TABS: 1000.0000 mg | ORAL_TABLET | Freq: Two times a day (BID) | ORAL | 1 refills | Status: DC

## 2022-07-16 MED ORDER — CEPHALEXIN 500 MG PO CAPS: 500.0000 mg | ORAL_CAPSULE | Freq: Two times a day (BID) | ORAL | 0 refills | Status: AC

## 2022-07-16 NOTE — Discharge Summary (Signed)
Physician Discharge Summary  Lawrence Osborn [id number] DOB: 1951-02-27 DOA: 07/13/2022  PCP: Rusty Aus, MD  Admit date: 07/13/2022  Discharge date: 07/16/2022  Admitted From: Home.  Disposition:  Home Health Services  Recommendations for Outpatient Follow-up:  Follow up with PCP in 1-2 weeks. Please obtain BMP/CBC in one week. Advised to follow-up with Neurology as scheduled. Advised to take Keppra 1000 mg twice daily for seizure disorder. Advised to continue lactulose and other medications for liver cirrhosis. Advised to take Keflex 500 mg twice daily for groin cellulitis  Home Health: Home PT/OT Equipment/Devices:None  Discharge Condition: Stable CODE STATUS:Full code Diet recommendation: Heart Healthy   Brief [facility] Course: This 71 years old male with PMH significant for liver cirrhosis, COPD, diabetes melitis, DVT, atrial fibrillation, history of GI bleed, hypothyroidism, hypertension, lupus presented in the ED for shaking,  thought to be from the seizures.  History is obtained from the chart,  Patient was found down on the floor with altered mentation. Blood sugar was found to be 200 per EMS, wife reports patient started twitching and she was worried about seizures.  Stroke code was called on patient's arrival.  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  Neurologist recommended EEG which shows no evidence of seizures.  Patient was started on Keppra and given Ativan for suspected possible seizures. Patient was admitted for acute metabolic encephalopathy likely due to hepatic encephalopathy in the setting of liver cirrhosis.  Ammonia level was found to be elevated.  Patient was given lactulose,  Patient had two bowel movements and then he has significantly improved and back to baseline.  Neurologist evaluation completed,  recommended patient should continue Keppra 1000 mg twice  daily, and outpatient follow-up with neurology.  Patient was continued on all other medications.  Patient feels better , wants to be discharged.  Home health services arranged.  Patient was prescribed Keflex 500 mg twice daily for 7 days for groin cellulitis.  Patient being discharged home.   He was managed for below problems.  Discharge Diagnoses:  Principal Problem:   AMS (altered mental status) Active Problems:   Acute hepatic encephalopathy (HCC)   Seizure disorder (HCC)   PAF (paroxysmal atrial fibrillation) (HCC)   Prolonged QT interval   CAD (coronary artery disease)   COPD (chronic obstructive pulmonary disease) (HCC)   Hypothyroidism   Thrombocytopenia (HCC)   Gastroesophageal reflux disease without esophagitis   Type 2 diabetes mellitus with peripheral neuropathy (HCC)   Acute deep vein thrombosis (DVT) of proximal vein of right lower extremity (HCC)  Acute metabolic encephalopathy:> Improved. Likely due to hepatic encephalopathy in the setting of liver cirrhosis, less likely seizures. Neurology was consulted for stroke.  Imaging no large vessel occlusion identified. Ammonia level was elevated, Patient was given lactulose enema in the ED. EEG: No evidence of seizures.  Mental status has improved. Continue neurochecks every 4 hours Continue seizure precautions, aspiration precautions and fall precautions. Continue CIWA protocol.  Continue lactulose. Patient seems back to his baseline mental status.   Seizure disorder: Continue seizure precautions. S/p EEG: No evidence of seizures. Continue Keppra twice daily. Avoid medications that lower seizure threshold, like tramadol/Levaquin. Neurology recommended to continue Keppra, close outpatient follow-up with neurology.   Paroxysmal atrial fibrillation: Continue digoxin, nadolol.  Heart rate reasonably controlled. He is not on anticoagulation likely due to the history of GI bleed.   Coronary artery disease: Stable.  Patient has normal stress test in 06/2021. Echo: LVEF 55 to 65%.  Continue home medications.   COPD: Stable, not in any acute exacerbation. Continue supplemental oxygen and keep saturation above 93%.   Hypothyroidism: Continue levothyroxine 100 mcg daily   Thrombocytopenia: Avoid anticoagulation, antiplatelet therapy. Likely secondary to portal hypertension and liver cirrhosis   GERD: Continue pantoprazole therapy.   Type 2 diabetes: Hold p.o. diabetic medications Continue sliding scale.   History of DVT: Patient is not on anticoagulation could be due to the history of GI bleed.  Discharge Instructions  Discharge Instructions     Call MD for:  difficulty breathing, headache or visual disturbances   Complete by: As directed    Call MD for:  persistant dizziness or light-headedness   Complete by: As directed    Call MD for:  persistant nausea and vomiting   Complete by: As directed    Diet - low sodium heart healthy   Complete by: As directed    Diet Carb Modified   Complete by: As directed    Discharge instructions   Complete by: As directed    Advised to follow-up with primary care physician in 1 week. Advised to follow-up with neurology as scheduled. Advised to take Keppra 1000 mg twice daily for seizure disorder. Advised to continue lactulose and other medications for liver cirrhosis.   Increase activity slowly   Complete by: As directed       Allergies as of 07/16/2022   No Known Allergies      Medication List     STOP taking these medications    levETIRAcetam 500 MG 24 hr tablet Commonly known as: KEPPRA XR Replaced by: levETIRAcetam 1000 MG tablet   polyethylene glycol 17 g packet Commonly known as: MIRALAX / GLYCOLAX   traMADol 50 MG tablet Commonly known as: ULTRAM       TAKE these medications    albuterol 108 (90 Base) MCG/ACT inhaler Commonly known as: VENTOLIN HFA Inhale 2 puffs into the lungs every 6 (six) hours as needed for  wheezing or shortness of breath.   B-12 2500 MCG Tabs Take 2,500 mcg by mouth daily.   cephALEXin 500 MG capsule Commonly known as: KEFLEX Take 1 capsule (500 mg total) by mouth 2 (two) times daily for 7 days.   digoxin 0.25 MG tablet Commonly known as: LANOXIN Take 1 tablet (0.25 mg total) by mouth daily.   docusate sodium 100 MG capsule Commonly known as: COLACE Take 100 mg by mouth 2 (two) times daily.   ezetimibe 10 MG tablet Commonly known as: ZETIA Take 1 tablet (10 mg total) by mouth daily.   ferrous gluconate 324 MG tablet Commonly known as: FERGON Take 324 mg by mouth daily with breakfast.   furosemide 20 MG tablet Commonly known as: LASIX Take 2 tablets (40 mg total) by mouth 2 (two) times daily.   insulin glargine 100 UNIT/ML injection Commonly known as: LANTUS Inject 35 Units into the skin 2 (two) times daily.   lactulose 10 GM/15ML solution Commonly known as: CHRONULAC Take 20 g by mouth 2 (two) times daily.   levETIRAcetam 1000 MG tablet Commonly known as: Keppra Take 1 tablet (1,000 mg total) by mouth 2 (two) times daily. Replaces: levETIRAcetam 500 MG 24 hr tablet   levothyroxine 100 MCG tablet Commonly known as: SYNTHROID Take 100 mcg by mouth daily before breakfast.   midodrine 10 MG tablet Commonly known as: PROAMATINE Take 1 tablet (10 mg total) by mouth 3 (  three) times daily with meals. What changed: when to take this   nadolol 40 MG tablet Commonly known as: CORGARD Take 40 mg by mouth daily.   omega-3 fish oil 1000 MG Caps capsule Commonly known as: MAXEPA Take 1,000 mg by mouth 2 (two) times daily.   omeprazole 20 MG capsule Commonly known as: PRILOSEC Take 20 mg by mouth daily.   QUEtiapine 100 MG tablet Commonly known as: SEROQUEL Take 0.5 tablets (50 mg total) by mouth at bedtime. Take 100 mg in the morning and 200 mg at bedtime What changed:  how much to take additional instructions   spironolactone 50 MG  tablet Commonly known as: ALDACTONE Take 50 mg by mouth daily.   zinc sulfate 220 (50 Zn) MG capsule Take by mouth daily.        Follow-up Information     Rusty Aus, MD. Go in 1 week(s).   Specialty: Internal Medicine Why: Appointment on Monday, 07/23/2022 at 2:15pm. Contact information: [place] [address] [phone]                No Known Allergies  Consultations: Neurology   Procedures/Studies: MR BRAIN WO CONTRAST  Result Date: 07/13/2022 CLINICAL DATA:  Unresponsive, concern for seizure EXAM: MRI HEAD WITHOUT CONTRAST TECHNIQUE: Multiplanar, multiecho pulse sequences of the brain and surrounding structures were obtained without intravenous contrast. COMPARISON:  08/09/2021 FINDINGS: Brain: No restricted diffusion to suggest acute or subacute infarct. No acute hemorrhage, mass, mass effect, or midline shift. No hydrocephalus or extra-axial collection. 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 Confluent and scattered T2 hyperintense signal in the periventricular white matter, likely the sequela of moderate to severe chronic small vessel ischemic disease. Mildly advanced cerebral volume loss for age. Unchanged mild right hippocampal atrophy compared to the left. The hippocampi are symmetric in signal. No heterotopia or evidence of cortical dysgenesis. Vascular: Normal arterial flow voids. Skull and upper cervical spine: Normal marrow signal. Sinuses/Orbits: Minimal mucosal thickening in the left maxillary sinus. Otherwise clear paranasal sinuses. No acute finding in the orbits. Other: The mastoids are well aerated. IMPRESSION: No acute intracranial process. Redemonstrated mild right hippocampal atrophy, compared to the left, without signal abnormality. Electronically Signed   By: Merilyn Baba M.D.   On: 07/13/2022 21:45   EEG adult  Result Date:  07/13/2022 Lora Havens, MD     07/13/2022  6:40 PM Patient Name: Lawrence Osborn MRN: [id number] Epilepsy Attending: Lora Havens Referring Physician/Provider: Lorenza Chick, MD Date: 07/13/2022 Duration: 28.24 mins Patient history: 71yo M with hepatic cirrhosis now with ams and twitching. EEG to evaluate for seizure. Level of alertness: lethargic AEDs during EEG study: Ativan Technical aspects: This EEG study was done with scalp electrodes positioned according to the 10-20 International system of electrode placement. Electrical activity was reviewed with band pass filter of 1-70Hz , sensitivity of 7 uV/mm, display speed of 50mm/sec with a 60Hz  notched filter applied as appropriate. EEG data were recorded continuously and digitally stored.  Video monitoring was available and reviewed as appropriate. Description: EEG showed continuous generalized 2.5-3.5 Hz delta slowing. Generalized periodic discharges ( GPD) with triphasic morphology at  1-2Hz  were also noted. IV ativan 2mg  was administered after which frequency of GPDs improved to .1Hz .  Hyperventilation and photic stimulation were not performed.   ABNORMALITY - Periodic discharges with triphasic morphology, generalized ( GPDs) - Continuous slow, generalized IMPRESSION: This study showed generalized  periodic discharges ( GPD) with triphasic morphology at 1-2Hz  with improvement in frequency after IV ativan but no significant change in pattern and clinical status. Given h/o hepatic cirrhosis, this EEG pattern is most likely due to toxic-metabolic causes like hyperammonemia. Additionally, there is severe diffuse encephalopathy. No seizures or definite epileptiform discharges were seen throughout the recording.  If suspicion for ictal-interictal activity remains a concern, a prolonged study can be considered. Priyanka Barbra Sarks   CT ANGIO HEAD NECK W WO CM  Result Date: 07/13/2022 CLINICAL DATA:  Provided history: Neuro deficit, acute, stroke suspected.  EXAM: CT ANGIOGRAPHY HEAD AND NECK TECHNIQUE: Multidetector CT imaging of the head and neck was performed using the standard protocol during bolus administration of intravenous contrast. Multiplanar CT image reconstructions and MIPs were obtained to evaluate the vascular anatomy. Carotid stenosis measurements (when applicable) are obtained utilizing NASCET criteria, using the distal internal carotid diameter as the denominator. RADIATION DOSE REDUCTION: This exam was performed according to the departmental dose-optimization program which includes automated exposure control, adjustment of the mA and/or kV according to patient size and/or use of iterative reconstruction technique. CONTRAST:  133mL OMNIPAQUE IOHEXOL 350 MG/ML SOLN COMPARISON:  Noncontrast head CT performed earlier today 07/13/2022. Cervical spine CT performed earlier today 07/13/2022. Carotid artery duplex 05/18/2021. FINDINGS: CTA NECK FINDINGS Aortic arch: Standard aortic branching. Atherosclerotic plaque within the visualized aortic arch and proximal major branch vessels of the neck. Streak and beam hardening artifact arising from a dense left-sided contrast bolus partially obscures the left subclavian artery. Within this limitation, there is no appreciable hemodynamically significant innominate or proximal subclavian artery stenosis. Right carotid system: CCA and ICA patent within the neck without stenosis. Atherosclerotic plaque about the carotid bifurcation and within the proximal ICA. Left carotid system: CCA and ICA patent within the neck. Nonstenotic atherosclerotic plaque at the CCA origin. Atherosclerotic plaque about the left carotid bifurcation and within the proximal left ICA, resulting in less than 50% stenosis of the proximal ICA. Vertebral arteries: The right vertebral artery is patent within the neck. Nonstenotic atherosclerotic plaque at the origin of this vessel. The left vertebral artery is occluded throughout the majority of the  neck. There is some reconstitution of enhancement within this vessel beginning at the V3 segment. Skeleton: Cervical spondylosis. No acute fracture or aggressive osseous lesion. Other neck: No neck mass or cervical lymphadenopathy. Upper chest: No consolidation within the imaged lung apices. Minimal paraseptal emphysema. Review of the MIP images confirms the above findings CTA HEAD FINDINGS Anterior circulation: The intracranial internal carotid arteries are patent. Atherosclerotic plaque within both vessels. Moderate stenosis within the distal petrous/proximal cavernous left ICA. No more than mild stenosis elsewhere within the intracranial internal carotid arteries. The M1 middle cerebral arteries are patent. Atherosclerotic irregularity of the M2 and more distal MCA vessels, bilaterally. No M2 proximal branch occlusion or high-grade proximal stenosis is identified. The anterior cerebral arteries are patent. No intracranial aneurysm is identified. Posterior circulation: The intracranial vertebral arteries are patent. The basilar artery is patent. The posterior cerebral arteries are patent. Atherosclerotic irregularity of both vessels without high-grade proximal stenosis. Hypoplastic P1 segments with sizable posterior communicating arteries, bilaterally. Venous sinuses: Within the limitations of contrast timing, no convincing thrombus. Anatomic variants: None significant. Review of the MIP images confirms the above findings Exam findings discussed with Dr. Curly Shores by telephone at 4:50 p.m. on 07/13/2022. IMPRESSION: CTA neck: 1. 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 There is some reconstitution of  enhancement within this vessel beginning at the V3 segment level. Notably, retrograde flow was described within the cervical left vertebral artery on the prior carotid artery duplex of 05/18/2021. The right vertebral artery is patent within the neck. Nonstenotic atherosclerotic plaque at  the origin of this vessel. 2. The common carotid and internal carotid arteries are patent within the neck without hemodynamically significant stenosis. Atherosclerotic plaque, bilaterally. 3.  Aortic Atherosclerosis (ICD10-I70.0). CTA head: 1. No intracranial large vessel occlusion is identified. 2. Intracranial atherosclerotic disease, as described. Most notably, there is moderate atherosclerotic narrowing of the distal petrous/proximal cavernous left ICA. Electronically Signed   By: Kellie Simmering D.O.   On: 07/13/2022 17:22   CT Head Wo Contrast  Result Date: 07/13/2022 CLINICAL DATA:  Altered mental status.  Possible fall. EXAM: CT HEAD WITHOUT CONTRAST CT CERVICAL SPINE WITHOUT CONTRAST TECHNIQUE: Multidetector CT imaging of the head and cervical spine was performed following the standard protocol without intravenous contrast. Multiplanar CT image reconstructions of the cervical spine were also generated. RADIATION DOSE REDUCTION: This exam was performed according to the departmental dose-optimization program which includes automated exposure control, adjustment of the mA and/or kV according to patient size and/or use of iterative reconstruction technique. COMPARISON:  MRI brain dated August 09, 2021. CT head and cervical spine dated May 18, 2021. FINDINGS: CT HEAD FINDINGS Brain: No evidence of acute infarction, hemorrhage, hydrocephalus, extra-axial collection or mass lesion/mass effect. 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 Stable mild atrophy and chronic microvascular ischemic changes. Vascular: Atherosclerotic vascular calcification of the carotid siphons. No hyperdense vessel. Skull: Normal. Negative for fracture or focal lesion. Sinuses/Orbits: No acute finding. Other: None. CT CERVICAL SPINE FINDINGS Alignment: No traumatic malalignment. Chronic straightening of the normal cervical lordosis. Skull base and vertebrae: No acute fracture. No primary bone lesion or focal pathologic process. Soft  tissues and spinal canal: No prevertebral fluid or swelling. No visible canal hematoma. Disc levels: Degenerative interbody ankylosis at C5-C6 again noted. Unchanged moderate disc height loss at C3-C4. Similar moderate left neuroforaminal stenosis at C3-C4 and C5-C6 due to uncovertebral hypertrophy. Upper chest: Negative. Other: None. IMPRESSION: 1. No acute intracranial abnormality. Old right parietal infarct. 2. No acute cervical spine fracture or traumatic listhesis. Electronically Signed   By: Titus Dubin M.D.   On: 07/13/2022 15:01   CT Cervical Spine Wo Contrast  Result Date: 07/13/2022 CLINICAL DATA:  Altered mental status.  Possible fall. EXAM: CT HEAD WITHOUT CONTRAST CT CERVICAL SPINE WITHOUT CONTRAST TECHNIQUE: Multidetector CT imaging of the head and cervical spine was performed following the standard protocol without intravenous contrast. Multiplanar CT image reconstructions of the cervical spine were also generated. RADIATION DOSE REDUCTION: This exam was performed according to the departmental dose-optimization program which includes automated exposure control, adjustment of the mA and/or kV according to patient size and/or use of iterative reconstruction technique. COMPARISON:  MRI brain dated August 09, 2021. CT head and cervical spine dated May 18, 2021. FINDINGS: CT HEAD FINDINGS Brain: No evidence of acute infarction, hemorrhage, hydrocephalus, extra-axial collection or mass lesion/mass effect. 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 Stable mild atrophy and chronic microvascular ischemic changes. Vascular: Atherosclerotic vascular calcification of the carotid siphons. No hyperdense vessel. Skull: Normal. Negative for fracture or focal lesion. Sinuses/Orbits: No acute finding. Other: None. CT CERVICAL SPINE FINDINGS Alignment: No traumatic malalignment. Chronic straightening of the normal cervical lordosis. Skull base and vertebrae: No acute fracture. No primary bone lesion or  focal pathologic process. Soft tissues and spinal canal:  No prevertebral fluid or swelling. No visible canal hematoma. Disc levels: Degenerative interbody ankylosis at C5-C6 again noted. Unchanged moderate disc height loss at C3-C4. Similar moderate left neuroforaminal stenosis at C3-C4 and C5-C6 due to uncovertebral hypertrophy. Upper chest: Negative. Other: None. IMPRESSION: 1. No acute intracranial abnormality. Old right parietal infarct. 2. No acute cervical spine fracture or traumatic listhesis. Electronically Signed   By: Titus Dubin M.D.   On: 07/13/2022 15:01   DG Chest Portable 1 View  Result Date: 07/13/2022 CLINICAL DATA:  Altered mental status, shaking EXAM: PORTABLE CHEST 1 VIEW COMPARISON:  Portable exam 1419 hours without priors for comparison FINDINGS: Enlargement of cardiac silhouette. Mediastinal contours and pulmonary vascularity normal. Lungs clear. No acute infiltrate, pleural effusion, or pneumothorax. Atherosclerotic calcification aorta. Scattered endplate spur formation thoracic spine. IMPRESSION: Enlargement of cardiac silhouette without acute infiltrate. Aortic Atherosclerosis (ICD10-I70.0). Electronically Signed   By: Lavonia Dana M.D.   On: 07/13/2022 14:50     Subjective: Patient was seen and examined at bedside.  Overnight events noted.   Patient doing much better.  Patient has been ambulating in the room.  Patient is being discharged home.  Discharge Exam: Vitals:   07/16/22 1146 07/16/22 1148  BP: 98/65 104/68  Pulse: (!) 108 (!) 112  Resp: 18 18  Temp: 98.1 F (36.7 C) 97.7 F (36.5 C)  SpO2:  97%   Vitals:   07/16/22 0536 07/16/22 0752 07/16/22 1146 07/16/22 1148  BP:  123/71 98/65 104/68  Pulse:  93 (!) 108 (!) 112  Resp:  18 18 18   Temp:  97.7 F (36.5 C) 98.1 F (36.7 C) 97.7 F (36.5 C)  TempSrc:      SpO2:  97%  97%  Weight: 98.8 kg     Height:        General: Pt is alert, awake, not in acute distress Cardiovascular: RRR, S1/S2 +, no  rubs, no gallops Respiratory: CTA bilaterally, no wheezing, no rhonchi Abdominal: Soft, NT, ND, bowel sounds + Extremities: no edema, no cyanosis    The results of significant diagnostics from this hospitalization (including imaging, microbiology, ancillary and laboratory) are listed below for reference.     Microbiology: No results found for this or any previous visit (from the past 240 hour(s)).   Labs: BNP (last 3 results) Recent Labs    07/13/22 1405  BNP [id number]   Basic Metabolic Panel: Recent Labs  Lab 07/13/22 1405 07/13/22 1552 07/14/22 0825  NA 138  --  137  K 5.2*  --  4.4  CL 104  --  105  CO2 25  --  25  GLUCOSE 181*  --  164*  BUN 15  --  11  CREATININE 0.81  --  0.63  CALCIUM 9.2  --  8.7*  MG  --  1.7 1.9  PHOS  --   --  3.0   Liver Function Tests: Recent Labs  Lab 07/13/22 1405 07/14/22 0825  AST 50* 43*  ALT 22 19  ALKPHOS 132* 112  BILITOT 3.4* 4.5*  PROT 7.3 6.9  ALBUMIN 3.3* 3.3*   No results for input(s): "LIPASE", "AMYLASE" in the last 168 hours. Recent Labs  Lab 07/13/22 1408  AMMONIA 79*   CBC: Recent Labs  Lab 07/13/22 1405 07/14/22 0825  WBC 4.0 4.1  NEUTROABS 2.9  --   HGB 14.7 14.4  HCT 42.0 42.5  MCV 90.1 93.0  PLT 50* 48*   Cardiac Enzymes: Recent Labs  Lab 07/13/22  2102  CKTOTAL 73   BNP: Invalid input(s): "POCBNP" CBG: Recent Labs  Lab 07/15/22 2028 07/15/22 2350 07/16/22 0501 07/16/22 0749 07/16/22 1141  GLUCAP 232* 187* 148* 141* 232*   D-Dimer No results for input(s): "DDIMER" in the last 72 hours. Hgb A1c Recent Labs    07/13/22 2102  HGBA1C 8.4*   Lipid Profile No results for input(s): "CHOL", "HDL", "LDLCALC", "TRIG", "CHOLHDL", "LDLDIRECT" in the last 72 hours. Thyroid function studies No results for input(s): "TSH", "T4TOTAL", "T3FREE", "THYROIDAB" in the last 72 hours.  Invalid input(s): "FREET3" Anemia work up No results for input(s): "VITAMINB12", "FOLATE", "FERRITIN", "TIBC",  "IRON", "RETICCTPCT" in the last 72 hours. Urinalysis    Component Value Date/Time   COLORURINE YELLOW (A) 07/13/2022 1405   APPEARANCEUR CLEAR (A) 07/13/2022 1405   APPEARANCEUR Cloudy (A) 08/26/2020 1334   LABSPEC 1.021 07/13/2022 1405   PHURINE 6.0 07/13/2022 1405   GLUCOSEU 150 (A) 07/13/2022 1405   HGBUR NEGATIVE 07/13/2022 1405   BILIRUBINUR NEGATIVE 07/13/2022 1405   BILIRUBINUR Negative 08/26/2020 1334   KETONESUR 5 (A) 07/13/2022 1405   PROTEINUR NEGATIVE 07/13/2022 1405   NITRITE NEGATIVE 07/13/2022 1405   LEUKOCYTESUR NEGATIVE 07/13/2022 1405   Sepsis Labs Recent Labs  Lab 07/13/22 1405 07/14/22 0825  WBC 4.0 4.1   Microbiology No results found for this or any previous visit (from the past 240 hour(s)).   Time coordinating discharge: Over 30 minutes  SIGNED:   Shawna Clamp, MD  [facility] 07/16/2022, 1:53 PM Pager   If 7PM-7AM, please contact night-coverage

## 2022-07-16 NOTE — TOC Transition Note (Signed)
Transition of Care [facility]) - CM/SW Discharge Note   Patient Details  Name: Lawrence Osborn MRN: [id number] Date of Birth: December 09, 1950  Transition of Care [facility]) CM/SW Contact:  Candie Chroman, LCSW Phone Number: 07/16/2022, 10:44 AM   Clinical Narrative:  Patient has orders to discharge home today. CSW met with patient. No supports at bedside. CSW introduced role and explained that therapy recommendations would be discussed. Patient declined home health. He will contact his PCP if he changes his mind after discharge. Patient reports having a BSC, RW, and cane at home. CSW offered to call his spouse but after further discussion, he will talk with her. Patient reports his wife does not feel he is ready for discharge yet but he feels ready to discharge today. Patient said his wife will pick him up, he is waiting to call her until he is closer to being ready. SDOH flag for food insecurity. Patient reports this is incorrect. His wife goes to the grocery store every day Monday-Friday. They are pescatarians. No further concerns. CSW signing off.  Final next level of care: Home/Self Care Barriers to Discharge: No Barriers Identified   Patient Goals and CMS Choice        Discharge Placement                Patient to be transferred to facility by: Wife   Patient and family notified of of transfer: 07/16/22  Discharge Plan and Services                                     Social Determinants of Health (SDOH) Interventions Food Insecurity Interventions: Inpatient TOC, Patient Refused (Patient reports no food insecurity concerns.)   Readmission Risk Interventions     No data to display

## 2022-07-16 NOTE — Care Management Important Message (Signed)
Important Message  Patient Details  Name: Lawrence Osborn MRN: [id number] Date of Birth: September 14, 1951   Medicare Important Message Given:  N/A - LOS <3 / Initial given by admissions     [name] 07/16/2022, 12:28 PM

## 2022-07-16 NOTE — Discharge Instructions (Signed)
Advised to follow-up with primary care physician in 1 week. Advised to follow-up with neurology as scheduled. Advised to take Keppra 1000 mg twice daily for seizure disorder. Advised to continue lactulose and other medications for liver cirrhosis.

## 2022-10-28 DIAGNOSIS — K7682 Hepatic encephalopathy: Secondary | ICD-10-CM

## 2022-11-26 ENCOUNTER — Other Ambulatory Visit: Payer: Self-pay | Admitting: Gastroenterology

## 2022-11-26 DIAGNOSIS — K7030 Alcoholic cirrhosis of liver without ascites: Secondary | ICD-10-CM

## 2022-11-30 ENCOUNTER — Ambulatory Visit: Payer: Medicare Other | Attending: Cardiovascular Disease | Admitting: Cardiovascular Disease

## 2022-11-30 ENCOUNTER — Encounter: Payer: Self-pay | Admitting: Cardiovascular Disease

## 2022-11-30 VITALS — BP 120/58 | HR 74 | Ht 68.0 in | Wt 226.5 lb

## 2022-11-30 DIAGNOSIS — I5022 Chronic systolic (congestive) heart failure: Secondary | ICD-10-CM | POA: Diagnosis not present

## 2022-11-30 DIAGNOSIS — I4892 Unspecified atrial flutter: Secondary | ICD-10-CM | POA: Diagnosis not present

## 2022-11-30 NOTE — Patient Instructions (Signed)
Medication Instructions:  No changes *If you need a refill on your cardiac medications before your next appointment, please call your pharmacy*   Lab Work: None ordered If you have labs (blood work) drawn today and your tests are completely normal, you will receive your results only by: MyChart Message (if you have MyChart) OR A paper copy in the mail If you have any lab test that is abnormal or we need to change your treatment, we will call you to review the results.   Testing/Procedures: None ordered   Follow-Up: At [facility], you and your health needs are our priority.  As part of our continuing mission to provide you with exceptional heart care, we have created designated Provider Care Teams.  These Care Teams include your primary Cardiologist (physician) and Advanced Practice Providers (APPs -  Physician Assistants and Nurse Practitioners) who all work together to provide you with the care you need, when you need it.  We recommend signing up for the patient portal called "MyChart".  Sign up information is provided on this After Visit Summary.  MyChart is used to connect with patients for Virtual Visits (Telemedicine).  Patients are able to view lab/test results, encounter notes, upcoming appointments, etc.  Non-urgent messages can be sent to your provider as well.   To learn more about what you can do with MyChart, go to [email or web address].    Your next appointment:   6 month(s)  Provider:   You may see Dr. Arida or one of the following Advanced Practice Providers on your designated Care Team:   Christopher Berge, NP Ryan Dunn, PA-C Cadence Furth, PA-C Sheri Hammock, NP    

## 2022-11-30 NOTE — Progress Notes (Signed)
Cardiology Office Note   Date:  11/30/2022   ID:  Lawrence Osborn, DOB 09-Jan-1951, MRN [id number]  PCP:  Rusty Aus, MD  Cardiologist:   Kathlyn Sacramento, MD   Chief Complaint  Patient presents with   Other    6 month f/u no complaints today. Meds reviewed verbally with pt.      History of Present Illness: [name] is a 72 y.o. male who presents for follow-up visit regarding atrial flutter/fibrillation.  He has known history of type 2 diabetes, previous bilateral pulmonary embolism, hypothyroidism, alcoholic liver cirrhosis and hyperlipidemia. He was diagnosed with atrial fibrillation in 2019 in the setting of alcohol intoxication.  Echocardiogram showed normal LV systolic function.  He was hospitalized in September 2022 due to recurrent syncopal episodes due to suspected orthostatic hypotension and tachycardia.  He was hypertensive on presentation and tachycardic.  CTA was negative for pulmonary embolism.  His syncope was felt to be due to hypotension in the setting of suboptimally controlled ventricular rate.  He was treated with rate control.  Digoxin was added.  Echocardiogram showed an ejection fraction of 45 to 50%, moderately dilated right atrium mildly reduced RV systolic function with normal pulmonary pressure. Subsequent Lexiscan Myoview done in October 2022 showed no evidence of ischemia with normal ejection fraction. He quit drinking in 2022. He was hospitalized at [facility] in December, 2022 after an abnormal EEG and possible seizure activity.  He was thought to have toxic metabolic encephalopathy in the setting of decompensated liver disease and noncompliance with lactulose.  Eliquis was discontinued due to platelet count of less than 50,000.  He was hospitalized in June at [facility] with toxic metabolic encephalopathy and possible seizures. Most recent hospitalization was in October at [facility] with metabolic encephalopathy and elevated ammonia.  He has been doing better overall and  noticed that his heart rate decreased over the last 6 weeks.  Is noted to be in sinus rhythm today.  No chest pain or shortness of breath.   Past Medical History:  Diagnosis Date   Anemia    Cirrhosis of liver (HCC)    COPD (chronic obstructive pulmonary disease) ([place])    Diabetes mellitus without complication ([place])    DVT (deep venous thrombosis) (HCC)    mid calf to groin   Dysrhythmia    atrial fibrillation   GI bleed    HLD (hyperlipidemia)    Hypertension    Hypothyroidism    Lupus (systemic lupus erythematosus) ([place])    Peritonitis ([place])    Pneumothorax    Syncope 08/30/2018   Thoracic radiculopathy 07/16/2016   Toe infection     Past Surgical History:  Procedure Laterality Date   APPENDECTOMY     COLONOSCOPY     DORSAL SLIT N/A 09/20/2020   Procedure: DORSAL SLIT;  Surgeon: Abbie Sons, MD;  Location: [facility] ORS;  Service: Urology;  Laterality: N/A;   ESOPHAGOGASTRODUODENOSCOPY (EGD) WITH PROPOFOL N/A 01/31/2022   Procedure: ESOPHAGOGASTRODUODENOSCOPY (EGD) WITH PROPOFOL;  Surgeon: Toledo, Benay Pike, MD;  Location: [facility] ENDOSCOPY;  Service: Gastroenterology;  Laterality: N/A;  IDDM   HERNIA REPAIR       Current Outpatient Medications  Medication Sig Dispense Refill   albuterol (PROVENTIL HFA;VENTOLIN HFA) 108 (90 Base) MCG/ACT inhaler Inhale 2 puffs into the lungs every 6 (six) hours as needed for wheezing or shortness of breath.     Cyanocobalamin (B-12) 2500 MCG TABS Take 2,500 mcg by mouth daily.     digoxin (LANOXIN) 0.25  MG tablet Take 1 tablet (0.25 mg total) by mouth daily. 90 tablet 1   docusate sodium (COLACE) 100 MG capsule Take 100 mg by mouth 2 (two) times daily.     ezetimibe (ZETIA) 10 MG tablet Take 1 tablet (10 mg total) by mouth daily. 90 tablet 3   ferrous gluconate (FERGON) 324 MG tablet Take 324 mg by mouth daily with breakfast.     furosemide (LASIX) 20 MG tablet Take 2 tablets (40 mg total) by mouth 2 (two) times daily. 60 tablet 0   insulin  glargine (LANTUS) 100 UNIT/ML injection Inject 35 Units into the skin 2 (two) times daily.     lactulose (CHRONULAC) 10 GM/15ML solution Take 20 g by mouth 2 (two) times daily.     levETIRAcetam (KEPPRA) 1000 MG tablet Take 1 tablet (1,000 mg total) by mouth 2 (two) times daily. 60 tablet 1   levothyroxine (SYNTHROID, LEVOTHROID) 100 MCG tablet Take 100 mcg by mouth daily before breakfast.     midodrine (PROAMATINE) 10 MG tablet Take 1 tablet (10 mg total) by mouth 3 (three) times daily with meals. (Patient taking differently: Take 10 mg by mouth in the morning and at bedtime.) 30 tablet 0   nadolol (CORGARD) 40 MG tablet Take 40 mg by mouth daily.     omega-3 fish oil (MAXEPA) 1000 MG CAPS capsule Take 1,000 mg by mouth 2 (two) times daily.     omeprazole (PRILOSEC) 20 MG capsule Take 20 mg by mouth daily.     QUEtiapine (SEROQUEL) 100 MG tablet Take 0.5 tablets (50 mg total) by mouth at bedtime. Take 100 mg in the morning and 200 mg at bedtime (Patient taking differently: Take 200 mg by mouth at bedtime.)     spironolactone (ALDACTONE) 50 MG tablet Take 50 mg by mouth daily.     No current facility-administered medications for this visit.    Allergies:   Patient has no known allergies.    Social History:  The patient  reports that he has quit smoking. His smoking use included cigarettes. He has never used smokeless tobacco. He reports that he does not currently use alcohol after a past usage of about 3.0 standard drinks of alcohol per week. He reports that he does not currently use drugs.   Family History:  The patient's family history includes CAD in his father; Hypothyroidism in his mother.    ROS:  Please see the history of present illness.   Otherwise, review of systems are positive for none.   All other systems are reviewed and negative.    PHYSICAL EXAM: VS:  BP (!) 120/58 (BP Location: Left Arm, Patient Position: Sitting, Cuff Size: Normal)   Pulse 74   Ht 5\' 8"  (1.727 m)   Wt  226 lb 8 oz (102.7 kg)   SpO2 97%   BMI 34.44 kg/m  , BMI Body mass index is 34.44 kg/m. GEN: Well nourished, well developed, in no acute distress  HEENT: normal  Neck: no JVD, carotid bruits, or masses Cardiac: Irregularly irregular and tachycardic; no murmurs, rubs, or gallops, mild bilateral leg edema Respiratory:  clear to auscultation bilaterally, normal work of breathing GI: soft, nontender, nondistended, + BS MS: no deformity or atrophy  Skin: warm and dry, no rash Neuro:  Strength and sensation are intact Psych: euthymic mood, full affect   EKG:  EKG is ordered today. The ekg ordered today demonstrates normal sinus rhythm with right bundle branch block and left anterior fascicular block.  Recent Labs: 07/13/2022: B Natriuretic Peptide 95.6 07/14/2022: ALT 19; BUN 11; Creatinine, Ser 0.63; Hemoglobin 14.4; Magnesium 1.9; Platelets 48; Potassium 4.4; Sodium 137    Lipid Panel No results found for: "CHOL", "TRIG", "HDL", "CHOLHDL", "VLDL", "LDLCALC", "LDLDIRECT"    Wt Readings from Last 3 Encounters:  11/30/22 226 lb 8 oz (102.7 kg)  07/16/22 217 lb 13 oz (98.8 kg)  05/29/22 200 lb (90.7 kg)           No data to display            ASSESSMENT AND PLAN:  1.  Atrial flutter: Surprisingly, he is noted to be in sinus rhythm today.  In spite of that, I am going to keep him on digoxin given the high chance of recurrent atrial arrhythmia and rapid ventricular response.  He is on nadolol for both A-fib and liver cirrhosis. Due to liver cirrhosis and thrombocytopenia, he is not a good candidate for anticoagulation.  2.  Chronic systolic heart failure with mildly reduced ejection fraction: He appears to be euvolemic on spironolactone 50 mg daily and furosemide 40 mg twice daily.  3.  History of syncope: Possibly due to seizures in the setting of toxic encephalopathy but he did have an episode that was highly suggestive of orthostatic hypotension.  4.  Liver  cirrhosis: Stable at the present time.    Disposition:   FU in 6 months.  Signed,  Kathlyn Sacramento, MD  11/30/2022 3:50 PM    [place]

## 2022-12-11 ENCOUNTER — Ambulatory Visit
Admission: RE | Admit: 2022-12-11 | Discharge: 2022-12-11 | Disposition: A | Discharge status: Home / Self Care | Payer: Medicare Other | Source: Ambulatory Visit | Attending: Gastroenterology | Admitting: Gastroenterology

## 2022-12-11 DIAGNOSIS — K7030 Alcoholic cirrhosis of liver without ascites: Secondary | ICD-10-CM | POA: Insufficient documentation

## 2022-12-26 ENCOUNTER — Other Ambulatory Visit: Payer: Self-pay | Admitting: Cardiovascular Disease

## 2023-05-15 IMAGING — MR MR HEAD W/O CM
13 series · 40 of 48 positions shown · non-contrast
Comparison: CT head 05/18/2021.

CLINICAL DATA: Seizure like activity and had 3-5 episodes in past
month.

EXAM:
MRI HEAD WITHOUT CONTRAST
TECHNIQUE: Multiplanar, multiecho pulse sequences of the brain and surrounding
structures were obtained without intravenous contrast.

[Series 5: ax dwi_tracew · axial · 3.0mm · 0.65mm/px · z∈[-102,+58]mm · 2 of 50 slices shown]
[im 1/50]
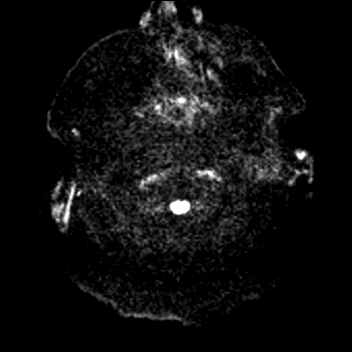
[im 50/50]
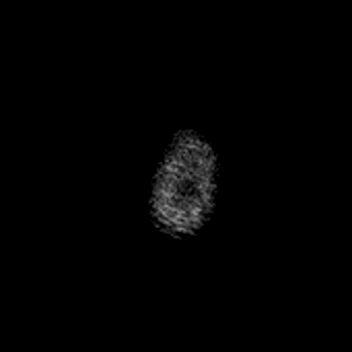

[Series 6: ax dwi_adc · axial · 3.0mm · 0.65mm/px · z∈[-102,+58]mm · 3 of 50 slices shown]
[im 1/50]
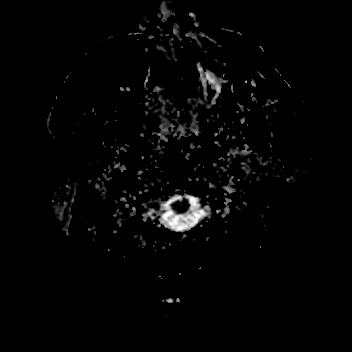
[im 25/50]
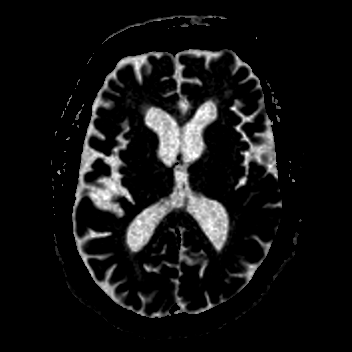
[im 50/50]
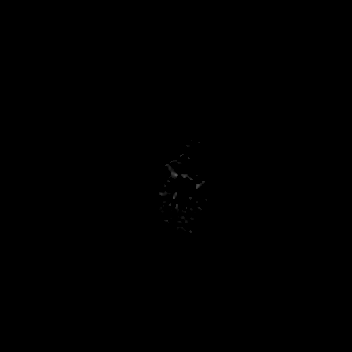

[Series 7: cor dwi_tracew · coronal · 5.0mm · 0.65mm/px · 3 of 40 slices shown]
[im 1/40]
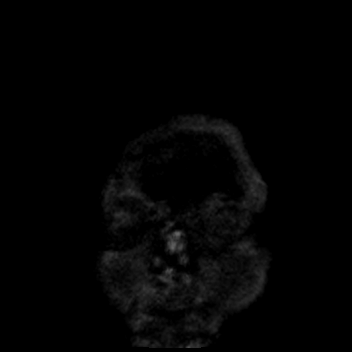
[im 20/40]
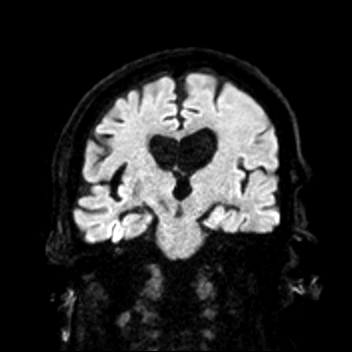
[im 40/40]
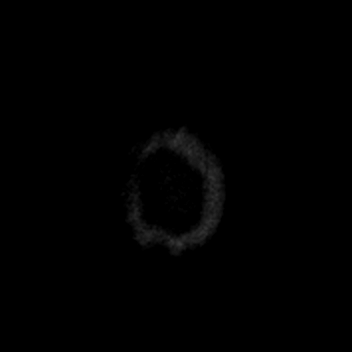

[Series 8: cor dwi_adc · coronal · 5.0mm · 0.65mm/px · 3 of 40 slices shown]
[im 1/40]
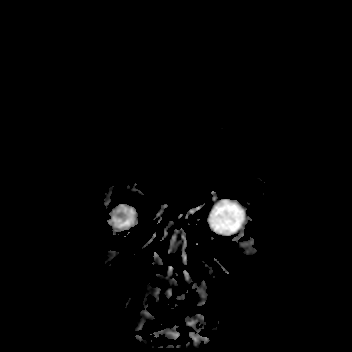
[im 20/40]
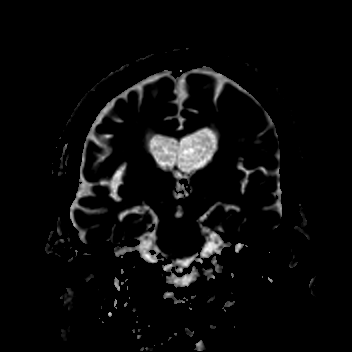
[im 40/40]
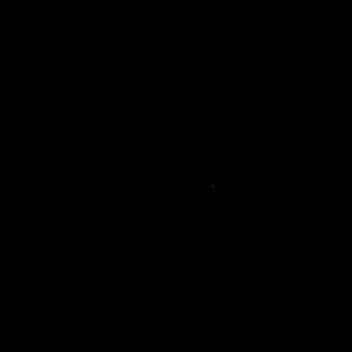

[Series 9: T1 · sagittal · 5.0mm · 0.62mm/px · 1 of 23 slices shown (1 of 2)]
[im 1/23]
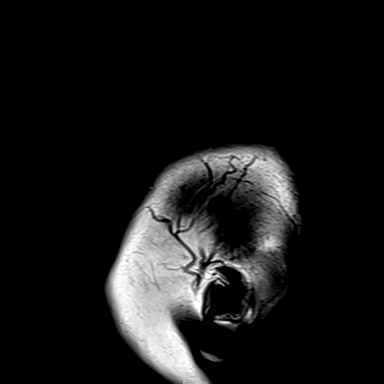

[Series 10: T2 · axial · 5.0mm · 0.53mm/px · z∈[-102,+52]mm · 2 of 27 slices shown (1 of 2)]
[im 1/27]
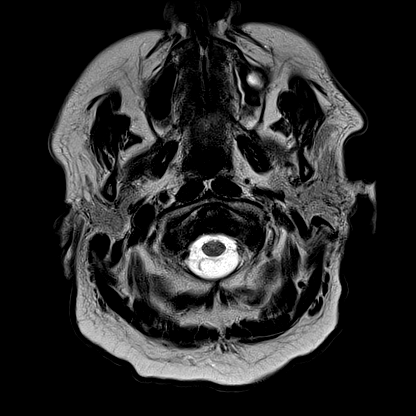
[im 27/27]
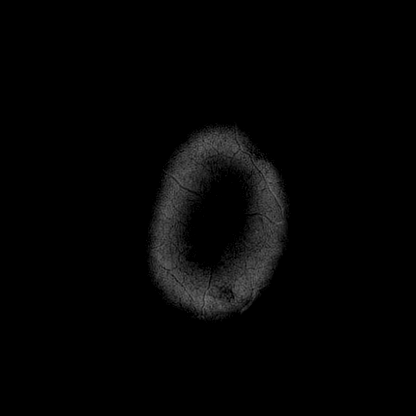

[Series 12: pha_images · axial · 3.0mm · 0.90mm/px · z∈[-107,+53]mm · 4 of 55 slices shown]
[im 1/55]
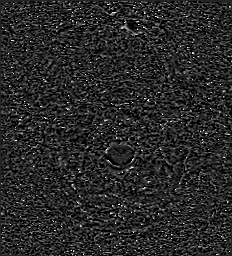
[im 19/55]
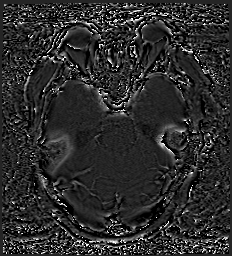
[im 37/55]
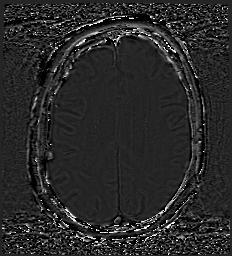
[im 55/55]
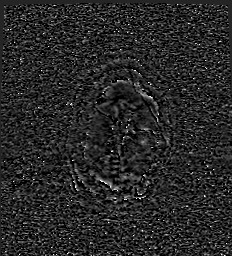

[Series 13: swi_images · axial · 3.0mm · 0.90mm/px · z∈[-107,+56]mm · 4 of 56 slices shown]
[im 1/56]
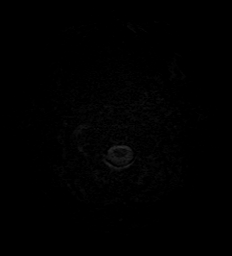
[im 19/56]
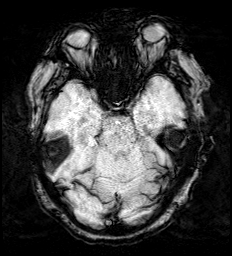
[im 37/56]
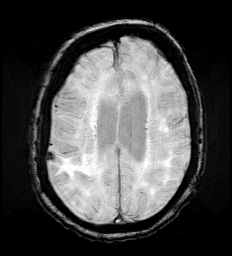
[im 56/56]
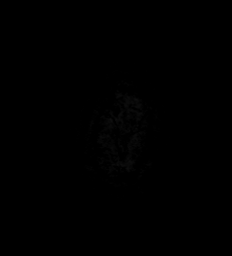

[Series 15: FLAIR · axial · 3.0mm · 0.53mm/px · z∈[-105,+55]mm · 4 of 55 slices shown (1 of 2)]
[im 1/55]
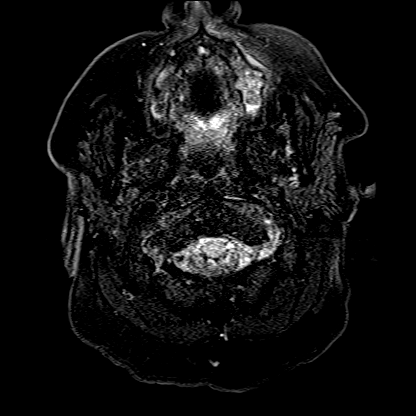
[im 19/55]
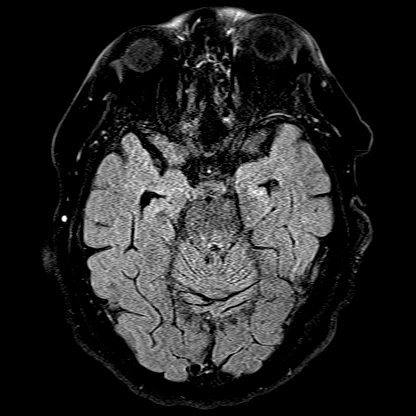
[im 37/55]
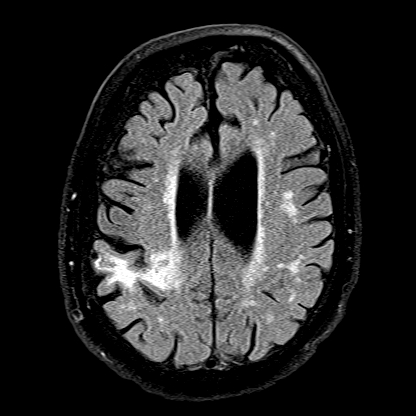
[im 55/55]
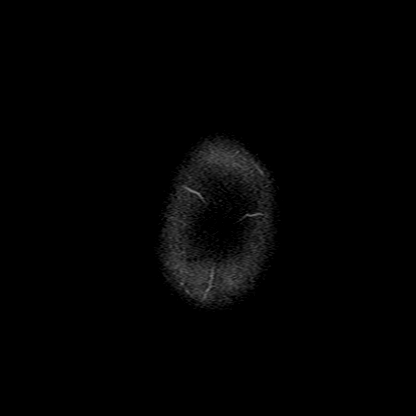

[Series 16: T1 · axial · 1.0mm · 0.98mm/px · z∈[-113,+59]mm · 8 of 176 slices shown (2 of 2)]
[im 1/176]
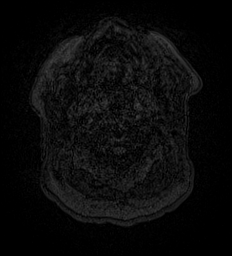
[im 36/176]
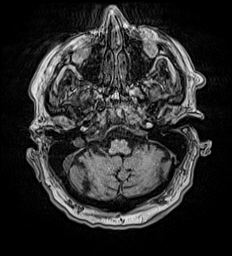
[im 53/176]
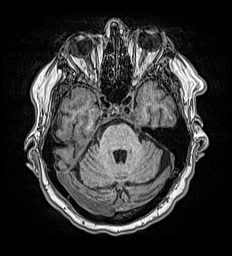
[im 71/176]
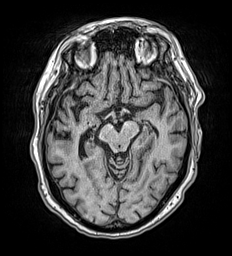
[im 106/176]
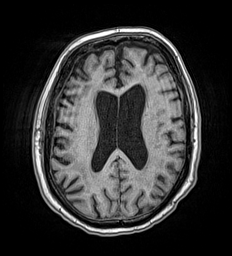
[im 123/176]
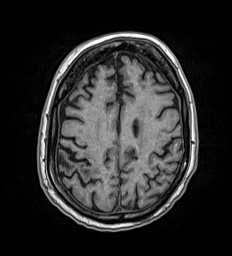
[im 141/176]
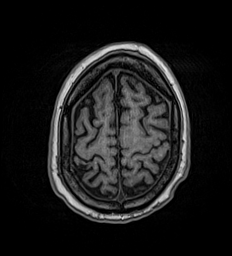
[im 176/176]
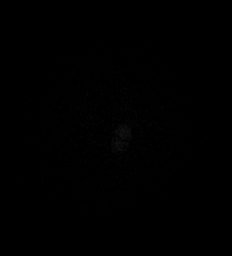

[Series 17: T2 · coronal · 3.0mm · 0.47mm/px · 2 of 35 slices shown (2 of 2)]
[im 1/35]
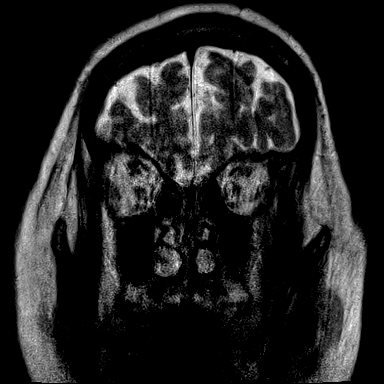
[im 35/35]
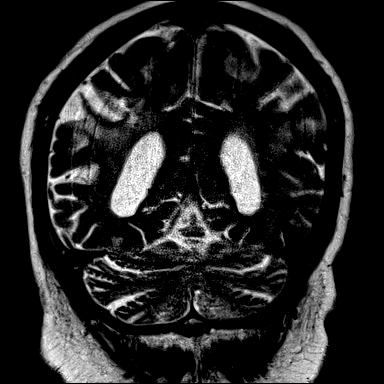

[Series 18: FLAIR · coronal · 3.0mm · 0.47mm/px · 2 of 35 slices shown (2 of 2)]
[im 1/35]
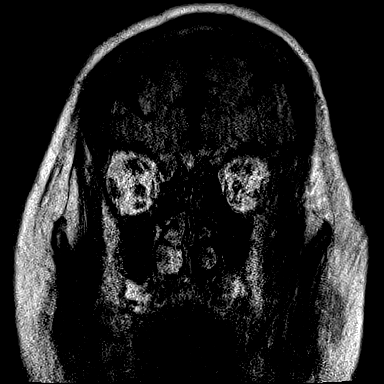
[im 35/35]
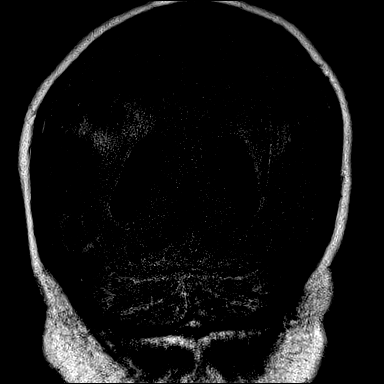

[Series 1026: cor thins rl · coronal · 3.0mm · 0.49mm/px · 2 of 107 slices shown]
[im 1/107]
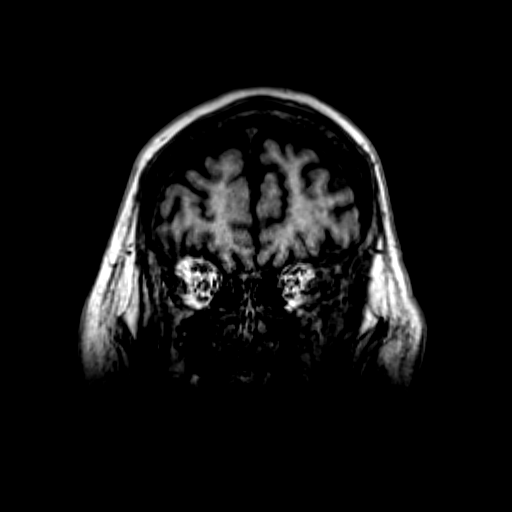
[im 18/107]
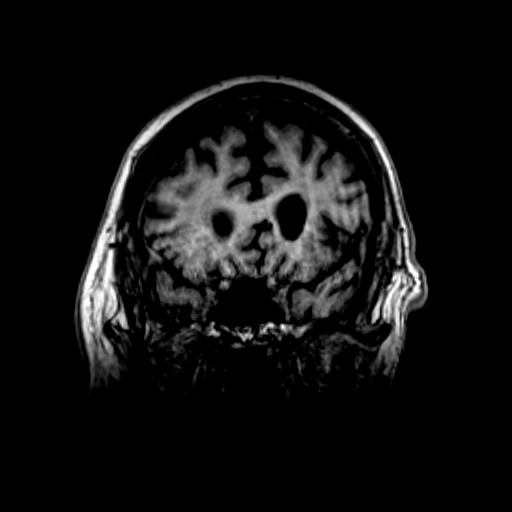

[40 of 48 positions shown; findings below may reference images not displayed]

FINDINGS: Brain: No restricted diffusion to suggest acute or subacute
infarction. No acute hemorrhage, mass, mass effect, or midline
shift. Encephalomalacia right parietal cortex, likely sequela of
remote infarct, with a focus of hemosiderin deposition that may be
related to associated hemorrhage or thrombus. No other significant
hemosiderin deposition to suggest remote hemorrhage. No cortical
cerebral edema to suggest recent seizure. Confluent T2 hyperintense
signal in the periventricular white matter, likely the sequela of
moderate to severe chronic small vessel ischemic disease. Mildly
advanced cerebral volume loss for age. No hydrocephalus or
extra-axial collection.

Mild right hippocampal atrophy compared to the left. No signal
abnormality. No heterotopia or evidence of cortical dysgenesis.

Vascular: Normal flow voids.

Skull and upper cervical spine: Normal marrow signal.

Sinuses/Orbits: Mild mucosal thickening in the maxillary sinuses
with a left maxillary mucous retention cyst. The orbits are
unremarkable.

Other: Trace fluid in left greater than right mastoid tip.
IMPRESSION: 1. No acute intracranial process. Sequela of remote right parietal
infarct and chronic microvascular disease, with mildly advanced
volume loss for age.

2. No evidence of recent seizure. Mild right hippocampal atrophy
compared to the left, without signal abnormality.

## 2023-06-24 ENCOUNTER — Other Ambulatory Visit: Payer: Self-pay | Admitting: Cardiovascular Disease

## 2023-06-24 NOTE — Telephone Encounter (Signed)
Please contact pt for future appointment. Pt due for 6 month f/u. 

## 2023-09-27 ENCOUNTER — Other Ambulatory Visit: Payer: Self-pay | Admitting: Cardiovascular Disease

## 2023-09-30 NOTE — Telephone Encounter (Signed)
 Last office visit: 11/30/22 with plan to f/u 6 months. next office visit: none/active recall  Please schedule f/u appt.  Thanks!

## 2023-10-10 ENCOUNTER — Emergency Department: Payer: Medicare Other

## 2023-10-10 ENCOUNTER — Encounter: Payer: Self-pay | Admitting: Radiology

## 2023-10-10 ENCOUNTER — Other Ambulatory Visit: Payer: Self-pay

## 2023-10-10 ENCOUNTER — Inpatient Hospital Stay
Admission: EM | Admit: 2023-10-10 | Discharge: 2023-10-12 | DRG: 442 | Disposition: A | Discharge status: Home Health | Payer: Medicare Other | Attending: Internal Medicine | Admitting: Internal Medicine

## 2023-10-10 DIAGNOSIS — Z8249 Family history of ischemic heart disease and other diseases of the circulatory system: Secondary | ICD-10-CM

## 2023-10-10 DIAGNOSIS — Z79899 Other long term (current) drug therapy: Secondary | ICD-10-CM | POA: Diagnosis not present

## 2023-10-10 DIAGNOSIS — Z8349 Family history of other endocrine, nutritional and metabolic diseases: Secondary | ICD-10-CM

## 2023-10-10 DIAGNOSIS — Z794 Long term (current) use of insulin: Secondary | ICD-10-CM | POA: Diagnosis not present

## 2023-10-10 DIAGNOSIS — I2510 Atherosclerotic heart disease of native coronary artery without angina pectoris: Secondary | ICD-10-CM | POA: Diagnosis present

## 2023-10-10 DIAGNOSIS — Z79890 Hormone replacement therapy (postmenopausal): Secondary | ICD-10-CM | POA: Diagnosis not present

## 2023-10-10 DIAGNOSIS — G40909 Epilepsy, unspecified, not intractable, without status epilepticus: Secondary | ICD-10-CM | POA: Diagnosis present

## 2023-10-10 DIAGNOSIS — M329 Systemic lupus erythematosus, unspecified: Secondary | ICD-10-CM | POA: Diagnosis present

## 2023-10-10 DIAGNOSIS — E039 Hypothyroidism, unspecified: Secondary | ICD-10-CM | POA: Diagnosis present

## 2023-10-10 DIAGNOSIS — J449 Chronic obstructive pulmonary disease, unspecified: Secondary | ICD-10-CM | POA: Diagnosis present

## 2023-10-10 DIAGNOSIS — F1011 Alcohol abuse, in remission: Secondary | ICD-10-CM | POA: Diagnosis present

## 2023-10-10 DIAGNOSIS — Z86718 Personal history of other venous thrombosis and embolism: Secondary | ICD-10-CM | POA: Diagnosis not present

## 2023-10-10 DIAGNOSIS — R32 Unspecified urinary incontinence: Secondary | ICD-10-CM | POA: Diagnosis present

## 2023-10-10 DIAGNOSIS — E669 Obesity, unspecified: Secondary | ICD-10-CM | POA: Diagnosis present

## 2023-10-10 DIAGNOSIS — Z8719 Personal history of other diseases of the digestive system: Secondary | ICD-10-CM | POA: Diagnosis not present

## 2023-10-10 DIAGNOSIS — D61818 Other pancytopenia: Secondary | ICD-10-CM | POA: Diagnosis present

## 2023-10-10 DIAGNOSIS — Z6832 Body mass index (BMI) 32.0-32.9, adult: Secondary | ICD-10-CM | POA: Diagnosis not present

## 2023-10-10 DIAGNOSIS — Z87891 Personal history of nicotine dependence: Secondary | ICD-10-CM | POA: Diagnosis not present

## 2023-10-10 DIAGNOSIS — R569 Unspecified convulsions: Secondary | ICD-10-CM | POA: Diagnosis not present

## 2023-10-10 DIAGNOSIS — E785 Hyperlipidemia, unspecified: Secondary | ICD-10-CM | POA: Diagnosis present

## 2023-10-10 DIAGNOSIS — I110 Hypertensive heart disease with heart failure: Secondary | ICD-10-CM | POA: Diagnosis present

## 2023-10-10 DIAGNOSIS — K7030 Alcoholic cirrhosis of liver without ascites: Secondary | ICD-10-CM | POA: Diagnosis present

## 2023-10-10 DIAGNOSIS — I700 Atherosclerosis of aorta: Secondary | ICD-10-CM | POA: Diagnosis present

## 2023-10-10 DIAGNOSIS — K7682 Hepatic encephalopathy: Principal | ICD-10-CM | POA: Diagnosis present

## 2023-10-10 DIAGNOSIS — I480 Paroxysmal atrial fibrillation: Secondary | ICD-10-CM | POA: Diagnosis present

## 2023-10-10 DIAGNOSIS — I5022 Chronic systolic (congestive) heart failure: Secondary | ICD-10-CM | POA: Diagnosis present

## 2023-10-10 DIAGNOSIS — E1142 Type 2 diabetes mellitus with diabetic polyneuropathy: Secondary | ICD-10-CM | POA: Diagnosis present

## 2023-10-10 LAB — CBG MONITORING, ED: Glucose-Capillary: 177 mg/dL — ABNORMAL HIGH (ref 70–99)

## 2023-10-10 LAB — CBC
HCT: 34.4 % — ABNORMAL LOW (ref 39.0–52.0)
Hemoglobin: 11.8 g/dL — ABNORMAL LOW (ref 13.0–17.0)
MCH: 31.3 pg (ref 26.0–34.0)
MCHC: 34.3 g/dL (ref 30.0–36.0)
MCV: 91.2 fL (ref 80.0–100.0)
Platelets: 37 10*3/uL — ABNORMAL LOW (ref 150–400)
RBC: 3.77 MIL/uL — ABNORMAL LOW (ref 4.22–5.81)
RDW: 14.2 % (ref 11.5–15.5)
WBC: 2.8 10*3/uL — ABNORMAL LOW (ref 4.0–10.5)
nRBC: 0 % (ref 0.0–0.2)

## 2023-10-10 LAB — DIFFERENTIAL
Abs Immature Granulocytes: 0.02 10*3/uL (ref 0.00–0.07)
Basophils Absolute: 0 10*3/uL (ref 0.0–0.1)
Basophils Relative: 0 %
Eosinophils Absolute: 0 10*3/uL (ref 0.0–0.5)
Eosinophils Relative: 1 %
Immature Granulocytes: 1 %
Lymphocytes Relative: 4 %
Lymphs Abs: 0.1 10*3/uL — ABNORMAL LOW (ref 0.7–4.0)
Monocytes Absolute: 0.2 10*3/uL (ref 0.1–1.0)
Monocytes Relative: 9 %
Neutro Abs: 2.4 10*3/uL (ref 1.7–7.7)
Neutrophils Relative %: 85 %
Smear Review: NORMAL

## 2023-10-10 LAB — COMPREHENSIVE METABOLIC PANEL
ALT: 20 U/L (ref 0–44)
AST: 44 U/L — ABNORMAL HIGH (ref 15–41)
Albumin: 3.1 g/dL — ABNORMAL LOW (ref 3.5–5.0)
Alkaline Phosphatase: 134 U/L — ABNORMAL HIGH (ref 38–126)
Anion gap: 14 (ref 5–15)
BUN: 23 mg/dL (ref 8–23)
CO2: 24 mmol/L (ref 22–32)
Calcium: 8.8 mg/dL — ABNORMAL LOW (ref 8.9–10.3)
Chloride: 101 mmol/L (ref 98–111)
Creatinine, Ser: 0.85 mg/dL (ref 0.61–1.24)
GFR, Estimated: >60 mL/min (ref >60)
Glucose, Bld: 217 mg/dL — ABNORMAL HIGH (ref 70–99)
Potassium: 4.4 mmol/L (ref 3.5–5.1)
Sodium: 139 mmol/L (ref 135–145)
Total Bilirubin: 2.7 mg/dL — ABNORMAL HIGH (ref 0.0–1.2)
Total Protein: 7 g/dL (ref 6.5–8.1)

## 2023-10-10 LAB — BRAIN NATRIURETIC PEPTIDE: B Natriuretic Peptide: 109.7 pg/mL — ABNORMAL HIGH (ref 0.0–100.0)

## 2023-10-10 LAB — AMMONIA: Ammonia: 130 umol/L — ABNORMAL HIGH (ref 9–35)

## 2023-10-10 LAB — PROTIME-INR
INR: 1.3 — ABNORMAL HIGH (ref 0.8–1.2)
Prothrombin Time: 16.6 s — ABNORMAL HIGH (ref 11.4–15.2)

## 2023-10-10 LAB — ETHANOL: Alcohol, Ethyl (B): <10 mg/dL (ref <10)

## 2023-10-10 LAB — APTT: aPTT: 28 s (ref 24–36)

## 2023-10-10 LAB — DIGOXIN LEVEL: Digoxin Level: 2 ng/mL (ref 0.8–2.0)

## 2023-10-10 MED ORDER — ONDANSETRON HCL 4 MG/2ML IJ SOLN: 4.0000 mg | Freq: Three times a day (TID) | INTRAMUSCULAR | Status: DC | PRN

## 2023-10-10 MED ORDER — LORAZEPAM 2 MG/ML IJ SOLN: 2.0000 mg | Freq: Once | INTRAMUSCULAR | Status: AC

## 2023-10-10 MED ORDER — METOPROLOL TARTRATE 5 MG/5ML IV SOLN: 5.0000 mg | INTRAVENOUS | Status: DC | PRN

## 2023-10-10 MED ORDER — IOHEXOL 350 MG/ML SOLN
100.0000 mL | Freq: Once | INTRAVENOUS | Status: AC | PRN
  Administered 2023-10-10: 100 mL via INTRAVENOUS

## 2023-10-10 MED ORDER — LORAZEPAM 2 MG/ML IJ SOLN: 2.0000 mg | INTRAMUSCULAR | Status: DC | PRN

## 2023-10-10 MED ORDER — LEVETIRACETAM IN NACL 500 MG/100ML IV SOLN
500.0000 mg | INTRAVENOUS | Status: AC
  Administered 2023-10-10: 500 mg via INTRAVENOUS
  Filled 2023-10-10: qty 100

## 2023-10-10 MED ORDER — SODIUM CHLORIDE 0.9% FLUSH
3.0000 mL | Freq: Once | INTRAVENOUS | Status: AC
  Administered 2023-10-10: 3 mL via INTRAVENOUS

## 2023-10-10 MED ORDER — INSULIN GLARGINE-YFGN 100 UNIT/ML ~~LOC~~ SOLN
25.0000 [IU] | Freq: Two times a day (BID) | SUBCUTANEOUS | Status: DC
  Administered 2023-10-11 – 2023-10-12 (×4): 25 [IU] via SUBCUTANEOUS
  Filled 2023-10-10 (×5): qty 0.25

## 2023-10-10 MED ORDER — HYDRALAZINE HCL 20 MG/ML IJ SOLN: 5.0000 mg | INTRAMUSCULAR | Status: DC | PRN

## 2023-10-10 MED ORDER — LACTULOSE ENEMA
300.0000 mL | Freq: Three times a day (TID) | ORAL | Status: DC
  Administered 2023-10-11 (×4): 300 mL via RECTAL
  Filled 2023-10-10 (×6): qty 300

## 2023-10-10 MED ORDER — DM-GUAIFENESIN ER 30-600 MG PO TB12: 1.0000 | ORAL_TABLET | Freq: Two times a day (BID) | ORAL | Status: DC | PRN

## 2023-10-10 MED ORDER — LACTULOSE ENEMA
300.0000 mL | Freq: Once | ORAL | Status: AC
  Administered 2023-10-10: 300 mL via RECTAL
  Filled 2023-10-10: qty 300

## 2023-10-10 MED ORDER — ALBUTEROL SULFATE (2.5 MG/3ML) 0.083% IN NEBU: 2.5000 mg | INHALATION_SOLUTION | RESPIRATORY_TRACT | Status: DC | PRN

## 2023-10-10 MED ORDER — LORAZEPAM 2 MG/ML IJ SOLN
INTRAMUSCULAR | Status: AC
  Administered 2023-10-10: 2 mg via INTRAVENOUS
  Filled 2023-10-10: qty 1

## 2023-10-10 MED ORDER — INSULIN ASPART 100 UNIT/ML IJ SOLN
0.0000 [IU] | Freq: Three times a day (TID) | INTRAMUSCULAR | Status: DC
  Administered 2023-10-11 (×2): 1 [IU] via SUBCUTANEOUS
  Administered 2023-10-12: 3 [IU] via SUBCUTANEOUS
  Filled 2023-10-10 (×3): qty 1

## 2023-10-10 MED ORDER — LEVOTHYROXINE SODIUM 100 MCG/5ML IV SOLN
50.0000 ug | Freq: Every day | INTRAVENOUS | Status: DC
  Administered 2023-10-11: 50 ug via INTRAVENOUS
  Filled 2023-10-10 (×2): qty 5

## 2023-10-10 MED ORDER — INSULIN ASPART 100 UNIT/ML IJ SOLN: 0.0000 [IU] | Freq: Every day | INTRAMUSCULAR | Status: DC

## 2023-10-10 MED ORDER — ACETAMINOPHEN 325 MG RE SUPP: 325.0000 mg | Freq: Four times a day (QID) | RECTAL | Status: DC | PRN

## 2023-10-10 MED ORDER — LEVETIRACETAM IN NACL 1500 MG/100ML IV SOLN
1500.0000 mg | Freq: Once | INTRAVENOUS | Status: AC
  Administered 2023-10-10: 1500 mg via INTRAVENOUS
  Filled 2023-10-10: qty 100

## 2023-10-10 MED ORDER — LEVETIRACETAM IN NACL 1000 MG/100ML IV SOLN
1000.0000 mg | Freq: Two times a day (BID) | INTRAVENOUS | Status: DC
  Administered 2023-10-11 – 2023-10-12 (×3): 1000 mg via INTRAVENOUS
  Filled 2023-10-10 (×3): qty 100

## 2023-10-10 NOTE — H&P (Addendum)
History and Physical    Lawrence Osborn:[id number] DOB: 08/25/1951 DOA: 10/10/2023  Referring MD/NP/PA:   PCP: Danella Penton, MD   Patient coming from:  The patient is coming from home.     Chief Complaint: AMS  HPI: Lawrence Osborn is a 73 y.o. 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  Per his wife (I called his wife by phone), patient was last known normal at about 7:30 PM yesterday.Pt went to bed at about 8 PM.  Since this morning, patient has been sleeping all day long. At about 5:30 PM, patient was noted to be very confused, not responsive, and had urinary incontinence. Pt could not be waken up per his wife. Patient has nausea and dry heaves, no vomiting or diarrhea.  Does not seem to have chest pain.  No cough or SOB. His wife states that patient is not taking lactulose currently. When I saw patient in ED, patient is unresponsive, not arousable,. He is in comatose status.  Patient moves both arms on painful stimuli, but not moving legs on painful stimuli.  Data reviewed independently and ED Course: pt was found to have ammonia 130, pancytopenia with WBC 2.6, hemoglobin 11.8, platelets 37 (patient had WBC 1.6, hemoglobin 11.7, platelets 3810/12/09), GFR> 60, INR 1.3, PTT 28, alcohol level less than 10, digoxin level 2.0.  Blood pressure 150/62, heart rate 82, RR 14, oxygen sat 96% on room air.  CT of head negative for acute intracranial abnormalities.  CTA of head and neck is negative for LVO.  Patient is admitted to PCU as inpatient.  Dr. Selina Cooley of neurology is consulted.  CTA of head and neck: 1. 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 2. No intracranial large vessel occlusion. Moderate stenosis in the proximal left cavernous ICA and mild stenosis in the right cavernous ICA. 3. No infarct core or penumbra identified on CT perfusion. 4. Aortic atherosclerosis.   Aortic Atherosclerosis (ICD10-I70.0).    EKG: I have personally reviewed.  Sinus rhythm, QTc 482, bifascicular block,   Review of Systems: Could not be reviewed due to altered mental status.  Allergy: No Known Allergies  Past Medical History:  Diagnosis Date   Anemia    Cirrhosis of liver (HCC)    COPD (chronic obstructive pulmonary disease) (HCC)    Diabetes mellitus without complication (HCC)    DVT (deep venous thrombosis) (HCC)    mid calf to groin   Dysrhythmia    atrial fibrillation   GI bleed    HLD (hyperlipidemia)    Hypertension    Hypothyroidism    Lupus (systemic lupus erythematosus) (HCC)    Peritonitis (HCC)    Pneumothorax    Syncope 08/30/2018   Thoracic radiculopathy 07/16/2016   Toe infection     Past Surgical History:  Procedure Laterality Date   APPENDECTOMY     COLONOSCOPY     DORSAL SLIT N/A 09/20/2020   Procedure: DORSAL SLIT;  Surgeon: Riki Altes, MD;  Location: [facility] ORS;  Service: Urology;  Laterality: N/A;   ESOPHAGOGASTRODUODENOSCOPY (EGD) WITH PROPOFOL N/A 01/31/2022  Procedure: ESOPHAGOGASTRODUODENOSCOPY (EGD) WITH PROPOFOL;  Surgeon: Toledo, Boykin Nearing, MD;  Location: [facility] ENDOSCOPY;  Service: Gastroenterology;  Laterality: N/A;  IDDM   HERNIA REPAIR      Social History:  reports that he has quit smoking. His smoking use included cigarettes. He has never used smokeless tobacco. He reports that he does not currently use alcohol after a past usage of about 3.0 standard drinks of alcohol per week. He reports that he does not currently use drugs.  Family History:  Family History  Problem  Relation Age of Onset   Hypothyroidism Mother    CAD Father      Prior to Admission medications   Medication Sig Start Date End Date Taking? Authorizing Provider  albuterol (PROVENTIL HFA;VENTOLIN HFA) 108 (90 Base) MCG/ACT inhaler Inhale 2 puffs into the lungs every 6 (six) hours as needed for wheezing or shortness of breath.    [provider]  Cyanocobalamin (B-12) 2500 MCG TABS Take 2,500 mcg by mouth daily.    [provider]  digoxin (LANOXIN) 0.25 MG tablet Take 1 tablet (0.25 mg total) by mouth daily. Due follow up visit.  PLEASE CALL OFFICE TO SCHEDULE APPOINTMENT PRIOR TO NEXT REFILL 09/30/23   Iran Ouch, MD  docusate sodium (COLACE) 100 MG capsule Take 100 mg by mouth 2 (two) times daily.    [provider]  ezetimibe (ZETIA) 10 MG tablet Take 1 tablet (10 mg total) by mouth daily. 06/29/21   Marisue Ivan D, PA-C  ferrous gluconate (FERGON) 324 MG tablet Take 324 mg by mouth daily with breakfast.    [provider]  furosemide (LASIX) 20 MG tablet Take 2 tablets (40 mg total) by mouth 2 (two) times daily. 05/23/21   Marrion Coy, MD  insulin glargine (LANTUS) 100 UNIT/ML injection Inject 35 Units into the skin 2 (two) times daily.    [provider]  lactulose (CHRONULAC) 10 GM/15ML solution Take 20 g by mouth 2 (two) times daily. 04/18/21   [provider]  levETIRAcetam (KEPPRA) 1000 MG tablet Take 1 tablet (1,000 mg total) by mouth 2 (two) times daily. 07/16/22   Willeen Niece, MD  levothyroxine (SYNTHROID, LEVOTHROID) 100 MCG tablet Take 100 mcg by mouth daily before breakfast.    [provider]  midodrine (PROAMATINE) 10 MG tablet Take 1 tablet (10 mg total) by mouth 3 (three) times daily with meals. Patient taking differently: Take 10 mg by mouth in the morning and at bedtime. 05/23/21   Marrion Coy, MD  nadolol (CORGARD) 40 MG tablet Take 40 mg by mouth daily. 05/02/22   [provider]  omega-3  fish oil (MAXEPA) 1000 MG CAPS capsule Take 1,000 mg by mouth 2 (two) times daily.    [provider]  omeprazole (PRILOSEC) 20 MG capsule Take 20 mg by mouth daily.    [provider]  QUEtiapine (SEROQUEL) 100 MG tablet Take 0.5 tablets (50 mg total) by mouth at bedtime. Take 100 mg in the morning and 200 mg at bedtime Patient taking differently: Take 200 mg by mouth at bedtime. 05/23/21   Marrion Coy, MD  spironolactone (ALDACTONE) 50 MG tablet Take 50 mg by mouth daily. 04/18/21   [provider]    Physical Exam: Vitals:   10/10/23 1901 10/10/23 2000 10/10/23 2030 10/10/23 2130  BP: (!) 150/62 (!) 132/57 138/63 (!) 127/56  Pulse: 82 74 77 80  Resp: 14 15 14 17   SpO2: 96% 95% 95% 94%  Weight: 96.2  kg      General: Not in acute distress HEENT:       Eyes: PERRL, EOMI, no jaundice       ENT: No discharge from the ears and nose       Neck: No JVD, no bruit, no mass felt. Heme: No neck lymph node enlargement. Cardiac: S1/S2, RRR, No murmurs, No gallops or rubs. Respiratory: No rales, wheezing, rhonchi or rubs. GI: Soft, nondistended, nontender, no organomegaly, BS present. GU: No hematuria Ext: No pitting leg edema bilaterally. 1+DP/PT pulse bilaterally. Musculoskeletal: No joint deformities, No joint redness or warmth, no limitation of ROM in spin. Skin: No rashes.  Neuro: Patient is unresponsive, in comatose status, not arousable, cranial nerves II-XII grossly intact, slightly moves both arms on painful stimuli, not moving legs.  Psych: Patient is not psychotic, no suicidal or hemocidal ideation.  Labs on Admission: I have personally reviewed following labs and imaging studies  CBC: Recent Labs  Lab 10/10/23 1821  WBC 2.8*  NEUTROABS 2.4  HGB 11.8*  HCT 34.4*  MCV 91.2  PLT 37*   Basic Metabolic Panel: Recent Labs  Lab 10/10/23 1821  NA 139  K 4.4  CL 101  CO2 24  GLUCOSE 217*  BUN 23  CREATININE 0.85  CALCIUM 8.8*   GFR: CrCl  cannot be calculated (Unknown ideal weight.). Liver Function Tests: Recent Labs  Lab 10/10/23 1821  AST 44*  ALT 20  ALKPHOS 134*  BILITOT 2.7*  PROT 7.0  ALBUMIN 3.1*   No results for input(s): "LIPASE", "AMYLASE" in the last 168 hours. Recent Labs  Lab 10/10/23 1818  AMMONIA 130*   Coagulation Profile: Recent Labs  Lab 10/10/23 1821  INR 1.3*   Cardiac Enzymes: No results for input(s): "CKTOTAL", "CKMB", "CKMBINDEX", "TROPONINI" in the last 168 hours. BNP (last 3 results) No results for input(s): "PROBNP" in the last 8760 hours. HbA1C: No results for input(s): "HGBA1C" in the last 72 hours. CBG: Recent Labs  Lab 10/10/23 2148  GLUCAP 177*   Lipid Profile: No results for input(s): "CHOL", "HDL", "LDLCALC", "TRIG", "CHOLHDL", "LDLDIRECT" in the last 72 hours. Thyroid Function Tests: No results for input(s): "TSH", "T4TOTAL", "FREET4", "T3FREE", "THYROIDAB" in the last 72 hours. Anemia Panel: No results for input(s): "VITAMINB12", "FOLATE", "FERRITIN", "TIBC", "IRON", "RETICCTPCT" in the last 72 hours. Urine analysis:    Component Value Date/Time   COLORURINE YELLOW (A) 07/13/2022 1405   APPEARANCEUR CLEAR (A) 07/13/2022 1405   APPEARANCEUR Cloudy (A) 08/26/2020 1334   LABSPEC 1.021 07/13/2022 1405   PHURINE 6.0 07/13/2022 1405   GLUCOSEU 150 (A) 07/13/2022 1405   HGBUR NEGATIVE 07/13/2022 1405   BILIRUBINUR NEGATIVE 07/13/2022 1405   BILIRUBINUR Negative 08/26/2020 1334   KETONESUR 5 (A) 07/13/2022 1405   PROTEINUR NEGATIVE 07/13/2022 1405   NITRITE NEGATIVE 07/13/2022 1405   LEUKOCYTESUR NEGATIVE 07/13/2022 1405   Sepsis Labs: @LABRCNTIP (procalcitonin:4,lacticidven:4) )No results found for this or any previous visit (from the past 240 hours).   Radiological Exams on Admission:   Assessment/Plan Principal Problem:   Hepatic encephalopathy (HCC) Active Problems:   Seizure disorder (HCC)   PAF (paroxysmal atrial fibrillation) (HCC)   HLD  (hyperlipidemia)   CAD (coronary artery disease)   COPD (chronic obstructive pulmonary disease) (HCC)   Hypothyroidism   Type 2 diabetes mellitus with peripheral neuropathy (HCC)   Chronic systolic CHF (congestive heart failure) (HCC)   Pancytopenia (HCC)   Obesity (BMI 30-39.9)   Assessment and Plan:    Hepatic encephalopathy (HCC):  Patient has altered mental status is likely due to hepatic encephalopathy.  Ammonia level 130, which is consistent with hepatic encephalopathy.  His wife reported that patient is currently not taking lactulose.  CT of the head negative for acute intracranial abnormalities.  Dr. Selina Cooley of neurology is consulted --> less likely to have stroke per Dr. Selina Cooley.  Recommend to get EEG and give home Keppra for seizure  -Admitted to PCU as inpatient. -Lactulose enema 3 times daily -Frequent neurocheck -Fall precaution -Keep patient n.p.o. and hold all oral medications until mental status improves  Seizure disorder (HCC):  -pt was given keppra load 2g in ED, will continue home dose 1g bid by IV. - EEG tmrw -Seizure precaution -As needed Ativan for seizure  PAF (paroxysmal atrial fibrillation) (HCC): Heart rate 82.  Patient's not taking anticoagulants due to history of GI bleeding. -Tele monitoring - hold nadolol and digoxin -As needed IV metoprolol 5 mg every 2 hours for heart rate> 125  HLD (hyperlipidemia) -hold Zetia until mental status improves  CAD (coronary artery disease) -Hold Zetia  COPD (chronic obstructive pulmonary disease) (HCC) -As needed albuterol nebulizer  Hypothyroidism -Switch oral Synthroid to IV, current dose from 100 to 50 mcg daily  Type 2 diabetes mellitus with peripheral neuropathy [facility]): Patient is taking 50 units Lantus twice daily.  Recent A1c 8.4, poorly controlled. -Sliding scale insulin -Lantus  Pancytopenia: Hemoglobin stable 11.8. -Follow-up by CBC  Chronic systolic CHF: 2D echo on 05/19/2022 showed EF of 45-50%.   Patient does not have leg edema JVD.  CHF is compensated. -Hold Lasix and spironolactone -Check  BNP  --> 109   Obesity (BMI 30-39.9): Body weight 96.2 kg, BMI 32.23 -When mental status improves, will need counseling about importance of losing weight, taking healthy diet and doing more exercise       DVT ppx: SCD  Code Status: Full code per his wife  Family Communication:   Yes, patient's wife at bed side.    Disposition Plan:  Anticipate discharge back to previous environment  Consults called:  Dr. Selina Cooley of neuro  Admission status and Level of care: Progressive:    as inpt        Dispo: The patient is from: Home              Anticipated d/c is to: Home              Anticipated d/c date is: 2 days              Patient currently is not medically stable to d/c.    Severity of Illness:  The appropriate patient status for this patient is INPATIENT. Inpatient status is judged to be reasonable and necessary in order to provide the required intensity of service to ensure the patient's safety. The patient's presenting symptoms, physical exam findings, and initial radiographic and laboratory data in the context of their chronic comorbidities is felt to place them at high risk for further clinical deterioration. Furthermore, it is not anticipated that the patient will be medically stable for discharge from the hospital within 2 midnights of admission.   * I certify that at the point of admission it is my clinical judgment that the patient will require inpatient hospital care spanning beyond 2 midnights from the point of admission due to high intensity of service, high risk for further deterioration and high frequency of surveillance required.*       Date of Service 10/10/2023    [facility]   If 7PM-7AM, please contact night-coverage [email or web address] 10/10/2023, 10:51 PM

## 2023-10-10 NOTE — ED Triage Notes (Signed)
Pt presents to the ED via [facility] from home for stroke like sx's. EMS was told by family that his last known well was 3pm. Upon Neurologist further conversation with family LKW changed to 1930 yesterday. Pt unresponsive upon arrival and only withdraws from painful stimuli on left leg. Pt has a hx of seizures and thrombocytopenia. Pt was previously taking anticoagulants, but had to stop due to GI bleed.

## 2023-10-10 NOTE — ED Notes (Signed)
Four attempts made to insert flexiseal to instill enema into rectum. Stool leaking each attempt. MD aware.

## 2023-10-10 NOTE — ED Provider Notes (Signed)
[facility] Provider Note    Event Date/Time   First MD Initiated Contact with Patient 10/10/23 1816     (approximate)   History   Chief Complaint Code Stroke   HPI  Lawrence Osborn is a 73 y.o. male with past medical history of diabetes, CAD, CHF, atrial fibrillation, DVT, COPD, seizures, alcohol abuse, and cirrhosis who presents to the ED for code stroke.  Per EMS, patient found vomiting by family this morning, but seemed alert and oriented at that time.  Family reported to EMS that he seemed his usual self around 3 PM, but they later found him around 530 to be minimally responsive.  EMS reports that he was unable to follow commands during transport, typically would not respond to questions but when he did he would have mumbled and incoherent speech.  EMS reports that there was no seizure activity witnessed by family.     Physical Exam   Triage Vital Signs: ED Triage Vitals  Encounter Vitals Group     BP      Systolic BP Percentile      Diastolic BP Percentile      Pulse      Resp      Temp      Temp src      SpO2      Weight      Height      Head Circumference      Peak Flow      Pain Score      Pain Loc      Pain Education      Exclude from Growth Chart     Most recent vital signs: Vitals:   10/10/23 1901 10/10/23 2000  BP: (!) 150/62 (!) 132/57  Pulse: 82 74  Resp: 14 15  SpO2: 96% 95%    Constitutional: Awake and alert. Eyes: Conjunctivae are normal.  Pupils equal, round, and reactive to light bilaterally. Head: Atraumatic. Nose: No congestion/rhinnorhea. Mouth/Throat: Mucous membranes are moist.  Cardiovascular: Normal rate, regular rhythm. Grossly normal heart sounds.  2+ radial pulses bilaterally. Respiratory: Normal respiratory effort.  No retractions. Lungs CTAB. Gastrointestinal: Soft and nontender. No distention. Musculoskeletal: No lower extremity tenderness nor edema.  Neurologic: No verbal response noted, unable to  follow commands.  Grimaces to pain in all 4 extremities.    ED Results / Procedures / Treatments   Labs (all labs ordered are listed, but only abnormal results are displayed) Labs Reviewed  PROTIME-INR - Abnormal; Notable for the following components:      Result Value   Prothrombin Time 16.6 (*)    INR 1.3 (*)    All other components within normal limits  CBC - Abnormal; Notable for the following components:   WBC 2.8 (*)    RBC 3.77 (*)    Hemoglobin 11.8 (*)    HCT 34.4 (*)    Platelets 37 (*)    All other components within normal limits  DIFFERENTIAL - Abnormal; Notable for the following components:   Lymphs Abs 0.1 (*)    All other components within normal limits  COMPREHENSIVE METABOLIC PANEL - Abnormal; Notable for the following components:   Glucose, Bld 217 (*)    Calcium 8.8 (*)    Albumin 3.1 (*)    AST 44 (*)    Alkaline Phosphatase 134 (*)    Total Bilirubin 2.7 (*)    All other components within normal limits  AMMONIA - Abnormal; Notable for the following  components:   Ammonia 130 (*)    All other components within normal limits  APTT  ETHANOL  DIGOXIN LEVEL  LEVETIRACETAM LEVEL  URINE DRUG SCREEN, QUALITATIVE ([facility] ONLY)  BRAIN NATRIURETIC PEPTIDE  HIV ANTIBODY (ROUTINE TESTING W REFLEX)  COMPREHENSIVE METABOLIC PANEL  CBC  I-STAT CREATININE, ED  CBG MONITORING, ED     EKG  ED ECG REPORT I, Chesley Noon, the attending physician, personally viewed and interpreted this ECG.   Date: 10/10/2023  EKG Time: 18:50  Rate: 85  Rhythm: normal sinus rhythm  Axis: LAD  Intervals:right bundle branch block and left anterior fascicular block  ST&T Change: None  RADIOLOGY CT head reviewed and interpreted by me with no hemorrhage or midline shift.  PROCEDURES:  Critical Care performed: Yes, see critical care procedure note(s)  Procedures   MEDICATIONS ORDERED IN ED: Medications  levETIRAcetam (KEPPRA) IVPB 1000 mg/100 mL premix (has no  administration in time range)  lactulose (CHRONULAC) enema 200 gm (has no administration in time range)  albuterol (PROVENTIL) (2.5 MG/3ML) 0.083% nebulizer solution 2.5 mg (has no administration in time range)  dextromethorphan-guaiFENesin (MUCINEX DM) 30-600 MG per 12 hr tablet 1 tablet (has no administration in time range)  ondansetron (ZOFRAN) injection 4 mg (has no administration in time range)  hydrALAZINE (APRESOLINE) injection 5 mg (has no administration in time range)  acetaminophen (TYLENOL) suppository 325 mg (has no administration in time range)  insulin aspart (novoLOG) injection 0-9 Units (has no administration in time range)  insulin aspart (novoLOG) injection 0-5 Units (has no administration in time range)  LORazepam (ATIVAN) injection 2 mg (has no administration in time range)  lactulose (CHRONULAC) enema 200 gm (has no administration in time range)  sodium chloride flush (NS) 0.9 % injection 3 mL (3 mLs Intravenous Given 10/10/23 1913)  LORazepam (ATIVAN) injection 2 mg (2 mg Intravenous Given 10/10/23 1844)  iohexol (OMNIPAQUE) 350 MG/ML injection 100 mL (100 mLs Intravenous Contrast Given 10/10/23 1840)  levETIRAcetam (KEPPRA) IVPB 1500 mg/ 100 mL premix (0 mg Intravenous Stopped 10/10/23 1929)  levETIRAcetam (KEPPRA) IVPB 500 mg/100 mL premix (0 mg Intravenous Stopped 10/10/23 1955)     IMPRESSION / MDM / ASSESSMENT AND PLAN / ED COURSE  I reviewed the triage vital signs and the nursing notes.                              73 y.o. male with past medical history of diabetes, CAD, CHF, atrial fibrillation, DVT, COPD, alcohol abuse, cirrhosis, and seizures who presents to the ED for altered mental status and possible stroke.  Patient's presentation is most consistent with acute presentation with potential threat to life or bodily function.  Differential diagnosis includes, but is not limited to, stroke, TIA, seizure, hepatic encephalopathy, anemia, electrolyte abnormality,  AKI, alcohol intoxication, UTI, hypoglycemia.  Patient arrives in no acute distress, appears awake and alert but does not seem to be able to follow commands and I am unable to elicit a verbal response.  He does seem to grimace and localize to painful stimuli, protecting airway without difficulty.  Code stroke activated prior to arrival, although Dr. Selina Cooley of neurology was able to speak with patient's wife over the phone, who reported his last known well time was last night.  CT head negative for acute process, patient not a candidate for TNK.  CT angio of head and neck negative for large vessel occlusion, stroke seems less likely at this  time.  Would consider seizure versus hepatic encephalopathy, patient given 2 mg of IV Ativan per neurology and we will also give loading dose of Keppra.  He has a history of seizures but it is unclear whether he has been taking his Keppra.  Ammonia level significantly elevated, I do not think patient would be able to take lactulose orally at this time and we will order rectal lactulose.  Additional labs with mild leukopenia and some anemia, but no evidence of bleeding at this time.  Patient with no significant AKI or electrolyte abnormality, bilirubin elevated similar to previous.  No seizure activity noted, but patient remains less responsive on reevaluation.  Case discussed with hospitalist for admission.      FINAL CLINICAL IMPRESSION(S) / ED DIAGNOSES   Final diagnoses:  Hepatic encephalopathy (HCC)     Rx / DC Orders   ED Discharge Orders     None        Note:  This document was prepared using Dragon voice recognition software and may include unintentional dictation errors.   Chesley Noon, MD 10/10/23 2025

## 2023-10-10 NOTE — Progress Notes (Addendum)
Code stroke cart activated at 1812. (There was a delay in registration).  Dr. Larinda Buttery at bedside.  Dr. Selina Cooley paged at [phone]. Pt to CT at 1815.  Dr. Selina Cooley on screen at 1826.  Ricci Barker, Tele Stroke RN

## 2023-10-10 NOTE — Consult Note (Signed)
NEUROLOGY TELESTROKE CONSULT NOTE   Date of service: October 10, 2023 Patient Name: Lawrence Osborn MRN:  [id number] DOB:  1950-12-31 Chief Complaint: AMS Requesting Provider: Chesley Noon, MD  Consult Participants: myself, patient, bedside RN, telestroke RN Location of the provider: [facility], [place] Location of the patient: [facility]  This consult was provided via telemedicine with 2-way video and audio communication. The patient/family was informed that care would be provided in this way and agreed to receive care in this manner.   History of Present Illness   This is a 73 yo man with hx cirrhosis of the liver with chronic anemia and thrombocytopenia c/b GI bleed, a fib and DVT not on anticoagulation 2/2 aforementioned contraindications, COPD, DM2, HTN, HL, lupus who presents with AMS. LKW 1930 yesterday before he went to bed per wife I spoke with by phone. This AM he did not get out of bed like usual and was dry heaving / retching but no vomit was coming out. He was lethargic and this worsened to the point he was not responding normally or following commands. Yesterday patient was feeling well at his usual baseline and went driving running errands. He has been admitted before for hepatic encephalopathy and also post-ictal confusion following seizures. He is prescribed keppra 1000mg  bid at home, compliance unknown.   Head CT no acute process. TNK not administered 2/2 presentation outside the window, hx GI bleed, and hx severe thrombocytopenia. CTA showed chronic L vert occlusion with no acute findings. CT perfusion showed no deficit. All CNS imaging personally reviewed  Modified rankin score: 2-Slight disability-UNABLE to perform all activities but does not need assistance  NIHSS components Score: Comment  1a Level of Conscious 0[]  1[]  2[x]  3[]      1b LOC Questions 0[]  1[]  2[x]       1c LOC Commands 0[]  1[]  2[x]       2 Best Gaze 0[x]  1[]  2[]       3 Visual 0[x]  1[]  2[]  3[]      4 Facial Palsy  0[x]  1[]  2[]  3[]      5a Motor Arm - left 0[]  1[]  2[]  3[]  4[x]  UN[]    5b Motor Arm - Right 0[]  1[]  2[]  3[]  4[x]  UN[]    6a Motor Leg - Left 0[]  1[]  2[]  3[x]  4[]  UN[]    6b Motor Leg - Right 0[]  1[]  2[]  3[]  4[x]  UN[]    7 Limb Ataxia 0[x]  1[]  2[]  3[]  UN[]     8 Sensory 0[]  1[x]  2[]  UN[]      9 Best Language 0[]  1[]  2[]  3[x]      10 Dysarthria 0[]  1[]  2[x]  UN[]      11 Extinct. and Inattention 0[]  1[x]  2[]       TOTAL:  27      ROS   Unable to ascertain due to AMS  Past History   Past Medical History:  Diagnosis Date   Anemia    Cirrhosis of liver (HCC)    COPD (chronic obstructive pulmonary disease) (HCC)    Diabetes mellitus without complication (HCC)    DVT (deep venous thrombosis) (HCC)    mid calf to groin   Dysrhythmia    atrial fibrillation   GI bleed    HLD (hyperlipidemia)    Hypertension    Hypothyroidism    Lupus (systemic lupus erythematosus) (HCC)    Peritonitis (HCC)    Pneumothorax    Syncope 08/30/2018   Thoracic radiculopathy 07/16/2016   Toe infection     Past Surgical History:  Procedure Laterality Date   APPENDECTOMY  COLONOSCOPY     DORSAL SLIT N/A 09/20/2020   Procedure: DORSAL SLIT;  Surgeon: Riki Altes, MD;  Location: [facility] ORS;  Service: Urology;  Laterality: N/A;   ESOPHAGOGASTRODUODENOSCOPY (EGD) WITH PROPOFOL N/A 01/31/2022   Procedure: ESOPHAGOGASTRODUODENOSCOPY (EGD) WITH PROPOFOL;  Surgeon: Toledo, Boykin Nearing, MD;  Location: [facility] ENDOSCOPY;  Service: Gastroenterology;  Laterality: N/A;  IDDM   HERNIA REPAIR      Family History: Family History  Problem Relation Age of Onset   Hypothyroidism Mother    CAD Father     Social History  reports that he has quit smoking. His smoking use included cigarettes. He has never used smokeless tobacco. He reports that he does not currently use alcohol after a past usage of about 3.0 standard drinks of alcohol per week. He reports that he does not currently use drugs.  No Known  Allergies  Medications   Current Facility-Administered Medications:    levETIRAcetam (KEPPRA) IVPB 1500 mg/ 100 mL premix, 1,500 mg, Intravenous, Once, Jefferson Fuel, MD   levETIRAcetam (KEPPRA) IVPB 500 mg/100 mL premix, 500 mg, Intravenous, NOW, Selina Cooley, Malachi Carl, MD   sodium chloride flush (NS) 0.9 % injection 3 mL, 3 mL, Intravenous, Once, Chesley Noon, MD  Current Outpatient Medications:    albuterol (PROVENTIL HFA;VENTOLIN HFA) 108 (90 Base) MCG/ACT inhaler, Inhale 2 puffs into the lungs every 6 (six) hours as needed for wheezing or shortness of breath., Disp: , Rfl:    Cyanocobalamin (B-12) 2500 MCG TABS, Take 2,500 mcg by mouth daily., Disp: , Rfl:    digoxin (LANOXIN) 0.25 MG tablet, Take 1 tablet (0.25 mg total) by mouth daily. Due follow up visit.  PLEASE CALL OFFICE TO SCHEDULE APPOINTMENT PRIOR TO NEXT REFILL, Disp: 30 tablet, Rfl: 1   docusate sodium (COLACE) 100 MG capsule, Take 100 mg by mouth 2 (two) times daily., Disp: , Rfl:    ezetimibe (ZETIA) 10 MG tablet, Take 1 tablet (10 mg total) by mouth daily., Disp: 90 tablet, Rfl: 3   ferrous gluconate (FERGON) 324 MG tablet, Take 324 mg by mouth daily with breakfast., Disp: , Rfl:    furosemide (LASIX) 20 MG tablet, Take 2 tablets (40 mg total) by mouth 2 (two) times daily., Disp: 60 tablet, Rfl: 0   insulin glargine (LANTUS) 100 UNIT/ML injection, Inject 35 Units into the skin 2 (two) times daily., Disp: , Rfl:    lactulose (CHRONULAC) 10 GM/15ML solution, Take 20 g by mouth 2 (two) times daily., Disp: , Rfl:    levETIRAcetam (KEPPRA) 1000 MG tablet, Take 1 tablet (1,000 mg total) by mouth 2 (two) times daily., Disp: 60 tablet, Rfl: 1   levothyroxine (SYNTHROID, LEVOTHROID) 100 MCG tablet, Take 100 mcg by mouth daily before breakfast., Disp: , Rfl:    midodrine (PROAMATINE) 10 MG tablet, Take 1 tablet (10 mg total) by mouth 3 (three) times daily with meals. (Patient taking differently: Take 10 mg by mouth in the morning and  at bedtime.), Disp: 30 tablet, Rfl: 0   nadolol (CORGARD) 40 MG tablet, Take 40 mg by mouth daily., Disp: , Rfl:    omega-3 fish oil (MAXEPA) 1000 MG CAPS capsule, Take 1,000 mg by mouth 2 (two) times daily., Disp: , Rfl:    omeprazole (PRILOSEC) 20 MG capsule, Take 20 mg by mouth daily., Disp: , Rfl:    QUEtiapine (SEROQUEL) 100 MG tablet, Take 0.5 tablets (50 mg total) by mouth at bedtime. Take 100 mg in the morning and 200 mg at  bedtime (Patient taking differently: Take 200 mg by mouth at bedtime.), Disp: , Rfl:    spironolactone (ALDACTONE) 50 MG tablet, Take 50 mg by mouth daily., Disp: , Rfl:   Vitals   Vitals:   Oct 13, 2023 1817 10-13-2023 1901  BP:  (!) 150/62  Pulse:  82  Resp:  14  SpO2: 98% 96%  Weight:  96.2 kg    Body mass index is 32.23 kg/m.  Physical Exam   Physical Exam Gen: obtunded, grimaces in response to pain only Resp: normal WOB CV: extremities appear well-perfused  Neurologic Examination   Neuro: *MS: obtunded, grimaces in response to pain only, does not follow commands *Speech: mute *CN: PERRL 3mm, roving eye movements without lateral preference, (+) corneals, face symmetric at rest *Motor & sensory:   Slight withdrawal in LLE to pain, grimaces but does not withdraw to noxious stimuli in other extremities *Coordination:  UTA *Reflexes:  UTA 2/2 tele-exam *Gait: deferred  Labs/Imaging/Neurodiagnostic studies   CBC:  Recent Labs  Lab 10-13-2023 1821  WBC 2.8*  NEUTROABS 2.4  HGB 11.8*  HCT 34.4*  MCV 91.2  PLT 37*   Basic Metabolic Panel:  Lab Results  Component Value Date   NA 139 10/13/23   K 4.4 10-13-2023   CO2 24 2023-10-13   GLUCOSE 217 (H) October 13, 2023   BUN 23 10/13/23   CREATININE 0.85 10-13-2023   CALCIUM 8.8 (L) 13-Oct-2023   GFRNONAA >60 13-Oct-2023   GFRAA >60 08/03/2019   Lipid Panel: No results found for: "LDLCALC" HgbA1c:  Lab Results  Component Value Date   HGBA1C 8.4 (H) 07/13/2022   Urine Drug Screen:      Component Value Date/Time   LABOPIA NONE DETECTED 07/13/2022 1405   COCAINSCRNUR NONE DETECTED 07/13/2022 1405   LABBENZ NONE DETECTED 07/13/2022 1405   AMPHETMU NONE DETECTED 07/13/2022 1405   THCU NONE DETECTED 07/13/2022 1405   LABBARB NONE DETECTED 07/13/2022 1405    Alcohol Level     Component Value Date/Time   ETH <10 2023-10-13 1821   INR  Lab Results  Component Value Date   INR 1.3 (H) 13-Oct-2023   APTT  Lab Results  Component Value Date   APTT 28 13-Oct-2023   AED levels:  Lab Results  Component Value Date   LEVETIRACETA 7.1 (L) 07/13/2022    CT Head without contrast(Personally reviewed): unremarkable  CT angio Head and Neck with contrast(Personally reviewed): Chronic L vert occlusion, stable, no acute findings. CTP no perfusion deficit.  ASSESSMENT   73 yo man with hx cirrhosis of the liver with chronic anemia and thrombocytopenia c/b GI bleed, a fib and DVT not on anticoagulation 2/2 aforementioned contraindications, COPD, DM2, HTN, HL, lupus who presents with AMS. Called telestroke but later found out LKW was 1930 yesterday. Nonfocal, CT/CTA/CTP neg, ammonia pending. Favored to be metabolic encephalopathy. Ammonia pending. He has been admitted for hepatic encephalopathy before, and also admitted previously for prolonged post-ictal state, both of which are on the differential. Given nonfocal exam and negative imaging stroke is felt to be unlikely.  RECOMMENDATIONS   - S/p keppra load 2g, continue home dose 1g bid - F/u ammonia level, treat as indicated per medicine team - Infectious and metabolic workup per ED. If no explanation for AMS identified consider MRI brain although suspicion for stroke is very low - rEEG tmrw - No antiplatelets in setting of severe thrombocytopenia - Will continue to follow ______________________________________________________________________    Signed, Jefferson Fuel, MD [facility]

## 2023-10-10 NOTE — ED Notes (Signed)
Dr Niu at bedside 

## 2023-10-10 NOTE — ED Notes (Signed)
Called CCMD for central monitoring

## 2023-10-11 ENCOUNTER — Other Ambulatory Visit: Payer: Self-pay

## 2023-10-11 ENCOUNTER — Inpatient Hospital Stay: Payer: Medicare Other

## 2023-10-11 ENCOUNTER — Ambulatory Visit: Payer: Medicare Other

## 2023-10-11 DIAGNOSIS — D61818 Other pancytopenia: Secondary | ICD-10-CM

## 2023-10-11 DIAGNOSIS — R569 Unspecified convulsions: Secondary | ICD-10-CM

## 2023-10-11 DIAGNOSIS — G40909 Epilepsy, unspecified, not intractable, without status epilepticus: Secondary | ICD-10-CM | POA: Diagnosis not present

## 2023-10-11 DIAGNOSIS — K7682 Hepatic encephalopathy: Secondary | ICD-10-CM

## 2023-10-11 DIAGNOSIS — I5022 Chronic systolic (congestive) heart failure: Secondary | ICD-10-CM

## 2023-10-11 DIAGNOSIS — I480 Paroxysmal atrial fibrillation: Secondary | ICD-10-CM | POA: Diagnosis not present

## 2023-10-11 DIAGNOSIS — E1142 Type 2 diabetes mellitus with diabetic polyneuropathy: Secondary | ICD-10-CM | POA: Diagnosis not present

## 2023-10-11 LAB — CBC
HCT: 32.5 % — ABNORMAL LOW (ref 39.0–52.0)
Hemoglobin: 10.9 g/dL — ABNORMAL LOW (ref 13.0–17.0)
MCH: 30.8 pg (ref 26.0–34.0)
MCHC: 33.5 g/dL (ref 30.0–36.0)
MCV: 91.8 fL (ref 80.0–100.0)
Platelets: 33 10*3/uL — ABNORMAL LOW (ref 150–400)
RBC: 3.54 MIL/uL — ABNORMAL LOW (ref 4.22–5.81)
RDW: 14.4 % (ref 11.5–15.5)
WBC: 2.2 10*3/uL — ABNORMAL LOW (ref 4.0–10.5)
nRBC: 0 % (ref 0.0–0.2)

## 2023-10-11 LAB — COMPREHENSIVE METABOLIC PANEL
ALT: 20 U/L (ref 0–44)
AST: 34 U/L (ref 15–41)
Albumin: 2.9 g/dL — ABNORMAL LOW (ref 3.5–5.0)
Alkaline Phosphatase: 109 U/L (ref 38–126)
Anion gap: 11 (ref 5–15)
BUN: 23 mg/dL (ref 8–23)
CO2: 25 mmol/L (ref 22–32)
Calcium: 8.6 mg/dL — ABNORMAL LOW (ref 8.9–10.3)
Chloride: 105 mmol/L (ref 98–111)
Creatinine, Ser: 0.78 mg/dL (ref 0.61–1.24)
GFR, Estimated: >60 mL/min (ref >60)
Glucose, Bld: 178 mg/dL — ABNORMAL HIGH (ref 70–99)
Potassium: 3.8 mmol/L (ref 3.5–5.1)
Sodium: 141 mmol/L (ref 135–145)
Total Bilirubin: 2.8 mg/dL — ABNORMAL HIGH (ref 0.0–1.2)
Total Protein: 6.6 g/dL (ref 6.5–8.1)

## 2023-10-11 LAB — HIV ANTIBODY (ROUTINE TESTING W REFLEX): HIV Screen 4th Generation wRfx: NONREACTIVE

## 2023-10-11 LAB — CBG MONITORING, ED
Glucose-Capillary: 145 mg/dL — ABNORMAL HIGH (ref 70–99)
Glucose-Capillary: 152 mg/dL — ABNORMAL HIGH (ref 70–99)
Glucose-Capillary: 145 mg/dL — ABNORMAL HIGH (ref 70–99)
Glucose-Capillary: 100 mg/dL — ABNORMAL HIGH (ref 70–99)
Glucose-Capillary: 150 mg/dL — ABNORMAL HIGH (ref 70–99)

## 2023-10-11 LAB — GLUCOSE, CAPILLARY: Glucose-Capillary: 201 mg/dL — ABNORMAL HIGH (ref 70–99)

## 2023-10-11 NOTE — Progress Notes (Signed)
Progress Note   Patient: Lawrence Osborn ZOX:[id number] DOB: 1951/04/19 DOA: 10/10/2023     1 DOS: the patient was seen and examined on 10/11/2023   Brief hospital course: Lawrence Osborn is a 73 y.o. 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  Patient is admitted to the hospitalist service for further management evaluation of acute hepatic encephalopathy.  Assessment and Plan: Acute hepatic encephalopathy: Patient is sleepy, lethargic during my exam. Neurology evaluated the patient recommended EEG, MRI. MRI unremarkable, EEG suggests moderate diffuse encephalopathy, no seizures.  I discussed with neurology, continue medical treatment for encephalopathy. Continue to monitor neurochecks. Continue lactulose therapy. Trend ammonia. Fall, aspiration and seizure precautions.  Seizure disorder Continue home dose Keppra therapy. Seizure precautions. IV Ativan as needed for seizures.  Paroxysmal atrial fibrillation- As patient is n.p.o. will give IV metoprolol as needed for heart rate greater than 125. Hold nadolol and digoxin. Patient is not on anticoagulation due to history of GI bleed.  History of CAD Hyperlipidemia: Continue Zetia therapy.  COPD: No exacerbation, continue home inhalers, albuterol as needed.  Hypothyroidism: Continue IV Synthroid as he is unable to take oral.  Chronic systolic CHF- He presently is euvolemic. Caution with fluid overload. Hold Lasix and spironolactone at this time.  Type 2 diabetes mellitus Continue Accu-Cheks, sliding scale insulin. Hypoglycemia protocol.  Obesity with BMI 32.23. Will do counseling once he is more awake.       Out of bed to chair.  Incentive spirometry. Nursing supportive care. Fall, aspiration precautions. DVT prophylaxis   Code Status: Full Code Subjective: Patient is seen and examined today morning in the emergency department.  He is sleepy and lethargic, unable to provide any history.  Physical Exam: Vitals:   10/11/23 0800 10/11/23 0930 10/11/23 1300 10/11/23 1316  BP: (!) 116/52 (!) 125/58 116/60   Pulse: 65 65 67   Resp: 13 14 15    Temp:    98.1 F (36.7 C)  TempSrc:    Oral  SpO2: 93% 93% 93%   Weight:        General - Elderly Caucasian obese good male, sleeping, no apparent distress HEENT - PERRLA, EOMI, atraumatic head, non tender sinuses. Lung - Clear, basal rales, no rhonchi, wheezes. Heart - S1, S2 heard, no murmurs, rubs, no pedal edema. Abdomen - Soft, non tender, obese, bowel sounds good Neuro -sleepy, drowsy, unable to do full neuro exam. Skin - Warm and dry.  Data Reviewed:      Latest Ref Rng & Units 10/11/2023    4:55 AM 10/10/2023    6:21 PM 07/14/2022    8:25 AM  CBC  WBC 4.0 - 10.5 K/uL 2.2  2.8  4.1   Hemoglobin 13.0 - 17.0 g/dL 81.1  91.4  78.2   Hematocrit 39.0 - 52.0 % 32.5  34.4  42.5   Platelets 150 - 400 K/uL 33  37  48       Latest Ref Rng & Units 10/11/2023    4:55 AM 10/10/2023    6:21 PM 07/14/2022    8:25 AM  BMP  Glucose 70 - 99 mg/dL 956  213  086   BUN 8 - 23 mg/dL 23  23  11    Creatinine 0.61 -  1.24 mg/dL 1.61  0.96  0.45   Sodium 135 - 145 mmol/L 141  139  137   Potassium 3.5 - 5.1 mmol/L 3.8  4.4  4.4   Chloride 98 - 111 mmol/L 105  101  105   CO2 22 - 32 mmol/L 25  24  25    Calcium 8.9 - 10.3 mg/dL 8.6  8.8  8.7    EEG adult Result Date: 10/11/2023 Charlsie Quest, MD     10/11/2023 12:34 PM Patient Name: Lawrence Osborn MRN: [id number] Epilepsy Attending: Charlsie Quest Referring Physician/Provider: Jefferson Fuel, MD Date: 10/11/2023 Duration: 29 mins Patient history: 73 year old male with altered mental status.  EEG to evaluate for seizure. Level  of alertness:  comatose/ lethargic AEDs during EEG study: LEV Technical aspects: This EEG study was done with scalp electrodes positioned according to the 10-20 International system of electrode placement. Electrical activity was reviewed with band pass filter of 1-70Hz , sensitivity of 7 uV/mm, display speed of 69mm/sec with a 60Hz  notched filter applied as appropriate. EEG data were recorded continuously and digitally stored.  Video monitoring was available and reviewed as appropriate. Description: EEG showed continuous generalized 3 to 5 Hz theta-delta slowing.  Generalized periodic discharges with triphasic morphology were noted at 1.5 to 2.5 Hz.  Hyperventilation and photic stimulation were not performed.   ABNORMALITY - Periodic discharges with triphasic morphology, generalized ( GPDs) - Continuous slow, generalized IMPRESSION: This study is suggestive of moderate diffuse encephalopathy.  Generalized periodic discharges with triphasic morphology were noted which can be on the ictal-interictal continuum.  However given the morphology and frequency, they are most likely secondary to toxic-metabolic causes.  No seizures  were seen throughout the recording. Charlsie Quest   MR BRAIN WO CONTRAST Result Date: 10/11/2023 CLINICAL DATA:  Neuro deficit, acute, stroke suspected EXAM: MRI HEAD WITHOUT CONTRAST TECHNIQUE: Multiplanar, multiecho pulse sequences of the brain and surrounding structures were obtained without intravenous contrast. COMPARISON:  MRI head August 09, 2021.  CT head October 10, 2023. FINDINGS: Brain: Remote right frontoparietal infarct. No evidence of acute infarct, acute hemorrhage, mass lesion or midline shift. Patchy T2/FLAIR hyperintensities in the white matter nonspecific but compatible with chronic microvascular ischemic disease. Cerebral atrophy. Vascular: Major arterial flow voids are maintained at the skull base. Skull and upper cervical spine: Normal marrow signal. Sinuses/Orbits:  Clear sinuses.  No acute orbital findings. Other: No mastoid effusions. IMPRESSION: 1. No evidence of acute intracranial abnormality. 2. 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 Electronically Signed   By: Feliberto Harts M.D.   On: 10/11/2023 10:34   CT ANGIO HEAD NECK W WO CM W PERF (CODE STROKE) Result Date: 10/10/2023 CLINICAL DATA:  Aphasia, not following commands EXAM: CT ANGIOGRAPHY HEAD AND NECK CT PERFUSION BRAIN TECHNIQUE: Multidetector CT imaging of the head and neck was performed using the standard protocol during bolus administration of intravenous contrast. Multiplanar CT image reconstructions and MIPs were obtained to evaluate the vascular anatomy. Carotid stenosis measurements (when applicable) are obtained utilizing NASCET criteria, using the distal internal carotid diameter as the denominator. Multiphase CT imaging of the brain was performed following IV bolus contrast injection. Subsequent parametric perfusion maps were calculated using RAPID software. RADIATION DOSE REDUCTION: This exam was performed according to the departmental dose-optimization program which includes automated exposure control, adjustment of the mA and/or kV according to patient size and/or use of iterative reconstruction technique. CONTRAST:  OMNIPAQUE IOHEXOL 350 MG/ML SOLN COMPARISON:  CT  head 10/10/2023, 07/13/2022 CTA head and neck FINDINGS: CT HEAD FINDINGS For noncontrast findings, please see same day CT head. CTA NECK FINDINGS Aortic arch: Standard branching. Imaged portion shows no evidence of aneurysm or dissection. No significant stenosis of the major arch vessel origins. Severe aortic atherosclerosis. Right carotid system: No evidence of dissection, occlusion, or hemodynamically significant stenosis (greater than 50%). Left carotid system: No evidence of dissection, occlusion, or hemodynamically significant stenosis (greater than 50%). Vertebral arteries: The right  vertebral artery is patent from its origin to the skull base, without significant stenosis. 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 Skeleton: No acute osseous abnormality. Degenerative changes in the cervical spine. Other neck: No acute finding. Upper chest: No focal pulmonary opacity or pleural effusion. Review of the MIP images confirms the above findings CTA HEAD FINDINGS Anterior circulation: Both internal carotid arteries are patent to the termini, with moderate stenosis in the proximal left cavernous ICA and mild stenosis in the right cavernous ICA. A1 segments patent. Normal anterior communicating artery. Anterior cerebral arteries are patent to their distal aspects without significant stenosis. No M1 stenosis or occlusion. MCA branches perfused to their distal aspects without significant stenosis. Posterior circulation: Vertebral arteries patent to the vertebrobasilar junction without significant stenosis. Posterior inferior cerebellar arteries patent proximally. Basilar patent to its distal aspect with multifocal irregularity but without significant stenosis. Superior cerebellar arteries patent proximally. Patent, hypoplastic P1 segments. Near fetal origin of the bilateral PCAs from the posterior communicating arteries. PCAs are irregular but perfused to their distal aspects without significant stenosis. Venous sinuses: As permitted by contrast timing, patent. Anatomic variants: Near fetal origin of the PCAs. No evidence of aneurysm or vascular malformation. Review of the MIP images confirms the above findings CT Brain Perfusion Findings: ASPECTS: 10 CBF (<30%) Volume: 0mL Perfusion (Tmax>6.0s) volume: 0mL Mismatch Volume: 0mL Infarction Location:None. IMPRESSION: 1. 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 2. No intracranial large vessel occlusion. Moderate stenosis in the proximal left cavernous ICA and mild stenosis in the right cavernous ICA. 3. No infarct core or penumbra identified on CT perfusion. 4. Aortic atherosclerosis. Aortic Atherosclerosis (ICD10-I70.0). Electronically Signed   By: Wiliam Ke M.D.   On: 10/10/2023 19:02   CT HEAD CODE STROKE WO CONTRAST Result Date: 10/10/2023 CLINICAL DATA:  Code stroke. EXAM: CT HEAD WITHOUT CONTRAST TECHNIQUE: Contiguous axial images were obtained from the base of the skull through the vertex without intravenous contrast. RADIATION DOSE REDUCTION: This exam was performed according to the departmental dose-optimization program which includes automated exposure control, adjustment of the mA and/or kV according to patient size and/or use of iterative reconstruction technique. COMPARISON:  07/13/2022 CT head and MRI head FINDINGS: Brain: No evidence of acute infarction, hemorrhage, mass, mass effect, or midline shift. No hydrocephalus or extra-axial collection. Redemonstrated right frontoparietal encephalomalacia. Advanced cerebral atrophy for age. Vascular: No hyperdense vessel. Skull: Negative for fracture or focal lesion. Sinuses/Orbits: No acute finding. Other: The mastoid air cells are well aerated. ASPECTS [facility] Stroke Program Early CT Score) - Ganglionic level infarction (caudate, lentiform nuclei, internal capsule, insula, M1-M3 cortex): 7 - Supraganglionic infarction (M4-M6 cortex): 3 Total score (0-10 with 10 being  normal): 10 IMPRESSION: No acute intracranial process. ASPECTS is 10. Code stroke imaging results were communicated on 10/10/2023 at 6:30 pm to provider STACK via telephone, who verbally acknowledged these results. Electronically Signed   By: Wiliam Ke M.D.   On: 10/10/2023 18:30     Disposition: Status is: Inpatient Remains inpatient appropriate because: altered mentation.  Planned Discharge Destination: Home with Home Health     Time spent: 37 minutes  Author: Marcelino Duster, MD 10/11/2023 4:03 PM Secure chat 7am to 7pm For on call review [email or web address].

## 2023-10-11 NOTE — ED Notes (Signed)
Bowel management system placed with 50mL water infused balloon. Administered lactulose enema through it and currently clamped. Some leakage has occurred around balloon but appears to be in place currently.

## 2023-10-11 NOTE — ED Notes (Signed)
No dose of Lactulose has been sent up from pharm at this time. Pharm contacted for missing dose.

## 2023-10-11 NOTE — ED Notes (Addendum)
Enema given. Pt brief soiled. Brief changed at this time. Chux pad changed. Pt is lying in bed with eyes open. Pt able to answer some questions but is confused. A&Ox2.

## 2023-10-11 NOTE — ED Notes (Signed)
Contacted Pharm at this time for missing dose of Lactulose.

## 2023-10-11 NOTE — ED Notes (Signed)
Pt brief soiled at this time. Medium soft BM noted at this time. Pt brief, chux pad, and linens changed at this time. Awaiting Enema from Pharmacy.

## 2023-10-11 NOTE — Progress Notes (Signed)
Eeg done

## 2023-10-11 NOTE — Procedures (Signed)
Patient Name: Lawrence Osborn  MRN: [id number]  Epilepsy Attending: Charlsie Quest  Referring Physician/Provider: Jefferson Fuel, MD  Date: 10/11/2023 Duration: 29 mins  Patient history: 73 year old male with altered mental status.  EEG to evaluate for seizure.  Level of alertness:  comatose/ lethargic   AEDs during EEG study: LEV  Technical aspects: This EEG study was done with scalp electrodes positioned according to the 10-20 International system of electrode placement. Electrical activity was reviewed with band pass filter of 1-70Hz , sensitivity of 7 uV/mm, display speed of 8mm/sec with a 60Hz  notched filter applied as appropriate. EEG data were recorded continuously and digitally stored.  Video monitoring was available and reviewed as appropriate.  Description: EEG showed continuous generalized 3 to 5 Hz theta-delta slowing.  Generalized periodic discharges with triphasic morphology were noted at 1.5 to 2.5 Hz.  Hyperventilation and photic stimulation were not performed.     ABNORMALITY - Periodic discharges with triphasic morphology, generalized ( GPDs) - Continuous slow, generalized  IMPRESSION: This study is suggestive of moderate diffuse encephalopathy.  Generalized periodic discharges with triphasic morphology were noted which can be on the ictal-interictal continuum.  However given the morphology and frequency, they are most likely secondary to toxic-metabolic causes.  No seizures  were seen throughout the recording.  Luxe Cuadros Annabelle Harman

## 2023-10-11 NOTE — ED Notes (Signed)
Pt brief and chux changed at this time from lack of holding enema. Peri care preformed.

## 2023-10-11 NOTE — ED Notes (Signed)
Pt returned form MRI.  ?

## 2023-10-11 NOTE — Progress Notes (Signed)
Chart review progress note Patient seen as a telestroke yesterday by Dr. Selina Cooley. Presentation likely consistent with hepatic encephalopathy MRI brain ordered to ensure no stroke was missed-MRI brain negative for acute process. EEG completed-triphasic waves likely from hepatic encephalopathy Ammonia was elevated. Would recommend medical management of hyperammonemia per primary team Inpatient neurology will be available as needed Please call with questions Plan relayed to Dr. Sidney Ace  -- Milon Dikes, MD Neurologist [facility]

## 2023-10-11 NOTE — ED Notes (Signed)
Patient's bowel system came out after unclamping. Patient cleaned and new sheets, gown, male purewick placed onto patient for comfort and to prevent further soiling. Patient reconnected to monitor, VSS, CCM in use, call light within reach.

## 2023-10-11 NOTE — ED Notes (Signed)
Pt in MRI.

## 2023-10-12 DIAGNOSIS — I480 Paroxysmal atrial fibrillation: Secondary | ICD-10-CM | POA: Diagnosis not present

## 2023-10-12 DIAGNOSIS — K7682 Hepatic encephalopathy: Secondary | ICD-10-CM | POA: Diagnosis not present

## 2023-10-12 DIAGNOSIS — E039 Hypothyroidism, unspecified: Secondary | ICD-10-CM | POA: Diagnosis not present

## 2023-10-12 DIAGNOSIS — G40909 Epilepsy, unspecified, not intractable, without status epilepticus: Secondary | ICD-10-CM | POA: Diagnosis not present

## 2023-10-12 LAB — CBC
HCT: 33.5 % — ABNORMAL LOW (ref 39.0–52.0)
Hemoglobin: 11.3 g/dL — ABNORMAL LOW (ref 13.0–17.0)
MCH: 30.4 pg (ref 26.0–34.0)
MCHC: 33.7 g/dL (ref 30.0–36.0)
MCV: 90.1 fL (ref 80.0–100.0)
Platelets: 37 10*3/uL — ABNORMAL LOW (ref 150–400)
RBC: 3.72 MIL/uL — ABNORMAL LOW (ref 4.22–5.81)
RDW: 14.4 % (ref 11.5–15.5)
WBC: 2.3 10*3/uL — ABNORMAL LOW (ref 4.0–10.5)
nRBC: 0 % (ref 0.0–0.2)

## 2023-10-12 LAB — CBG MONITORING, ED
Glucose-Capillary: 118 mg/dL — ABNORMAL HIGH (ref 70–99)
Glucose-Capillary: 217 mg/dL — ABNORMAL HIGH (ref 70–99)

## 2023-10-12 LAB — URINE DRUG SCREEN, QUALITATIVE (ARMC ONLY)
Amphetamines, Ur Screen: NOT DETECTED
Barbiturates, Ur Screen: NOT DETECTED
Benzodiazepine, Ur Scrn: POSITIVE — AB
Cannabinoid 50 Ng, Ur ~~LOC~~: NOT DETECTED
Cocaine Metabolite,Ur ~~LOC~~: NOT DETECTED
MDMA (Ecstasy)Ur Screen: NOT DETECTED
Methadone Scn, Ur: NOT DETECTED
Opiate, Ur Screen: NOT DETECTED
Phencyclidine (PCP) Ur S: NOT DETECTED
Tricyclic, Ur Screen: POSITIVE — AB

## 2023-10-12 LAB — AMMONIA: Ammonia: 47 umol/L — ABNORMAL HIGH (ref 9–35)

## 2023-10-12 MED ORDER — VITAMIN B-12 1000 MCG PO TABS
2500.0000 ug | ORAL_TABLET | Freq: Every day | ORAL | Status: DC
  Administered 2023-10-12: 2500 ug via ORAL
  Filled 2023-10-12: qty 2.5

## 2023-10-12 MED ORDER — LEVOTHYROXINE SODIUM 50 MCG PO TABS
100.0000 ug | ORAL_TABLET | Freq: Every day | ORAL | Status: DC
  Administered 2023-10-12: 100 ug via ORAL
  Filled 2023-10-12: qty 2

## 2023-10-12 MED ORDER — SPIRONOLACTONE 25 MG PO TABS
50.0000 mg | ORAL_TABLET | Freq: Every day | ORAL | Status: DC
  Administered 2023-10-12: 50 mg via ORAL
  Filled 2023-10-12 (×2): qty 2

## 2023-10-12 MED ORDER — LACTULOSE 10 GM/15ML PO SOLN: 20.0000 g | Freq: Three times a day (TID) | ORAL | 2 refills | Status: AC

## 2023-10-12 MED ORDER — PANTOPRAZOLE SODIUM 40 MG PO TBEC
40.0000 mg | DELAYED_RELEASE_TABLET | Freq: Every day | ORAL | Status: DC
  Administered 2023-10-12: 40 mg via ORAL
  Filled 2023-10-12: qty 1

## 2023-10-12 MED ORDER — FERROUS GLUCONATE 324 (38 FE) MG PO TABS
324.0000 mg | ORAL_TABLET | Freq: Every day | ORAL | Status: DC
Start: 2023-10-12 — End: 2023-10-12
  Administered 2023-10-12: 324 mg via ORAL
  Filled 2023-10-12: qty 1

## 2023-10-12 MED ORDER — LACTULOSE 10 GM/15ML PO SOLN
20.0000 g | Freq: Three times a day (TID) | ORAL | Status: DC
  Administered 2023-10-12: 20 g via ORAL
  Filled 2023-10-12: qty 30

## 2023-10-12 MED ORDER — LEVETIRACETAM 1000 MG PO TABS: 1000.0000 mg | ORAL_TABLET | Freq: Two times a day (BID) | ORAL | 1 refills | Status: AC

## 2023-10-12 NOTE — ED Notes (Signed)
Patient's bed alarm ringing. Patient attempting to get out of bed on his own. This RN and Durene Cal, RN entered room, repositioned patient in bed, placed him back onto monitor. VSS, CCM in use, call light within reach. Patient reminded not to get up on his own, bed alarm back on.

## 2023-10-12 NOTE — ED Notes (Signed)
Pt ringing call bell at this time for the bathroom. Pt able to stand and walk to toilet with some assist from this RN. Gait slightly unsteady but pt is able to correct.

## 2023-10-12 NOTE — ED Notes (Signed)
Pt given phone at this time to call wife.

## 2023-10-12 NOTE — ED Notes (Signed)
Pt alert in bed. Pt able to answer questions and ask for needs. Pt is A&O x4.

## 2023-10-12 NOTE — ED Notes (Signed)
Messaged pharm for missing dose of Sipro. Pyxis is out of medication.

## 2023-10-12 NOTE — Discharge Summary (Signed)
Physician Discharge Summary   Patient: Lawrence Osborn MRN: [id number] DOB: 12/05/1950  Admit date:     10/10/2023  Discharge date: 10/12/23  Discharge Physician: Marcelino Duster   PCP: Danella Penton, MD   Recommendations at discharge:  {Tip this will not be part of the note when signed- Example include specific recommendations for outpatient follow-up, pending tests to follow-up on. (Optional):26781}  PCP follow up in 1 week  Discharge Diagnoses: Principal Problem:   Hepatic encephalopathy (HCC) Active Problems:   Seizure disorder (HCC)   PAF (paroxysmal atrial fibrillation) (HCC)   HLD (hyperlipidemia)   CAD (coronary artery disease)   COPD (chronic obstructive pulmonary disease) (HCC)   Hypothyroidism   Type 2 diabetes mellitus with peripheral neuropathy (HCC)   Chronic systolic CHF (congestive heart failure) (HCC)   Pancytopenia (HCC)   Obesity (BMI 30-39.9)  Resolved Problems:   * No resolved hospital problems. [facility] Course: No notes on file  Assessment and Plan: No notes have been filed under this hospital service. Service: Hospitalist     {Tip this will not be part of the note when signed Body mass index is 32.23 kg/m. , ,  (Optional):26781}  {(NOTE) Pain control PDMP Statment (Optional):26782} Consultants: *** Procedures performed: ***  Disposition: {Plan; Disposition:26390} Diet recommendation:  Discharge Diet Orders (From admission, onward)     Start     Ordered   10/12/23 0000  Diet - low sodium heart healthy        10/12/23 1433   10/12/23 0000  Diet Carb Modified        10/12/23 1433           {Diet_Plan:26776} DISCHARGE MEDICATION: Allergies as of 10/12/2023   No Known Allergies      Medication List     STOP taking these medications    ezetimibe 10 MG tablet Commonly known as: ZETIA   midodrine 10 MG tablet Commonly known as: PROAMATINE       TAKE these medications    albuterol 108 (90 Base) MCG/ACT  inhaler Commonly known as: VENTOLIN HFA Inhale 2 puffs into the lungs every 6 (six) hours as needed for wheezing or shortness of breath.   B-12 2500 MCG Tabs Take 2,500 mcg by mouth daily.   digoxin 0.25 MG tablet Commonly known as: LANOXIN Take 1 tablet (0.25 mg total) by mouth daily. Due follow up visit.  PLEASE CALL OFFICE TO SCHEDULE APPOINTMENT PRIOR TO NEXT REFILL   docusate sodium 100 MG capsule Commonly known as: COLACE Take 100 mg by mouth 2 (two) times daily.   ferrous gluconate 324 MG tablet Commonly known as: FERGON Take 324 mg by mouth daily with breakfast.   furosemide 20 MG tablet Commonly known as: LASIX Take 2 tablets (40 mg total) by mouth 2 (two) times daily.   insulin glargine 100 UNIT/ML injection Commonly known as: LANTUS Inject 50 Units into the skin 2 (two) times daily.   lactulose 10 GM/15ML solution Commonly known as: CHRONULAC Take 30 mLs (20 g total) by mouth 3 (three) times daily. What changed: when to take this   levETIRAcetam 1000 MG tablet Commonly known as: Keppra Take 1 tablet (1,000 mg total) by mouth 2 (two) times daily.   levothyroxine 100 MCG tablet Commonly known as: SYNTHROID Take 100 mcg by mouth daily before breakfast.   nadolol 20 MG tablet Commonly known as: CORGARD Take 20 mg by mouth daily.   omega-3 fish oil 1000 MG Caps capsule Commonly known as:  MAXEPA Take 1,000 mg by mouth 2 (two) times daily.   omeprazole 40 MG capsule Commonly known as: PRILOSEC Take 40 mg by mouth daily.   QUEtiapine 100 MG tablet Commonly known as: SEROQUEL Take 0.5 tablets (50 mg total) by mouth at bedtime. Take 100 mg in the morning and 200 mg at bedtime What changed:  how much to take when to take this additional instructions   spironolactone 50 MG tablet Commonly known as: ALDACTONE Take 50 mg by mouth daily.        Discharge Exam: Filed Weights   10/10/23 1901  Weight: 96.2 kg   ***  Condition at discharge: {DC  Condition:26389}  The results of significant diagnostics from this hospitalization (including imaging, microbiology, ancillary and laboratory) are listed below for reference.   Imaging Studies: EEG adult Result Date: 10/11/2023 Charlsie Quest, MD     10/11/2023 12:34 PM Patient Name: Lawrence Osborn MRN: [id number] Epilepsy Attending: Charlsie Quest Referring Physician/Provider: Jefferson Fuel, MD Date: 10/11/2023 Duration: 29 mins Patient history: 73 year old male with altered mental status.  EEG to evaluate for seizure. Level of alertness:  comatose/ lethargic AEDs during EEG study: LEV Technical aspects: This EEG study was done with scalp electrodes positioned according to the 10-20 International system of electrode placement. Electrical activity was reviewed with band pass filter of 1-70Hz , sensitivity of 7 uV/mm, display speed of 44mm/sec with a 60Hz  notched filter applied as appropriate. EEG data were recorded continuously and digitally stored.  Video monitoring was available and reviewed as appropriate. Description: EEG showed continuous generalized 3 to 5 Hz theta-delta slowing.  Generalized periodic discharges with triphasic morphology were noted at 1.5 to 2.5 Hz.  Hyperventilation and photic stimulation were not performed.   ABNORMALITY - Periodic discharges with triphasic morphology, generalized ( GPDs) - Continuous slow, generalized IMPRESSION: This study is suggestive of moderate diffuse encephalopathy.  Generalized periodic discharges with triphasic morphology were noted which can be on the ictal-interictal continuum.  However given the morphology and frequency, they are most likely secondary to toxic-metabolic causes.  No seizures  were seen throughout the recording. Charlsie Quest   MR BRAIN WO CONTRAST Result Date: 10/11/2023 CLINICAL DATA:  Neuro deficit, acute, stroke suspected EXAM: MRI HEAD WITHOUT CONTRAST TECHNIQUE: Multiplanar, multiecho pulse sequences of the brain and  surrounding structures were obtained without intravenous contrast. COMPARISON:  MRI head August 09, 2021.  CT head October 10, 2023. FINDINGS: Brain: Remote right frontoparietal infarct. No evidence of acute infarct, acute hemorrhage, mass lesion or midline shift. Patchy T2/FLAIR hyperintensities in the white matter nonspecific but compatible with chronic microvascular ischemic disease. Cerebral atrophy. Vascular: Major arterial flow voids are maintained at the skull base. Skull and upper cervical spine: Normal marrow signal. Sinuses/Orbits: Clear sinuses.  No acute orbital findings. Other: No mastoid effusions. IMPRESSION: 1. No evidence of acute intracranial abnormality. 2. 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 Electronically Signed   By: Feliberto Harts M.D.   On: 10/11/2023 10:34   CT ANGIO HEAD NECK W WO CM W PERF (CODE STROKE) Result Date: 10/10/2023 CLINICAL DATA:  Aphasia, not following commands EXAM: CT ANGIOGRAPHY HEAD AND NECK CT PERFUSION BRAIN TECHNIQUE: Multidetector CT imaging of the head and neck was performed using the standard protocol during bolus administration of intravenous contrast. Multiplanar CT image reconstructions and MIPs were obtained to evaluate the vascular anatomy. Carotid stenosis measurements (when applicable) are obtained utilizing NASCET criteria, using the distal internal carotid diameter as the  denominator. Multiphase CT imaging of the brain was performed following IV bolus contrast injection. Subsequent parametric perfusion maps were calculated using RAPID software. RADIATION DOSE REDUCTION: This exam was performed according to the departmental dose-optimization program which includes automated exposure control, adjustment of the mA and/or kV according to patient size and/or use of iterative reconstruction technique. CONTRAST:  OMNIPAQUE IOHEXOL 350 MG/ML SOLN COMPARISON:  CT head 10/10/2023, 07/13/2022 CTA head and neck  FINDINGS: CT HEAD FINDINGS For noncontrast findings, please see same day CT head. CTA NECK FINDINGS Aortic arch: Standard branching. Imaged portion shows no evidence of aneurysm or dissection. No significant stenosis of the major arch vessel origins. Severe aortic atherosclerosis. Right carotid system: No evidence of dissection, occlusion, or hemodynamically significant stenosis (greater than 50%). Left carotid system: No evidence of dissection, occlusion, or hemodynamically significant stenosis (greater than 50%). Vertebral arteries: The right vertebral artery is patent from its origin to the skull base, without significant stenosis. 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 Skeleton: No acute osseous abnormality. Degenerative changes in the cervical spine. Other neck: No acute finding. Upper chest: No focal pulmonary opacity or pleural effusion. Review of the MIP images confirms the above findings CTA HEAD FINDINGS Anterior circulation: Both internal carotid arteries are patent to the termini, with moderate stenosis in the proximal left cavernous ICA and mild stenosis in the right cavernous ICA. A1 segments patent. Normal anterior communicating artery. Anterior cerebral arteries are patent to their distal aspects without significant stenosis. No M1 stenosis or occlusion. MCA branches perfused to their distal aspects without significant stenosis. Posterior circulation: Vertebral arteries patent to the vertebrobasilar junction without significant stenosis. Posterior inferior cerebellar arteries patent proximally. Basilar patent to its distal aspect with multifocal irregularity but without significant stenosis. Superior cerebellar arteries patent proximally. Patent, hypoplastic P1 segments. Near fetal origin of the bilateral PCAs from the  posterior communicating arteries. PCAs are irregular but perfused to their distal aspects without significant stenosis. Venous sinuses: As permitted by contrast timing, patent. Anatomic variants: Near fetal origin of the PCAs. No evidence of aneurysm or vascular malformation. Review of the MIP images confirms the above findings CT Brain Perfusion Findings: ASPECTS: 10 CBF (<30%) Volume: 0mL Perfusion (Tmax>6.0s) volume: 0mL Mismatch Volume: 0mL Infarction Location:None. IMPRESSION: 1. 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 2. No intracranial large vessel occlusion. Moderate stenosis in the proximal left cavernous ICA and mild stenosis in the right cavernous ICA. 3. No infarct core or penumbra identified on CT perfusion. 4. Aortic atherosclerosis. Aortic Atherosclerosis (ICD10-I70.0). Electronically Signed   By: Wiliam Ke M.D.   On: 10/10/2023 19:02   CT HEAD CODE STROKE WO CONTRAST Result Date: 10/10/2023 CLINICAL DATA:  Code stroke. EXAM: CT HEAD WITHOUT CONTRAST TECHNIQUE: Contiguous axial images were obtained from the base of the skull through the vertex without intravenous contrast. RADIATION DOSE REDUCTION: This exam was performed according to the departmental dose-optimization program which includes automated exposure control, adjustment of the mA and/or kV according to patient size and/or use of iterative reconstruction technique. COMPARISON:  07/13/2022 CT head and MRI head FINDINGS: Brain: No evidence of acute infarction, hemorrhage, mass, mass effect, or midline shift. No  hydrocephalus or extra-axial collection. Redemonstrated right frontoparietal encephalomalacia. Advanced cerebral atrophy for age. Vascular: No hyperdense vessel. Skull: Negative for fracture or focal lesion. Sinuses/Orbits: No acute finding. Other: The mastoid  air cells are well aerated. ASPECTS [facility] Stroke Program Early CT Score) - Ganglionic level infarction (caudate, lentiform nuclei, internal capsule, insula, M1-M3 cortex): 7 - Supraganglionic infarction (M4-M6 cortex): 3 Total score (0-10 with 10 being normal): 10 IMPRESSION: No acute intracranial process. ASPECTS is 10. Code stroke imaging results were communicated on 10/10/2023 at 6:30 pm to provider STACK via telephone, who verbally acknowledged these results. Electronically Signed   By: Wiliam Ke M.D.   On: 10/10/2023 18:30    Microbiology: Results for orders placed or performed during the hospital encounter of 06/23/21  Resp Panel by RT-PCR (Flu A&B, Covid) Nasopharyngeal Swab     Status: None   Collection Time: 06/23/21  8:05 PM   Specimen: Nasopharyngeal Swab; Nasopharyngeal(NP) swabs in vial transport medium  Result Value Ref Range Status   SARS Coronavirus 2 by RT PCR NEGATIVE NEGATIVE Final    Comment: (NOTE) SARS-CoV-2 target nucleic acids are NOT DETECTED.  The SARS-CoV-2 RNA is generally detectable in upper respiratory specimens during the acute phase of infection. The lowest concentration of SARS-CoV-2 viral copies this assay can detect is 138 copies/mL. A negative result does not preclude SARS-Cov-2 infection and should not be used as the sole basis for treatment or other patient management decisions. A negative result may occur with  improper specimen collection/handling, submission of specimen other than nasopharyngeal swab, presence of viral mutation(s) within the areas targeted by this assay, and inadequate number of viral copies(<138 copies/mL). A negative result must be combined with clinical observations, patient history, and epidemiological information. The expected result is Negative.  Fact Sheet for Patients:  BloggerCourse.com  Fact Sheet for Healthcare Providers:  SeriousBroker.it  This test is no t yet  approved or cleared by the [place] FDA and  has been authorized for detection and/or diagnosis of SARS-CoV-2 by FDA under an Emergency Use Authorization (EUA). This EUA will remain  in effect (meaning this test can be used) for the duration of the COVID-19 declaration under Section 564(b)(1) of the Act, 21 U.S.C.section 360bbb-3(b)(1), unless the authorization is terminated  or revoked sooner.       Influenza A by PCR NEGATIVE NEGATIVE Final   Influenza B by PCR NEGATIVE NEGATIVE Final    Comment: (NOTE) The Xpert Xpress SARS-CoV-2/FLU/RSV plus assay is intended as an aid in the diagnosis of influenza from Nasopharyngeal swab specimens and should not be used as a sole basis for treatment. Nasal washings and aspirates are unacceptable for Xpert Xpress SARS-CoV-2/FLU/RSV testing.  Fact Sheet for Patients: BloggerCourse.com  Fact Sheet for Healthcare Providers: SeriousBroker.it  This test is not yet approved or cleared by the [place] FDA and has been authorized for detection and/or diagnosis of SARS-CoV-2 by FDA under an Emergency Use Authorization (EUA). This EUA will remain in effect (meaning this test can be used) for the duration of the COVID-19 declaration under Section 564(b)(1) of the Act, 21 U.S.C. section 360bbb-3(b)(1), unless the authorization is terminated or revoked.  Performed at [facility], [address] [place], [place] [address]     Labs: CBC: Recent Labs  Lab 10/10/23 1821 10/11/23 0455 10/12/23 0320  WBC 2.8* 2.2* 2.3*  NEUTROABS 2.4  --   --   HGB 11.8* 10.9* 11.3*  HCT 34.4* 32.5* 33.5*  MCV 91.2 91.8 90.1  PLT 37* 33* 37*   Basic Metabolic Panel: Recent Labs  Lab 10/10/23 1821 10/11/23 0455  NA 139 141  K 4.4 3.8  CL 101 105  CO2 24 25  GLUCOSE 217* 178*  BUN 23 23  CREATININE 0.85 0.78  CALCIUM 8.8* 8.6*   Liver Function Tests: Recent Labs  Lab  10/10/23 1821 10/11/23 0455  AST 44* 34  ALT 20 20  ALKPHOS 134* 109  BILITOT 2.7* 2.8*  PROT 7.0 6.6  ALBUMIN 3.1* 2.9*   CBG: Recent Labs  Lab 10/11/23 1704 10/11/23 2111 10/11/23 2114 10/12/23 0717 10/12/23 1253  GLUCAP 145* 100* 150* 118* 217*    Discharge time spent: 36 minutes.  Signed: Marcelino Duster, MD [facility] 10/12/2023

## 2023-10-12 NOTE — ED Notes (Signed)
Pt ringing call bell. Upon entering pt is asking for help with cutting his chicken from meal tray. Chicken cut into bite sized pieces. Pt eating at this time.

## 2023-10-12 NOTE — ED Notes (Signed)
Notified Jon Billings, NP of patient's request for home med Seroquel to help him rest as he stated he's been unable to sleep all night. Messaged via secure chat, stated at this time due to patient's diagnosis and initial presentation they were unable to add it back to medications.

## 2023-10-12 NOTE — Progress Notes (Signed)
OT Cancellation Note  Patient Details Name: Lawrence Osborn MRN: [id number] DOB: 05/02/1951   Cancelled Treatment:    Reason Eval/Treat Not Completed: Other (comment). Pt unable to be located in ED for OT evaluation. Per RN, pt discharged home.   Lottie Mussel 10/12/2023, 3:42 PM

## 2023-10-14 LAB — LEVETIRACETAM LEVEL: Levetiracetam Lvl: <2 ug/mL — ABNORMAL LOW (ref 10.0–40.0)

## 2024-01-14 ENCOUNTER — Encounter: Payer: Self-pay | Admitting: Cardiovascular Disease

## 2024-01-14 ENCOUNTER — Ambulatory Visit: Payer: Medicare Other | Attending: Cardiovascular Disease | Admitting: Cardiovascular Disease

## 2024-01-14 VITALS — BP 114/56 | HR 68 | Ht 68.0 in | Wt 204.0 lb

## 2024-01-14 DIAGNOSIS — I5022 Chronic systolic (congestive) heart failure: Secondary | ICD-10-CM | POA: Diagnosis not present

## 2024-01-14 DIAGNOSIS — I4892 Unspecified atrial flutter: Secondary | ICD-10-CM | POA: Diagnosis not present

## 2024-01-14 DIAGNOSIS — Z87898 Personal history of other specified conditions: Secondary | ICD-10-CM

## 2024-01-14 DIAGNOSIS — R011 Cardiac murmur, unspecified: Secondary | ICD-10-CM

## 2024-01-14 NOTE — Patient Instructions (Signed)
 Medication Instructions:  STOP the Digoxin    *If you need a refill on your cardiac medications before your next appointment, please call your pharmacy*  Lab Work: None ordered If you have labs (blood work) drawn today and your tests are completely normal, you will receive your results only by: MyChart Message (if you have MyChart) OR A paper copy in the mail If you have any lab test that is abnormal or we need to change your treatment, we will call you to review the results.  Testing/Procedures: None ordered  Follow-Up: At [facility], you and your health needs are our priority.  As part of our continuing mission to provide you with exceptional heart care, our providers are all part of one team.  This team includes your primary Cardiologist (physician) and Advanced Practice Providers or APPs (Physician Assistants and Nurse Practitioners) who all work together to provide you with the care you need, when you need it.  Your next appointment:   6 month(s)  Provider:   You may see Dr. Alvenia Aus or one of the following Advanced Practice Providers on your designated Care Team:   Laneta Pintos, NP Gildardo Labrador, PA-C Varney Gentleman, PA-C Cadence [place], PA-C Ronald Cockayne, NP Morey Ar, NP    We recommend signing up for the patient portal called "MyChart".  Sign up information is provided on this After Visit Summary.  MyChart is used to connect with patients for Virtual Visits (Telemedicine).  Patients are able to view lab/test results, encounter notes, upcoming appointments, etc.  Non-urgent messages can be sent to your provider as well.   To learn more about what you can do with MyChart, go to [email or web address].

## 2024-01-14 NOTE — Progress Notes (Unsigned)
 Cardiology Office Note   Date:  01/15/2024   ID:  Lawrence Osborn, DOB [birth year], MRN [id number]  PCP:  Sari Cunning, MD  Cardiologist:   Antionette Kirks, MD   No chief complaint on file.     History of Present Illness: Lawrence Osborn is a 73 y.o. male who presents for follow-up visit regarding atrial flutter/fibrillation.  He has known history of type 2 diabetes, previous bilateral pulmonary embolism, hypothyroidism, alcoholic liver cirrhosis and hyperlipidemia. He was diagnosed with atrial fibrillation in 2019 in the setting of alcohol intoxication.  Echocardiogram showed normal LV systolic function.  He was hospitalized in September 2022 due to recurrent syncopal episodes due to suspected orthostatic hypotension and tachycardia.  He was hypertensive and tachycardic on presentation.  CTA was negative for pulmonary embolism.  His syncope was felt to be due to hypotension in the setting of suboptimally controlled ventricular rate.  He was treated with rate control.  Digoxin  was added.  Echocardiogram showed an ejection fraction of 45 to 50%, moderately dilated right atrium mildly reduced RV systolic function with normal pulmonary pressure. Subsequent Lexiscan  Myoview  done in October 2022 showed no evidence of ischemia with normal ejection fraction. He quit drinking in 2022. He was hospitalized at [facility] in December, 2022 after an abnormal EEG and possible seizure activity.  He was thought to have toxic metabolic encephalopathy in the setting of decompensated liver disease and noncompliance with lactulose .  Eliquis  was discontinued due to platelet count of less than 50,000. He had multiple hospitalizations for hepatic encephalopathy most recently in January of this year.  He has been stable from a cardiac standpoint with no chest pain or palpitations.  He has stable exertional dyspnea.   Past Medical History:  Diagnosis Date   Anemia    Cirrhosis of liver (HCC)    COPD (chronic obstructive  pulmonary disease) (HCC)    Diabetes mellitus without complication (HCC)    DVT (deep venous thrombosis) (HCC)    mid calf to groin   Dysrhythmia    atrial fibrillation   GI bleed    HLD (hyperlipidemia)    Hypertension    Hypothyroidism    Lupus (systemic lupus erythematosus) (HCC)    Peritonitis (HCC)    Pneumothorax    Syncope 08/30/2018   Thoracic radiculopathy 07/16/2016   Toe infection     Past Surgical History:  Procedure Laterality Date   APPENDECTOMY     COLONOSCOPY     DORSAL SLIT N/A 09/20/2020   Procedure: DORSAL SLIT;  Surgeon: Geraline Knapp, MD;  Location: [facility] ORS;  Service: Urology;  Laterality: N/A;   ESOPHAGOGASTRODUODENOSCOPY (EGD) WITH PROPOFOL  N/A 01/31/2022   Procedure: ESOPHAGOGASTRODUODENOSCOPY (EGD) WITH PROPOFOL ;  Surgeon: Toledo, Alphonsus Jeans, MD;  Location: [facility] ENDOSCOPY;  Service: Gastroenterology;  Laterality: N/A;  IDDM   HERNIA REPAIR       Current Outpatient Medications  Medication Sig Dispense Refill   albuterol  (PROVENTIL  HFA;VENTOLIN  HFA) 108 (90 Base) MCG/ACT inhaler Inhale 2 puffs into the lungs every 6 (six) hours as needed for wheezing or shortness of breath.     Cyanocobalamin  (B-12) 2500 MCG TABS Take 2,500 mcg by mouth daily.     ferrous gluconate  (FERGON) 324 MG tablet Take 324 mg by mouth daily with breakfast.     furosemide  (LASIX ) 20 MG tablet Take 2 tablets (40 mg total) by mouth 2 (two) times daily. 60 tablet 0   insulin  glargine (LANTUS ) 100 UNIT/ML injection Inject 50 Units into the skin  2 (two) times daily.     lactulose  (CHRONULAC ) 10 GM/15ML solution Take 30 mLs (20 g total) by mouth 3 (three) times daily. 236 mL 2   levETIRAcetam  (KEPPRA ) 1000 MG tablet Take 1 tablet (1,000 mg total) by mouth 2 (two) times daily. 60 tablet 1   levothyroxine  (SYNTHROID , LEVOTHROID) 100 MCG tablet Take 100 mcg by mouth daily before breakfast.     nadolol  (CORGARD ) 20 MG tablet Take 20 mg by mouth daily.     omega-3 fish oil  (MAXEPA) 1000 MG  CAPS capsule Take 1,000 mg by mouth 2 (two) times daily.     omeprazole (PRILOSEC) 40 MG capsule Take 40 mg by mouth daily.     QUEtiapine  (SEROQUEL ) 100 MG tablet Take 0.5 tablets (50 mg total) by mouth at bedtime. Take 100 mg in the morning and 200 mg at bedtime (Patient taking differently: Take 100-200 mg by mouth 2 (two) times daily. Take 100 mg every morning and 200 mg at bedtime)     spironolactone  (ALDACTONE ) 50 MG tablet Take 50 mg by mouth daily.     No current facility-administered medications for this visit.    Allergies:   Patient has no known allergies.    Social History:  The patient  reports that he has quit smoking. His smoking use included cigarettes. He has never used smokeless tobacco. He reports that he does not currently use alcohol after a past usage of about 3.0 standard drinks of alcohol per week. He reports that he does not currently use drugs.   Family History:  The patient's family history includes CAD in his father; Hypothyroidism in his mother.    ROS:  Please see the history of present illness.   Otherwise, review of systems are positive for none.   All other systems are reviewed and negative.    PHYSICAL EXAM: VS:  BP (!) 114/56   Pulse 68   Ht 5\' 8"  (1.727 m)   Wt 204 lb (92.5 kg)   SpO2 95%   BMI 31.02 kg/m  , BMI Body mass index is 31.02 kg/m. GEN: Well nourished, well developed, in no acute distress  HEENT: normal  Neck: no JVD, carotid bruits, or masses Cardiac: Regular rate and rhythm; no rubs, or gallops, mild bilateral leg edema.  2 out of 6 systolic murmur in the aortic area Respiratory:  clear to auscultation bilaterally, normal work of breathing GI: soft, nontender, nondistended, + BS MS: no deformity or atrophy  Skin: warm and dry, no rash Neuro:  Strength and sensation are intact Psych: euthymic mood, full affect   EKG:  EKG is ordered today. The ekg ordered today demonstrates : Normal sinus rhythm Right bundle branch block Left  anterior fascicular block Bifascicular block    Recent Labs: 10/10/2023: B Natriuretic Peptide 109.7 10/11/2023: ALT 20; BUN 23; Creatinine, Ser 0.78; Potassium 3.8; Sodium 141 10/12/2023: Hemoglobin 11.3; Platelets 37    Lipid Panel No results found for: "CHOL", "TRIG", "HDL", "CHOLHDL", "VLDL", "LDLCALC", "LDLDIRECT"    Wt Readings from Last 3 Encounters:  01/14/24 204 lb (92.5 kg)  10/10/23 212 lb (96.2 kg)  11/30/22 226 lb 8 oz (102.7 kg)           No data to display            ASSESSMENT AND PLAN:  1.  Atrial flutter: He continues to be in sinus rhythm.  I elected to discontinue digoxin .  Continue nadolol .  He is not a candidate for anticoagulation  given persistent thrombocytopenia and underlying liver cirrhosis.  Most recent platelet count was 50,000.  2.  Chronic systolic heart failure with mildly reduced ejection fraction: He appears to be euvolemic on spironolactone  50 mg daily and furosemide  40 mg twice daily.  3.  History of syncope: Possibly due to seizures in the setting of toxic encephalopathy but he did have an episode that was highly suggestive of orthostatic hypotension.  4.  Liver cirrhosis: Stable at the present time.  He does not always use lactulose  as prescribed.  I discussed the importance of this.  5.  Cardiac murmur suggestive of mild aortic stenosis: Will continue to monitor clinically for now.    Disposition:   FU in 6 months.  Signed,  Antionette Kirks, MD  01/15/2024 8:40 AM    [facility]

## 2024-09-29 ENCOUNTER — Ambulatory Visit: Attending: Cardiovascular Disease | Admitting: Cardiovascular Disease

## 2024-09-29 VITALS — BP 98/52 | HR 76 | Ht 68.0 in | Wt 235.4 lb

## 2024-09-29 DIAGNOSIS — R011 Cardiac murmur, unspecified: Secondary | ICD-10-CM

## 2024-09-29 DIAGNOSIS — I480 Paroxysmal atrial fibrillation: Secondary | ICD-10-CM | POA: Diagnosis not present

## 2024-09-29 DIAGNOSIS — I4892 Unspecified atrial flutter: Secondary | ICD-10-CM | POA: Diagnosis not present

## 2024-09-29 DIAGNOSIS — I5022 Chronic systolic (congestive) heart failure: Secondary | ICD-10-CM | POA: Diagnosis not present

## 2024-09-29 NOTE — Patient Instructions (Signed)

## 2024-09-29 NOTE — Progress Notes (Signed)
 "    Cardiology Office Note   Date:  09/29/2024   ID:  Lawrence Osborn, DOB 08-13-1951, MRN [id number]  PCP:  Cleotilde Oneil FALCON, MD  Cardiologist:   Deatrice Cage, MD   Chief Complaint  Patient presents with   Follow-up      History of Present Illness: Lawrence Osborn is a 74 y.o. male who presents for follow-up visit regarding atrial flutter/fibrillation.  He has known history of type 2 diabetes, previous bilateral pulmonary embolism, hypothyroidism, alcoholic liver cirrhosis and hyperlipidemia. He was diagnosed with atrial fibrillation in 2019 in the setting of alcohol intoxication.  Echocardiogram showed normal LV systolic function.  He was hospitalized in September 2022 due to recurrent syncopal episodes due to suspected orthostatic hypotension and tachycardia.  He was hypertensive and tachycardic on presentation.  CTA was negative for pulmonary embolism.  His syncope was felt to be due to hypotension in the setting of suboptimally controlled ventricular rate.  He was treated with rate control.  Digoxin  was added.  Echocardiogram showed an ejection fraction of 45 to 50%, moderately dilated right atrium mildly reduced RV systolic function with normal pulmonary pressure. Subsequent Lexiscan  Myoview  done in October 2022 showed no evidence of ischemia with normal ejection fraction. He quit drinking in 2022. He was hospitalized at [facility] in December, 2022 after an abnormal EEG and possible seizure activity.  He was thought to have toxic metabolic encephalopathy in the setting of decompensated liver disease and noncompliance with lactulose .  Eliquis  was discontinued due to platelet count of less than 50,000. He had multiple hospitalizations for hepatic encephalopathy most recently in January of 2025.  He has been stable from a cardiac standpoint with no chest pain or palpitations.  He has stable exertional dyspnea.   Past Medical History:  Diagnosis Date   Anemia    Cirrhosis of liver (HCC)    COPD  (chronic obstructive pulmonary disease) (HCC)    Diabetes mellitus without complication (HCC)    DVT (deep venous thrombosis) (HCC)    mid calf to groin   Dysrhythmia    atrial fibrillation   GI bleed    HLD (hyperlipidemia)    Hypertension    Hypothyroidism    Lupus (systemic lupus erythematosus) (HCC)    Peritonitis (HCC)    Pneumothorax    Syncope 08/30/2018   Thoracic radiculopathy 07/16/2016   Toe infection     Past Surgical History:  Procedure Laterality Date   APPENDECTOMY     COLONOSCOPY     DORSAL SLIT N/A 09/20/2020   Procedure: DORSAL SLIT;  Surgeon: Twylla Glendia BROCKS, MD;  Location: [facility] ORS;  Service: Urology;  Laterality: N/A;   ESOPHAGOGASTRODUODENOSCOPY (EGD) WITH PROPOFOL  N/A 01/31/2022   Procedure: ESOPHAGOGASTRODUODENOSCOPY (EGD) WITH PROPOFOL ;  Surgeon: Toledo, Ladell POUR, MD;  Location: [facility] ENDOSCOPY;  Service: Gastroenterology;  Laterality: N/A;  IDDM   HERNIA REPAIR       Current Outpatient Medications  Medication Sig Dispense Refill   albuterol  (PROVENTIL  HFA;VENTOLIN  HFA) 108 (90 Base) MCG/ACT inhaler Inhale 2 puffs into the lungs every 6 (six) hours as needed for wheezing or shortness of breath.     Cyanocobalamin  (B-12) 2500 MCG TABS Take 2,500 mcg by mouth daily.     insulin  glargine (LANTUS ) 100 UNIT/ML injection Inject 50 Units into the skin 2 (two) times daily.     nadolol  (CORGARD ) 20 MG tablet Take 20 mg by mouth daily.     omega-3 fish oil  (MAXEPA) 1000 MG CAPS capsule Take 1,000 mg  by mouth 2 (two) times daily.     omeprazole (PRILOSEC) 40 MG capsule Take 40 mg by mouth daily.     phenylephrine  (SUDAFED PE) 10 MG TABS tablet Take 10 mg by mouth every 4 (four) hours as needed.     QUEtiapine  (SEROQUEL ) 100 MG tablet Take 0.5 tablets (50 mg total) by mouth at bedtime. Take 100 mg in the morning and 200 mg at bedtime (Patient taking differently: Take 100-200 mg by mouth 2 (two) times daily. Take 100 mg every morning and 200 mg at bedtime)      spironolactone  (ALDACTONE ) 50 MG tablet Take 50 mg by mouth daily.     ferrous gluconate  (FERGON) 324 MG tablet Take 324 mg by mouth daily with breakfast. (Patient not taking: Reported on 09/29/2024)     furosemide  (LASIX ) 20 MG tablet Take 2 tablets (40 mg total) by mouth 2 (two) times daily. (Patient not taking: Reported on 09/29/2024) 60 tablet 0   lactulose  (CHRONULAC ) 10 GM/15ML solution Take 30 mLs (20 g total) by mouth 3 (three) times daily. (Patient not taking: Reported on 09/29/2024) 236 mL 2   levETIRAcetam  (KEPPRA ) 1000 MG tablet Take 1 tablet (1,000 mg total) by mouth 2 (two) times daily. (Patient not taking: Reported on 09/29/2024) 60 tablet 1   levothyroxine  (SYNTHROID , LEVOTHROID) 100 MCG tablet Take 100 mcg by mouth daily before breakfast. (Patient not taking: Reported on 09/29/2024)     No current facility-administered medications for this visit.    Allergies:   Patient has no known allergies.    Social History:  The patient  reports that he has quit smoking. His smoking use included cigarettes. He has never used smokeless tobacco. He reports that he does not currently use alcohol after a past usage of about 3.0 standard drinks of alcohol per week. He reports that he does not currently use drugs.   Family History:  The patient's family history includes CAD in his father; Hypothyroidism in his mother.    ROS:  Please see the history of present illness.   Otherwise, review of systems are positive for none.   All other systems are reviewed and negative.    PHYSICAL EXAM: VS:  BP (!) 98/52 (BP Location: Left Arm, Patient Position: Sitting, Cuff Size: Normal)   Pulse 76   Ht 5' 8 (1.727 m)   Wt 235 lb 6.4 oz (106.8 kg)   SpO2 97%   BMI 35.79 kg/m  , BMI Body mass index is 35.79 kg/m. GEN: Well nourished, well developed, in no acute distress  HEENT: normal  Neck: no JVD, carotid bruits, or masses Cardiac: Regular rate and rhythm; no rubs, or gallops, mild bilateral leg edema.   2 /6 systolic murmur in the aortic area Respiratory:  clear to auscultation bilaterally, normal work of breathing GI: soft, nontender, nondistended, + BS MS: no deformity or atrophy  Skin: warm and dry, no rash Neuro:  Strength and sensation are intact Psych: euthymic mood, full affect   EKG:  EKG is ordered today. The ekg ordered today demonstrates : Normal sinus rhythm Right bundle branch block Left anterior fascicular block Bifascicular block    Recent Labs: 10/10/2023: B Natriuretic Peptide 109.7 10/11/2023: ALT 20; BUN 23; Creatinine, Ser 0.78; Potassium 3.8; Sodium 141 10/12/2023: Hemoglobin 11.3; Platelets 37    Lipid Panel No results found for: CHOL, TRIG, HDL, CHOLHDL, VLDL, LDLCALC, LDLDIRECT    Wt Readings from Last 3 Encounters:  09/29/24 235 lb 6.4 oz (106.8 kg)  01/14/24 204 lb (92.5 kg)  10/10/23 212 lb (96.2 kg)           No data to display            ASSESSMENT AND PLAN:  1.  Atrial flutter: He continues to be in sinus rhythm. Continue nadolol .  He is not a candidate for anticoagulation given persistent thrombocytopenia and underlying liver cirrhosis.  Most recent platelet count was 44,000.  2.  Chronic systolic heart failure with mildly reduced ejection fraction: He appears to be euvolemic on spironolactone  50 mg daily and furosemide  40 mg twice daily.  3.  History of syncope: Possibly due to seizures in the setting of toxic encephalopathy but he did have an episode that was highly suggestive of orthostatic hypotension.  4.  Liver cirrhosis: Stable at the present time.  Most recent INR was 1.3.  5.  Cardiac murmur suggestive of mild aortic stenosis: Will consider a follow-up echocardiogram later this year.    Disposition:   FU in 6 months.  Signed,  Deatrice Cage, MD  09/29/2024 3:52 PM    [facility] "

## 2024-11-03 ENCOUNTER — Ambulatory Visit: Admitting: Cardiovascular Disease

## 2025-03-02 ENCOUNTER — Ambulatory Visit: Admitting: Cardiovascular Disease
